# Patient Record
Sex: Female | Born: 1960 | Race: Black or African American | Hispanic: No | Marital: Single | Smoking: Never smoker
Health system: Southern US, Community
[De-identification: ages and names within clinical notes are randomized; demographics above are authoritative.]

## PROBLEM LIST (undated history)

## (undated) DIAGNOSIS — R011 Cardiac murmur, unspecified: Secondary | ICD-10-CM

## (undated) DIAGNOSIS — E785 Hyperlipidemia, unspecified: Secondary | ICD-10-CM

## (undated) DIAGNOSIS — J189 Pneumonia, unspecified organism: Secondary | ICD-10-CM

## (undated) DIAGNOSIS — I639 Cerebral infarction, unspecified: Secondary | ICD-10-CM

## (undated) DIAGNOSIS — I10 Essential (primary) hypertension: Secondary | ICD-10-CM

## (undated) DIAGNOSIS — K449 Diaphragmatic hernia without obstruction or gangrene: Secondary | ICD-10-CM

## (undated) DIAGNOSIS — K219 Gastro-esophageal reflux disease without esophagitis: Secondary | ICD-10-CM

## (undated) DIAGNOSIS — M1990 Unspecified osteoarthritis, unspecified site: Secondary | ICD-10-CM

## (undated) DIAGNOSIS — G959 Disease of spinal cord, unspecified: Secondary | ICD-10-CM

## (undated) DIAGNOSIS — G459 Transient cerebral ischemic attack, unspecified: Secondary | ICD-10-CM

## (undated) DIAGNOSIS — Z8489 Family history of other specified conditions: Secondary | ICD-10-CM

## (undated) DIAGNOSIS — E669 Obesity, unspecified: Secondary | ICD-10-CM

## (undated) DIAGNOSIS — Z8673 Personal history of transient ischemic attack (TIA), and cerebral infarction without residual deficits: Secondary | ICD-10-CM

## (undated) DIAGNOSIS — E039 Hypothyroidism, unspecified: Secondary | ICD-10-CM

## (undated) DIAGNOSIS — E282 Polycystic ovarian syndrome: Secondary | ICD-10-CM

## (undated) DIAGNOSIS — H269 Unspecified cataract: Secondary | ICD-10-CM

## (undated) DIAGNOSIS — D649 Anemia, unspecified: Secondary | ICD-10-CM

## (undated) DIAGNOSIS — F419 Anxiety disorder, unspecified: Secondary | ICD-10-CM

## (undated) HISTORY — DX: Hyperlipidemia, unspecified: E78.5

## (undated) HISTORY — DX: Cardiac murmur, unspecified: R01.1

## (undated) HISTORY — PX: DIAGNOSTIC LAPAROSCOPY: SUR761

## (undated) HISTORY — DX: Transient cerebral ischemic attack, unspecified: G45.9

## (undated) HISTORY — DX: Essential (primary) hypertension: I10

## (undated) HISTORY — PX: FRACTURE SURGERY: SHX138

## (undated) HISTORY — DX: Polycystic ovarian syndrome: E28.2

## (undated) HISTORY — DX: Personal history of transient ischemic attack (TIA), and cerebral infarction without residual deficits: Z86.73

## (undated) HISTORY — DX: Obesity, unspecified: E66.9

## (undated) HISTORY — DX: Unspecified cataract: H26.9

---
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
## 1990-12-10 DIAGNOSIS — Z8673 Personal history of transient ischemic attack (TIA), and cerebral infarction without residual deficits: Secondary | ICD-10-CM

## 1990-12-10 HISTORY — DX: Personal history of transient ischemic attack (TIA), and cerebral infarction without residual deficits: Z86.73

## 1998-01-23 ENCOUNTER — Inpatient Hospital Stay (HOSPITAL_COMMUNITY): Admission: AD | Admit: 1998-01-23 | Discharge: 1998-01-27 | Payer: Self-pay | Admitting: Obstetrics and Gynecology

## 1998-02-01 ENCOUNTER — Inpatient Hospital Stay (HOSPITAL_COMMUNITY): Admission: AD | Admit: 1998-02-01 | Discharge: 1998-02-01 | Payer: Self-pay | Admitting: Obstetrics and Gynecology

## 1998-02-07 ENCOUNTER — Inpatient Hospital Stay (HOSPITAL_COMMUNITY): Admission: AD | Admit: 1998-02-07 | Discharge: 1998-02-07 | Payer: Self-pay | Admitting: Obstetrics & Gynecology

## 1998-02-14 ENCOUNTER — Inpatient Hospital Stay (HOSPITAL_COMMUNITY): Admission: AD | Admit: 1998-02-14 | Discharge: 1998-02-17 | Payer: Self-pay | Admitting: Obstetrics and Gynecology

## 1998-02-19 ENCOUNTER — Encounter (HOSPITAL_COMMUNITY): Admission: RE | Admit: 1998-02-19 | Discharge: 1998-05-20 | Payer: Self-pay | Admitting: Obstetrics and Gynecology

## 1998-07-26 ENCOUNTER — Other Ambulatory Visit: Admission: RE | Admit: 1998-07-26 | Discharge: 1998-07-26 | Payer: Self-pay | Admitting: Obstetrics and Gynecology

## 1999-02-02 ENCOUNTER — Encounter: Admission: RE | Admit: 1999-02-02 | Discharge: 1999-05-03 | Payer: Self-pay | Admitting: Family Medicine

## 2000-12-17 ENCOUNTER — Emergency Department (HOSPITAL_COMMUNITY): Admission: EM | Admit: 2000-12-17 | Discharge: 2000-12-17 | Payer: Self-pay | Admitting: Emergency Medicine

## 2002-08-19 ENCOUNTER — Other Ambulatory Visit: Admission: RE | Admit: 2002-08-19 | Discharge: 2002-08-19 | Payer: Self-pay | Admitting: Family Medicine

## 2002-12-06 ENCOUNTER — Encounter: Payer: Self-pay | Admitting: Emergency Medicine

## 2002-12-06 ENCOUNTER — Emergency Department (HOSPITAL_COMMUNITY): Admission: EM | Admit: 2002-12-06 | Discharge: 2002-12-06 | Payer: Self-pay | Admitting: Emergency Medicine

## 2003-08-27 ENCOUNTER — Other Ambulatory Visit: Admission: RE | Admit: 2003-08-27 | Discharge: 2003-08-27 | Payer: Self-pay | Admitting: Family Medicine

## 2003-11-01 ENCOUNTER — Ambulatory Visit (HOSPITAL_BASED_OUTPATIENT_CLINIC_OR_DEPARTMENT_OTHER): Admission: RE | Admit: 2003-11-01 | Discharge: 2003-11-01 | Payer: Self-pay | Admitting: Otolaryngology

## 2003-11-01 ENCOUNTER — Encounter (INDEPENDENT_AMBULATORY_CARE_PROVIDER_SITE_OTHER): Payer: Self-pay | Admitting: *Deleted

## 2003-11-01 ENCOUNTER — Ambulatory Visit (HOSPITAL_COMMUNITY): Admission: RE | Admit: 2003-11-01 | Discharge: 2003-11-01 | Payer: Self-pay | Admitting: Otolaryngology

## 2003-12-11 HISTORY — PX: KNEE CARTILAGE SURGERY: SHX688

## 2004-05-02 ENCOUNTER — Ambulatory Visit (HOSPITAL_COMMUNITY): Admission: RE | Admit: 2004-05-02 | Discharge: 2004-05-02 | Payer: Self-pay | Admitting: Orthopedic Surgery

## 2004-07-03 ENCOUNTER — Ambulatory Visit (HOSPITAL_BASED_OUTPATIENT_CLINIC_OR_DEPARTMENT_OTHER): Admission: RE | Admit: 2004-07-03 | Discharge: 2004-07-03 | Payer: Self-pay | Admitting: Orthopedic Surgery

## 2004-10-18 ENCOUNTER — Encounter: Admission: RE | Admit: 2004-10-18 | Discharge: 2004-10-18 | Payer: Self-pay | Admitting: Family Medicine

## 2004-11-22 ENCOUNTER — Other Ambulatory Visit: Admission: RE | Admit: 2004-11-22 | Discharge: 2004-11-22 | Payer: Self-pay | Admitting: Family Medicine

## 2005-08-21 ENCOUNTER — Emergency Department (HOSPITAL_COMMUNITY): Admission: EM | Admit: 2005-08-21 | Discharge: 2005-08-21 | Payer: Self-pay | Admitting: Emergency Medicine

## 2005-08-24 ENCOUNTER — Encounter (HOSPITAL_COMMUNITY): Admission: RE | Admit: 2005-08-24 | Discharge: 2005-11-22 | Payer: Self-pay | Admitting: Family Medicine

## 2005-12-10 HISTORY — PX: TONSILLECTOMY: SUR1361

## 2006-09-29 ENCOUNTER — Emergency Department (HOSPITAL_COMMUNITY): Admission: EM | Admit: 2006-09-29 | Discharge: 2006-09-29 | Payer: Self-pay | Admitting: *Deleted

## 2006-09-30 ENCOUNTER — Ambulatory Visit (HOSPITAL_BASED_OUTPATIENT_CLINIC_OR_DEPARTMENT_OTHER): Admission: RE | Admit: 2006-09-30 | Discharge: 2006-09-30 | Payer: Self-pay | Admitting: Orthopedic Surgery

## 2006-12-10 HISTORY — PX: FEMUR FRACTURE SURGERY: SHX633

## 2007-12-11 HISTORY — PX: ABDOMINAL HYSTERECTOMY: SHX81

## 2008-02-04 ENCOUNTER — Encounter (INDEPENDENT_AMBULATORY_CARE_PROVIDER_SITE_OTHER): Payer: Self-pay | Admitting: Gynecology

## 2008-02-04 ENCOUNTER — Inpatient Hospital Stay (HOSPITAL_COMMUNITY): Admission: RE | Admit: 2008-02-04 | Discharge: 2008-02-05 | Payer: Self-pay | Admitting: Gynecology

## 2008-02-12 ENCOUNTER — Encounter: Admission: RE | Admit: 2008-02-12 | Discharge: 2008-02-12 | Payer: Self-pay | Admitting: Gynecology

## 2008-02-19 ENCOUNTER — Encounter: Admission: RE | Admit: 2008-02-19 | Discharge: 2008-02-19 | Payer: Self-pay | Admitting: Gynecology

## 2010-08-23 ENCOUNTER — Encounter: Admission: RE | Admit: 2010-08-23 | Discharge: 2010-08-23 | Payer: Self-pay | Admitting: Family Medicine

## 2010-12-31 ENCOUNTER — Encounter: Payer: Self-pay | Admitting: Family Medicine

## 2011-02-09 ENCOUNTER — Ambulatory Visit (INDEPENDENT_AMBULATORY_CARE_PROVIDER_SITE_OTHER): Payer: Medicare FFS | Admitting: Cardiology

## 2011-02-09 DIAGNOSIS — I119 Hypertensive heart disease without heart failure: Secondary | ICD-10-CM

## 2011-02-09 DIAGNOSIS — E7800 Pure hypercholesterolemia, unspecified: Secondary | ICD-10-CM

## 2011-02-09 DIAGNOSIS — R079 Chest pain, unspecified: Secondary | ICD-10-CM

## 2011-02-20 ENCOUNTER — Encounter: Payer: Self-pay | Admitting: Cardiology

## 2011-02-20 ENCOUNTER — Ambulatory Visit (HOSPITAL_COMMUNITY): Payer: Managed Care, Other (non HMO) | Attending: Cardiovascular Disease

## 2011-02-20 ENCOUNTER — Telehealth (INDEPENDENT_AMBULATORY_CARE_PROVIDER_SITE_OTHER): Payer: Self-pay | Admitting: *Deleted

## 2011-02-20 DIAGNOSIS — R072 Precordial pain: Secondary | ICD-10-CM

## 2011-02-20 DIAGNOSIS — E785 Hyperlipidemia, unspecified: Secondary | ICD-10-CM | POA: Insufficient documentation

## 2011-02-20 DIAGNOSIS — R079 Chest pain, unspecified: Secondary | ICD-10-CM | POA: Insufficient documentation

## 2011-02-20 DIAGNOSIS — E039 Hypothyroidism, unspecified: Secondary | ICD-10-CM | POA: Insufficient documentation

## 2011-02-20 DIAGNOSIS — I10 Essential (primary) hypertension: Secondary | ICD-10-CM | POA: Insufficient documentation

## 2011-02-20 DIAGNOSIS — E669 Obesity, unspecified: Secondary | ICD-10-CM | POA: Insufficient documentation

## 2011-02-21 ENCOUNTER — Encounter: Payer: Self-pay | Admitting: Cardiology

## 2011-02-21 ENCOUNTER — Ambulatory Visit (HOSPITAL_COMMUNITY): Payer: Managed Care, Other (non HMO) | Attending: Cardiology

## 2011-02-21 DIAGNOSIS — R079 Chest pain, unspecified: Secondary | ICD-10-CM | POA: Insufficient documentation

## 2011-02-21 DIAGNOSIS — R0789 Other chest pain: Secondary | ICD-10-CM

## 2011-02-21 DIAGNOSIS — R0609 Other forms of dyspnea: Secondary | ICD-10-CM

## 2011-02-27 NOTE — Progress Notes (Signed)
Summary: Nuclear Pre-Procedure  Phone Note Outgoing Call Call back at [facility] Phone [phone]   Call placed by: Stanton Kidney, EMT-P,  February 20, 2011 3:19 PM Action Taken: Phone Call Completed Summary of Call: Left message with information on Myov[employer] Information Sheet (see scanned document for details). Stanton Kidney, EMT-P  February 20, 2011 3:19 PM      Nuclear Med Background Indications for Stress Test: Evaluation for Ischemia   History: Asthma, Myocardial Perfusion Study  History Comments: 10/06 MPS: NL, EF= 62%  Symptoms: Chest Pressure, Chest Tightness    Nuclear Pre-Procedure Cardiac Risk Factors: Family History - CAD, Hypertension, Lipids, Obesity

## 2011-02-28 ENCOUNTER — Ambulatory Visit (HOSPITAL_COMMUNITY): Payer: Managed Care, Other (non HMO) | Attending: Cardiology | Admitting: Radiology

## 2011-02-28 DIAGNOSIS — R0789 Other chest pain: Secondary | ICD-10-CM

## 2011-03-07 ENCOUNTER — Telehealth: Payer: Self-pay | Admitting: Cardiology

## 2011-03-07 NOTE — Telephone Encounter (Signed)
RETURNING YOUR CALL AND STILL HAS QUESTIONS, ALSO WANTS A COPY OF THE REPORT

## 2011-03-07 NOTE — Telephone Encounter (Signed)
Returned call and advised of results 

## 2011-03-08 NOTE — Assessment & Plan Note (Signed)
Summary: Cardiology Nuclear Testing  Nuclear Med Background Indications for Stress Test: Evaluation for Ischemia   History: Asthma, Myocardial Perfusion Study  History Comments: 10/06 MPS: NL, EF= 62%  Symptoms: Chest Pain, Chest Pain with Exertion, Chest Pressure, Chest Tightness, Chest Tightness with Exertion, Diaphoresis, DOE, Fatigue, Fatigue with Exertion, Light-Headedness, Palpitations  Symptoms Comments: Continuous chest pain left upper chest x 1 month, mild per patient.   Nuclear Pre-Procedure Cardiac Risk Factors: Family History - CAD, Hypertension, Lipids, Obesity, TIA Caffeine/Decaff Intake: None NPO After: 8:00 AM Lungs: Clear IV 0.9% NS with Angio Cath: 22g     IV Site: R Hand IV Started by: Irean Hong, RN Chest Size (in) 40     Cup Size C     Height (in): 62 Weight (lb): 242 BMI: 44.42  Nuclear Med Study 1 or 2 day study:  2 day     Stress Test Type:  Treadmill/Lexiscan Reading MD:  Willa Rough, MD     Referring MD:  Cassell Clement Resting Radionuclide:  Technetium 89m Tetrofosmin     Resting Radionuclide Dose:  33 mCi  Stress Radionuclide:  Technetium 34m Tetrofosmin     Stress Radionuclide Dose:  33 mCi   Stress Protocol      Max HR:  126 bpm     Predicted Max HR:  172 bpm  Max Systolic BP: 136 mm Hg     Percent Max HR:  73.26 %Rate Pressure Product:  16109  Lexiscan: 0.4 mg   Stress Test Technologist:  Irean Hong,  RN     Nuclear Technologist:  Doyne Keel, CNMT  Rest Procedure  Myocardial perfusion imaging was performed at rest 45 minutes following the intravenous administration of Technetium 58m Tetrofosmin.  Stress Procedure  The patient received IV Lexiscan 0.4 mg over 15-seconds with concurrent low level exercise and then Technetium 48m Tetrofosmin was injected at 30-seconds while the patient continued walking one more minute.  The EKG was nondiagnostic due to baseline T wave changes. The patient has been having continuous left upper chest  pain x 1 month. Quantitative spect images were obtained after a 45 minute delay.  QPS Raw Data Images:  Patient motion noted; appropriate software correction applied. Stress Images:  Normal homogeneous uptake in all areas of the myocardium. Rest Images:  Normal homogeneous uptake in all areas of the myocardium. Subtraction (SDS):  No evidence of ischemia. Transient Ischemic Dilatation:  1.05  (Normal <1.22)  Lung/Heart Ratio:  0.42  (Normal <0.45)  Quantitative Gated Spect Images QGS EDV:  82 ml QGS ESV:  28 ml QGS EF:  66 % QGS cine images:  Normal motion  Findings Normal nuclear study      Overall Impression  Exercise Capacity: Lexiscan with low level exercise BP Response: Normal blood pressure response. Clinical Symptoms: blurred vision ECG Impression: No significant ST segment change suggestive of ischemia. Overall Impression: Normal stress nuclear study.

## 2011-03-28 ENCOUNTER — Encounter: Payer: Managed Care, Other (non HMO) | Attending: Family Medicine | Admitting: *Deleted

## 2011-03-28 DIAGNOSIS — Z713 Dietary counseling and surveillance: Secondary | ICD-10-CM | POA: Insufficient documentation

## 2011-03-28 DIAGNOSIS — E669 Obesity, unspecified: Secondary | ICD-10-CM | POA: Insufficient documentation

## 2011-04-10 ENCOUNTER — Ambulatory Visit: Payer: Managed Care, Other (non HMO) | Admitting: *Deleted

## 2011-04-24 NOTE — H&P (Signed)
[name], [name] NO.:  [id number]   MEDICAL RECORD NO.:  [id number]          PATIENT TYPE:  INP   LOCATION:  0007                         FACILITY:  [facility]   PHYSICIAN:  Gretta Cool, M.D. DATE OF BIRTH:  03/08/1961   DATE OF ADMISSION:  02/04/2008  DATE OF DISCHARGE:                              HISTORY & PHYSICAL   CHIEF COMPLAINT:  Abnormal bleeding secondary to fibroids.   HISTORY OF PRESENT ILLNESS:  A 50 year old gravida 4, para 1, AB 3 with  a history of one Cesarean section delivery. Over the last six months she  has had increasingly severe abnormal uterine bleeding. Her cycles are  occurring every two to three weeks and lasting seven days. The second  and third days are extremely heavy, changing every hour, pads and  tampons to control. She has had progressive anemia, has difficulty  keeping up with her blood count from blood loss. On ultrasound exam, she  has enormous uterine leiomyomata involving the endometrial cavity  greatly distorting the endometrium. The process is sufficient to prevent  consideration of conservative measures such as hysteroscopy. She is now  admitted for definitive therapy by supercervical hysterectomy. She has  current Pap within normal limits.   PAST MEDICAL HISTORY:  She has a life-long history of obesity with BMI  now greater than 45. She has a remote history of mitral prolapse and  history of some event thought possibly to represent a mini-stroke when  she was on hormonal birth control. She has had testing for coagulopathy  with no abnormal findings. She has metabolic syndrome and PCO and has a  well-documented impaired glucose tolerance with elevated A1C. She is  followed by Dr. Talmage Nap.   She has previous surgery with T and A in 2004, knee surgery 2005,  history of Cesarean section delivery with her only child 1998 by Dr.  Stefano Gaul.   She has allergies to ASPIRIN and PENICILLIN.   CURRENT MEDICATIONS:  1.  Lipitor 10 mg.  2. Synthroid 0.125 mg daily.  3. Paxil 20 mg daily.  4. HCTZ 25 q.o.d.  5. Albuterol p.r.n.   She denies ethanol or tobacco, denies recreational drugs.   REVIEW OF SYMPTOMS:  HEENT:  Denies symptoms. CARDIORESPIRATORY:  Denies  asthma. Has occasional episodes of asthma and bronchospasm on Albuterol.  GI/GU:  Denies frequency, urgency, dysuria, change in bowel habits, food  intolerance.   PHYSICAL EXAMINATION:  GENERAL:  A massively obese short black female.  HEENT:  Pupils equal, reactive to light and accommodate. Fundi not  examined. Oropharynx clear.  NECK:  Supple without mass, thyroid enlargement.  CHEST:  Clear P to A.  HEART:  Regular rhythm with audible mitral snap and mitral murmur.  BREASTS:  Without masses, nodes, nipple discharge.  ABDOMEN:  Protuberant with large panniculus with very high abdomen to  hip ratio.  PELVIC:  External genitalia normal female. Vagina clean, rugose. Cervix  is nulliparous, clean. Uterus is approximately 12 weeks size. Adnexa  clear.  RECTOVAGINAL:  Confirms.  EXTREMITIES:  Negative.  NEUROLOGIC:  Physiologic.   IMPRESSIONS:  1. Uterine leiomyomata with abnormal uterine bleeding and anemia,      unresponsive to conservative therapy.  2. Polycystic ovaries (PCO) with impaired glucose tolerance, metabolic      syndrome.  3. Hypothyroid autoimmune.  4. History of mitral valve prolapse.  5. Morbid obesity, obesity 3.  6. History of Cesarean section delivery with pregnancy-induced      hypertension by Dr. Stefano Gaul.   PLAN:  Supercervical hysterectomy, possible salpingo-oophorectomy.           ______________________________  Gretta Cool, M.D.     CWL/MEDQ  D:  02/03/2008  T:  02/04/2008  Job:  [id number]   cc:   Dorisann Frames, M.D.  Fax: [phone]   Talmadge Coventry, M.D.  Fax: [phone]

## 2011-04-24 NOTE — Op Note (Signed)
Alison [name], Alison [name] NO.:  [id number]   MEDICAL RECORD NO.:  [id number]          PATIENT TYPE:  INP   LOCATION:  1527                         FACILITY:  [facility]   PHYSICIAN:  Gretta Cool, M.D. DATE OF BIRTH:  1961/01/18   DATE OF PROCEDURE:  02/04/2008  DATE OF DISCHARGE:                               OPERATIVE REPORT   PREOPERATIVE DIAGNOSES:  1. Abnormal uterine bleeding, increasingly severe with anemia and      uterine enlargement compatible with fibroids versus adenomyosis.  2. BMI 45 with morbid obesity.  3. Previous cesarean section.   POSTOPERATIVE DIAGNOSES:  1. Abnormal uterine bleeding, increasingly severe with anemia and      uterine enlargement compatible with fibroids versus adenomyosis.  2. BMI 45 with morbid obesity.  3. Previous cesarean section.   PROCEDURE:  Laparotomy, lysis of extensive adhesions, supracervical  hysterectomy.   SURGEON:  Beather Arbour, M.D.   ASSISTANT:  Dr. Jeanine Luz.   ANESTHESIA:  General orotracheal.   DESCRIPTION OF PROCEDURE:  Under excellent anesthesia as above with the  patient prepped and draped in lithotomy position, a vertical skin  incision was made above the abdominal panniculus roll.  She has a 6 cm  deep apron of fat over the previous Pfannenstiel incision making an  approach in that same incision and extremely risk one.  She had a  vertical incision well above the occlusive panniculus.  The incision was  extended through the subcutaneous tissue to the fascia.  The fascia was  then opened in the midline and the peritoneum was then opened and the  abdomen explored.  There was a large amount of adhesion of omentum to  the anterior abdominal wall.  Omental adhesions were lysed by blunt and  sharp dissection.  Once the pelvis could be identified and the pelvic  organs mobilized, significant adhesions were noted of the bladder to the  anterior peritoneal surface.  Those adhesions were lysed.  At this  point  retractors were placed and the bowel was packed away.  With the patient  in deep Trendelenburg position, the uterus was elevated with Kelly  clamps across the adnexal structures.  The round ligaments were then  transected and the bladder flap opened and the bladder pushed off the  lower uterine segment.  The ovarian ligaments were then clamped on each  side with Masterson clamps, cut, sutured and ligated with 0-0 Vicryl.  They were then sutured with 0-0 Vicryl.  At this point the uterine  vessels were skeletonized and clamped.  The size of the uterus made and  further surgical efforts difficult because of the obscuring uterine  fundus.  The uterus was morcellated and the upper portion of the uterus  removed to allow adequate visualization.  At this point the uterine  vessels were clamped, cut, sutured and tied with 0-0 Vicryl.  The upper  portion of the cardinal ligaments was then also clamped, cut, sutured  and tied with 0-0 Vicryl.  The cervix was then excised in conical  fashion so as to remove the endocervical canal tissue  and eliminate any  residual endometrial tissue that might cause persistence of bleeding.  At this point the cervix was approximated with running suture of 0-0  Vicryl from each angle transversely.  The pelvic floor was then  reperitonealized with running suture of #2-0 Monocryl.  At this point  the pelvis was irrigated and all debris removed.  The packs and  retractors removed.  The abdominal peritoneum was then closed with  running suture of #0-0 PDS.  The fascia was then approximated with a  running mattress suture of 0-0 PDS from each angle and tied to the like  kind suture in the midline.  At this point the subcutaneous tissue was  approximated at [facility] fascia and the skin closed with skin staples and  Steri-Strips.  At the end of the procedure, sponge and lap counts were  correct.  There were no complications.  Patient returned to recovery  room in  excellent condition.           ______________________________  Gretta Cool, M.D.     CWL/MEDQ  D:  02/04/2008  T:  02/05/2008  Job:  [id number]   cc:   Almedia Balls. Randell Patient, M.D.  Fax: [phone]   Talmadge Coventry, M.D.  Fax: [phone]   Dorisann Frames, M.D.  Fax: [phone]

## 2011-04-26 ENCOUNTER — Ambulatory Visit: Payer: Managed Care, Other (non HMO) | Admitting: *Deleted

## 2011-04-27 NOTE — Discharge Summary (Signed)
NAMEDENELL, COTHERN NO.:  [id number]   MEDICAL RECORD NO.:  [id number]          PATIENT TYPE:  INP   LOCATION:  1527                         [facility]AC[facility]:  [facility]   PHYSICIAN:  Gretta Cool, M.D. DATE OF BIRTH:  1961-07-18   DATE OF ADMISSION:  02/04/2008  DATE OF DISCHARGE:  02/05/2008                               DISCHARGE SUMMARY   HISTORY OF PRESENT ILLNESS:  Ms. Pryce is a 50 year old gravida 4, para  1, AB 3 female who has a history of one cesarean section delivery.  She  reports that over the past 6 months she has had increasingly severe  abnormal uterine bleeding with her cycles occurring every 2-3 weeks  lasting 7 days.  She describes the bleeding as heavy, especially on the  second and third days, changing tampons or pads every hour.  She has had  progressive anemia.  On ultrasound examination, she has large uterine  leiomyomata involving the endometrial cavity which distorts the  endometrium.  She is now admitted for definitive therapy by  supracervical hysterectomy.  Past medical history is significant for  obesity with BMI greater than 45.  She has a remote history of mitral  valve prolapse and thought possibly to represent a mini stroke when on  hormonal birth control.  Coagulopathy testing found no abnormal results.  She has metabolic syndrome and PCO.  She has well-documented impaired  glucose tolerance with an elevated hemoglobin A1c.  She sees Dr. Talmage Nap  for this diagnosis.   PAST SURGERIES:  Include tonsillectomy and adenoidectomy in 2002, knee  surgery in 2005, the C-section delivery of her only child in 1998.   ALLERGIES:  To ASPIRIN and PENICILLIN.   MEDICATIONS:  See history and physical for current medications.   ADMISSION EXAM:  Massive obesity.  CHEST: Clear to A and P.  HEART:  Rate and rhythm were regular with no audible mitral snap and  mitral murmur.  BREASTS:  Bilaterally soft without masses, nodes, or nipple discharge.  ABDOMEN:  Large, panniculus with high abdomen to hip ratio.  PELVIC:  External genitalia within normal limits for female.  Vagina is  clean and rugose. Cervix is nulliparous and clean.  The uterus is  approximately 12 weeks in size.  Adnexa clear.  Rectovaginal exam  confirms.   IMPRESSION:  1. Uterine leiomyomata with abnormal uterine bleeding and anemia      unresponsive to conservative therapy.  2. Polycystic ovary with impaired glucose tolerance, metabolic      syndrome.  3. Hypothyroid autoimmune.  4. History of mitral valve prolapse.  5. Morbid obesity.  6. History of cesarean section with pregnancy-induced hypertension.   PLAN:  Supracervical hysterectomy and possible salpingo-oophorectomy.  Risks and benefits have been discussed with the patient, and she accepts  these procedures.   LABORATORY DATA:  Admission hemoglobin was 11.5, hematocrit 38.8. On the  first postoperative day hemoglobin was 11.5, hematocrit 33.6. Routine  chemistries on admission her sodium was slightly 133 and glucose  elevated at 104. ECG showed nonspecific T-wave abnormality but normal  sinus rhythm. Chest  x-ray showed mild cardiomegaly which was a new  finding and probable enlarging sclerotic lesion of the right medial  clavicle versus a right upper lobe lesion superimposed over the  clavicle. Also low volume lungs, mild right base atelectasis, pulmonary  vascular congestion.   HOSPITAL COURSE:  The patient underwent laparotomy, lysis of extensive  adhesions and a supracervical hysterectomy under general anesthesia. The  procedures were completed without any complications, and the patient was  returned to the recovery room in excellent condition.  Her postoperative  course was without complications, and she was discharged on the first  postoperative day in excellent condition.   Final discharge instructions include no heavy lifting or straining, no  vaginal entrance, and to increase ambulation as  tolerated.  She is to  call for any fever of over 100.5 or failure of daily improvement.  She  is to return to the office 4 days later for her staple removal.   FINAL DISCHARGE DIAGNOSES:  1. Abnormal uterine bleeding with increasingly severe anemia and      uterine enlargement compatible with fibroids. 2.  Body mass index      of greater than 45 with morbid obesity.  2. Previous cesarean section delivery.   PROCEDURES PERFORMED:  Laparotomy, lysis of extensive adhesions and  supracervical hysterectomy under general anesthesia.   ADDITIONAL LABORATORY DATA:  Her pathology report showed endometrium,  benign secretory myometrium adenomyosis, and leiomyomata  and serosa  fibrous adhesions.      Matt Holmes, N.P.    ______________________________  Gretta Cool, M.D.    EMK/MEDQ  D:  04/12/2008  T:  04/12/2008  Job:  [id number]

## 2011-04-27 NOTE — Op Note (Signed)
NAMEJESSIA, Alison [name]                  ACCOUNT NO.:  [id number]   MEDICAL RECORD NO.:  [id number]          PATIENT TYPE:  AMB   LOCATION:  DSC                          FACILITY:  [facility]   PHYSICIAN:  Artist Pais. Weingold, M.D.DATE OF BIRTH:  09/21/1961   DATE OF PROCEDURE:  09/30/2006  DATE OF DISCHARGE:                                 OPERATIVE REPORT   PREOPERATIVE DIAGNOSIS:  Left Galeazzi fracture dislocation.   POSTOPERATIVE DIAGNOSIS:  Left Galeazzi fracture dislocation.   PROCEDURE:  Open reduction and internal fixation of above with closed  treatment of distal radial and joint dislocation.   SURGEON:  Artist Pais. Mina Marble, M.D.   ASSISTANT:  None.   ANESTHESIA:  Block analgesia.   TOURNIQUET TIME:  48 minutes.   COMPLICATIONS:  None.   DRAINS:  None.   OPERATIVE REPORT:  The patient was taken to the operating room.  After  induction of adequate block analgesia, the left upper extremity was prepped  and draped in the usual sterile fashion.  Esmarch was used to exsanguinate  the limb.  Tourniquet was then inflated to 250 mmHg.  At this point in time  an incision was made over the flexor carpi radialis tendon in the lower  aspect of the left forearm.  The skin was incised to 10 cm.  The interval  between the flexor carpi radialis and the radial artery was identified and  split.  The fascia underlying this was incised.  Dissection was carried down  to the pronator quadratus.  The fascia was identified 7 cm proximal to the  radial carpal joint.  The fracture site was cleared of debris.  Reduction  was performed.  A single 3.5 mm lag screw was placed on the radial shaft of  the radius from distal to proximal, perpendicular to the fracture line.  Once this was done, a standard compression plate, low profile, was contoured  to the distal aspect of the radius and was then placed using 6 cortical  screws, 3 proximal, 3 distal to the fracture site, under direct and  fluoroscopic  guidance.  Visualization revealed anatomic reduction of the  radius.  The distal radial joint was carefully reduced with post supination  and remained stable in supination, slight unstable toward pronation.  Patient was then irrigated, loosely closed with undyed 2-0 Vicryl and a 3-0  Prolene subcuticular stitch on the skin.  Steri-Strips, 4 x 4's, fluffs, and  compressive dressing were  applied.  Patient was placed in a supination splint, the elbow flexed at 90  degrees, forearm in full supination, and the wrist immobilized in slight  extension.  Patient tolerated the procedure well, and went to the recovery  room in stable fashion.      Artist Pais Mina Marble, M.D.  Electronically Signed     MAW/MEDQ  D:  09/30/2006  T:  09/30/2006  Job:  [id number]

## 2011-04-27 NOTE — Op Note (Signed)
NAME:  Alison [name], Alison [name]                            ACCOUNT NO.:  [id number]   MEDICAL RECORD NO.:  [id number]                   PATIENT TYPE:  AMB   LOCATION:  DSC                                  FACILITY:  [facility]   PHYSICIAN:  Kinnie Scales. Annalee Genta, M.D.            DATE OF BIRTH:  [birth year]   DATE OF PROCEDURE:  11/01/2003  DATE OF DISCHARGE:                                 OPERATIVE REPORT   PREOPERATIVE DIAGNOSIS:  1. Recurrent acute tonsillitis.  2. Tonsillar hypertrophy.  3. Uvulopalatal hypertrophy.   POSTOPERATIVE DIAGNOSIS:  1. Recurrent acute tonsillitis.  2. Tonsillar hypertrophy.  3. Uvulopalatal hypertrophy.   OPERATION PERFORMED:  1. Tonsillectomy.  2. Limited uvulectomy.   SURGEON:  Kinnie Scales. Annalee Genta, M.D.   ANESTHESIA:  General endotracheal.   COMPLICATIONS:  None.   ESTIMATED BLOOD LOSS:  Less than 50 mL.   DISPOSITION:  The patient was then transferred from the operating room to  the recovery room in stable condition.   INDICATIONS FOR PROCEDURE:  Alison [name] is a 50 year old black female who was  referred for evaluation of recurrent tonsillitis, adenotonsillar enlargement  and night time snoring.  Evaluation in the office revealed no history of  obstructive sleep apnea.  The patient had ongoing recurrent tonsillitis  requiring three to five courses of antibiotic therapy per year.  Examination  in the office revealed tonsillar hypertrophy with chronic cryptic tonsillar  changes, no evidence of acute infection. The patient also had significant  uvular and palatal hypertrophy.  Given the patient's history, examination  and findings, she was scheduled for tonsillectomy and possible  adenoidectomy.  The risks, benefits and possible complications of these  procedures were discussed in detail with the patient who understood and  concurred with our plan for surgery.   DESCRIPTION OF PROCEDURE:  The patient was brought to the operating room on  November 01, 2003  and placed in supine position on the operating table.  General endotracheal anesthesia was established without difficulty.  The  patient was adequately anesthetized.  The oral cavity and oropharynx were  examined.  There were no loose or broken teeth.  The hard and soft palate  were intact.  The Crowe-Davis mouth gag was inserted without difficulty.  The patient's posterior nasopharynx was examined.  There was no significant  adenoidal tissue and an adenoidectomy was not performed.  A tonsillectomy  was then undertaken beginning with Harmonic scalpel and dissecting  subcapsular fascia along the patient's left hand side, the entire left  tonsil was removed from superior pole to tongue base.  The right hand tonsil  was removed in similar fashion.  Tonsillar tissue was sent to pathology for  gross and microscopic evaluation.  The patient's oral cavity and oropharynx  were then irrigated and suctioned.  A dry tonsillar sponge to gently abrade  the tonsillar fossae bilaterally and several areas of point hemorrhage were  then  cauterized.  Examination of the patient's oral cavity and oropharynx  revealed significant uvular hypertrophy.  A limited uvulectomy was then  undertaken using the Harmonic scalpel and dissecting along the uvular  attachment of the palate.  The majority of the uvula was then removed.  The  overlying mucosa was preserved and was reapproximated with a 2-0 chromic  suture on a tapered needle.  The patient's oral cavity and oropharynx were  irrigated and suctioned.  The orogastric tube was passed and stomach  contents were aspirated.  Crowe-Davis mouth gag was released and then  reapplied.  There was no active bleeding.  The patient was awakened from her  anesthetic.  She was extubated and was then transferred from the operating  room to recovery room in stable condition.                                               Kinnie Scales. Annalee Genta, M.D.    DLS/MEDQ  D:  [id number]  T:   11/01/2003  Job:  [id number]

## 2011-04-27 NOTE — Op Note (Signed)
NAME:  Alison [name], Alison [name]                            ACCOUNT NO.:  [id number]   MEDICAL RECORD NO.:  [id number]                   PATIENT TYPE:  AMB   LOCATION:  [facility]                                  FACILITY:  [facility]   PHYSICIAN:  Robert A. Thurston Hole, M.D.              DATE OF BIRTH:  1961-04-24   DATE OF PROCEDURE:  07/03/2004  DATE OF DISCHARGE:                                 OPERATIVE REPORT   PREOPERATIVE DIAGNOSES:  1. Left knee possible acromioclavicular deficiency.  2. Left knee lateral meniscus tear.  3. 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   POSTOPERATIVE DIAGNOSES:  1. Left knee lateral meniscus tear.  2. 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   PROCEDURE:  1. Left knee examination under anesthesia, followed by arthroscopic partial     lateral meniscectomy.  2. Left knee patellofemoral chondroplasty with lateral retinacular release.  3. Left knee partial synovectomy.   SURGEON:  Elana Alm. Thurston Hole, M.D.   ASSISTANT:  Julien Girt, P.A.   ANESTHESIA:  General.   OPERATIVE TIME:  Was 45 minutes.   COMPLICATIONS:  None.   INDICATIONS FOR PROCEDURE:  The patient is a 50 year old woman who has had  significant left knee pain for over one year, with examination consistent  with possible ACL deficiency; however, an MRI has shown that her ACL has  been intact, but she feels unstable on her knee.  Also patellofemoral  chondromalacia and questionable meniscal tear.  She has failed conservative  care, and is now to undergo an arthroscopy and possible ACL reconstruction,  if in fact she is found to have an ACL deficient knee.   DESCRIPTION OF PROCEDURE:  The patient is brought to the operating room on  July 03, 2004, after a femoral nerve block had been placed in the holding  room.  She was placed on the operating room table in the supine position.  Her left knee was examined under anesthesia.   She had a full range of  motion.  She had a 1+ Lachman with a firm end-point and negative pivot  shift.  The knee was stable to varus and valgus and posterior stress with  lateral patellar tracking noted.  The knee was sterilely injected with 0.25%  Marcaine with epinephrine.  She received Ancef 1 g IV preoperatively for  prophylaxis.  The left leg was then prepped using sterile DuraPrep and  draped using a sterile technique.  Originally through an anterior lateral  portal the arthroscope with a pump attached was placed, and through an  anterior medial portal an arthroscopic probe was placed.  On initial  inspection of the medial compartment,  the articular cartilage was normal.  The medial meniscus was normal.  The ACL was completely intact with only 1  to 2 mm of anterior laxity with a firm end-point.  The posterior cruciate  was intact.  The lateral compartment was inspected.  She had 25% grade 3  chondromalacia which was debrided.  She had a small tear of the lateral  meniscus, posterior and lateral horn 25%, which was resected back to a  stable rim.  The patellofemoral joint showed 25%-30% grade 3 chondromalacia  which was debrided.  She had excessive lateral patellar tracking noted,  which was amenable to a lateral retinacular release.  Using the ArthroCare  wand under arthroscopic visualization, the lateral release was carried out.  No excessive bleeding was encountered.  This significantly improved the  patellar tracking and decompressed the patellofemoral joint.  Moderate  synovitis in the medial and lateral gutters was also debrided.  Otherwise  they are free of pathology.  After this was done, it was felt that all  pathology had been satisfactorily addressed.  The instruments were removed.  The portal was closed with #3-0 nylon suture and injected with 0.25%  Marcaine with epinephrine.  Sterile dressings were applied.  The patient was awakened and taken to the recovery room in  stable condition.   FOLLOW-UP CARE:  The patient will be followed as an outpatient on Percocet  for pain.  I will see her back in the office in one week for sutures out and  followup.                                               Robert A. Thurston Hole, M.D.    RAW/MEDQ  D:  07/03/2004  T:  07/03/2004  Job:  [id number]

## 2011-05-10 ENCOUNTER — Encounter: Payer: Managed Care, Other (non HMO) | Attending: Family Medicine | Admitting: *Deleted

## 2011-05-10 DIAGNOSIS — E669 Obesity, unspecified: Secondary | ICD-10-CM | POA: Insufficient documentation

## 2011-05-10 DIAGNOSIS — Z713 Dietary counseling and surveillance: Secondary | ICD-10-CM | POA: Insufficient documentation

## 2011-06-27 ENCOUNTER — Encounter: Payer: Managed Care, Other (non HMO) | Attending: Family Medicine | Admitting: *Deleted

## 2011-06-27 ENCOUNTER — Encounter: Payer: Self-pay | Admitting: *Deleted

## 2011-06-27 DIAGNOSIS — E669 Obesity, unspecified: Secondary | ICD-10-CM | POA: Insufficient documentation

## 2011-06-27 DIAGNOSIS — Z713 Dietary counseling and surveillance: Secondary | ICD-10-CM | POA: Insufficient documentation

## 2011-06-27 DIAGNOSIS — I10 Essential (primary) hypertension: Secondary | ICD-10-CM

## 2011-06-27 DIAGNOSIS — E039 Hypothyroidism, unspecified: Secondary | ICD-10-CM

## 2011-06-27 DIAGNOSIS — J45909 Unspecified asthma, uncomplicated: Secondary | ICD-10-CM | POA: Insufficient documentation

## 2011-06-27 DIAGNOSIS — E7800 Pure hypercholesterolemia, unspecified: Secondary | ICD-10-CM

## 2011-06-27 NOTE — Patient Instructions (Signed)
   Follow "Modified" Pre-Operative Bariatric Surgery diet  1500 kcals, 100g protein, 60g fat, <150g carbphydrate  Follow "Pre-Op" nutrition goals for surgery  Follow up with bariatric surgeons

## 2011-06-27 NOTE — Progress Notes (Signed)
  Follow-up visit:  Pre-Operative "Prospective" Gastric Sleeve Surgery  Medical Nutrition Therapy:  Appt start time: 0730 end time:  0800.  Assessment:  Primary concerns today: pre-operative bariatric surgery nutrition counseling. Pt was seen today for her 3rd month of supervised weight loss at the [facility]. Pt has been considering surgery for >2 years and desires to have a Gastric Sleeve procedure. She has been working with nutrition to meet insurance requirements for surgery and plans to discuss this procedure with the [facility] bariatric surgery coordinator.  3 months of documented supervised weight loss sent to [facility] bariatric surgery coordinator on 06/27/11.  Progress Towards Goal(s):  In progress.   Nutritional Diagnosis:  [place]-3.3 Overweight/obesity As related to poor dietary habits.  As evidenced by pt with a BMI of 45.3% and inability to lose significant amounts of weight.    Intervention:   Follow "Modified" Pre-Operative Bariatric Surgery diet  1500 kcals, 100g protein, 60g fat, <150g carbphydrate  Follow "Pre-Op" nutrition goals for surgery  Follow up with bariatric surgeons  Monitoring/Evaluation:  Dietary intake, exercise, surgical process, and body weight. Follow up for pre-op nutrition.

## 2011-07-27 ENCOUNTER — Other Ambulatory Visit (INDEPENDENT_AMBULATORY_CARE_PROVIDER_SITE_OTHER): Payer: Self-pay | Admitting: General Surgery

## 2011-07-27 ENCOUNTER — Ambulatory Visit (INDEPENDENT_AMBULATORY_CARE_PROVIDER_SITE_OTHER): Payer: Managed Care, Other (non HMO) | Admitting: General Surgery

## 2011-07-27 ENCOUNTER — Encounter (INDEPENDENT_AMBULATORY_CARE_PROVIDER_SITE_OTHER): Payer: Self-pay | Admitting: General Surgery

## 2011-07-27 LAB — CBC WITH DIFFERENTIAL/PLATELET
Basophils Absolute: 0 10*3/uL (ref 0.0–0.1)
Basophils Relative: 0 % (ref 0–1)
Eosinophils Absolute: 0.2 10*3/uL (ref 0.0–0.7)
Eosinophils Relative: 2 % (ref 0–5)
HCT: 41.9 % (ref 36.0–46.0)
Hemoglobin: 14.7 g/dL (ref 12.0–15.0)
Lymphocytes Relative: 39 % (ref 12–46)
Lymphs Abs: 3.3 10*3/uL (ref 0.7–4.0)
MCH: 30.4 pg (ref 26.0–34.0)
MCHC: 35.1 g/dL (ref 30.0–36.0)
MCV: 86.6 fL (ref 78.0–100.0)
Monocytes Absolute: 0.5 10*3/uL (ref 0.1–1.0)
Monocytes Relative: 6 % (ref 3–12)
Neutro Abs: 4.3 10*3/uL (ref 1.7–7.7)
Neutrophils Relative %: 52 % (ref 43–77)
Platelets: 234 10*3/uL (ref 150–400)
RBC: 4.84 MIL/uL (ref 3.87–5.11)
RDW: 14.3 % (ref 11.5–15.5)
WBC: 8.3 10*3/uL (ref 4.0–10.5)

## 2011-07-27 LAB — COMPREHENSIVE METABOLIC PANEL
ALT: 20 U/L (ref 0–35)
AST: 18 U/L (ref 0–37)
Albumin: 3.9 g/dL (ref 3.5–5.2)
Alkaline Phosphatase: 104 U/L (ref 39–117)
BUN: 9 mg/dL (ref 6–23)
CO2: 25 mEq/L (ref 19–32)
Calcium: 9.4 mg/dL (ref 8.4–10.5)
Chloride: 103 mEq/L (ref 96–112)
Creat: 0.71 mg/dL (ref 0.50–1.10)
Glucose, Bld: 66 mg/dL — ABNORMAL LOW (ref 70–99)
Potassium: 4.2 mEq/L (ref 3.5–5.3)
Sodium: 139 mEq/L (ref 135–145)
Total Bilirubin: 0.4 mg/dL (ref 0.3–1.2)
Total Protein: 6.6 g/dL (ref 6.0–8.3)

## 2011-07-27 LAB — TSH: TSH: 0.415 u[IU]/mL (ref 0.350–4.500)

## 2011-07-27 LAB — T4: T4, Total: 8.9 ug/dL (ref 5.0–12.5)

## 2011-07-27 NOTE — Progress Notes (Signed)
Chief Complaint  Patient presents with  . Weight Loss Surgery    Bariatric Initial -- Gastric Sleeve    HPI Alison [name] is a 50 y.o. female.  This patient is a 50 year old female with a BMI of 46 and a past medical history significant for hypertension, hyperlipidemia, "borderline" type 2 diabetes, polycystic ovarian syndrome, asthma, hypothyroidism, and a heart murmur who is t here for her initial bariatric consultation. She states that she has struggled with her weight "all my life". She has tried multiple diets, again she says "I have tried them all". She had a low success with the Fen/Phen diet which she performed for 4 months. Though she had trouble with her weight all of her life, the greatest weight gain has been over the last 13 years since the birth of her baby. Her weight has been difficult to get off as compared to before. She does have a sedentary lifestyle working at home. Which leads to some bad eating habits. She does state that she walks 3 times a week but realizes that she needs to increase to pace and distance for additional success.  She denies any history of reflux  HPI  Past Medical History  Diagnosis Date  . Hyperlipidemia   . Hypertension   . Asthma   . Polycystic ovarian syndrome   . Thyroid disease   . Heart murmur   . Obesity   . Fatigue   . Contact lens/glasses fitting   . Hx-TIA (transient ischemic attack) 1992    Past Surgical History  Procedure Date  . Knee cartilage surgery 2005  . Abdominal hysterectomy 2009    Partial  . Tonsillectomy 2007  . Femur fracture surgery 2008    Left    Family History  Problem Relation Age of Onset  . Cancer Maternal Aunt     Breast Cancer    Social History History  Substance Use Topics  . Smoking status: Never Smoker   . Smokeless tobacco: Not on file  . Alcohol Use: 0.5 - 1.0 oz/week    1-2 drink(s) per week     occasionally    Allergies  Allergen Reactions  . Aspirin     REACTION: Anaphlactic reaction.   [name] Penicillins     REACTION: Rash    Current Outpatient Prescriptions  Medication Sig Dispense Refill  . amLODipine (NORVASC) 5 MG tablet Take 2.5 mg by mouth daily.       . Eszopiclone (ESZOPICLONE) 3 MG TABS Take 3 mg by mouth at bedtime. Take immediately before bedtime       . hydrochlorothiazide 25 MG tablet Take 25 mg by mouth daily.        [name] levothyroxine (SYNTHROID, LEVOTHROID) 125 MCG tablet Take 125 mcg by mouth daily.        [name] losartan (COZAAR) 50 MG tablet Take 50 mg by mouth daily.        [name] PARoxetine (PAXIL) 20 MG tablet Take 20 mg by mouth every morning.        . rosuvastatin (CRESTOR) 40 MG tablet Take 40 mg by mouth daily.          Review of Systems Review of Systems  Constitutional: Positive for malaise/fatigue.  HENT: Negative.   Eyes: Negative.   Respiratory: Positive for wheezing.   Cardiovascular: Negative.   Gastrointestinal: Negative.   Genitourinary: Negative.   Musculoskeletal: Negative.   Skin: Negative.   Neurological: Negative for focal weakness, seizures and loss of consciousness.  TIA 1992  Endo/Heme/Allergies: Negative.   Psychiatric/Behavioral: Negative.   All other systems reviewed and are negative.    Blood pressure 132/78, temperature 97.6 F (36.4 C), temperature source Temporal, resp. rate 16, height 5' 2.5" (1.588 m), weight 254 lb 12.8 oz (115.577 kg).  Physical Exam Physical Exam  Constitutional: She appears well-developed and well-nourished. No distress.  HENT:  Head: Normocephalic and atraumatic.  Mouth/Throat: No oropharyngeal exudate.  Eyes: Conjunctivae and EOM are normal. Pupils are equal, round, and reactive to light. Right eye exhibits no discharge. Left eye exhibits no discharge. No scleral icterus.  Neck: Normal range of motion. Neck supple. No tracheal deviation present. No thyromegaly present.  Cardiovascular: Normal rate and regular rhythm.   Respiratory: Effort normal and breath sounds normal. No stridor. No  respiratory distress. She has no wheezes.  GI: Soft. Bowel sounds are normal. She exhibits no distension and no mass. There is no tenderness. There is no rebound and no guarding.  Musculoskeletal: Normal range of motion. She exhibits no edema and no tenderness.  Lymphadenopathy:    She has no cervical adenopathy.  Neurological: She is alert.  Skin: Skin is warm and dry. No rash noted. She is not diaphoretic. No erythema. No pallor.  Psychiatric: She has a normal mood and affect. Her behavior is normal. Judgment and thought content normal.     Data Reviewed   Assessment    Morbid obesity with medical problems attributed to her obesity such as polycystic ovarian syndrome, hypertension, hyperlipidemia, borderline diabetes, asthma, hypothyroidism. I think that she would be a great candidate for any of the weight loss procedures offered. We had a long discussion regarding each procedure with its pros and cons and risks and benefits. She is interested in a sleeve gastrectomy. I think that she would be a great candidate for this. We will begin her weight loss surgery workup including her laboratory evaluation and we will get an upper GI. We will need to obtain her cardiology an echo results as well as her sleep study results. We will set her up with nutrition and cytology evaluations and when these are complete we will see her back to start the preop diet and schedule her for procedure. We discussed the risks of vitamin deficiencies, stricture, nausea, reflux, leaks, 2 little too much weight gain, the need for open procedure, and the need for conversion to Roux-en-Y gastric bypass. She expressed understanding and desires to proceed with the workup for sleeve gastrectomy.    Plan    We will set her up for nutrition and cytology evaluations  Laboratory studies  Obtain cardiology evaluation an echo results  Upper GI       Gaddiel Cullens DAVID 07/27/2011, 1:30 PM

## 2011-07-30 ENCOUNTER — Ambulatory Visit (HOSPITAL_COMMUNITY)
Admission: RE | Admit: 2011-07-30 | Discharge: 2011-07-30 | Disposition: A | Discharge status: Home / Self Care | Payer: Managed Care, Other (non HMO) | Source: Ambulatory Visit | Attending: General Surgery | Admitting: General Surgery

## 2011-08-01 ENCOUNTER — Ambulatory Visit (INDEPENDENT_AMBULATORY_CARE_PROVIDER_SITE_OTHER): Payer: Managed Care, Other (non HMO) | Admitting: General Surgery

## 2011-08-31 LAB — BASIC METABOLIC PANEL
BUN: 11
CO2: 24
Calcium: 8.9
Chloride: 102
Creatinine, Ser: 0.94
GFR calc Af Amer: >60
GFR calc non Af Amer: >60
Glucose, Bld: 104 — ABNORMAL HIGH
Potassium: 3.9
Sodium: 133 — ABNORMAL LOW

## 2011-08-31 LAB — HEMOGLOBIN AND HEMATOCRIT, BLOOD
HCT: 33.6 — ABNORMAL LOW
HCT: 33.8 — ABNORMAL LOW
Hemoglobin: 11.5 — ABNORMAL LOW
Hemoglobin: 11.5 — ABNORMAL LOW

## 2011-08-31 LAB — PREGNANCY, URINE: Preg Test, Ur: NEGATIVE

## 2011-09-18 ENCOUNTER — Telehealth: Payer: Self-pay | Admitting: Cardiology

## 2011-09-18 NOTE — Telephone Encounter (Signed)
Stress Faxed to [facility] @ [phone]   09/18/11/km

## 2011-09-20 ENCOUNTER — Other Ambulatory Visit: Payer: Self-pay | Admitting: Family Medicine

## 2011-09-20 DIAGNOSIS — Z1231 Encounter for screening mammogram for malignant neoplasm of breast: Secondary | ICD-10-CM

## 2011-10-12 ENCOUNTER — Ambulatory Visit: Payer: Managed Care, Other (non HMO)

## 2011-10-16 ENCOUNTER — Ambulatory Visit: Payer: Managed Care, Other (non HMO)

## 2011-10-18 ENCOUNTER — Other Ambulatory Visit (INDEPENDENT_AMBULATORY_CARE_PROVIDER_SITE_OTHER): Payer: Self-pay | Admitting: General Surgery

## 2011-10-29 ENCOUNTER — Ambulatory Visit: Payer: Managed Care, Other (non HMO) | Admitting: *Deleted

## 2011-11-02 ENCOUNTER — Encounter (HOSPITAL_COMMUNITY): Payer: Self-pay

## 2011-11-05 ENCOUNTER — Encounter: Payer: Self-pay | Admitting: *Deleted

## 2011-11-05 ENCOUNTER — Encounter: Payer: Managed Care, Other (non HMO) | Attending: General Surgery | Admitting: *Deleted

## 2011-11-05 ENCOUNTER — Telehealth: Payer: Self-pay | Admitting: Cardiology

## 2011-11-05 DIAGNOSIS — Z713 Dietary counseling and surveillance: Secondary | ICD-10-CM | POA: Insufficient documentation

## 2011-11-05 DIAGNOSIS — Z01818 Encounter for other preprocedural examination: Secondary | ICD-10-CM | POA: Insufficient documentation

## 2011-11-05 NOTE — Patient Instructions (Signed)
Follow:    Pre-Op Diet per MD 2 weeks prior to surgery  Phase 2- Liquids (clear/full) 2 weeks after surgery  Vitamin/Mineral/Calcium guidelines for purchasing bariatric supplements  Exercise guidelines pre and post-op per MD  Follow-up at [facility] in 2 weeks post-op for diet advancement. Contact Jayleigh Notarianni as needed with questions/concerns.  

## 2011-11-05 NOTE — Progress Notes (Signed)
  Bariatric Class:  Appt start time: 1730 end time:  1830.  Pre-Operative Nutrition Class  Patient was seen on 11/05/2011 for Pre-Operative Nutrition education [facility].   Surgery date: 11/12/11 Surgery type: LAGB  Weight today: 254.8 lbs  Weight change: 3.2 lb gain Total weight lost: N/A BMI: 46% Start wt @ [facility]: 251.6 lbs  Samples given per MNT protocol:  Bariatric Advantage Multivitamin  Lot # [id number]  Exp: 9/13   Bariatric Advantage Calcium Citrate  Lot # [id number]  Exp: 12/13   Celebrate Vitamins Multivitamin  Lot # [id number]  Exp: 6/14   Celebrate VitaminsCalcium Citrate  Lot # [id number]  Exp: 9/13   The following the learning objective met by the patient during this course:   Identifies Pre-Op Dietary Goals and will begin 2 weeks pre-operatively   Identifies appropriate sources of fluids and proteins   States protein recommendations and appropriate sources pre and post-operatively  Identifies Post-Operative Dietary Goals and will follow for 2 weeks post-operatively  Identifies appropriate multivitamin and calcium sources  Describes the need for physical activity post-operatively and will follow MD recommendations  States when to call healthcare provider regarding medication questions or post-operative complications  Handouts given during class include:  Pre-Op Bariatric Surgery Diet Handout  Protein Shake Handout  Post-Op Bariatric Surgery Nutrition Handout  BELT Program Information Flyer  Support Group Information Flyer  Follow-Up Plan: Patient will follow-up at [facility] 2 weeks post operatively for diet advancement per MD.

## 2011-11-05 NOTE — Telephone Encounter (Signed)
Echo,Stress faxed to [facility] @ [phone]  11/05/11/km

## 2011-11-06 ENCOUNTER — Encounter (HOSPITAL_COMMUNITY)
Admission: RE | Admit: 2011-11-06 | Discharge: 2011-11-06 | Disposition: A | Discharge status: Home / Self Care | Payer: Managed Care, Other (non HMO) | Source: Ambulatory Visit | Attending: General Surgery | Admitting: General Surgery

## 2011-11-06 ENCOUNTER — Encounter (HOSPITAL_COMMUNITY): Payer: Self-pay

## 2011-11-06 ENCOUNTER — Ambulatory Visit (HOSPITAL_COMMUNITY)
Admission: RE | Admit: 2011-11-06 | Discharge: 2011-11-06 | Disposition: A | Discharge status: Home / Self Care | Payer: Managed Care, Other (non HMO) | Source: Ambulatory Visit | Attending: General Surgery | Admitting: General Surgery

## 2011-11-06 DIAGNOSIS — Z01812 Encounter for preprocedural laboratory examination: Secondary | ICD-10-CM | POA: Insufficient documentation

## 2011-11-06 DIAGNOSIS — I771 Stricture of artery: Secondary | ICD-10-CM | POA: Insufficient documentation

## 2011-11-06 DIAGNOSIS — I10 Essential (primary) hypertension: Secondary | ICD-10-CM | POA: Insufficient documentation

## 2011-11-06 DIAGNOSIS — Z01818 Encounter for other preprocedural examination: Secondary | ICD-10-CM | POA: Insufficient documentation

## 2011-11-06 HISTORY — DX: Anemia, unspecified: D64.9

## 2011-11-06 HISTORY — DX: Hypothyroidism, unspecified: E03.9

## 2011-11-06 HISTORY — DX: Anxiety disorder, unspecified: F41.9

## 2011-11-06 LAB — DIFFERENTIAL
Basophils Absolute: 0 10*3/uL (ref 0.0–0.1)
Basophils Relative: 1 % (ref 0–1)
Eosinophils Absolute: 0.3 10*3/uL (ref 0.0–0.7)
Eosinophils Relative: 3 % (ref 0–5)
Lymphocytes Relative: 33 % (ref 12–46)
Lymphs Abs: 2.9 10*3/uL (ref 0.7–4.0)
Monocytes Absolute: 0.6 10*3/uL (ref 0.1–1.0)
Monocytes Relative: 7 % (ref 3–12)
Neutro Abs: 5 10*3/uL (ref 1.7–7.7)
Neutrophils Relative %: 57 % (ref 43–77)

## 2011-11-06 LAB — COMPREHENSIVE METABOLIC PANEL
ALT: 20 U/L (ref 0–35)
AST: 17 U/L (ref 0–37)
Albumin: 3.5 g/dL (ref 3.5–5.2)
Alkaline Phosphatase: 105 U/L (ref 39–117)
BUN: 21 mg/dL (ref 6–23)
CO2: 25 mEq/L (ref 19–32)
Calcium: 9.8 mg/dL (ref 8.4–10.5)
Chloride: 102 mEq/L (ref 96–112)
Creatinine, Ser: 0.72 mg/dL (ref 0.50–1.10)
GFR calc Af Amer: >90 mL/min (ref >90)
GFR calc non Af Amer: >90 mL/min (ref >90)
Glucose, Bld: 86 mg/dL (ref 70–99)
Potassium: 3.7 mEq/L (ref 3.5–5.1)
Sodium: 136 mEq/L (ref 135–145)
Total Bilirubin: 0.2 mg/dL — ABNORMAL LOW (ref 0.3–1.2)
Total Protein: 7.3 g/dL (ref 6.0–8.3)

## 2011-11-06 LAB — CBC
HCT: 43.9 % (ref 36.0–46.0)
Hemoglobin: 15.3 g/dL — ABNORMAL HIGH (ref 12.0–15.0)
MCH: 30.8 pg (ref 26.0–34.0)
MCHC: 34.9 g/dL (ref 30.0–36.0)
MCV: 88.3 fL (ref 78.0–100.0)
Platelets: 251 10*3/uL (ref 150–400)
RBC: 4.97 MIL/uL (ref 3.87–5.11)
RDW: 13.4 % (ref 11.5–15.5)
WBC: 8.8 10*3/uL (ref 4.0–10.5)

## 2011-11-06 LAB — SURGICAL PCR SCREEN
MRSA, PCR: NEGATIVE
Staphylococcus aureus: POSITIVE — AB

## 2011-11-06 NOTE — Pre-Procedure Instructions (Signed)
02/21/11 Stress test report on chart  02/20/11 Echo report on chart

## 2011-11-06 NOTE — Pre-Procedure Instructions (Signed)
02/21/11 ekg on chart along with stress test  02/20/11 echo on chart

## 2011-11-06 NOTE — Patient Instructions (Signed)
20 Alison [name]  11/06/2011   Your procedure is scheduled on: 11/12/2011 [id number] am   Report to [facility] at 0515  AM.  Call this number if you have problems the morning of surgery: [phone]   Remember:   Do not eat food:After Midnight.  May have clear liquids:until Midnight .  Clear liquids include soda, tea, black coffee, apple or grape juice, broth.  Take these medicines the morning of surgery with A SIP OF WATER:    Do not wear jewelry, make-up or nail polish.  Do not wear lotions, powders, or perfumes.    Do not shave 48 hours prior to surgery.  Do not bring valuables to the hospital.  Contacts, dentures or bridgework may not be worn into surgery.  Leave suitcase in the car. After surgery it may be brought to your room.  For patients admitted to the hospital, checkout time is 11:00 AM the day of discharge.     Special Instructions: CHG Shower Use Special Wash: 1/2 bottle night before surgery and 1/2 bottle morning of surgery. Shower with CHG chin to toes. Use regular soap on face and private parts.   Please read over the following fact sheets that you were given: MRSA Information, coughing and deep breathing exercises aand leg exercises.

## 2011-11-07 ENCOUNTER — Ambulatory Visit (INDEPENDENT_AMBULATORY_CARE_PROVIDER_SITE_OTHER): Payer: Managed Care, Other (non HMO) | Admitting: General Surgery

## 2011-11-07 NOTE — Progress Notes (Signed)
Patient ID: Alison [name], female   DOB: [birth year], 50 y.o.   MRN: [id number]  Chief Complaint  Patient presents with  . Bariatric Pre-op    Pre-op lap band12/3    HPI Alison [name] is a 50 y.o. female.  This patient is known to me for prior evaluation for weight loss surgery. She has completed her preoperative workup including an upper GI. She has a BMI of 47 with hypertension hyperlipidemia asthma hypothyroidism and polycystic ovarian disease. She was originally intravenously gastrectomy and has changed her mind and has now decided on a lap and procedure she is concerned about the irreversibility of the sleeve. She has lost 4 pounds on her preoperative diet, but otherwise no changes since our last visit. HPI  Past Medical History  Diagnosis Date  . Hyperlipidemia   . Hypertension   . Asthma   . Polycystic ovarian syndrome   . Thyroid disease   . Heart murmur   . Obesity   . Contact lens/glasses fitting   . Hx-TIA (transient ischemic attack) 1992  . Hypothyroidism   . Anemia     prior to hysterectomy   . Anxiety     Past Surgical History  Procedure Date  . Knee cartilage surgery 2005  . Abdominal hysterectomy 2009    Partial  . Tonsillectomy 2007  . Femur fracture surgery 2008    Left    Family History  Problem Relation Age of Onset  . Cancer Maternal Aunt     Breast Cancer  . Anesthesia problems Sister     Social History History  Substance Use Topics  . Smoking status: Never Smoker   . Smokeless tobacco: Never Used  . Alcohol Use: No     occasionally    Allergies  Allergen Reactions  . Aspirin Anaphylaxis    REACTION: Anaphlactic reaction.  [name] Penicillins Itching   All other review of systems negative or noncontributory except as stated in the HPI   Current Outpatient Prescriptions  Medication Sig Dispense Refill  . albuterol (PROVENTIL HFA;VENTOLIN HFA) 108 (90 BASE) MCG/ACT inhaler Inhale 2 puffs into the lungs every 6 (six) hours as needed. For asthma       . amLODipine (NORVASC) 5 MG tablet Take 5 mg by mouth every morning.       . hydrochlorothiazide 25 MG tablet Take 25 mg by mouth every other day.       . levothyroxine (SYNTHROID, LEVOTHROID) 125 MCG tablet Take 125 mcg by mouth every morning.       [name] losartan (COZAAR) 50 MG tablet Take 50 mg by mouth every morning.       [name] PARoxetine (PAXIL) 20 MG tablet Take 20 mg by mouth every morning.         Review of Systems Review of Systems  Blood pressure 132/86, pulse 72, temperature 98.3 F (36.8 C), temperature source Temporal, resp. rate 16, height 5' 2.5" (1.588 m), weight 256 lb 6.4 oz (116.302 kg).  Physical Exam Physical Exam  Vitals reviewed. Constitutional: She is oriented to person, place, and time. She appears well-developed and well-nourished. No distress.  HENT:  Head: Normocephalic and atraumatic.  Mouth/Throat: No oropharyngeal exudate.  Eyes: Conjunctivae are normal. Pupils are equal, round, and reactive to light. Right eye exhibits no discharge. Left eye exhibits no discharge. No scleral icterus.  Neck: Normal range of motion. Neck supple. No tracheal deviation present.  Cardiovascular: Normal rate, regular rhythm and normal heart sounds.   Pulmonary/Chest: Effort normal and breath  sounds normal. No stridor. No respiratory distress. She has no wheezes.  Abdominal: Soft. Bowel sounds are normal. She exhibits no distension and no mass. There is no tenderness. There is no rebound and no guarding.  Musculoskeletal: Normal range of motion. She exhibits no edema and no tenderness.  Neurological: She is alert and oriented to person, place, and time.  Skin: Skin is warm and dry. No rash noted. She is not diaphoretic. No erythema. No pallor.  Psychiatric: She has a normal mood and affect. Her behavior is normal. Judgment and thought content normal.    Data Reviewed   Assessment    Morbid obesity with a BMI of 47 and comorbidities of hypertension, hypothyroidism, asthma,  obesity, polycystic ovarian syndrome. I had a long discussion with her again with regarding the options for surgery including LAP-BAND, sleeve gastrectomy, or Roux-en-Y gastric bypass. She was originally treated in this gastrectomy and has now decided on the LAP-BAND. I think she would be a fine candidate for this procedure and she has completed all of her other workup. We discussed the risks of the procedure including infection, bleeding, pain, scarring, slippage, erosion, dysphasia, injury to spleen and poor problems and too little or  too much weight loss. She expressed understanding and desires to proceed with lap band. We also discussed the perioperative period and the long-term lifestyle changes that are required for good results and she seems very motivated and compliant and should do well with the procedure.    Plan    Plan for lap band on Monday       Lodema Pilot DAVID 11/07/2011, 5:02 PM

## 2011-11-12 ENCOUNTER — Encounter (HOSPITAL_COMMUNITY)
Admission: RE | Disposition: A | Discharge status: Home / Self Care | Payer: Self-pay | Source: Ambulatory Visit | Attending: General Surgery

## 2011-11-12 ENCOUNTER — Encounter (HOSPITAL_COMMUNITY): Payer: Self-pay | Admitting: Anesthesiology

## 2011-11-12 ENCOUNTER — Ambulatory Visit (HOSPITAL_COMMUNITY): Payer: Managed Care, Other (non HMO) | Admitting: Anesthesiology

## 2011-11-12 ENCOUNTER — Observation Stay (HOSPITAL_COMMUNITY)
Admission: RE | Admit: 2011-11-12 | Discharge: 2011-11-13 | Disposition: A | Discharge status: Home / Self Care | Payer: Managed Care, Other (non HMO) | Source: Ambulatory Visit | Attending: General Surgery | Admitting: General Surgery

## 2011-11-12 ENCOUNTER — Encounter (HOSPITAL_COMMUNITY): Payer: Self-pay | Admitting: *Deleted

## 2011-11-12 DIAGNOSIS — E669 Obesity, unspecified: Secondary | ICD-10-CM

## 2011-11-12 DIAGNOSIS — I10 Essential (primary) hypertension: Secondary | ICD-10-CM | POA: Insufficient documentation

## 2011-11-12 DIAGNOSIS — E785 Hyperlipidemia, unspecified: Secondary | ICD-10-CM | POA: Insufficient documentation

## 2011-11-12 DIAGNOSIS — E039 Hypothyroidism, unspecified: Secondary | ICD-10-CM | POA: Insufficient documentation

## 2011-11-12 DIAGNOSIS — Z6841 Body Mass Index (BMI) 40.0 and over, adult: Secondary | ICD-10-CM | POA: Insufficient documentation

## 2011-11-12 HISTORY — PX: LAPAROSCOPIC GASTRIC BANDING: SHX1100

## 2011-11-12 SURGERY — GASTRIC BANDING, LAPAROSCOPIC
Anesthesia: General | Site: Abdomen | Wound class: Clean

## 2011-11-12 MED ORDER — ONDANSETRON HCL 4 MG/2ML IJ SOLN
INTRAMUSCULAR | Status: DC | PRN
  Administered 2011-11-12: 4 mg via INTRAVENOUS

## 2011-11-12 MED ORDER — BUPIVACAINE HCL (PF) 0.25 % IJ SOLN
INTRAMUSCULAR | Status: DC | PRN
  Administered 2011-11-12 (×2): 22 mL

## 2011-11-12 MED ORDER — LACTATED RINGERS IV SOLN
INTRAVENOUS | Status: DC
  Administered 2011-11-12: 125 mL/h via INTRAVENOUS

## 2011-11-12 MED ORDER — ROCURONIUM BROMIDE 100 MG/10ML IV SOLN
INTRAVENOUS | Status: DC | PRN
  Administered 2011-11-12: 10 mg via INTRAVENOUS
  Administered 2011-11-12: 50 mg via INTRAVENOUS

## 2011-11-12 MED ORDER — MIDAZOLAM HCL 5 MG/5ML IJ SOLN
INTRAMUSCULAR | Status: DC | PRN
  Administered 2011-11-12: 2 mg via INTRAVENOUS

## 2011-11-12 MED ORDER — MORPHINE SULFATE 2 MG/ML IJ SOLN
2.0000 mg | INTRAMUSCULAR | Status: DC | PRN
  Administered 2011-11-12 – 2011-11-13 (×4): 2 mg via INTRAVENOUS
  Filled 2011-11-12 (×4): qty 1

## 2011-11-12 MED ORDER — LACTATED RINGERS IV SOLN
INTRAVENOUS | Status: DC | PRN
  Administered 2011-11-12 (×2): via INTRAVENOUS

## 2011-11-12 MED ORDER — LIDOCAINE-EPINEPHRINE 1 %-1:100000 IJ SOLN
INTRAMUSCULAR | Status: AC
  Filled 2011-11-12: qty 1

## 2011-11-12 MED ORDER — FENTANYL CITRATE 0.05 MG/ML IJ SOLN
INTRAMUSCULAR | Status: DC | PRN
  Administered 2011-11-12: 100 ug via INTRAVENOUS
  Administered 2011-11-12 (×3): 50 ug via INTRAVENOUS

## 2011-11-12 MED ORDER — LIDOCAINE-EPINEPHRINE 1 %-1:100000 IJ SOLN
INTRAMUSCULAR | Status: DC | PRN
  Administered 2011-11-12: 22 mL

## 2011-11-12 MED ORDER — LIDOCAINE HCL (CARDIAC) 20 MG/ML IV SOLN
INTRAVENOUS | Status: DC | PRN
  Administered 2011-11-12: 100 mg via INTRAVENOUS

## 2011-11-12 MED ORDER — NEOSTIGMINE METHYLSULFATE 1 MG/ML IJ SOLN
INTRAMUSCULAR | Status: DC | PRN
  Administered 2011-11-12: 5 mg via INTRAVENOUS

## 2011-11-12 MED ORDER — HYDROCHLOROTHIAZIDE 25 MG PO TABS
25.0000 mg | ORAL_TABLET | ORAL | Status: DC
  Filled 2011-11-12: qty 1

## 2011-11-12 MED ORDER — ENOXAPARIN SODIUM 40 MG/0.4ML ~~LOC~~ SOLN
40.0000 mg | Freq: Once | SUBCUTANEOUS | Status: AC
  Administered 2011-11-12: 40 mg via SUBCUTANEOUS

## 2011-11-12 MED ORDER — OXYCODONE-ACETAMINOPHEN 5-325 MG/5ML PO SOLN
5.0000 mL | ORAL | Status: DC | PRN
  Administered 2011-11-12: 5 mL via ORAL
  Filled 2011-11-12: qty 5

## 2011-11-12 MED ORDER — ACETAMINOPHEN 10 MG/ML IV SOLN
INTRAVENOUS | Status: DC | PRN
  Administered 2011-11-12: 1000 mg via INTRAVENOUS

## 2011-11-12 MED ORDER — LOSARTAN POTASSIUM 50 MG PO TABS
50.0000 mg | ORAL_TABLET | ORAL | Status: DC
  Administered 2011-11-13: 50 mg via ORAL
  Filled 2011-11-12 (×3): qty 1

## 2011-11-12 MED ORDER — LEVOTHYROXINE SODIUM 125 MCG PO TABS
125.0000 ug | ORAL_TABLET | ORAL | Status: DC
  Administered 2011-11-13: 125 ug via ORAL
  Filled 2011-11-12 (×3): qty 1

## 2011-11-12 MED ORDER — PROMETHAZINE HCL 25 MG/ML IJ SOLN: 6.2500 mg | INTRAMUSCULAR | Status: DC | PRN

## 2011-11-12 MED ORDER — SODIUM CHLORIDE 0.9 % IJ SOLN
INTRAMUSCULAR | Status: DC | PRN
  Administered 2011-11-12: 10 mL

## 2011-11-12 MED ORDER — ACETAMINOPHEN 10 MG/ML IV SOLN
INTRAVENOUS | Status: AC
  Filled 2011-11-12: qty 100

## 2011-11-12 MED ORDER — FENTANYL CITRATE 0.05 MG/ML IJ SOLN
INTRAMUSCULAR | Status: AC
  Filled 2011-11-12: qty 2

## 2011-11-12 MED ORDER — PAROXETINE HCL 20 MG PO TABS
20.0000 mg | ORAL_TABLET | ORAL | Status: DC
  Administered 2011-11-13: 20 mg via ORAL
  Filled 2011-11-12 (×3): qty 1

## 2011-11-12 MED ORDER — CLINDAMYCIN PHOSPHATE 900 MG/50ML IV SOLN
900.0000 mg | Freq: Once | INTRAVENOUS | Status: AC
  Administered 2011-11-12: 900 mg via INTRAVENOUS
  Filled 2011-11-12 (×2): qty 50

## 2011-11-12 MED ORDER — KCL IN DEXTROSE-NACL 20-5-0.45 MEQ/L-%-% IV SOLN
INTRAVENOUS | Status: DC
  Administered 2011-11-12 – 2011-11-13 (×3): via INTRAVENOUS
  Filled 2011-11-12 (×6): qty 1000

## 2011-11-12 MED ORDER — PROPOFOL 10 MG/ML IV EMUL
INTRAVENOUS | Status: DC | PRN
  Administered 2011-11-12: 200 mg via INTRAVENOUS

## 2011-11-12 MED ORDER — ENOXAPARIN SODIUM 40 MG/0.4ML ~~LOC~~ SOLN
40.0000 mg | Freq: Two times a day (BID) | SUBCUTANEOUS | Status: DC
  Administered 2011-11-13: 40 mg via SUBCUTANEOUS
  Filled 2011-11-12 (×4): qty 0.4

## 2011-11-12 MED ORDER — AMLODIPINE BESYLATE 5 MG PO TABS
5.0000 mg | ORAL_TABLET | Freq: Every day | ORAL | Status: DC
  Filled 2011-11-12 (×2): qty 1

## 2011-11-12 MED ORDER — FENTANYL CITRATE 0.05 MG/ML IJ SOLN
25.0000 ug | INTRAMUSCULAR | Status: DC | PRN
  Administered 2011-11-12: 25 ug via INTRAVENOUS

## 2011-11-12 MED ORDER — GLYCOPYRROLATE 0.2 MG/ML IJ SOLN
INTRAMUSCULAR | Status: DC | PRN
  Administered 2011-11-12: .8 mg via INTRAVENOUS

## 2011-11-12 MED ORDER — STERILE WATER FOR IRRIGATION IR SOLN
Status: DC | PRN
  Administered 2011-11-12: 1000 mL

## 2011-11-12 MED ORDER — BUPIVACAINE HCL (PF) 0.25 % IJ SOLN
INTRAMUSCULAR | Status: AC
  Filled 2011-11-12: qty 30

## 2011-11-12 MED ORDER — ACETAMINOPHEN 160 MG/5ML PO SOLN: 650.0000 mg | ORAL | Status: DC | PRN

## 2011-11-12 MED ORDER — ALBUTEROL SULFATE HFA 108 (90 BASE) MCG/ACT IN AERS
2.0000 | INHALATION_SPRAY | Freq: Four times a day (QID) | RESPIRATORY_TRACT | Status: DC | PRN
  Filled 2011-11-12: qty 6.7

## 2011-11-12 MED ORDER — DEXAMETHASONE SODIUM PHOSPHATE 4 MG/ML IJ SOLN
INTRAMUSCULAR | Status: DC | PRN
  Administered 2011-11-12: 10 mg via INTRAVENOUS

## 2011-11-12 MED ORDER — LACTATED RINGERS IV SOLN: INTRAVENOUS | Status: DC

## 2011-11-12 MED ORDER — ONDANSETRON HCL 4 MG/2ML IJ SOLN
4.0000 mg | INTRAMUSCULAR | Status: DC | PRN
  Administered 2011-11-12: 4 mg via INTRAVENOUS
  Filled 2011-11-12: qty 2

## 2011-11-12 SURGICAL SUPPLY — 49 items
BAND LAP 10.0 W/TUBES (Band) ×2 IMPLANT
BENZOIN TINCTURE PRP APPL 2/3 (GAUZE/BANDAGES/DRESSINGS) IMPLANT
BLADE HEX COATED 2.75 (ELECTRODE) ×2 IMPLANT
BLADE SURG 15 STRL LF DISP TIS (BLADE) ×1 IMPLANT
BLADE SURG 15 STRL SS (BLADE) ×1
CABLE HIGH FREQUENCY MONO STRZ (ELECTRODE) ×2 IMPLANT
CANISTER SUCTION 2500CC (MISCELLANEOUS) ×2 IMPLANT
CHLORAPREP W/TINT 26ML (MISCELLANEOUS) ×4 IMPLANT
CLOTH BEACON ORANGE TIMEOUT ST (SAFETY) ×2 IMPLANT
COVER SURGICAL LIGHT HANDLE (MISCELLANEOUS) ×2 IMPLANT
DECANTER SPIKE VIAL GLASS SM (MISCELLANEOUS) ×4 IMPLANT
DERMABOND ADVANCED (GAUZE/BANDAGES/DRESSINGS)
DERMABOND ADVANCED .7 DNX12 (GAUZE/BANDAGES/DRESSINGS) IMPLANT
DEVICE SUT TI-KNOT TK 5X26 (MISCELLANEOUS) ×2 IMPLANT
DEVICE SUTURE ENDOST 10MM (ENDOMECHANICALS) ×2 IMPLANT
DRAPE CAMERA CLOSED 9X96 (DRAPES) ×2 IMPLANT
ELECT REM PT RETURN 9FT ADLT (ELECTROSURGICAL) ×2
ELECTRODE REM PT RTRN 9FT ADLT (ELECTROSURGICAL) ×1 IMPLANT
ENDOSTITCH 0 SINGLE 48 (SUTURE) IMPLANT
GLOVE BIOGEL PI IND STRL 7.0 (GLOVE) ×1 IMPLANT
GLOVE BIOGEL PI INDICATOR 7.0 (GLOVE) ×1
GLOVE SURG SS PI 7.5 STRL IVOR (GLOVE) ×4 IMPLANT
GOWN STRL NON-REIN LRG LVL3 (GOWN DISPOSABLE) ×4 IMPLANT
GOWN STRL REIN XL XLG (GOWN DISPOSABLE) ×4 IMPLANT
HOVERMATT SINGLE USE (MISCELLANEOUS) ×2 IMPLANT
KIT BASIN OR (CUSTOM PROCEDURE TRAY) ×2 IMPLANT
NEEDLE HYPO 22GX1.5 SAFETY (NEEDLE) ×2 IMPLANT
NEEDLE SPNL 22GX3.5 QUINCKE BK (NEEDLE) ×2 IMPLANT
NEEDLE SPNL 22GX7 QUINCKE BK (NEEDLE) ×2 IMPLANT
NS IRRIG 1000ML POUR BTL (IV SOLUTION) ×2 IMPLANT
PACK UNIVERSAL I (CUSTOM PROCEDURE TRAY) ×2 IMPLANT
PENCIL BUTTON HOLSTER BLD 10FT (ELECTRODE) ×2 IMPLANT
SET IRRIG TUBING LAPAROSCOPIC (IRRIGATION / IRRIGATOR) IMPLANT
SOLUTION ANTI FOG 6CC (MISCELLANEOUS) ×2 IMPLANT
SPONGE GAUZE 4X4 12PLY (GAUZE/BANDAGES/DRESSINGS) ×2 IMPLANT
SPONGE LAP 18X18 X RAY DECT (DISPOSABLE) ×2 IMPLANT
STAPLER VISISTAT 35W (STAPLE) ×2 IMPLANT
STRIP CLOSURE SKIN 1/2X4 (GAUZE/BANDAGES/DRESSINGS) IMPLANT
SUT MNCRL AB 4-0 PS2 18 (SUTURE) ×4 IMPLANT
SUT PROLENE 2 0 CT2 30 (SUTURE) ×2 IMPLANT
SUT SILK 0 (SUTURE)
SUT SILK 0 30XBRD TIE 6 (SUTURE) IMPLANT
SYR 20CC LL (SYRINGE) ×2 IMPLANT
TOWEL OR 17X26 10 PK STRL BLUE (TOWEL DISPOSABLE) ×6 IMPLANT
TROCAR BLADELESS 15MM (ENDOMECHANICALS) ×2 IMPLANT
TROCAR BLADELESS OPT 5 150 (ENDOMECHANICALS) ×2 IMPLANT
TROCAR XCEL NON-BLD 5MMX100MML (ENDOMECHANICALS) ×6 IMPLANT
TUBE CALIBRATION LAPBAND (TUBING) ×2 IMPLANT
TUBING INSUFFLATION 10FT LAP (TUBING) ×2 IMPLANT

## 2011-11-12 NOTE — Transfer of Care (Signed)
Immediate Anesthesia Transfer of Care Note  Patient: Alison [name]  Procedure(s) Performed:  LAPAROSCOPIC GASTRIC BANDING - nathanson liver retractor/ endostitch available 2-0 surgidac refills/ layton needle drivers Haematologist to proctor  Patient Location: PACU  Anesthesia Type: General  Level of Consciousness: awake and alert   Airway & Oxygen Therapy: Patient Spontanous Breathing and Patient connected to face mask oxygen  Post-op Assessment: Report given to PACU RN and Post -op Vital signs reviewed and stable  Post vital signs: Reviewed and stable  Complications: No apparent anesthesia complications

## 2011-11-12 NOTE — Brief Op Note (Signed)
11/12/2011  9:21 AM  PATIENT:  Alison [name]  50 y.o. female  PRE-OPERATIVE DIAGNOSIS:  morbid obesity   POST-OPERATIVE DIAGNOSIS:  morbid obesity   PROCEDURE:  Procedure(s): LAPAROSCOPIC GASTRIC BANDING  SURGEON:  Surgeon(s): Rulon Abide, DO Valarie Merino, MD  PHYSICIAN ASSISTANT:   ASSISTANTS: martin   ANESTHESIA:   general  EBL:  Total I/O In: 1000 [I.V.:1000] Out: -   BLOOD ADMINISTERED:none  DRAINS: none   LOCAL MEDICATIONS USED:  MARCAINE 22CC and LIDOCAINE 22CC  SPECIMEN:  No Specimen  DISPOSITION OF SPECIMEN:  N/A  COUNTS:  YES  TOURNIQUET:  * No tourniquets in log *  DICTATION: .Other Dictation: Dictation Number [id number]  PLAN OF CARE: Admit for overnight observation  PATIENT DISPOSITION:  PACU - hemodynamically stable.   Delay start of Pharmacological VTE agent (>24hrs) due to surgical blood loss or risk of bleeding:  {YES/NO/NOT APPLICABLE:20182

## 2011-11-12 NOTE — Anesthesia Postprocedure Evaluation (Signed)
Anesthesia Post Note  Patient: Alison [name]  Procedure(s) Performed:  LAPAROSCOPIC GASTRIC BANDING - nathanson liver retractor/ endostitch available 2-0 surgidac refills/ layton needle drivers Haematologist to proctor  Anesthesia type: General  Patient location: PACU  Post pain: Pain level controlled  Post assessment: Post-op Vital signs reviewed  Last Vitals:  Filed Vitals:   11/12/11 1000  BP: 132/70  Pulse: 71  Temp:   Resp: 18    Post vital signs: Reviewed  Level of consciousness: sedated  Complications: No apparent anesthesia complications

## 2011-11-12 NOTE — Op Note (Signed)
NAMELEYDI, WINSTEAD NO.:  [id number]  MEDICAL RECORD NO.:  [id number]  LOCATION:  1528                         FACILITY:  [facility]  PHYSICIAN:  Lodema Pilot, MD       DATE OF BIRTH:  04/22/1961  DATE OF PROCEDURE:  11/12/2011 DATE OF DISCHARGE:                              OPERATIVE REPORT   PROCEDURE:  Laparoscopic adjustable gastric band placement.  PREOPERATIVE DIAGNOSIS:  Morbid obesity.  POSTOPERATIVE DIAGNOSIS:  Morbid obesity.  SURGEON:  Lodema Pilot, MD  ASSISTANT:  Dr. Daphine Deutscher.  ANESTHESIA:  General endotracheal anesthesia with 45 cc of 1% lidocaine with epinephrine and 0.25% Marcaine plain.  FLUIDS:  1750 cc of crystalloid.  ESTIMATED BLOOD LOSS:  Minimal.  DRAINS:  None.  SPECIMENS:  None.  COMPLICATIONS:  None apparent.  FINDINGS:  Normal-appearing anatomy.  No evidence of hiatal hernia. Placement of AP standard Lap-Band. Dr. Mariel Craft [facility] proctor) was present in the room for the procedure.  INDICATION FOR PROCEDURE:  Ms. Pigford is a 50 year old female with a BMI of 47, hypertension, hyperlipidemia, hypothyroidism; who has failed medical weight loss attempts.  OPERATIVE DETAILS:  Ms. Lowdermilk was seen and evaluated in the preoperative area and risks and benefits of the procedure were again discussed in lay terms.  Informed consent was obtained.  Prophylactic Lovenox was given and she was taken to the operating room, placed on table in supine position.  General endotracheal tube anesthesia was obtained and prophylactic antibiotics were given.  Her abdomen was prepped and draped in the standard surgical fashion.  A 5-mm Optiview trocar was used to access the abdomen through the left upper quadrant.  Pneumoperitoneum was obtained and laparoscope was introduced and there was no evidence of bowel injury upon entry.  Then, a 5-mm left rectus port was placed in and a 15-mm right rectus port was placed and a 5-mm lateral trocar  was placed; all under direct visualization.  Nathanson liver retractor was placed through the epigastrium through a separate stab incision to retract the left lobe of the liver which appeared to be fairly small and nonfatty.  There were no obvious intraabdominal abnormalities.  She did have some lower abdominal adhesions from her prior lower abdominal surgery, but these were not affecting our procedure and adhesiolysis was not performed.  The stomach was decompressed with the sizing tube and 10 cc of air were placed into the sizing tube and the tube was retracted with good resistance at the GE junction without any evidence of hiatal hernia.  Then, the peritoneum was taken down at the angle of His using a small amount of Bovie electrocautery and blunt dissection, just medial to the phrenic vein and then the pars flaccida was opened near the crossing fat on the right crus, I made my retrogastric tunnel and passed an instrument bluntly through the retrogastric tunnel and exited through the opening near the angle of His, went without difficulty and without resistance.  Then, AP standard band was placed through the 15 port and pulled through the retrogastric tunnel and it was seated in place and the tubing was locked in place around the upper portion of stomach within  a very small pouch.  Then, the 3 fundoplication sutures were placed high on the stomach, placing them in gastrogastric fashion and a Patterson Anti-Slip stitch was placed near the lesser curve of the stomach.  All these sutures were placed with 2-0 Surgidac and the stitch in the abdomen was noted to be hemostatic without any evidence of bowel injury.  Then, tubing was pulled up through the upper abdomen in the left rectus port and opened and enlarged it by a 15 port incision to accommodate placement of the subcutaneous reservoir.  The subcutaneous fat was divided and the fascia was identified and 2-0 Surgidac suture was placed  using Endostitch through the fascia and the tubing was tunneled from the left rectus port to the right rectus port site superficial to the fascia and the tubing was connected to the port. Sutures were placed and the port was seated down on the fascia, covering the fascia defect and the sutures were secured with the port lying flat and the tubing tunneling towards the left side of the patient.  Then, camera was reintroduced into the abdomen and abdomen was noted to be hemostatic, and the liver retractor was removed.  The tubing was pulled back into the abdomen with the tubing joint in the abdomen. The right- sided trocar was removed under direct visualization and all sponge, needle, and the final port were removed.  The wounds were injected with a total of 45 cc of 1% lidocaine with epinephrine and 0.25% Marcaine plain, and the skin edges were approximated with 4-0 Monocryl subcuticular suture.  The skin was washed and dried, and Dermabond was applied.  All sponge, needle, instrument counts were correct at the end of the case.  The patient tolerated the procedure well without apparent complication.          ______________________________ Lodema Pilot, MD     BL/MEDQ  D:  11/12/2011  T:  11/12/2011  Job:  [id number]

## 2011-11-12 NOTE — H&P (Signed)
  Date of Initial H&P: 11/09/11  History reviewed, patient examined, no change in status, stable for surgery. Pt seen in the preop area and risks of lap band again discussed and she desires to proceed with planned procedure.  We discussed the risks of infection, bleeding, pain, scarring, slippage, too little or too much weight loss, injury to spleen, need for open, dysphagia, vitamin deficiency, erosion, and need for future surgery again discussed.  Lodema Pilot DAVID 11/12/2011 7:13 AM

## 2011-11-12 NOTE — Anesthesia Preprocedure Evaluation (Addendum)
Anesthesia Evaluation    Airway Mallampati: III TM Distance: >3 FB Neck ROM: Full    Dental No notable dental hx. (+) Teeth Intact   Pulmonary asthma (Rx prn) ,  clear to auscultation  Pulmonary exam normal       Cardiovascular Exercise Tolerance: Poor hypertension, Pt. on medications + Valvular Problems/Murmurs Regular Normal    Neuro/Psych TIANegative Psych ROS   GI/Hepatic negative GI ROS, Neg liver ROS, hiatal hernia,   Endo/Other  Hypothyroidism Morbid obesity  Renal/GU   Genitourinary negative   Musculoskeletal   Abdominal   Peds negative pediatric ROS (+)  Hematology negative hematology ROS (+)   Anesthesia Other Findings   Reproductive/Obstetrics                          Anesthesia Physical Anesthesia Plan  ASA: II  Anesthesia Plan: General   Post-op Pain Management:    Induction: Intravenous  Airway Management Planned: Oral ETT  Additional Equipment:   Intra-op Plan:   Post-operative Plan: Extubation in OR  Informed Consent: I have reviewed the patients History and Physical, chart, labs and discussed the procedure including the risks, benefits and alternatives for the proposed anesthesia with the patient or authorized representative who has indicated his/her understanding and acceptance.   Dental advisory given  Plan Discussed with: CRNA  Anesthesia Plan Comments:         Anesthesia Quick Evaluation

## 2011-11-13 ENCOUNTER — Ambulatory Visit (HOSPITAL_COMMUNITY): Payer: Managed Care, Other (non HMO)

## 2011-11-13 LAB — CBC
HCT: 41.8 % (ref 36.0–46.0)
Hemoglobin: 14.1 g/dL (ref 12.0–15.0)
MCH: 29.9 pg (ref 26.0–34.0)
MCHC: 33.7 g/dL (ref 30.0–36.0)
MCV: 88.7 fL (ref 78.0–100.0)
Platelets: 247 10*3/uL (ref 150–400)
RBC: 4.71 MIL/uL (ref 3.87–5.11)
RDW: 13.8 % (ref 11.5–15.5)
WBC: 12.6 10*3/uL — ABNORMAL HIGH (ref 4.0–10.5)

## 2011-11-13 LAB — DIFFERENTIAL
Basophils Absolute: 0 10*3/uL (ref 0.0–0.1)
Basophils Relative: 0 % (ref 0–1)
Eosinophils Absolute: 0 10*3/uL (ref 0.0–0.7)
Eosinophils Relative: 0 % (ref 0–5)
Lymphocytes Relative: 14 % (ref 12–46)
Lymphs Abs: 1.7 10*3/uL (ref 0.7–4.0)
Monocytes Absolute: 0.8 10*3/uL (ref 0.1–1.0)
Monocytes Relative: 7 % (ref 3–12)
Neutro Abs: 10 10*3/uL — ABNORMAL HIGH (ref 1.7–7.7)
Neutrophils Relative %: 80 % — ABNORMAL HIGH (ref 43–77)

## 2011-11-13 LAB — COMPREHENSIVE METABOLIC PANEL
ALT: 41 U/L — ABNORMAL HIGH (ref 0–35)
AST: 29 U/L (ref 0–37)
Albumin: 3.4 g/dL — ABNORMAL LOW (ref 3.5–5.2)
Alkaline Phosphatase: 86 U/L (ref 39–117)
BUN: 5 mg/dL — ABNORMAL LOW (ref 6–23)
CO2: 24 mEq/L (ref 19–32)
Calcium: 9.4 mg/dL (ref 8.4–10.5)
Chloride: 104 mEq/L (ref 96–112)
Creatinine, Ser: 0.6 mg/dL (ref 0.50–1.10)
GFR calc Af Amer: >90 mL/min (ref >90)
GFR calc non Af Amer: >90 mL/min (ref >90)
Glucose, Bld: 150 mg/dL — ABNORMAL HIGH (ref 70–99)
Potassium: 4 mEq/L (ref 3.5–5.1)
Sodium: 138 mEq/L (ref 135–145)
Total Bilirubin: 0.3 mg/dL (ref 0.3–1.2)
Total Protein: 6.9 g/dL (ref 6.0–8.3)

## 2011-11-13 MED ORDER — OXYCODONE-ACETAMINOPHEN 5-325 MG/5ML PO SOLN: 5.0000 mL | ORAL | Status: AC | PRN

## 2011-11-13 MED ORDER — IOHEXOL 300 MG/ML  SOLN
50.0000 mL | Freq: Once | INTRAMUSCULAR | Status: AC | PRN
  Administered 2011-11-13: 50 mL via ORAL

## 2011-11-13 NOTE — Progress Notes (Signed)
1 Day Post-Op  Subjective: Doing great. Denies pain.  Objective: Vital signs in last 24 hours: Temp:  [97.6 F (36.4 C)-98.7 F (37.1 C)] 97.6 F (36.4 C) (12/04 1002) Pulse Rate:  [59-81] 59  (12/04 1002) Resp:  [18-20] 18  (12/04 1002) BP: (121-163)/(78-97) 136/82 mmHg (12/04 1002) SpO2:  [92 %-97 %] 96 % (12/04 1002) Last BM Date: 11/11/11  Intake/Output from previous day: 12/03 0701 - 12/04 0700 In: 2654.7 [P.O.:240; I.V.:2414.7] Out: 4700 [Urine:4700] Intake/Output this shift: Total I/O In: 60 [P.O.:60] Out: -   General appearance: alert, cooperative and no distress GI: soft, minimal incisional tenderness, wounds okay  Lab Results:   Basename 11/13/11 0442  WBC 12.6*  HGB 14.1  HCT 41.8  PLT 247   BMET  Basename 11/13/11 0442  NA 138  K 4.0  CL 104  CO2 24  GLUCOSE 150*  BUN 5*  CREATININE 0.60  CALCIUM 9.4   PT/INR No results found for this basename: LABPROT:2,INR:2 in the last 72 hours ABG No results found for this basename: PHART:2,PCO2:2,PO2:2,HCO3:2 in the last 72 hours  Studies/Results: Dg Ugi W/water Sol Cm  11/13/2011  *RADIOLOGY REPORT*  Clinical Data:Postop lap band.  WATER SOLUBLE UPPER GI SERIES  Technique:  Single-column upper GI series was performed using water soluble contrast.  Fluoroscopy Time: 1.2 minutes  Contrast: 20 ml water-soluble contrast.  Comparison: Upper GI 07/30/2011  Findings: Initial KUB excludes the lap band.  The access hub is in the central abdomen.  Initial fluoroscopic image demonstrates the lap band in appropriate orientation at 2 o'clock to 8 o'clock. Oral contrast passed the lap band easily without obstruction or leak.  The esophagus emptied near completely.  IMPRESSION:  1.  Lap band appears in appropriate orientation. 2.  No evidence of obstruction or leak.  Findings conveyed to Dr. Biagio Quint on 11/13/2011  Original Report Authenticated By: Genevive Bi, M.D.    Anti-infectives: Anti-infectives     Start      Dose/Rate Route Frequency Ordered Stop   11/12/11 0600   clindamycin (CLEOCIN) IVPB 900 mg        900 mg 100 mL/hr over 30 Minutes Intravenous  Once 11/12/11 0548 11/12/11 0737          Assessment/Plan: s/p Procedure(s): LAPAROSCOPIC GASTRIC BANDING Discharge, plan for discharge today on postband diet.  LOS: 1 day    Lodema Pilot DAVID 11/13/2011

## 2011-11-13 NOTE — Progress Notes (Signed)
Pt d/c to the home. Pt and pt's family member (sister) both verbalize understanding of all d/c instructions and stated neither had any question concerning any of the d/c instructions. Pt taken to car via wheelchair.  Darene Lamer Brynna Dobos,RN

## 2011-11-13 NOTE — Progress Notes (Signed)
Pt verbalizes understanding of use of Incentive Spirometer. Pt states she "knows how to use it" and states she has no questions regarding its correct use. Marcelino Duster, RN

## 2011-11-13 NOTE — Plan of Care (Signed)
Problem: Discharge Progression Outcomes Goal: KUB acceptable Outcome: Not Applicable Date Met:  11/13/11 Dr Biagio Quint did not order KUB but UGI instead

## 2011-11-13 NOTE — Progress Notes (Signed)
Pt alert and oriented; sitting in chair at bedside; VSS; denies any nausea or vomiting; tolerating sips of water well; pain relieved by prn pain meds; voiding without difficulty; denies passing gas at this time; ambulating without difficulty; pt already has follow up appts with [facility] and [facility]; awaiting UGI; desires discharge today; discharge instructions reviewed and pt verbalizes understanding. ADJUSTABLE GASTRIC BAND DISCHARGE INSTRUCTIONS  Drs. Fredrik Rigger, [place], Wilson, and [place] Call if you have any problems.   Call [phone] and ask for the surgeon on call.    If you need immediate assistance come to the ER at [facility]. Tell the ER personnel that you are a new post-op gastric banding patient. Signs and symptoms to report:   Severe vomiting or nausea. If you cannot tolerate clear liquids for longer than 1 day, you need to call your surgeon.    Abdominal pain which does not get better after taking your pain medication   Fever greater than 101 F degree   Difficulty breathing   Chest pain    Redness, swelling, drainage, or foul odor at incision sites    If your incisions open or pull apart   Swelling or pain in calf (lower leg)   Diarrhea, frequent watery, uncontrolled bowel movements.   Constipation, (no bowel movements for 3 days) if this occurs, Take Milk of Magnesia, 2 tablespoons by mouth, 3 times a day for 2 days if needed.  Call your doctor if constipation continues. Stop taking Milk of Magnesia once you have had a bowel movement. You may also use Miralax according to the label instructions.   Anything you consider "abnormal for you".   Normal side effects after Surgery:   Unable to sleep at night or concentrate   Irritability   Being tearful (crying) or depressed   These are common complaints, possibly related to your anesthesia, stress of surgery and change in lifestyle, that usually go away a few weeks after surgery.  If these feelings continue, call your medical  doctor.  Wound Care You may have surgical glue, steri-strips, or staples over your incisions after surgery.  Surgical glue:  Looks like a clear film over your incisions and will wear off gradually. Steri-strips: Strips of tape over your incisions. You may notice a yellowish color on the skin underneath the steri-strips. This is a substance used to make the steri-strips stick better. Do not pull the steri-strips off - let them fall off. Staples: Cherlynn Polo may be removed before you leave the hospital. If you go home with staples, call [facility] [phone]) for an appointment with your surgeon's nurse to have staples removed in 7 - 10 days. Showering: You may shower two days after your surgery unless otherwise instructed by your surgeon. Wash gently around wounds with warm soapy water, rinse well, and gently pat dry.  If you have a drain, you may need someone to hold this while you shower. Avoid tub baths until staples are removed and incisions are healed.    Medications   Medications should be liquid or crushed if larger than the size of a dime.  Extended release pills should not be crushed.   Depending on the size and number of medications you take, you may need to stagger/change the time you take your medications so that you do not over-fill your pouch.    Make sure you follow-up with your primary care physician to make medication adjustments needed during rapid weight loss and life-style adjustment.   If you are  diabetic, follow up with the doctor that prescribes your diabetes medication(s) within one week after surgery and check your blood sugar regularly.   Do not drive while taking narcotics!   Do not take acetaminophen (Tylenol) and Roxicet or Lortab Elixir at the same time since these pain medications contain acetaminophen.  Diet at home: (First 2 Weeks) You will see the nutritionist two weeks after your surgery. She will advance your diet if you are tolerating liquids  well. Once at home, if you have severe vomiting or nausea and cannot tolerate clear liquids lasting longer than 1 day, call your surgeon.  For Same Day Surgery Discharge Patients: The day of surgery drink water only: 2 ounces every 4 hours. If you are tolerating water, begin drinking your high protein shake the next morning. For Overnight Stay Patients: Begin high protein shake 2 ounces every 3 hours, 5 - 6 times per day.  Gradually increase the amount you drink as tolerated.  You may find it easier to slowly sip shakes throughout the day.  It is important to get your proteins in first.   Protein Shake   Drink at least 2 ounces of shake 5-6 times per day   Each serving of protein shakes should have a minimum of 15 grams of protein and no more than 5 grams of carbohydrate    Increase the amount of protein shake you drink as tolerated   Protein powder may be added to fluids such as non-fat milk or Lactaid milk (limit to 20 grams added protein powder per serving   The initial goal is to drink at least 8 ounces of protein shake/drink per day (or as directed by the nutritionist). Some examples of protein shakes are [employer], [employer], EAS Edge HP, and Unjury. Hydration   Gradually increase the amount of water and other liquids as tolerated (See Acceptable Fluids)   Gradually increase the amount of protein shake as tolerated     Sip fluids slowly and throughout the day   May use Sugar substitutes, use sparingly (limit to 6 - 8 packets per day). Your fluid goal is 64 ounces of fluid daily. It may take a few weeks to build up to this.         32 oz (or more) should be clear liquids and 32 oz (or more) should be full liquids.         Liquids should not contain sugar, caffeine, or carbonation! Acceptable Fluids Clear Liquids:   Water or Sugar-free flavored water, Fruit H2O   Decaffeinated coffee or tea (sugar-free)   Crystal Lite, Wyler's Lite, Minute Maid Lite   Sugar-free Jell-O    Bouillon or broth   Sugar-free Popsicle:   *Less than 20 calories each; Limit 1 per day   Full Liquids:              Protein Shakes/Drinks + 2 choices per day of other full liquids shown below.    Other full liquids must be: No more than 12 grams of Carbs per serving,  No more than 3 grams of Fat per serving   Strained low-fat cream soup   Non-Fat milk   Fat-free Lactaid Milk   Sugar-free yogurt (Dannon Lite & Fit) Vitamins and Minerals (Start 1 day after surgery unless otherwise directed)   1 Chewable Multivitamin / Multimineral Supplement (i.e. Centrum for Adults)   Chewable Calcium Citrate with Vitamin D-3. Take 1500 mg each day.           (  Example: 3 Chewable Calcium Plus 600 with Vitamin D-3 can be found at [facility])           Do not mix multivitamins containing iron with calcium supplements; take 2 hours   apart   Do not substitute Tums (calcium carbonate) for your calcium   Menstruating women and those at risk for anemia may need extra iron. Talk with your doctor to see if you need additional iron.    If you need extra iron:  Total daily Iron recommendations (including Vitamins) = 50 - 100 mg Iron/day Do not stop taking or change any vitamins or minerals until you talk to your nutritionist or surgeon. Your nutritionist and / or physician must approve all vitamin and mineral supplements. Exercise For maximum success, begin exercising as soon as your doctor recommends. Make sure your physician approves any physical activity.   Depending on fitness level, begin with a simple walking program   Walk 5-15 minutes each day, 7 days per week.    Slowly increase until you are walking 30-45 minutes per day   Consider joining our BELT program. [phone] or email [email or web address] [email or web address] Things to remember:    You may have sexual relations when you feel comfortable. It is VERY important for female patients to use a reliable birth control method. Fertility often increases after surgery. Do not get  pregnant for at least 18 months.   It is very important to keep all follow up appointments with your surgeon, nutritionist, primary care physician, and behavioral health practitioner. After the first year, please follow up with your bariatric surgeon at least once a year in order to maintain best weight loss results.  [facility]: [phone] [facility]: [phone]   Free counseling is available for you and your family through collaboration between [facility] and [place]. Please call [phone] and leave a message.    Consider purchasing a medical alert bracelet that says you had lap-band surgery.    [facility] has a free Bariatric Surgery Support Group that meets monthly, the 3rd Thursday, 6 pm, Classroom #1, [place]. You may register online at [email or web address], but registration is not necessary. Select Classes and Support Groups, Bariatric Surgery, or Call [phone]   Do not return to work or drive until cleared by your surgeon   Use your CPAP when sleeping if applicable   Do not lift anything greater than ten pounds for at least two weeks.   You will probably have your first fill (fluid added to your band) 6 weeks after surgery  Talmadge Chad, RN Bariatric Nurse Coordinator

## 2011-11-14 ENCOUNTER — Encounter (HOSPITAL_COMMUNITY): Payer: Self-pay | Admitting: General Surgery

## 2011-11-14 NOTE — Discharge Summary (Signed)
Physician Discharge Summary  Patient ID: Alison [name] MRN: [id number] DOB/AGE: [birth year] 49 y.o.  Admit date: 11/12/2011 Discharge date: 11/14/2011  Admission Diagnoses: obesity  Discharge Diagnoses: obesity Active Problems:  * No active hospital problems. *    Discharged Condition: good  Hospital Course: to OR 11/12/11 for lap band placement.  No complications.  Admitted for overnight observation.  UGI on POD 1 with patent band.  Doing well and discharged home POD 1.  Consults: none  Significant Diagnostic Studies: UGI  Treatments: surgery: lap band   Disposition: Home or Self Care  Discharge Orders    Future Appointments: Provider: Department: Dept Phone: Center:   11/29/2011 8:45 AM Rulon Abide, DO [facility] [phone] None     Future Orders Please Complete By Expires   Increase activity slowly      Discharge instructions      Comments:   Post lap band diet. Follow up with Biagio Quint in 4 weeks, [phone] May shower tomorrow. Take only liquid meds or chewable or crushed meds.   Call MD for:  temperature >100.4      Call MD for:  persistant nausea and vomiting      Call MD for:  severe uncontrolled pain      Call MD for:  redness, tenderness, or signs of infection (pain, swelling, redness, odor or green/yellow discharge around incision site)      Call MD for:  difficulty breathing, headache or visual disturbances        Discharge Medication List as of 11/13/2011  1:15 PM    START taking these medications   Details  oxyCODONE-acetaminophen (ROXICET) 5-325 MG/5ML solution Take 5-10 mLs by mouth every 4 (four) hours as needed., Starting 11/13/2011, Until Fri 11/23/11, Print      CONTINUE these medications which have NOT CHANGED   Details  amLODipine (NORVASC) 5 MG tablet Take 5 mg by mouth every morning. , Until Discontinued, Historical Med    hydrochlorothiazide 25 MG tablet Take 25 mg by mouth every other day. , Until Discontinued, Historical Med      levothyroxine (SYNTHROID, LEVOTHROID) 125 MCG tablet Take 125 mcg by mouth every morning. , Until Discontinued, Historical Med    losartan (COZAAR) 50 MG tablet Take 50 mg by mouth every morning. , Until Discontinued, Historical Med    PARoxetine (PAXIL) 20 MG tablet Take 20 mg by mouth every morning. , Until Discontinued, Historical Med    albuterol (PROVENTIL HFA;VENTOLIN HFA) 108 (90 BASE) MCG/ACT inhaler Inhale 2 puffs into the lungs every 6 (six) hours as needed. For asthma, Until Discontinued, Historical Med         SignedLodema Pilot DAVID 11/14/2011, 10:24 AM

## 2011-11-19 ENCOUNTER — Telehealth: Payer: Self-pay | Admitting: *Deleted

## 2011-11-19 NOTE — Telephone Encounter (Signed)
Alison [name] called today to report that her LAGB surgery went well on 12/3. She reported no problems with chewing, swallowing, pain, or regurgitation. She stated that she had been doing the Full Liquid Diet and can drink a whole protein shake almost right after surgery. She reports that last night she had about 2 cups of mashed potatoes.  Recommended pt remain on Full Liquid Diet until 2 weeks post-op and avoid high-carbohydrate foods. Recommended she call and ask her surgeon if she is able to have a soft, moist protein-rich food such as cheese or egg.  Pt is to follow up at [facility] for 2 week post op appointment on 11/29/11.

## 2011-11-29 ENCOUNTER — Encounter: Payer: Managed Care, Other (non HMO) | Attending: General Surgery | Admitting: *Deleted

## 2011-11-29 ENCOUNTER — Ambulatory Visit (INDEPENDENT_AMBULATORY_CARE_PROVIDER_SITE_OTHER): Payer: Managed Care, Other (non HMO) | Admitting: General Surgery

## 2011-11-29 ENCOUNTER — Encounter: Payer: Self-pay | Admitting: *Deleted

## 2011-11-29 ENCOUNTER — Encounter (INDEPENDENT_AMBULATORY_CARE_PROVIDER_SITE_OTHER): Payer: Self-pay | Admitting: General Surgery

## 2011-11-29 VITALS — BP 118/76 | HR 68 | Temp 97.2°F | Resp 16 | Ht 62.5 in | Wt 243.2 lb

## 2011-11-29 DIAGNOSIS — Z4889 Encounter for other specified surgical aftercare: Secondary | ICD-10-CM

## 2011-11-29 DIAGNOSIS — Z01818 Encounter for other preprocedural examination: Secondary | ICD-10-CM | POA: Insufficient documentation

## 2011-11-29 DIAGNOSIS — Z5189 Encounter for other specified aftercare: Secondary | ICD-10-CM

## 2011-11-29 DIAGNOSIS — Z713 Dietary counseling and surveillance: Secondary | ICD-10-CM | POA: Insufficient documentation

## 2011-11-29 NOTE — Progress Notes (Signed)
Subjective:     Patient ID: Alison [name], female   DOB: 05-05-1961, 50 y.o.   MRN: [id number]  HPI This patient follows up 3 weeks status post LAP-BAND placement. She's doing very well and denies any abdominal pain or other GI complaints. She has returned to work and actually tried some salmon and some more solid foods recently. She said the last bite of salmon felt as though it kind of stuck but she did not throw up. Otherwise she has been taking down liquids to this point. She's been doing her walking and physical exercise as well.She is taking her protein supplements and multivitamins.  Review of Systems     Objective:   Physical Exam She is in no acute distress and nontoxic-appearing  Her abdomen is soft and nontender exam incisions are well-healed without signs of infection report is palpable and no evidence of port site problems.    Assessment:     Status post lap band placement-doing well    Plan:     I did not adjust her bed all today since she has lost 13 pounds and more in her scale. She has not tried any regular foods at this point and I'm really not sure how tight she is said she did say that she did have some salmon getting stuck. I recommended that she come back in 2 weeks for repeat evaluation and over this time. That she increase her diet to regular diet and see how she does.

## 2011-11-29 NOTE — Patient Instructions (Signed)
Patient to follow Phase 3A-Soft, High Protein Diet and follow-up at [facility] in 6 weeks for 2 months post-op nutrition visit for diet advancement. 

## 2011-11-29 NOTE — Progress Notes (Signed)
  Bariatric Class:  Appt start time: 1030 end time:  1100.  2 Week Post-Operative Nutrition Class  Patient was seen on 11/29/2011 for Post-Operative Nutrition education at the [facility].   Surgery date: 11/12/11  Surgery type: LAGB   Weight today: 242.1 lbs Weight change: 8.2 lbs lost Total weight lost: 8.2 lbs BMI: 43.7% Start wt @ [facility]: 251.6 lbs  The following the learning objective met the patient during this course:   Identifies Phase 3A (Soft, High Proteins) Dietary Goals and will begin from 2 weeks post-operatively to 2 months post-operatively   Identifies appropriate sources of fluids and proteins   States protein recommendations and appropriate sources post-operatively  Identifies the need for appropriate texture modifications, mastication, and bite sizes when consuming solids  Identifies appropriate multivitamin and calcium sources post-operatively  Describes the need for physical activity post-operatively and will follow MD recommendations  States when to call healthcare provider regarding medication questions or post-operative complications  Handouts given during class include:  Phase 3A: Soft, High Protein Diet Handout  Band Fill Guidelines Handout  Follow-Up Plan: Patient will follow-up at [facility] in 6 weeks for 2 months post-op nutrition visit for diet advancement per MD.

## 2011-12-21 ENCOUNTER — Ambulatory Visit (INDEPENDENT_AMBULATORY_CARE_PROVIDER_SITE_OTHER): Payer: Managed Care, Other (non HMO) | Admitting: General Surgery

## 2011-12-21 ENCOUNTER — Encounter (INDEPENDENT_AMBULATORY_CARE_PROVIDER_SITE_OTHER): Payer: Self-pay | Admitting: General Surgery

## 2011-12-21 VITALS — BP 132/88 | HR 75 | Temp 97.3°F | Ht 62.0 in | Wt 241.4 lb

## 2011-12-21 DIAGNOSIS — Z5189 Encounter for other specified aftercare: Secondary | ICD-10-CM

## 2011-12-21 DIAGNOSIS — Z4889 Encounter for other specified surgical aftercare: Secondary | ICD-10-CM

## 2011-12-21 NOTE — Progress Notes (Signed)
Subjective:     Patient ID: Alison [name], female   DOB: January 28, 1961, 51 y.o.   MRN: [id number]  HPI She follows up today one month status post lap band placement. She reports doing very well and has lost 15 pounds on our scale but she states that this is more like 20 pounds on her scale. She is very pleased and denies any pain. She is tolerating regular diet and has not had any food sticking or reflux, cough, abdominal pain, regurgitation. She is not really snacking but also does not complain of any fluid restriction. She says that she does get hungry towards the evening but she just has been controlling herself.  Review of Systems     Objective:   Physical Exam No distress and nontoxic-appearing  Her abdomen is soft and nontender exam incisions are well healed without any signs of port problems. I accessed her band on the first attempt and she had 3 cc in her band and I added an additional 0.6 cc for a total of 3.6 cc in her band    Assessment:     Morbid obesity    Plan:     She seems to be doing very well to this point. She has had good weight loss and we did add a little to her band for a total of 3.6 cc of fluid in her band now. I recommended that she stay on liquids for 2 days and soft foods for 2 days and then increase diet as tolerated and I'll see her back in 4 weeks for repeat evaluation.

## 2012-01-09 ENCOUNTER — Encounter: Payer: Self-pay | Admitting: *Deleted

## 2012-01-09 ENCOUNTER — Encounter: Payer: Managed Care, Other (non HMO) | Attending: General Surgery | Admitting: *Deleted

## 2012-01-09 DIAGNOSIS — E669 Obesity, unspecified: Secondary | ICD-10-CM

## 2012-01-09 DIAGNOSIS — Z713 Dietary counseling and surveillance: Secondary | ICD-10-CM | POA: Insufficient documentation

## 2012-01-09 DIAGNOSIS — Z01818 Encounter for other preprocedural examination: Secondary | ICD-10-CM | POA: Insufficient documentation

## 2012-01-09 NOTE — Progress Notes (Signed)
  Follow-up visit: 9 Weeks Post-Operative LAGB Surgery  Medical Nutrition Therapy:  Appt start time: 1730 end time:  1800.  Assessment:  Primary concerns today: post-operative bariatric surgery nutrition management.  Surgery date: 11/12/11  Surgery type: LAGB   Weight today: 237.1 lbs Weight change: 5 lbs lost Total weight lost: 13.2 lbs BMI: 43.5% Start wt @ [facility]: 251.6 lbs  24-hr recall: No food recall given at time of visit* Pt reports still being able to eat/drink anything she chooses  Fluid intake: 64 oz+ Estimated total protein intake: 40-50g  Medications: No changes Supplementation: Taking regularly  Using straws: No Drinking while eating: No Hair loss: No Carbonated beverages: No N/V/D/C: No Last Lap-Band fill: One fill at this point per MD  Recent physical activity:  Limited at this time; No structured exercise reported.  Progress Towards Goal(s):  In progress.  Handouts given during visit include:  Phase 3B - High Protein + Non-starchy vegetables   Nutritional Diagnosis:  NI-1.5 Excessive energy intake As related to large portions after LAGB surgery.  As evidenced by pt consuming >1500 kcals/day.    Intervention:  Nutrition education/reinfocement.  Monitoring/Evaluation:  Dietary intake, exercise, lap band fills, and body weight. Follow up in 1-2 months for 3-4 month post-op visit.

## 2012-01-09 NOTE — Patient Instructions (Signed)
Goals:  Follow Phase 3B: High Protein + Non-Starchy Vegetables  Practice appropriate eating behavior changes  Chewing  Small bites  Moist, tender food  Increase lean protein foods to meet 60-75g goal  Increase fluid intake to 64oz +  Avoid drinking 15 minutes before, during and 30 minutes after eating  Aim for >30 min of physical activity daily

## 2012-01-18 ENCOUNTER — Ambulatory Visit (INDEPENDENT_AMBULATORY_CARE_PROVIDER_SITE_OTHER): Payer: Managed Care, Other (non HMO) | Admitting: General Surgery

## 2012-01-18 ENCOUNTER — Encounter (INDEPENDENT_AMBULATORY_CARE_PROVIDER_SITE_OTHER): Payer: Self-pay | Admitting: General Surgery

## 2012-01-18 VITALS — BP 126/88 | HR 68 | Temp 97.9°F | Resp 16 | Ht 62.25 in | Wt 238.0 lb

## 2012-01-18 DIAGNOSIS — Z5189 Encounter for other specified aftercare: Secondary | ICD-10-CM

## 2012-01-18 DIAGNOSIS — Z4889 Encounter for other specified surgical aftercare: Secondary | ICD-10-CM

## 2012-01-18 NOTE — Progress Notes (Signed)
Subjective:     Patient ID: Alison [name], female   DOB: September 06, 1961, 51 y.o.   MRN: [id number]  HPI This patient is 2 months status post laparoscopic adjustable gastric band placement with an AP standard band. She has lost a few pounds but she states that she has no restriction and this was all basically been dieting and exercise. She says that she feels no restriction and has no nausea or vomiting denies any coughing or abdominal pain.  Review of Systems     Objective:   Physical Exam No distress and nontoxic-appearing  No evidence of any port site problems. Her incisions are well-healed without sign of infection. Her port was accessed and she was given 1.5 cc of fluid. We aspirated 3.2 cc from her port. She should have a total of 4.7 cc in there at this point. There was no bounce back in the plunger    Assessment:     Morbid obesity status post lap band placement She did be doing well from her surgery and has no evidence of any complications. However, she has no restriction and so we did give her an adjustment today and added 1 1/2 cc of fluid in her band. She was able to tolerate liquids after this.    Plan:     She will resume her post-fill diet for 4 days and then increase her diet to regular diet. She will follow up in 4 weeks for repeat evaluation.

## 2012-02-15 ENCOUNTER — Ambulatory Visit (INDEPENDENT_AMBULATORY_CARE_PROVIDER_SITE_OTHER): Payer: Managed Care, Other (non HMO) | Admitting: General Surgery

## 2012-02-15 ENCOUNTER — Encounter (INDEPENDENT_AMBULATORY_CARE_PROVIDER_SITE_OTHER): Payer: Self-pay | Admitting: General Surgery

## 2012-02-15 VITALS — BP 130/86 | HR 68 | Temp 97.8°F | Resp 16 | Ht 62.25 in | Wt 238.2 lb

## 2012-02-15 DIAGNOSIS — E669 Obesity, unspecified: Secondary | ICD-10-CM

## 2012-02-15 NOTE — Progress Notes (Signed)
Subjective:     Patient ID: Alison [name], female   DOB: 1961/03/28, 51 y.o.   MRN: [id number]  HPI [name] follows up 3 months status post lap band placement. She states that she has not lost any any weight this month maybe do to poor food choices. She has been hungry and has somewhat restriction since her last adjustment of but mainly with really dry foods or if she eats too fast or does not to her food well. She has vomited twice after eating too fast and overdoing it. She has also slacked off on her exercise  Review of Systems     Objective:   Physical Exam No evidence of port site problems.  Port acessed and 0.20ml added for a total of 5.47ml    Assessment:     Obesity She continues to do well but has made some poor food choices.  We added a small amount today as I think that she is getting close to where she should be.      Plan:     F/u in 4 weeks and post fill diet for 4 days.  Resume exercise routine

## 2012-03-21 ENCOUNTER — Encounter (INDEPENDENT_AMBULATORY_CARE_PROVIDER_SITE_OTHER): Payer: Managed Care, Other (non HMO) | Admitting: General Surgery

## 2012-04-04 ENCOUNTER — Ambulatory Visit (INDEPENDENT_AMBULATORY_CARE_PROVIDER_SITE_OTHER): Payer: Managed Care, Other (non HMO) | Admitting: General Surgery

## 2012-04-04 ENCOUNTER — Encounter (INDEPENDENT_AMBULATORY_CARE_PROVIDER_SITE_OTHER): Payer: Self-pay | Admitting: General Surgery

## 2012-04-04 VITALS — BP 124/85 | HR 67 | Ht 62.25 in | Wt 234.2 lb

## 2012-04-04 DIAGNOSIS — K644 Residual hemorrhoidal skin tags: Secondary | ICD-10-CM

## 2012-04-04 DIAGNOSIS — Z4651 Encounter for fitting and adjustment of gastric lap band: Secondary | ICD-10-CM

## 2012-04-04 NOTE — Progress Notes (Addendum)
This encounter was created in error - please disregard.

## 2012-04-04 NOTE — Progress Notes (Signed)
Subjective:     Patient ID: Alison [name], female   DOB: 1961/11/01, 51 y.o.   MRN: [id number]  HPI She follows up today for possible lap band adjustment. She is 4 months out from her surgery and has lost 4 pounds since her last visit a month ago. She says that she is exercising but her main complaint is that she is "starving". She is hungry between meals and is asking for an adjustment. She says that she is using a saucer to eat but is still hungry after her meals. She does have some trouble with some dry meats with food getting stuck and she says that she vomits about once a week but she chooses to eating too much at one sitting. She is still taking her protein and her vitamins.  Review of Systems     Objective:   Physical Exam No acute distress and nontoxic-appearing Her abdomen is soft and nontender on exam her incisions are well-healed without signs of infection. I had trouble accessing her port and Dr. Johna Sheriff was able to access this on the first attempt and we added 0.5 cc to her band for an estimated total of 5.7 cc    Assessment:     Morbid obesity Given her hunger and snacking we have added a half cc of fluid to her band. I recommend that she continue to exercise and be cautious of the foods that she eats and it was see her back in another 4 weeks for repeat evaluation.    Plan:     followup in 4 weeks

## 2012-04-04 NOTE — Progress Notes (Signed)
Addended by: Lodema Pilot D on: 04/04/2012 01:37 PM   Modules accepted: Level of Service, SmartSet

## 2012-04-04 NOTE — Progress Notes (Deleted)
Patient ID: Barron Alvine, female   DOB: July 10, 1961, 51 y.o.   MRN: [id number]  Chief Complaint  Patient presents with  . Lap Band Fill    HPI IVY PURYEAR is a 51 y.o. female.  This patient was referred by Dr. Jarold Motto for evaluation of a rectal skin tag which has been present for several years. She says it has not increased in size but it does often bother her with bleeding and she feels like it increases the discomfort associated with her anal fissure. She says that she has a history of anal fissure which has caused discomfort and bleeding but she does not take any stool softeners or other treatments for this. She says that she moves her bowels about once a day and of normal consistency. She denies any problems with hygiene. HPI  Past Medical History  Diagnosis Date  . Hyperlipidemia   . Hypertension   . Asthma   . Polycystic ovarian syndrome   . Thyroid disease   . Heart murmur   . Obesity   . Contact lens/glasses fitting   . Hx-TIA (transient ischemic attack) 1992  . Hypothyroidism   . Anemia     prior to hysterectomy   . Anxiety     Past Surgical History  Procedure Date  . Knee cartilage surgery 2005  . Abdominal hysterectomy 2009    Partial  . Tonsillectomy 2007  . Femur fracture surgery 2008    Left  . Laparoscopic gastric banding 11/12/2011    Procedure: LAPAROSCOPIC GASTRIC BANDING;  Surgeon: Rulon Abide, DO;  Location: [facility];  Service: General;  Laterality: N/A;  nathanson liver retractor/ endostitch available 2-0 surgidac refills/ Jaiya [place] needle drivers available/ [employer] Doctor to proctor    Family History  Problem Relation Age of Onset  . Cancer Maternal Aunt     Breast Cancer  . Anesthesia problems Sister     Social History History  Substance Use Topics  . Smoking status: Never Smoker   . Smokeless tobacco: Never Used  . Alcohol Use: No     occasionally    Allergies  Allergen Reactions  . Aspirin Anaphylaxis    REACTION: Anaphlactic  reaction.  [name] Penicillins Itching    Current Outpatient Prescriptions  Medication Sig Dispense Refill  . albuterol (PROVENTIL HFA;VENTOLIN HFA) 108 (90 BASE) MCG/ACT inhaler Inhale 2 puffs into the lungs every 6 (six) hours as needed. For asthma      . amLODipine (NORVASC) 5 MG tablet Take 5 mg by mouth every morning.       . hydrochlorothiazide 25 MG tablet Take 25 mg by mouth every other day.       . levothyroxine (SYNTHROID, LEVOTHROID) 125 MCG tablet Take 125 mcg by mouth every morning.       [name] losartan (COZAAR) 50 MG tablet Take 50 mg by mouth every morning.       [name] PARoxetine (PAXIL) 20 MG tablet Take 20 mg by mouth every morning.         Review of Systems Review of Systems All other review of systems negative or noncontributory except as stated in the HPI  Blood pressure 124/85, pulse 67, height 5' 2.25" (1.581 m), weight 234 lb 3.2 oz (106.232 kg), SpO2 98.00%.  Physical Exam Physical Exam Physical Exam  Nursing note and vitals reviewed. Constitutional: She is oriented to person, place, and time. She appears well-developed and well-nourished. No distress.  HENT:  Head: Normocephalic and atraumatic.  Mouth/Throat: No oropharyngeal exudate.  Eyes: Conjunctivae and EOM are normal. Pupils are equal, round, and reactive to light. Right eye exhibits no discharge. Left eye exhibits no discharge. No scleral icterus.  Neck: Normal range of motion. Neck supple. No tracheal deviation present.  Cardiovascular: Normal rate, regular rhythm, normal heart sounds and intact distal pulses.   Pulmonary/Chest: Effort normal and breath sounds normal. No stridor. No respiratory distress. She has no wheezes.  Abdominal: Soft. Bowel sounds are normal. She exhibits no distension and no mass. There is no tenderness. There is no rebound and no guarding. Rectal with moderate sized anal skin tag anteriorly, small posterior hemorrhoid, no internal masses appreciated. Musculoskeletal: Normal range of  motion. She exhibits no edema and no tenderness.  Neurological: She is alert and oriented to person, place, and time.  Skin: Skin is warm and dry. No rash noted. She is not diaphoretic. No erythema. No pallor.  Psychiatric: She has a normal mood and affect. Her behavior is normal. Judgment and thought content normal.    Data Reviewed   Assessment    Perianal skin tag and hemorrhoids She does have a moderate size skin tag or redundant skin in the anterior portion of her anus. This shows some very mild hemorrhoids without evidence of any active problem. I think that this is low likelihood of malignancy or other serious problem. I did offer excision of this lesion and I would perform formal rectal examination incision is well to evaluate for other anal disease such as fissure or hemorrhoids. She is undecided whether she is interested in having this procedure at this time and she elected think about this and let me know what her decision and later. I recommended that she increase her fiber intake to 20-30 g of fiber per day and recommended some stool softeners and increase water intake as well. This would help with her anal fissure symptoms but not likely make any difference with his skin tag. Again, I did offer surgical excision but I doubt that this skin tag is causing her much discomfort.    Plan    If she would like to have this removed, we will plan for rectal exam and anesthesia with excision of this perianal skin tag       Shira Bobst DAVID 04/04/2012, 11:39 AM

## 2012-05-02 ENCOUNTER — Encounter (INDEPENDENT_AMBULATORY_CARE_PROVIDER_SITE_OTHER): Payer: Managed Care, Other (non HMO) | Admitting: General Surgery

## 2012-05-07 ENCOUNTER — Encounter (INDEPENDENT_AMBULATORY_CARE_PROVIDER_SITE_OTHER): Payer: Self-pay | Admitting: General Surgery

## 2012-05-09 ENCOUNTER — Ambulatory Visit
Admission: RE | Admit: 2012-05-09 | Discharge: 2012-05-09 | Disposition: A | Discharge status: Home / Self Care | Payer: Managed Care, Other (non HMO) | Source: Ambulatory Visit | Attending: Family Medicine | Admitting: Family Medicine

## 2012-05-09 DIAGNOSIS — Z1231 Encounter for screening mammogram for malignant neoplasm of breast: Secondary | ICD-10-CM

## 2012-05-12 ENCOUNTER — Other Ambulatory Visit: Payer: Self-pay | Admitting: Family Medicine

## 2012-05-12 DIAGNOSIS — N63 Unspecified lump in unspecified breast: Secondary | ICD-10-CM

## 2012-05-26 ENCOUNTER — Ambulatory Visit
Admission: RE | Admit: 2012-05-26 | Discharge: 2012-05-26 | Disposition: A | Discharge status: Home / Self Care | Payer: Managed Care, Other (non HMO) | Source: Ambulatory Visit | Attending: Family Medicine | Admitting: Family Medicine

## 2012-05-26 DIAGNOSIS — N63 Unspecified lump in unspecified breast: Secondary | ICD-10-CM

## 2013-12-25 LAB — HM PAP SMEAR

## 2013-12-29 DIAGNOSIS — E038 Other specified hypothyroidism: Secondary | ICD-10-CM | POA: Insufficient documentation

## 2013-12-29 DIAGNOSIS — G4700 Insomnia, unspecified: Secondary | ICD-10-CM | POA: Insufficient documentation

## 2013-12-29 DIAGNOSIS — Z8249 Family history of ischemic heart disease and other diseases of the circulatory system: Secondary | ICD-10-CM | POA: Insufficient documentation

## 2013-12-29 DIAGNOSIS — E890 Postprocedural hypothyroidism: Secondary | ICD-10-CM | POA: Insufficient documentation

## 2014-04-07 ENCOUNTER — Telehealth (HOSPITAL_COMMUNITY): Payer: Self-pay

## 2014-04-07 NOTE — Telephone Encounter (Signed)
This patient is overdue for recommended follow-up with a bariatric surgeon at [facility]. Call attempted today to reestablish post-op care with [facility], but unable to reach patient by phone.  A letter will be mailed to the patient today to the address on file from [facility] advising the patient on the benefits of follow-up care and directing them to call [facility] at [phone] to schedule an appointment at their earliest convenience.  ° °Amanda T. Fleming °Bariatric Office Coordinator °[phone] ° °

## 2015-02-10 ENCOUNTER — Other Ambulatory Visit: Payer: Self-pay

## 2015-02-10 DIAGNOSIS — Z1231 Encounter for screening mammogram for malignant neoplasm of breast: Secondary | ICD-10-CM

## 2015-02-14 ENCOUNTER — Ambulatory Visit
Admission: RE | Admit: 2015-02-14 | Discharge: 2015-02-14 | Disposition: A | Discharge status: Home / Self Care | Payer: Managed Care, Other (non HMO) | Source: Ambulatory Visit

## 2015-02-14 DIAGNOSIS — Z1231 Encounter for screening mammogram for malignant neoplasm of breast: Secondary | ICD-10-CM

## 2015-02-14 DIAGNOSIS — G2581 Restless legs syndrome: Secondary | ICD-10-CM | POA: Insufficient documentation

## 2015-05-16 ENCOUNTER — Emergency Department (HOSPITAL_COMMUNITY)
Admission: EM | Admit: 2015-05-16 | Discharge: 2015-05-16 | Disposition: A | Discharge status: Home / Self Care | Payer: Managed Care, Other (non HMO) | Attending: Emergency Medicine | Admitting: Emergency Medicine

## 2015-05-16 ENCOUNTER — Encounter (HOSPITAL_COMMUNITY): Payer: Self-pay

## 2015-05-16 DIAGNOSIS — J45909 Unspecified asthma, uncomplicated: Secondary | ICD-10-CM | POA: Diagnosis not present

## 2015-05-16 DIAGNOSIS — Z8673 Personal history of transient ischemic attack (TIA), and cerebral infarction without residual deficits: Secondary | ICD-10-CM | POA: Insufficient documentation

## 2015-05-16 DIAGNOSIS — I10 Essential (primary) hypertension: Secondary | ICD-10-CM | POA: Diagnosis not present

## 2015-05-16 DIAGNOSIS — E039 Hypothyroidism, unspecified: Secondary | ICD-10-CM | POA: Diagnosis not present

## 2015-05-16 DIAGNOSIS — Y9289 Other specified places as the place of occurrence of the external cause: Secondary | ICD-10-CM | POA: Insufficient documentation

## 2015-05-16 DIAGNOSIS — R011 Cardiac murmur, unspecified: Secondary | ICD-10-CM | POA: Insufficient documentation

## 2015-05-16 DIAGNOSIS — Z79899 Other long term (current) drug therapy: Secondary | ICD-10-CM | POA: Insufficient documentation

## 2015-05-16 DIAGNOSIS — Z862 Personal history of diseases of the blood and blood-forming organs and certain disorders involving the immune mechanism: Secondary | ICD-10-CM | POA: Insufficient documentation

## 2015-05-16 DIAGNOSIS — S81812A Laceration without foreign body, left lower leg, initial encounter: Secondary | ICD-10-CM | POA: Insufficient documentation

## 2015-05-16 DIAGNOSIS — Y9389 Activity, other specified: Secondary | ICD-10-CM | POA: Insufficient documentation

## 2015-05-16 DIAGNOSIS — Y288XXA Contact with other sharp object, undetermined intent, initial encounter: Secondary | ICD-10-CM | POA: Insufficient documentation

## 2015-05-16 DIAGNOSIS — Z880 Allergy status to penicillin: Secondary | ICD-10-CM | POA: Diagnosis not present

## 2015-05-16 DIAGNOSIS — E669 Obesity, unspecified: Secondary | ICD-10-CM | POA: Diagnosis not present

## 2015-05-16 DIAGNOSIS — Y998 Other external cause status: Secondary | ICD-10-CM | POA: Insufficient documentation

## 2015-05-16 DIAGNOSIS — IMO0002 Reserved for concepts with insufficient information to code with codable children: Secondary | ICD-10-CM

## 2015-05-16 MED ORDER — BACITRACIN 500 UNIT/GM EX OINT
1.0000 "application " | TOPICAL_OINTMENT | Freq: Two times a day (BID) | CUTANEOUS | Status: DC
  Administered 2015-05-16: 1 via TOPICAL
  Filled 2015-05-16 (×2): qty 0.9

## 2015-05-16 NOTE — ED Provider Notes (Signed)
CSN: [id number]     Arrival date & time 05/16/15  1154 History  This chart was scribed for non-physician practitioner, Montine Circle, PA-C working with Quintella Reichert, MD by Rayna Sexton, ED scribe. This patient was seen in room TR01C/TR01C and the patient's care was started at 1:38 PM.    Chief Complaint  Patient presents with  . Extremity Laceration    The history is provided by the patient. No language interpreter was used.    HPI Comments: Alison [name] is a 54 y.o. female who presents to the Emergency Department complaining of a laceration with controlled bleeding to the anterior of the lower left leg with onset yesterday evening. She additionally notes she was cutting boxes in her garage at the time of the incident and slipped and cut her leg. Pt confirms being UTD on her tetanus vaccination.    Past Medical History  Diagnosis Date  . Hyperlipidemia   . Hypertension   . Asthma   . Polycystic ovarian syndrome   . Thyroid disease   . Heart murmur   . Obesity   . Contact lens/glasses fitting   . Hx-TIA (transient ischemic attack) 1992  . Hypothyroidism   . Anemia     prior to hysterectomy   . Anxiety    Past Surgical History  Procedure Laterality Date  . Knee cartilage surgery  2005  . Abdominal hysterectomy  2009    Partial  . Tonsillectomy  2007  . Femur fracture surgery  2008    Left  . Laparoscopic gastric banding  11/12/2011    Procedure: LAPAROSCOPIC GASTRIC BANDING;  Surgeon: Judieth Keens, DO;  Location: [facility];  Service: General;  Laterality: N/A;  nathanson liver retractor/ endostitch available 2-0 surgidac refills/ [place] needle drivers available/ [employer] Doctor to proctor   Family History  Problem Relation Age of Onset  . Cancer Maternal Aunt     Breast Cancer  . Anesthesia problems Sister    History  Substance Use Topics  . Smoking status: Never Smoker   . Smokeless tobacco: Never Used  . Alcohol Use: No     Comment: occasionally   OB  History    No data available     Review of Systems  Constitutional: Negative for fever.  Skin: Positive for wound.      Allergies  Aspirin and Penicillins  Home Medications   Prior to Admission medications   Medication Sig Start Date End Date Taking? Authorizing Provider  albuterol (PROVENTIL HFA;VENTOLIN HFA) 108 (90 BASE) MCG/ACT inhaler Inhale 2 puffs into the lungs every 6 (six) hours as needed. For asthma    Historical Provider, MD  amLODipine (NORVASC) 5 MG tablet Take 5 mg by mouth every morning.     Historical Provider, MD  hydrochlorothiazide 25 MG tablet Take 25 mg by mouth every other day.     Historical Provider, MD  levothyroxine (SYNTHROID, LEVOTHROID) 125 MCG tablet Take 125 mcg by mouth every morning.     Historical Provider, MD  losartan (COZAAR) 50 MG tablet Take 50 mg by mouth every morning.     Historical Provider, MD  PARoxetine (PAXIL) 20 MG tablet Take 20 mg by mouth every morning.     Historical Provider, MD   BP 140/75 mmHg  Pulse 68  Temp(Src) 98.3 F (36.8 C) (Oral)  Resp 18  Ht 5' 2.5" (1.588 m)  Wt 207 lb (93.895 kg)  BMI 37.23 kg/m2  SpO2 98% Physical Exam  Constitutional: She is oriented  to person, place, and time. She appears well-developed and well-nourished. No distress.  HENT:  Head: Normocephalic and atraumatic.  Mouth/Throat: Oropharynx is clear and moist.  Eyes: Conjunctivae and EOM are normal. Pupils are equal, round, and reactive to light.  Neck: Normal range of motion. Neck supple. No tracheal deviation present.  Cardiovascular: Normal rate.   Pulmonary/Chest: Breath sounds normal. No respiratory distress.  Abdominal: Soft.  Musculoskeletal: Normal range of motion.  Neurological: She is alert and oriented to person, place, and time.  Skin: Skin is warm and dry.  1.5 cm laceration to left shin  Psychiatric: She has a normal mood and affect. Her behavior is normal.  Nursing note and vitals reviewed.   ED Course  Procedures   DIAGNOSTIC STUDIES: Oxygen Saturation is 98% on RA, normal by my interpretation.    COORDINATION OF CARE: 1:43 PM Discussed treatment plan with pt at bedside including cleaning and dressing the wound and pt agreed to plan.  Labs Review Labs Reviewed - No data to display  Imaging Review No results found.   EKG Interpretation None      MDM   Final diagnoses:  Laceration    1.5 cm laceration to left shin which per night. Will allow for closure by secondary intention. Tetanus shot is up-to-date. Risk of wound infection contraindicates primary closure.  I personally performed the services described in this documentation, which was scribed in my presence. The recorded information has been reviewed and is accurate.     Montine Circle, PA-C 05/16/15 1349  Quintella Reichert, MD 05/16/15 [phone]

## 2015-05-16 NOTE — ED Notes (Signed)
Pt reports lac occurred at 1900 Sunday night.

## 2015-05-16 NOTE — ED Notes (Signed)
Declined W/C at D/C and was escorted to lobby by RN. 

## 2015-05-16 NOTE — Discharge Instructions (Signed)

## 2015-05-16 NOTE — ED Notes (Signed)
Pt was cutting boxes yesterday and cut her left lower leg with the box cutter. Bleeding controlled.

## 2015-10-07 DIAGNOSIS — Z9884 Bariatric surgery status: Secondary | ICD-10-CM | POA: Insufficient documentation

## 2015-10-25 LAB — HM COLONOSCOPY

## 2016-09-11 DIAGNOSIS — M170 Bilateral primary osteoarthritis of knee: Secondary | ICD-10-CM | POA: Insufficient documentation

## 2016-10-25 ENCOUNTER — Encounter (HOSPITAL_COMMUNITY): Payer: Self-pay

## 2017-03-05 ENCOUNTER — Other Ambulatory Visit: Payer: Self-pay | Admitting: Family Medicine

## 2017-03-05 DIAGNOSIS — Z1231 Encounter for screening mammogram for malignant neoplasm of breast: Secondary | ICD-10-CM

## 2017-03-20 ENCOUNTER — Other Ambulatory Visit: Payer: Self-pay | Admitting: Family Medicine

## 2017-03-20 ENCOUNTER — Ambulatory Visit
Admission: RE | Admit: 2017-03-20 | Discharge: 2017-03-20 | Disposition: A | Discharge status: Home / Self Care | Payer: 59 | Source: Ambulatory Visit | Attending: Family Medicine | Admitting: Family Medicine

## 2017-03-20 DIAGNOSIS — N63 Unspecified lump in unspecified breast: Secondary | ICD-10-CM

## 2017-03-20 DIAGNOSIS — Z1231 Encounter for screening mammogram for malignant neoplasm of breast: Secondary | ICD-10-CM

## 2017-03-22 ENCOUNTER — Ambulatory Visit
Admission: RE | Admit: 2017-03-22 | Discharge: 2017-03-22 | Disposition: A | Discharge status: Home / Self Care | Payer: 59 | Source: Ambulatory Visit | Attending: Family Medicine | Admitting: Family Medicine

## 2017-03-22 DIAGNOSIS — N63 Unspecified lump in unspecified breast: Secondary | ICD-10-CM

## 2017-03-22 LAB — HM MAMMOGRAPHY

## 2017-04-12 LAB — HM HEPATITIS C SCREENING LAB: HM Hepatitis Screen: NEGATIVE

## 2017-06-03 ENCOUNTER — Encounter (HOSPITAL_COMMUNITY): Payer: Self-pay

## 2017-07-11 ENCOUNTER — Other Ambulatory Visit (HOSPITAL_COMMUNITY): Payer: Self-pay | Admitting: General Surgery

## 2017-07-25 ENCOUNTER — Ambulatory Visit (HOSPITAL_COMMUNITY)
Admission: RE | Admit: 2017-07-25 | Discharge: 2017-07-25 | Disposition: A | Discharge status: Home / Self Care | Payer: 59 | Source: Ambulatory Visit | Attending: General Surgery | Admitting: General Surgery

## 2017-07-25 ENCOUNTER — Ambulatory Visit (HOSPITAL_COMMUNITY): Admission: RE | Admit: 2017-07-25 | Payer: 59 | Source: Ambulatory Visit

## 2017-08-06 ENCOUNTER — Ambulatory Visit (HOSPITAL_COMMUNITY)
Admission: RE | Admit: 2017-08-06 | Discharge: 2017-08-06 | Disposition: A | Discharge status: Home / Self Care | Payer: 59 | Source: Ambulatory Visit | Attending: General Surgery | Admitting: General Surgery

## 2017-08-06 ENCOUNTER — Other Ambulatory Visit: Payer: Self-pay

## 2017-08-06 DIAGNOSIS — Z01818 Encounter for other preprocedural examination: Secondary | ICD-10-CM | POA: Insufficient documentation

## 2017-08-06 DIAGNOSIS — K224 Dyskinesia of esophagus: Secondary | ICD-10-CM | POA: Diagnosis not present

## 2017-08-06 DIAGNOSIS — Z9884 Bariatric surgery status: Secondary | ICD-10-CM | POA: Diagnosis not present

## 2017-09-17 ENCOUNTER — Ambulatory Visit (HOSPITAL_COMMUNITY)
Admission: RE | Admit: 2017-09-17 | Discharge: 2017-09-17 | Disposition: A | Discharge status: Home / Self Care | Payer: 59 | Source: Ambulatory Visit | Attending: Cardiovascular Disease | Admitting: Cardiovascular Disease

## 2017-09-17 ENCOUNTER — Other Ambulatory Visit (HOSPITAL_COMMUNITY): Payer: Self-pay | Admitting: Radiology

## 2017-09-17 DIAGNOSIS — I6523 Occlusion and stenosis of bilateral carotid arteries: Secondary | ICD-10-CM | POA: Insufficient documentation

## 2018-02-24 DIAGNOSIS — Z6841 Body Mass Index (BMI) 40.0 and over, adult: Secondary | ICD-10-CM | POA: Insufficient documentation

## 2018-05-22 ENCOUNTER — Encounter (HOSPITAL_COMMUNITY): Payer: Self-pay

## 2018-05-22 ENCOUNTER — Emergency Department (HOSPITAL_COMMUNITY)
Admission: EM | Admit: 2018-05-22 | Discharge: 2018-05-22 | Disposition: A | Discharge status: Home / Self Care | Payer: 59 | Attending: Physician Assistant | Admitting: Physician Assistant

## 2018-05-22 ENCOUNTER — Other Ambulatory Visit: Payer: Self-pay

## 2018-05-22 DIAGNOSIS — Y9389 Activity, other specified: Secondary | ICD-10-CM | POA: Insufficient documentation

## 2018-05-22 DIAGNOSIS — Y999 Unspecified external cause status: Secondary | ICD-10-CM | POA: Diagnosis not present

## 2018-05-22 DIAGNOSIS — Z79899 Other long term (current) drug therapy: Secondary | ICD-10-CM | POA: Insufficient documentation

## 2018-05-22 DIAGNOSIS — S80812A Abrasion, left lower leg, initial encounter: Secondary | ICD-10-CM | POA: Diagnosis not present

## 2018-05-22 DIAGNOSIS — W5503XA Scratched by cat, initial encounter: Secondary | ICD-10-CM | POA: Insufficient documentation

## 2018-05-22 DIAGNOSIS — I10 Essential (primary) hypertension: Secondary | ICD-10-CM | POA: Insufficient documentation

## 2018-05-22 DIAGNOSIS — E039 Hypothyroidism, unspecified: Secondary | ICD-10-CM | POA: Insufficient documentation

## 2018-05-22 DIAGNOSIS — Z8673 Personal history of transient ischemic attack (TIA), and cerebral infarction without residual deficits: Secondary | ICD-10-CM | POA: Diagnosis not present

## 2018-05-22 DIAGNOSIS — Y92009 Unspecified place in unspecified non-institutional (private) residence as the place of occurrence of the external cause: Secondary | ICD-10-CM | POA: Insufficient documentation

## 2018-05-22 DIAGNOSIS — J45909 Unspecified asthma, uncomplicated: Secondary | ICD-10-CM | POA: Insufficient documentation

## 2018-05-22 MED ORDER — CLINDAMYCIN HCL 150 MG PO CAPS
300.0000 mg | ORAL_CAPSULE | Freq: Once | ORAL | Status: AC
  Administered 2018-05-22: 300 mg via ORAL
  Filled 2018-05-22: qty 2

## 2018-05-22 MED ORDER — DIPHENHYDRAMINE HCL 25 MG PO CAPS
25.0000 mg | ORAL_CAPSULE | Freq: Once | ORAL | Status: AC
  Administered 2018-05-22: 25 mg via ORAL
  Filled 2018-05-22: qty 1

## 2018-05-22 MED ORDER — CLINDAMYCIN HCL 300 MG PO CAPS: 300.0000 mg | ORAL_CAPSULE | Freq: Four times a day (QID) | ORAL | 0 refills | Status: DC

## 2018-05-22 NOTE — ED Triage Notes (Addendum)
Pt presents with cat scratch/bite to L calf at 2040 today.  Pt reports she was walking her dog when cat attacked her dog and she attempted to get cat off her dog.  She reports neighbor told her the cat was up to date on vaccines.  Pt reports h/o asthma with chest tightness.

## 2018-05-22 NOTE — ED Notes (Signed)
Wound Care performed.

## 2018-05-22 NOTE — ED Notes (Signed)
Pt verbalized understanding discharge instructions and denies any further needs or questions at this time. VS stable, ambulatory and steady gait.   See EDP assessment / note.   

## 2018-05-22 NOTE — Discharge Instructions (Signed)
Recheck at Urgent care in 2 days  

## 2018-05-23 NOTE — ED Provider Notes (Signed)
[facility] EMERGENCY DEPARTMENT Provider Note   CSN: [id number] Arrival date & time: 05/22/18  2120     History   Chief Complaint No chief complaint on file.   HPI Alison [name] is a 57 y.o. female.  The history is provided by the patient. No language interpreter was used.  Animal Bite  Contact animal:  Cat Location:  Leg Leg injury location:  L lower leg Pain details:    Quality:  Aching   Severity:  Mild   Timing:  Constant Incident location:  Home Notifications:  Animal control Tetanus status:  Up to date Relieved by:  Nothing Worsened by:  Nothing Ineffective treatments:  None tried Pt reports the neighbors cat attacked her dog.  Pt tried to get cat off of dog.  Cat scratched pt and may have bitten her.  Pt has had rabies immunizations in the past.   however  Cat is known.   Past Medical History:  Diagnosis Date  . Anemia    prior to hysterectomy   . Anxiety   . Asthma   . Breast mass    lt breast lump  . Contact lens/glasses fitting   . Heart murmur   . Hx-TIA (transient ischemic attack) 1992  . Hyperlipidemia   . Hypertension   . Hypothyroidism   . Obesity   . Polycystic ovarian syndrome   . Thyroid disease     Patient Active Problem List   Diagnosis Date Noted  . Obesity 06/27/2011  . HTN (hypertension) 06/27/2011  . Hypercholesteremia 06/27/2011  . Hypothyroidism 06/27/2011  . Asthma 06/27/2011    Past Surgical History:  Procedure Laterality Date  . ABDOMINAL HYSTERECTOMY  2009   Partial  . FEMUR FRACTURE SURGERY  2008   Left  . KNEE CARTILAGE SURGERY  2005  . LAPAROSCOPIC GASTRIC BANDING  11/12/2011   Procedure: LAPAROSCOPIC GASTRIC BANDING;  Surgeon: Judieth Keens, DO;  Location: [facility];  Service: General;  Laterality: N/A;  nathanson liver retractor/ endostitch available 2-0 surgidac refills/ layton needle drivers Economist to P[employer] consultant  . TONSILLECTOMY  2007     OB History   None      Home  Medications    Prior to Admission medications   Medication Sig Start Date End Date Taking? Authorizing Provider  albuterol (PROVENTIL HFA;VENTOLIN HFA) 108 (90 BASE) MCG/ACT inhaler Inhale 2 puffs into the lungs every 6 (six) hours as needed. For asthma    [provider]  ALPRAZolam (XANAX) 0.25 MG tablet Take 0.25 mg by mouth 2 (two) times daily as needed for anxiety.    [provider]  amLODipine (NORVASC) 5 MG tablet Take 5 mg by mouth every morning.     [provider]  clindamycin (CLEOCIN) 300 MG capsule Take 1 capsule (300 mg total) by mouth every 6 (six) hours. 05/22/18   Fransico Meadow, PA-C  escitalopram (LEXAPRO) 10 MG tablet Take 10 mg by mouth daily.    [provider]  hydrochlorothiazide 25 MG tablet Take 25 mg by mouth every other day.     [provider]  levothyroxine (SYNTHROID, LEVOTHROID) 125 MCG tablet Take 125 mcg by mouth every morning.     [provider]  losartan (COZAAR) 50 MG tablet Take 50 mg by mouth every morning.     [provider]  Multiple Vitamins-Minerals (MULTIVITAMIN WITH MINERALS) tablet Take 1 tablet by mouth daily.    [provider]  PARoxetine (PAXIL) 20  MG tablet Take 20 mg by mouth every morning.     [provider]  rosuvastatin (CRESTOR) 40 MG tablet Take 40 mg by mouth daily.    [provider]    Family History Family History  Problem Relation Age of Onset  . Anesthesia problems Sister   . Cancer Maternal Aunt        Breast Cancer  . Breast cancer Maternal Aunt 59    Social History Social History   Tobacco Use  . Smoking status: Never Smoker  . Smokeless tobacco: Never Used  Substance Use Topics  . Alcohol use: No    Comment: occasionally  . Drug use: No     Allergies   Aspirin and Penicillins   Review of Systems Review of Systems  All other systems reviewed and are negative.    Physical Exam Updated Vital Signs BP 135/89    Pulse 82   Temp 98.3 F (36.8 C) (Oral)   Resp 18   Ht 5\' 2"  (1.575 m)   Wt 101.6 kg (224 lb)   SpO2 98%   BMI 40.97 kg/m   Physical Exam  Constitutional: She appears well-developed and well-nourished.  HENT:  Head: Normocephalic.  Eyes: Pupils are equal, round, and reactive to light.  Musculoskeletal: She exhibits tenderness.  Multiple abrasions,  Small area on back of leg that may be a puncture wound.   Neurological: She is alert.  Skin: Skin is warm.  Psychiatric: She has a normal mood and affect.  Nursing note and vitals reviewed.    ED Treatments / Results  Labs (all labs ordered are listed, but only abnormal results are displayed) Labs Reviewed - No data to display  EKG None  Radiology No results found.  Procedures Procedures (including critical care time)  Medications Ordered in ED Medications  diphenhydrAMINE (BENADRYL) capsule 25 mg (25 mg Oral Given 05/22/18 2259)  clindamycin (CLEOCIN) capsule 300 mg (300 mg Oral Given 05/22/18 2259)     Initial Impression / Assessment and Plan / ED Course  I have reviewed the triage vital signs and the nursing notes.  Pertinent labs & imaging results that were available during my care of the patient were reviewed by me and considered in my medical decision making (see chart for details).     Pt counseled on risk of infection.  Pt advsied to recheck in 2 days.  Pt given rx for clindamycin  Final Clinical Impressions(s) / ED Diagnoses   Final diagnoses:  Cat scratch of left lower leg, initial encounter    ED Discharge Orders        Ordered    clindamycin (CLEOCIN) 300 MG capsule  Every 6 hours     05/22/18 2229    An After Visit Summary was printed and given to the patient.    Fransico Meadow, [place] 05/23/18 1536    [place], Fredia Sorrow, MD 05/26/18 [id number]

## 2018-05-31 ENCOUNTER — Encounter (HOSPITAL_COMMUNITY): Payer: Self-pay | Admitting: Emergency Medicine

## 2018-05-31 ENCOUNTER — Other Ambulatory Visit: Payer: Self-pay

## 2018-05-31 ENCOUNTER — Inpatient Hospital Stay (HOSPITAL_COMMUNITY)
Admission: EM | Admit: 2018-05-31 | Discharge: 2018-06-02 | DRG: 571 | Disposition: A | Discharge status: Home / Self Care | Payer: 59 | Attending: Internal Medicine | Admitting: Internal Medicine

## 2018-05-31 ENCOUNTER — Emergency Department (HOSPITAL_COMMUNITY): Payer: 59

## 2018-05-31 DIAGNOSIS — E282 Polycystic ovarian syndrome: Secondary | ICD-10-CM | POA: Diagnosis present

## 2018-05-31 DIAGNOSIS — Z8673 Personal history of transient ischemic attack (TIA), and cerebral infarction without residual deficits: Secondary | ICD-10-CM

## 2018-05-31 DIAGNOSIS — E669 Obesity, unspecified: Secondary | ICD-10-CM | POA: Diagnosis present

## 2018-05-31 DIAGNOSIS — Z9884 Bariatric surgery status: Secondary | ICD-10-CM

## 2018-05-31 DIAGNOSIS — I10 Essential (primary) hypertension: Secondary | ICD-10-CM | POA: Diagnosis present

## 2018-05-31 DIAGNOSIS — Z6841 Body Mass Index (BMI) 40.0 and over, adult: Secondary | ICD-10-CM

## 2018-05-31 DIAGNOSIS — D649 Anemia, unspecified: Secondary | ICD-10-CM | POA: Diagnosis not present

## 2018-05-31 DIAGNOSIS — L03116 Cellulitis of left lower limb: Secondary | ICD-10-CM | POA: Diagnosis not present

## 2018-05-31 DIAGNOSIS — W5501XS Bitten by cat, sequela: Secondary | ICD-10-CM

## 2018-05-31 DIAGNOSIS — Z880 Allergy status to penicillin: Secondary | ICD-10-CM

## 2018-05-31 DIAGNOSIS — W5501XA Bitten by cat, initial encounter: Secondary | ICD-10-CM

## 2018-05-31 DIAGNOSIS — Z803 Family history of malignant neoplasm of breast: Secondary | ICD-10-CM

## 2018-05-31 DIAGNOSIS — Z886 Allergy status to analgesic agent status: Secondary | ICD-10-CM

## 2018-05-31 DIAGNOSIS — E079 Disorder of thyroid, unspecified: Secondary | ICD-10-CM | POA: Diagnosis not present

## 2018-05-31 DIAGNOSIS — S81852A Open bite, left lower leg, initial encounter: Secondary | ICD-10-CM | POA: Diagnosis present

## 2018-05-31 DIAGNOSIS — E039 Hypothyroidism, unspecified: Secondary | ICD-10-CM | POA: Diagnosis present

## 2018-05-31 DIAGNOSIS — Z79890 Hormone replacement therapy (postmenopausal): Secondary | ICD-10-CM

## 2018-05-31 DIAGNOSIS — J45909 Unspecified asthma, uncomplicated: Secondary | ICD-10-CM | POA: Diagnosis present

## 2018-05-31 DIAGNOSIS — Z90711 Acquired absence of uterus with remaining cervical stump: Secondary | ICD-10-CM

## 2018-05-31 DIAGNOSIS — I96 Gangrene, not elsewhere classified: Secondary | ICD-10-CM | POA: Diagnosis present

## 2018-05-31 DIAGNOSIS — S81852S Open bite, left lower leg, sequela: Secondary | ICD-10-CM

## 2018-05-31 DIAGNOSIS — L089 Local infection of the skin and subcutaneous tissue, unspecified: Secondary | ICD-10-CM

## 2018-05-31 DIAGNOSIS — E876 Hypokalemia: Secondary | ICD-10-CM | POA: Diagnosis present

## 2018-05-31 DIAGNOSIS — Z87892 Personal history of anaphylaxis: Secondary | ICD-10-CM

## 2018-05-31 DIAGNOSIS — E785 Hyperlipidemia, unspecified: Secondary | ICD-10-CM | POA: Diagnosis present

## 2018-05-31 DIAGNOSIS — F419 Anxiety disorder, unspecified: Secondary | ICD-10-CM | POA: Diagnosis present

## 2018-05-31 DIAGNOSIS — L02416 Cutaneous abscess of left lower limb: Secondary | ICD-10-CM | POA: Diagnosis present

## 2018-05-31 DIAGNOSIS — W5501XD Bitten by cat, subsequent encounter: Secondary | ICD-10-CM

## 2018-05-31 LAB — CBC WITH DIFFERENTIAL/PLATELET
Abs Immature Granulocytes: 0 10*3/uL (ref 0.0–0.1)
Basophils Absolute: 0 10*3/uL (ref 0.0–0.1)
Basophils Relative: 0 %
Eosinophils Absolute: 0.3 10*3/uL (ref 0.0–0.7)
Eosinophils Relative: 3 %
HCT: 43.4 % (ref 36.0–46.0)
Hemoglobin: 14.7 g/dL (ref 12.0–15.0)
Immature Granulocytes: 0 %
Lymphocytes Relative: 31 %
Lymphs Abs: 2.6 10*3/uL (ref 0.7–4.0)
MCH: 30.6 pg (ref 26.0–34.0)
MCHC: 33.9 g/dL (ref 30.0–36.0)
MCV: 90.4 fL (ref 78.0–100.0)
Monocytes Absolute: 0.5 10*3/uL (ref 0.1–1.0)
Monocytes Relative: 5 %
Neutro Abs: 5.1 10*3/uL (ref 1.7–7.7)
Neutrophils Relative %: 61 %
Platelets: 225 10*3/uL (ref 150–400)
RBC: 4.8 MIL/uL (ref 3.87–5.11)
RDW: 13.2 % (ref 11.5–15.5)
WBC: 8.4 10*3/uL (ref 4.0–10.5)

## 2018-05-31 LAB — COMPREHENSIVE METABOLIC PANEL
ALT: 18 U/L (ref 14–54)
AST: 24 U/L (ref 15–41)
Albumin: 3.4 g/dL — ABNORMAL LOW (ref 3.5–5.0)
Alkaline Phosphatase: 78 U/L (ref 38–126)
Anion gap: 10 (ref 5–15)
BUN: 8 mg/dL (ref 6–20)
CO2: 26 mmol/L (ref 22–32)
Calcium: 9.4 mg/dL (ref 8.9–10.3)
Chloride: 103 mmol/L (ref 101–111)
Creatinine, Ser: 0.92 mg/dL (ref 0.44–1.00)
GFR calc Af Amer: >60 mL/min (ref >60)
GFR calc non Af Amer: >60 mL/min (ref >60)
Glucose, Bld: 170 mg/dL — ABNORMAL HIGH (ref 65–99)
Potassium: 3.4 mmol/L — ABNORMAL LOW (ref 3.5–5.1)
Sodium: 139 mmol/L (ref 135–145)
Total Bilirubin: 0.8 mg/dL (ref 0.3–1.2)
Total Protein: 6.3 g/dL — ABNORMAL LOW (ref 6.5–8.1)

## 2018-05-31 LAB — I-STAT BETA HCG BLOOD, ED (MC, WL, AP ONLY): I-stat hCG, quantitative: <5 m[IU]/mL (ref <5)

## 2018-05-31 LAB — I-STAT CG4 LACTIC ACID, ED
Lactic Acid, Venous: 2.59 mmol/L (ref 0.5–1.9)
Lactic Acid, Venous: 0.99 mmol/L (ref 0.5–1.9)

## 2018-05-31 LAB — SEDIMENTATION RATE: Sed Rate: 25 mm/hr — ABNORMAL HIGH (ref 0–22)

## 2018-05-31 LAB — HEMOGLOBIN A1C
Hgb A1c MFr Bld: 5.7 % — ABNORMAL HIGH (ref 4.8–5.6)
Mean Plasma Glucose: 116.89 mg/dL

## 2018-05-31 MED ORDER — ONDANSETRON HCL 4 MG PO TABS: 4.0000 mg | ORAL_TABLET | Freq: Four times a day (QID) | ORAL | Status: DC | PRN

## 2018-05-31 MED ORDER — METRONIDAZOLE IN NACL 5-0.79 MG/ML-% IV SOLN
500.0000 mg | Freq: Once | INTRAVENOUS | Status: AC
  Administered 2018-05-31: 500 mg via INTRAVENOUS
  Filled 2018-05-31: qty 100

## 2018-05-31 MED ORDER — METRONIDAZOLE IN NACL 5-0.79 MG/ML-% IV SOLN
500.0000 mg | Freq: Three times a day (TID) | INTRAVENOUS | Status: DC
  Administered 2018-05-31 – 2018-06-02 (×5): 500 mg via INTRAVENOUS
  Filled 2018-05-31 (×5): qty 100

## 2018-05-31 MED ORDER — ESCITALOPRAM OXALATE 10 MG PO TABS
10.0000 mg | ORAL_TABLET | Freq: Every day | ORAL | Status: DC
  Administered 2018-05-31 – 2018-06-02 (×2): 10 mg via ORAL
  Filled 2018-05-31 (×2): qty 1

## 2018-05-31 MED ORDER — ACETAMINOPHEN 325 MG PO TABS: 650.0000 mg | ORAL_TABLET | Freq: Four times a day (QID) | ORAL | Status: DC | PRN

## 2018-05-31 MED ORDER — SODIUM CHLORIDE 0.9 % IV BOLUS
1000.0000 mL | Freq: Once | INTRAVENOUS | Status: AC
  Administered 2018-05-31: 1000 mL via INTRAVENOUS

## 2018-05-31 MED ORDER — ALPRAZOLAM 0.25 MG PO TABS: 0.2500 mg | ORAL_TABLET | Freq: Two times a day (BID) | ORAL | Status: DC | PRN

## 2018-05-31 MED ORDER — GABAPENTIN 100 MG PO CAPS
100.0000 mg | ORAL_CAPSULE | Freq: Every day | ORAL | Status: DC
  Administered 2018-05-31 – 2018-06-02 (×2): 100 mg via ORAL
  Filled 2018-05-31 (×2): qty 1

## 2018-05-31 MED ORDER — ADULT MULTIVITAMIN W/MINERALS CH
1.0000 | ORAL_TABLET | Freq: Every day | ORAL | Status: DC
  Filled 2018-05-31: qty 1

## 2018-05-31 MED ORDER — ALBUTEROL SULFATE HFA 108 (90 BASE) MCG/ACT IN AERS: 2.0000 | INHALATION_SPRAY | Freq: Four times a day (QID) | RESPIRATORY_TRACT | Status: DC | PRN

## 2018-05-31 MED ORDER — ALBUTEROL SULFATE (2.5 MG/3ML) 0.083% IN NEBU: 2.5000 mg | INHALATION_SOLUTION | RESPIRATORY_TRACT | Status: DC | PRN

## 2018-05-31 MED ORDER — ONDANSETRON HCL 4 MG/2ML IJ SOLN: 4.0000 mg | Freq: Four times a day (QID) | INTRAMUSCULAR | Status: DC | PRN

## 2018-05-31 MED ORDER — ENOXAPARIN SODIUM 40 MG/0.4ML ~~LOC~~ SOLN
40.0000 mg | SUBCUTANEOUS | Status: DC
  Administered 2018-05-31 – 2018-06-01 (×2): 40 mg via SUBCUTANEOUS
  Filled 2018-05-31 (×2): qty 0.4

## 2018-05-31 MED ORDER — AMLODIPINE BESYLATE 5 MG PO TABS
5.0000 mg | ORAL_TABLET | Freq: Every day | ORAL | Status: DC
  Administered 2018-05-31 – 2018-06-02 (×2): 5 mg via ORAL
  Filled 2018-05-31 (×2): qty 1

## 2018-05-31 MED ORDER — ADULT MULTIVITAMIN W/MINERALS CH
1.0000 | ORAL_TABLET | Freq: Every day | ORAL | Status: DC
  Administered 2018-05-31 – 2018-06-02 (×2): 1 via ORAL
  Filled 2018-05-31 (×2): qty 1

## 2018-05-31 MED ORDER — OXYCODONE HCL 5 MG PO TABS: 5.0000 mg | ORAL_TABLET | ORAL | Status: DC | PRN

## 2018-05-31 MED ORDER — SODIUM CHLORIDE 0.9 % IV SOLN
1.0000 g | INTRAVENOUS | Status: DC
  Administered 2018-06-01: 1 g via INTRAVENOUS
  Filled 2018-05-31 (×3): qty 10

## 2018-05-31 MED ORDER — MUPIROCIN 2 % EX OINT: 1.0000 "application " | TOPICAL_OINTMENT | Freq: Two times a day (BID) | CUTANEOUS | Status: DC

## 2018-05-31 MED ORDER — LEVOTHYROXINE SODIUM 112 MCG PO TABS
112.0000 ug | ORAL_TABLET | Freq: Every day | ORAL | Status: DC
Start: 2018-06-01 — End: 2018-06-02
  Administered 2018-06-02: 112 ug via ORAL
  Filled 2018-05-31: qty 1

## 2018-05-31 MED ORDER — IOHEXOL 300 MG/ML  SOLN
100.0000 mL | Freq: Once | INTRAMUSCULAR | Status: AC | PRN
  Administered 2018-05-31: 100 mL via INTRAVENOUS

## 2018-05-31 MED ORDER — PROMETHAZINE HCL 25 MG/ML IJ SOLN
12.5000 mg | Freq: Once | INTRAMUSCULAR | Status: AC
  Administered 2018-05-31: 12.5 mg via INTRAVENOUS
  Filled 2018-05-31: qty 1

## 2018-05-31 MED ORDER — ROSUVASTATIN CALCIUM 20 MG PO TABS
40.0000 mg | ORAL_TABLET | Freq: Every day | ORAL | Status: DC
  Administered 2018-05-31 – 2018-06-02 (×2): 40 mg via ORAL
  Filled 2018-05-31: qty 2
  Filled 2018-05-31 (×2): qty 1
  Filled 2018-05-31: qty 2

## 2018-05-31 MED ORDER — SODIUM CHLORIDE 0.9 % IV SOLN
1.0000 g | Freq: Once | INTRAVENOUS | Status: AC
  Administered 2018-05-31: 1 g via INTRAVENOUS
  Filled 2018-05-31: qty 10

## 2018-05-31 MED ORDER — ACETAMINOPHEN 650 MG RE SUPP: 650.0000 mg | Freq: Four times a day (QID) | RECTAL | Status: DC | PRN

## 2018-05-31 MED ORDER — SODIUM CHLORIDE 0.9 % IV SOLN
INTRAVENOUS | Status: DC
  Administered 2018-05-31 – 2018-06-02 (×4): via INTRAVENOUS

## 2018-05-31 MED ORDER — SODIUM CHLORIDE 0.9% FLUSH
3.0000 mL | Freq: Two times a day (BID) | INTRAVENOUS | Status: DC
  Administered 2018-05-31 (×2): 3 mL via INTRAVENOUS

## 2018-05-31 NOTE — Consult Note (Signed)
Reason for Consult:  Left calf wound s/p cat bite Referring Physician: Nanda Quinton, MD/EDP  Alison [name] is an 57 y.o. female.  HPI:   57 yo female who was bitten by a neighborhood cat last week.  Has been on oral antibiotics for at least 9 days (clinda).  With continued left calf pain at the bite site as well as redness and purulent drainage, she presented to the ED for further evaluation and treatment.  Ortho is consulted to assess her left leg wound.  She does report pain around the bite site.  Past Medical History:  Diagnosis Date  . Anemia    prior to hysterectomy   . Anxiety   . Asthma   . Breast mass    lt breast lump  . Contact lens/glasses fitting   . Heart murmur   . Hx-TIA (transient ischemic attack) 1992  . Hyperlipidemia   . Hypertension   . Hypothyroidism   . Obesity   . Polycystic ovarian syndrome   . Thyroid disease     Past Surgical History:  Procedure Laterality Date  . ABDOMINAL HYSTERECTOMY  2009   Partial  . FEMUR FRACTURE SURGERY  2008   Left  . KNEE CARTILAGE SURGERY  2005  . LAPAROSCOPIC GASTRIC BANDING  11/12/2011   Procedure: LAPAROSCOPIC GASTRIC BANDING;  Surgeon: Judieth Keens, DO;  Location: [facility];  Service: General;  Laterality: N/A;  nathanson liver retractor/ endostitch available 2-0 surgidac refills/ layton needle drivers Economist to Pensions consultant  . TONSILLECTOMY  2007    Family History  Problem Relation Age of Onset  . Anesthesia problems Sister   . Cancer Maternal Aunt        Breast Cancer  . Breast cancer Maternal Aunt 33    Social History:  reports that she has never smoked. She has never used smokeless tobacco. She reports that she does not drink alcohol or use drugs.  Allergies:  Allergies  Allergen Reactions  . Aspirin Anaphylaxis    REACTION: Anaphlactic reaction.  [name] Penicillins Itching    Medications: I have reviewed the patient's current medications.  Results for orders placed or performed during the  hospital encounter of 05/31/18 (from the past 48 hour(s))  Comprehensive metabolic panel     Status: Abnormal   Collection Time: 05/31/18 11:13 AM  Result Value Ref Range   Sodium 139 135 - 145 mmol/L   Potassium 3.4 (L) 3.5 - 5.1 mmol/L   Chloride 103 101 - 111 mmol/L   CO2 26 22 - 32 mmol/L   Glucose, Bld 170 (H) 65 - 99 mg/dL   BUN 8 6 - 20 mg/dL   Creatinine, Ser 0.92 0.44 - 1.00 mg/dL   Calcium 9.4 8.9 - 10.3 mg/dL   Total Protein 6.3 (L) 6.5 - 8.1 g/dL   Albumin 3.4 (L) 3.5 - 5.0 g/dL   AST 24 15 - 41 U/L   ALT 18 14 - 54 U/L   Alkaline Phosphatase 78 38 - 126 U/L   Total Bilirubin 0.8 0.3 - 1.2 mg/dL   GFR calc non Af Amer >60 >60 mL/min   GFR calc Af Amer >60 >60 mL/min    Comment: (NOTE) The eGFR has been calculated using the CKD EPI equation. This calculation has not been validated in all clinical situations. eGFR's persistently <60 mL/min signify possible Chronic Kidney Disease.    Anion gap 10 5 - 15    Comment: Performed at [facility]  [address] [place], [place] [address]  CBC with Differential     Status: None   Collection Time: 05/31/18 11:13 AM  Result Value Ref Range   WBC 8.4 4.0 - 10.5 K/uL   RBC 4.80 3.87 - 5.11 MIL/uL   Hemoglobin 14.7 12.0 - 15.0 g/dL   HCT 43.4 36.0 - 46.0 %   MCV 90.4 78.0 - 100.0 fL   MCH 30.6 26.0 - 34.0 pg   MCHC 33.9 30.0 - 36.0 g/dL   RDW 13.2 11.5 - 15.5 %   Platelets 225 150 - 400 K/uL   Neutrophils Relative % 61 %   Neutro Abs 5.1 1.7 - 7.7 K/uL   Lymphocytes Relative 31 %   Lymphs Abs 2.6 0.7 - 4.0 K/uL   Monocytes Relative 5 %   Monocytes Absolute 0.5 0.1 - 1.0 K/uL   Eosinophils Relative 3 %   Eosinophils Absolute 0.3 0.0 - 0.7 K/uL   Basophils Relative 0 %   Basophils Absolute 0.0 0.0 - 0.1 K/uL   Immature Granulocytes 0 %   Abs Immature Granulocytes 0.0 0.0 - 0.1 K/uL    Comment: Performed at [facility], [address], [place], [place] [address]  I-Stat beta hCG blood, ED     Status: None    Collection Time: 05/31/18 11:24 AM  Result Value Ref Range   I-stat hCG, quantitative <5.0 <5 mIU/mL   Comment 3            Comment:   GEST. AGE      CONC.  (mIU/mL)   <=1 WEEK        5 - 50     2 WEEKS       50 - 500     3 WEEKS       100 - 10,000     4 WEEKS     1,000 - 30,000        FEMALE AND NON-PREGNANT FEMALE:     LESS THAN 5 mIU/mL   I-Stat CG4 Lactic Acid, ED     Status: Abnormal   Collection Time: 05/31/18 11:26 AM  Result Value Ref Range   Lactic Acid, Venous 2.59 (HH) 0.5 - 1.9 mmol/L   Comment NOTIFIED PHYSICIAN   I-Stat CG4 Lactic Acid, ED     Status: None   Collection Time: 05/31/18 12:56 PM  Result Value Ref Range   Lactic Acid, Venous 0.99 0.5 - 1.9 mmol/L    Ct Tibia Fibula Left W Contrast  Result Date: 05/31/2018 CLINICAL DATA:  Cat bite to the left calf on 05/22/2018. Pain, swelling and wound drainage. EXAM: CT OF THE LOWER RIGHT EXTREMITY WITH CONTRAST TECHNIQUE: Multidetector CT imaging of the lower right extremity was performed according to the standard protocol following intravenous contrast administration. COMPARISON:  None. CONTRAST:  133m OMNIPAQUE IOHEXOL 300 MG/ML  SOLN FINDINGS: Patchy areas of subcutaneous soft tissue swelling/fluid mainly aspect. This is likely cellulitis. More focal area skin thickening and subcutaneous edema involving the upper left calf may be the area of the injury. No focal rim enhancing fluid collection to suggest a drainable abscess. No radiodense foreign body is identified. No findings for myofasciitis or pyomyositis. No definite findings for septic arthritis or osteomyelitis. There is a small right knee joint effusion and moderate knee joint degenerative changes. The ankle joint appears intact. The major vascular structures appear normal. IMPRESSION: 1. Cellulitis but no discrete drainable soft tissue abscess. 2. No radiopaque foreign body. 3. No CT findings  to suggest pyomyositis, septic arthritis or osteomyelitis. Electronically  Signed   By: Marijo Sanes M.D.   On: 05/31/2018 14:39    Review of Systems  All other systems reviewed and are negative.  Blood pressure 122/71, pulse (!) 58, temperature 98.6 F (37 C), temperature source Oral, resp. rate 16, SpO2 99 %. Physical Exam  Constitutional: She is oriented to person, place, and time. She appears well-developed and well-nourished.  HENT:  Head: Normocephalic and atraumatic.  Neck: Normal range of motion.  Cardiovascular: Normal rate.  Respiratory: Effort normal.  GI: Soft.  Musculoskeletal:       Legs: Neurological: She is alert and oriented to person, place, and time.  Skin: Skin is warm and dry.  Psychiatric: She has a normal mood and affect.   The patient is non-septic appearing.  No evidence of compartment syndrome.  Foot well-perfused with normal motor/sensation   Assessment/Plan: Left calf wound from cat bite with cellulitis and infection  I spoke with the patient in length about the need for a simple irrigation/debridement of this left leg cat bite wound given the failure of oral antibiotics.  Since she has eaten some and is non-septic appearing, will post her for surgery tomorrow morning.  She is being admitted graciously to the Medicine Service for IV antibiotics.  I anticipate only a small surgery and then likely discharge to home Monday.  Mcarthur Rossetti 05/31/2018, 4:02 PM

## 2018-05-31 NOTE — H&P (Signed)
History and Physical    Alison [name]:[id number] DOB: 10/10/1961 DOA: 05/31/2018  **Will place in observation based on the expectation that the patient will need hospitalization/ hospital care for less than or equal to 24 hours  PCP: [facility]   Attending physician: Jonnie Finner  Patient coming from/Resides with: Private residence  Chief Complaint: Infected, failed outpatient therapy  HPI: Alison [name] is a 57 y.o. female with medical history significant for tension, dyslipidemia, low-grade anemia and hypothyroidism.  Patient initially presented to the ER on 6/13 after sustaining a cat bite to her left posterior calf region.  The cat had attacked her dog and in an attempt to assist her dog the cat bit her.  She had no significant findings on clinical exam and nothing to warrant admission or IV antibiotics so she was started on oral clindamycin and discharged from the ER.  Patient reports that within 24 hours after discharge she had subtle increase in swelling and since that time is had increasing pain and swelling with intermittent drainage from the puncture wounds.  She presented to the ER today because of the symptoms.  She had no leukocytosis but on exam purulent material was able to be expressed from the wound.  She has subsequently been evaluated by the orthopedic surgeon who plans on surgical exploration of the wound.  Because the patient had eaten today plans are to proceed with surgical intervention tomorrow.  He has been started empirically on IV Rocephin and Flagyl.  ED Course:  Vital Signs: BP 122/71   Pulse (!) 58   Temp 98.6 F (37 C) (Oral)   Resp 16   SpO2 99%  CT tibia and fibula left: Cellulitic changes without discrete drainable soft tissue abscess, no foreign body: No evidence to suggest pyomyositis, septic arthritis or osteomyelitis Lab data: Sodium 139, potassium 3.4, chloride 103, CO2 26, glucose 170, BUN 8, creatinine 0.92, LFTs not  elevated, initial serum lactate 2.59 down to 0.99 after initiation of treatment in ER, white count 8400 differential in the context of concurrent outpatient antibiotic therapy, hemoglobin 14.7, platelets 225,000, blood cultures obtained in the ER Medications and treatments: NS bolus x1 L, Rocephin 1 g IV x1, Flagyl 500 mg IV x1, Phenergan 12.5 mg IV x1  Review of Systems:  In addition to the HPI above,  No Fever-chills, myalgias or other constitutional symptoms No Headache, changes with Vision or hearing, new weakness, tingling, numbness in any extremity, dizziness, dysarthria or word finding difficulty, gait disturbance or imbalance, tremors or seizure activity No problems swallowing food or Liquids, indigestion/reflux, choking or coughing while eating, abdominal pain with or after eating No Chest pain, Cough or Shortness of Breath, palpitations, orthopnea or DOE No Abdominal pain, N/V, melena,hematochezia, dark tarry stools, constipation No dysuria, malodorous urine, hematuria or flank pain No new skin rashes,masses or bruises, No new joint pains, aches, swelling or redness No recent unintentional weight gain or loss No polyuria, polydypsia or polyphagia   Past Medical History:  Diagnosis Date  . Anemia    prior to hysterectomy   . Anxiety   . Asthma   . Breast mass    lt breast lump  . Contact lens/glasses fitting   . Heart murmur   . Hx-TIA (transient ischemic attack) 1992  . Hyperlipidemia   . Hypertension   . Hypothyroidism   . Obesity   . Polycystic ovarian syndrome   . Thyroid disease     Past Surgical History:  Procedure Laterality Date  . ABDOMINAL HYSTERECTOMY  2009   Partial  . FEMUR FRACTURE SURGERY  2008   Left  . KNEE CARTILAGE SURGERY  2005  . LAPAROSCOPIC GASTRIC BANDING  11/12/2011   Procedure: LAPAROSCOPIC GASTRIC BANDING;  Surgeon: Judieth Keens, DO;  Location: [facility];  Service: General;  Laterality: N/A;  nathanson liver retractor/ endostitch  available 2-0 surgidac refills/ layton needle drivers Economist to Pensions consultant  . TONSILLECTOMY  2007    Social History   Socioeconomic History  . Marital status: Single    Spouse name: Not on file  . Number of children: Not on file  . Years of education: Not on file  . Highest education level: Not on file  Occupational History  . Not on file  Social Needs  . Financial resource strain: Not on file  . Food insecurity:    Worry: Not on file    Inability: Not on file  . Transportation needs:    Medical: Not on file    Non-medical: Not on file  Tobacco Use  . Smoking status: Never Smoker  . Smokeless tobacco: Never Used  Substance and Sexual Activity  . Alcohol use: No    Comment: occasionally  . Drug use: No  . Sexual activity: Not on file  Lifestyle  . Physical activity:    Days per week: Not on file    Minutes per session: Not on file  . Stress: Not on file  Relationships  . Social connections:    Talks on phone: Not on file    Gets together: Not on file    Attends religious service: Not on file    Active member of club or organization: Not on file    Attends meetings of clubs or organizations: Not on file    Relationship status: Not on file  . Intimate partner violence:    Fear of current or ex partner: Not on file    Emotionally abused: Not on file    Physically abused: Not on file    Forced sexual activity: Not on file  Other Topics Concern  . Not on file  Social History Narrative  . Not on file    Mobility: Independent Work history: Not obtained   Allergies  Allergen Reactions  . Aspirin Anaphylaxis    REACTION: Anaphlactic reaction.  [name] Penicillins Itching    Family History  Problem Relation Age of Onset  . Anesthesia problems Sister   . Cancer Maternal Aunt        Breast Cancer  . Breast cancer Maternal Aunt 37     Prior to Admission medications   Medication Sig Start Date End Date Taking? Authorizing Provider  albuterol  (PROVENTIL HFA;VENTOLIN HFA) 108 (90 BASE) MCG/ACT inhaler Inhale 2 puffs into the lungs every 6 (six) hours as needed. For asthma   Yes [provider]  ALPRAZolam (XANAX) 0.25 MG tablet Take 0.25 mg by mouth 2 (two) times daily as needed for anxiety.   Yes [provider]  amLODipine (NORVASC) 5 MG tablet Take 5 mg by mouth every morning.    Yes [provider]  clindamycin (CLEOCIN) 300 MG capsule Take 1 capsule (300 mg total) by mouth every 6 (six) hours. 05/22/18  Yes Caryl Ada K, PA-C  escitalopram (LEXAPRO) 10 MG tablet Take 10 mg by mouth daily.   Yes [provider]  gabapentin (NEURONTIN) 100 MG capsule Take 100 mg by mouth daily. 05/27/18  Yes [provider]  hydrochlorothiazide 25 MG tablet Take 25 mg by mouth every other day.    Yes [provider]  levothyroxine (SYNTHROID, LEVOTHROID) 112 MCG tablet Take 112 mcg by mouth every morning.    Yes [provider]  rosuvastatin (CRESTOR) 40 MG tablet Take 40 mg by mouth daily.   Yes [provider]  Multiple Vitamins-Minerals (MULTIVITAMIN WITH MINERALS) tablet Take 1 tablet by mouth daily.    [provider]    Physical Exam: Vitals:   05/31/18 1401 05/31/18 1402 05/31/18 1415 05/31/18 1445  BP:   116/73 122/71  Pulse: 66 63 60 (!) 58  Resp:   16   Temp:      TempSrc:      SpO2: 100% 99% 99% 99%      Constitutional: NAD, calm, comfortable Eyes: PERRL, lids and conjunctivae normal ENMT: Mucous membranes are moist. Posterior pharynx clear of any exudate or lesions.Normal dentition.  Neck: normal, supple, no masses, no thyromegaly Respiratory: clear to auscultation bilaterally, no wheezing, no crackles. Normal respiratory effort. No accessory muscle use.  Cardiovascular: Regular rate and rhythm, no murmurs / rubs / gallops. No extremity edema. 2+ pedal pulses. No carotid bruits.  Abdomen: no tenderness, no masses palpated. No hepatosplenomegaly.  Bowel sounds positive.  Musculoskeletal: no clubbing / cyanosis. No joint deformity upper and lower extremities. Good ROM, no contractures. Normal muscle tone.  Skin: Patient with edematous left calf with 2 puncture wounds on posterior aspect with surrounding edema and erythema-area quite tender to minimal palpation-orthopedic surgeon previously expressed yellow purulent drainage during exam Neurologic: CN 2-12 grossly intact. Sensation intact, DTR normal. Strength 5/5 x all 4 extremities.  Psychiatric: Normal judgment and insight. Alert and oriented x 3. Normal mood.    Labs on Admission: I have personally reviewed following labs and imaging studies  CBC: Recent Labs  Lab 05/31/18 1113  WBC 8.4  NEUTROABS 5.1  HGB 14.7  HCT 43.4  MCV 90.4  PLT 637   Basic Metabolic Panel: Recent Labs  Lab 05/31/18 1113  NA 139  K 3.4*  CL 103  CO2 26  GLUCOSE 170*  BUN 8  CREATININE 0.92  CALCIUM 9.4   GFR: Estimated Creatinine Clearance: 76.2 mL/min (by C-G formula based on SCr of 0.92 mg/dL). Liver Function Tests: Recent Labs  Lab 05/31/18 1113  AST 24  ALT 18  ALKPHOS 78  BILITOT 0.8  PROT 6.3*  ALBUMIN 3.4*   No results for input(s): LIPASE, AMYLASE in the last 168 hours. No results for input(s): AMMONIA in the last 168 hours. Coagulation Profile: No results for input(s): INR, PROTIME in the last 168 hours. Cardiac Enzymes: No results for input(s): CKTOTAL, CKMB, CKMBINDEX, TROPONINI in the last 168 hours. BNP (last 3 results) No results for input(s): PROBNP in the last 8760 hours. HbA1C: No results for input(s): HGBA1C in the last 72 hours. CBG: No results for input(s): GLUCAP in the last 168 hours. Lipid Profile: No results for input(s): CHOL, HDL, LDLCALC, TRIG, CHOLHDL, LDLDIRECT in the last 72 hours. Thyroid Function Tests: No results for input(s): TSH, T4TOTAL, FREET4, T3FREE, THYROIDAB in the last 72 hours. Anemia Panel: No results for input(s): VITAMINB12,  FOLATE, FERRITIN, TIBC, IRON, RETICCTPCT in the last 72 hours. Urine analysis: No results found for: COLORURINE, APPEARANCEUR, LABSPEC, PHURINE, GLUCOSEU, HGBUR, BILIRUBINUR, KETONESUR, PROTEINUR, UROBILINOGEN, NITRITE, LEUKOCYTESUR Sepsis Labs: _0 (procalcitonin:4,lacticidven:4) )No results found for this or any previous visit (from the past 240 hour(s)).   Radiological Exams on Admission: Ct  Tibia Fibula Left W Contrast  Result Date: 05/31/2018 CLINICAL DATA:  Cat bite to the left calf on 05/22/2018. Pain, swelling and wound drainage. EXAM: CT OF THE LOWER RIGHT EXTREMITY WITH CONTRAST TECHNIQUE: Multidetector CT imaging of the lower right extremity was performed according to the standard protocol following intravenous contrast administration. COMPARISON:  None. CONTRAST:  137m OMNIPAQUE IOHEXOL 300 MG/ML  SOLN FINDINGS: Patchy areas of subcutaneous soft tissue swelling/fluid mainly aspect. This is likely cellulitis. More focal area skin thickening and subcutaneous edema involving the upper left calf may be the area of the injury. No focal rim enhancing fluid collection to suggest a drainable abscess. No radiodense foreign body is identified. No findings for myofasciitis or pyomyositis. No definite findings for septic arthritis or osteomyelitis. There is a small right knee joint effusion and moderate knee joint degenerative changes. The ankle joint appears intact. The major vascular structures appear normal. IMPRESSION: 1. Cellulitis but no discrete drainable soft tissue abscess. 2. No radiopaque foreign body. 3. No CT findings to suggest pyomyositis, septic arthritis or osteomyelitis. Electronically Signed   By: PMarijo SanesM.D.   On: 05/31/2018 14:39     Assessment/Plan Principal Problem:   Infected cat bite of lower leg, left, sequela  -Patient presents after failed outpatient antibiotic therapy with clindamycin for cat bite on 6/13 now with worsening cellulitic changes without CT  evidence of drainable abscess -Orthopedic surgeon planned surgical drainage in a.m. on 6/23 -NPO after MN, IVFs -Anaphylactic reaction to aspirin therefore will not utilize NSAIDs -OxyIR 5 mg every 4 hours prn -Continue Rocephin and Flagyl IV -Follow-up on blood cultures -Obtain ESR -If obtained, please follow-up on intraoperative wound cultures -Serum lactate returned to normal ranges after food resuscitation  Active Problems:   Hypertension -Currently controlled -Continue preadmission Norvasc but hold thiazide diuretic preoperatively    Hyperlipidemia -Continue Crestor    Anemia -Hgb stable at 14.7    Thyroid disease -Continue Synthroid    **Additional lab, imaging and/or diagnostic evaluation at discretion of supervising physician  DVT prophylaxis: Lovenox Code Status: Full Family Communication: No family at bedside Disposition Plan: Home Consults called: Orthopedic surgeon/[place]    [place] ANP-BC [facility] Pager [phone]   If 7PM-7AM, please contact night-coverage [email or web address] Password [email or web address] 05/31/2018, 4:09 PM

## 2018-05-31 NOTE — ED Notes (Signed)
Attempted report 

## 2018-05-31 NOTE — ED Provider Notes (Signed)
Emergency Department Provider Note   I have reviewed the triage vital signs and the nursing notes.   HISTORY  Chief Complaint Wound Infection   HPI Alison [name] is a 57 y.o. female with PMH of HLD, HTN, and PCOS resents to the emergency department for evaluation of worsening pain, redness, drainage from the left calf.  She was scratched and bitten by a cat 8 days ago.  She sought care after the bite and started clindamycin the next day.  She is been compliant with this medicine has some antibiotic remaining.  She denies any fevers or chills.  She continues to have intermittent drainage from the wound but noticed some worsening redness and swelling over the last 3 days.  The scratches on the front of her leg appeared to be improving.  She denies any nausea or vomiting.  No numbness or tingling in the leg or foot.   Past Medical History:  Diagnosis Date  . Anemia    prior to hysterectomy   . Anxiety   . Asthma   . Breast mass    lt breast lump  . Contact lens/glasses fitting   . Heart murmur   . Hx-TIA (transient ischemic attack) 1992  . Hyperlipidemia   . Hypertension   . Hypothyroidism   . Obesity   . Polycystic ovarian syndrome   . Thyroid disease     Patient Active Problem List   Diagnosis Date Noted  . Obesity 06/27/2011  . HTN (hypertension) 06/27/2011  . Hypercholesteremia 06/27/2011  . Hypothyroidism 06/27/2011  . Asthma 06/27/2011    Past Surgical History:  Procedure Laterality Date  . ABDOMINAL HYSTERECTOMY  2009   Partial  . FEMUR FRACTURE SURGERY  2008   Left  . KNEE CARTILAGE SURGERY  2005  . LAPAROSCOPIC GASTRIC BANDING  11/12/2011   Procedure: LAPAROSCOPIC GASTRIC BANDING;  Surgeon: Judieth Keens, DO;  Location: [facility];  Service: General;  Laterality: N/A;  nathanson liver retractor/ endostitch available 2-0 surgidac refills/ layton needle drivers Economist to Pensions consultant  . TONSILLECTOMY  2007    Allergies Aspirin and  Penicillins  Family History  Problem Relation Age of Onset  . Anesthesia problems Sister   . Cancer Maternal Aunt        Breast Cancer  . Breast cancer Maternal Aunt 85    Social History Social History   Tobacco Use  . Smoking status: Never Smoker  . Smokeless tobacco: Never Used  Substance Use Topics  . Alcohol use: No    Comment: occasionally  . Drug use: No    Review of Systems  Constitutional: No fever/chills Eyes: No visual changes. ENT: No sore throat. Cardiovascular: Denies chest pain. Respiratory: Denies shortness of breath. Gastrointestinal: No abdominal pain.  No nausea, no vomiting.  No diarrhea.  No constipation. Genitourinary: Negative for dysuria. Musculoskeletal: Negative for back pain. Skin: Positive left calf pain and drainage.  Neurological: Negative for headaches, focal weakness or numbness.  10-point ROS otherwise negative.  ____________________________________________   PHYSICAL EXAM:  VITAL SIGNS: ED Triage Vitals  Enc Vitals Group     BP 05/31/18 1100 134/79     Pulse Rate 05/31/18 1100 84     Resp 05/31/18 1100 16     Temp 05/31/18 1100 98.3 F (36.8 C)     Temp Source 05/31/18 1100 Oral     SpO2 05/31/18 1100 96 %     Pain Score 05/31/18 1058 4   Constitutional: Alert and oriented.  Well appearing and in no acute distress. Eyes: Conjunctivae are normal. Head: Atraumatic. Nose: No congestion/rhinnorhea. Mouth/Throat: Mucous membranes are moist.  Neck: No stridor. Cardiovascular: Normal rate, regular rhythm. Good peripheral circulation. Grossly normal heart sounds.   Respiratory: Normal respiratory effort.  No retractions. Lungs CTAB. Gastrointestinal: Soft and nontender. No distention.  Musculoskeletal: No lower extremity tenderness nor edema. No gross deformities of extremities. Neurologic:  Normal speech and language. No gross focal neurologic deficits are appreciated.  Skin:  Skin is warm and dry. 10 x 12 cm area of left calf  cellulitis with two puncture type wounds to the posterior calf with surrounding induration. No fluctuance or active drainage/bleeding.      ____________________________________________   LABS (all labs ordered are listed, but only abnormal results are displayed)  Labs Reviewed  COMPREHENSIVE METABOLIC PANEL - Abnormal; Notable for the following components:      Result Value   Potassium 3.4 (*)    Glucose, Bld 170 (*)    Total Protein 6.3 (*)    Albumin 3.4 (*)    All other components within normal limits  I-STAT CG4 LACTIC ACID, ED - Abnormal; Notable for the following components:   Lactic Acid, Venous 2.59 (*)    All other components within normal limits  CULTURE, BLOOD (ROUTINE X 2)  CULTURE, BLOOD (ROUTINE X 2)  CBC WITH DIFFERENTIAL/PLATELET  SEDIMENTATION RATE  I-STAT BETA HCG BLOOD, ED ([facility], [facility], [facility] ONLY)  I-STAT CG4 LACTIC ACID, ED   ____________________________________________  RADIOLOGY  Ct Tibia Fibula Left W Contrast  Result Date: 05/31/2018 CLINICAL DATA:  Cat bite to the left calf on 05/22/2018. Pain, swelling and wound drainage. EXAM: CT OF THE LOWER RIGHT EXTREMITY WITH CONTRAST TECHNIQUE: Multidetector CT imaging of the lower right extremity was performed according to the standard protocol following intravenous contrast administration. COMPARISON:  None. CONTRAST:  110mL OMNIPAQUE IOHEXOL 300 MG/ML  SOLN FINDINGS: Patchy areas of subcutaneous soft tissue swelling/fluid mainly aspect. This is likely cellulitis. More focal area skin thickening and subcutaneous edema involving the upper left calf may be the area of the injury. No focal rim enhancing fluid collection to suggest a drainable abscess. No radiodense foreign body is identified. No findings for myofasciitis or pyomyositis. No definite findings for septic arthritis or osteomyelitis. There is a small right knee joint effusion and moderate knee joint degenerative changes. The ankle joint appears intact. The major  vascular structures appear normal. IMPRESSION: 1. Cellulitis but no discrete drainable soft tissue abscess. 2. No radiopaque foreign body. 3. No CT findings to suggest pyomyositis, septic arthritis or osteomyelitis. Electronically Signed   By: Marijo Sanes M.D.   On: 05/31/2018 14:39    ____________________________________________   PROCEDURES  Procedure(s) performed:   Procedures  CRITICAL CARE Performed by: Margette Fast Total critical care time: 35 minutes Critical care time was exclusive of separately billable procedures and treating other patients. Critical care was necessary to treat or prevent imminent or life-threatening deterioration. Critical care was time spent personally by me on the following activities: development of treatment plan with patient and/or surrogate as well as nursing, discussions with consultants, evaluation of patient's response to treatment, examination of patient, obtaining history from patient or surrogate, ordering and performing treatments and interventions, ordering and review of laboratory studies, ordering and review of radiographic studies, pulse oximetry and re-evaluation of patient's condition.  Nanda Quinton, MD Emergency Medicine  ____________________________________________   INITIAL IMPRESSION / ASSESSMENT AND PLAN / ED COURSE  Pertinent labs &  imaging results that were available during my care of the patient were reviewed by me and considered in my medical decision making (see chart for details).  Patient presents to the emergency department for evaluation of left calf wound which is worsening after cat bite 8 days ago despite being on clindamycin for that time.  She is afebrile here.  Labs show no leukocytosis but lactate is elevated to almost 3.  No vital signs abnormalities.  Went for IV fluids and different antibiotics.  The patient does have a history of rash on the legs after taking penicillin as a teenager.  Symptoms were relatively  minor at that time.  Will try Rocephin and Flagyl as the patient appears to be failing clindamycin.  Will obtain CT scan of the left calf to evaluate for deeper extension of infection into the wound and rule out osteomyelitis.   Labs reviewed. Lactate trended back to normal after 1L IVF. Doubt sepsis clinically with normal vitals. Starting IV abx. Patient with noted penicillin allergy but minor so plan on cephalosporin and flagyl as second line therapy. CT obtained and reviewed showing cellulitis. No abscess or deeper space infection. No osteomyelitis.   03:19 PM Spoke with Dr. Rush Farmer with ortho. He will see in consult but recommending obs and IV abx for now. He will advise further if debridement is necessary.   Discussed patient's case with Hospitalist to request admission. Patient and family (if present) updated with plan. Care transferred to Hospitalist service.  I reviewed all nursing notes, vitals, pertinent old records, EKGs, labs, imaging (as available).  ____________________________________________  FINAL CLINICAL IMPRESSION(S) / ED DIAGNOSES  Final diagnoses:  Cellulitis of left lower extremity  Cat bite, subsequent encounter     MEDICATIONS GIVEN DURING THIS VISIT:  Medications  sodium chloride 0.9 % bolus 1,000 mL (0 mLs Intravenous Stopped 05/31/18 1423)  cefTRIAXone (ROCEPHIN) 1 g in sodium chloride 0.9 % 100 mL IVPB (0 g Intravenous Stopped 05/31/18 1454)  metroNIDAZOLE (FLAGYL) IVPB 500 mg (0 mg Intravenous Stopped 05/31/18 1424)  iohexol (OMNIPAQUE) 300 MG/ML solution 100 mL (100 mLs Intravenous Contrast Given 05/31/18 1332)  promethazine (PHENERGAN) injection 12.5 mg (12.5 mg Intravenous Given 05/31/18 1426)     Note:  This document was prepared using Dragon voice recognition software and may include unintentional dictation errors.  Nanda Quinton, MD Emergency Medicine    Long, [facility], MD 05/31/18 1536

## 2018-05-31 NOTE — ED Triage Notes (Signed)
Pt reports cat bite on 6/13 to L calf, was seen here and placed on clindamycin which she reports she is still taking, noticed wound was become red and swollen about 3 days after she was bitten. Also reports purulent drainage from wound. Denies fevers or chills.

## 2018-05-31 NOTE — ED Notes (Signed)
Patient is resting with call bell in reach  

## 2018-06-01 ENCOUNTER — Encounter (HOSPITAL_COMMUNITY)
Admission: EM | Disposition: A | Discharge status: Home / Self Care | Payer: Self-pay | Source: Home / Self Care | Attending: Internal Medicine

## 2018-06-01 ENCOUNTER — Encounter (HOSPITAL_COMMUNITY): Payer: Self-pay

## 2018-06-01 ENCOUNTER — Observation Stay (HOSPITAL_COMMUNITY): Payer: 59 | Admitting: Certified Registered"

## 2018-06-01 DIAGNOSIS — D649 Anemia, unspecified: Secondary | ICD-10-CM | POA: Diagnosis present

## 2018-06-01 DIAGNOSIS — L03116 Cellulitis of left lower limb: Principal | ICD-10-CM

## 2018-06-01 DIAGNOSIS — E039 Hypothyroidism, unspecified: Secondary | ICD-10-CM | POA: Diagnosis present

## 2018-06-01 DIAGNOSIS — Z6841 Body Mass Index (BMI) 40.0 and over, adult: Secondary | ICD-10-CM | POA: Diagnosis not present

## 2018-06-01 DIAGNOSIS — W5501XS Bitten by cat, sequela: Secondary | ICD-10-CM | POA: Diagnosis not present

## 2018-06-01 DIAGNOSIS — W5501XD Bitten by cat, subsequent encounter: Secondary | ICD-10-CM

## 2018-06-01 DIAGNOSIS — Z8673 Personal history of transient ischemic attack (TIA), and cerebral infarction without residual deficits: Secondary | ICD-10-CM | POA: Diagnosis not present

## 2018-06-01 DIAGNOSIS — E079 Disorder of thyroid, unspecified: Secondary | ICD-10-CM

## 2018-06-01 DIAGNOSIS — L089 Local infection of the skin and subcutaneous tissue, unspecified: Secondary | ICD-10-CM | POA: Diagnosis not present

## 2018-06-01 DIAGNOSIS — Z79890 Hormone replacement therapy (postmenopausal): Secondary | ICD-10-CM | POA: Diagnosis not present

## 2018-06-01 DIAGNOSIS — E282 Polycystic ovarian syndrome: Secondary | ICD-10-CM | POA: Diagnosis present

## 2018-06-01 DIAGNOSIS — S81852A Open bite, left lower leg, initial encounter: Secondary | ICD-10-CM | POA: Diagnosis present

## 2018-06-01 DIAGNOSIS — S81852S Open bite, left lower leg, sequela: Secondary | ICD-10-CM | POA: Diagnosis not present

## 2018-06-01 DIAGNOSIS — Z886 Allergy status to analgesic agent status: Secondary | ICD-10-CM | POA: Diagnosis not present

## 2018-06-01 DIAGNOSIS — E876 Hypokalemia: Secondary | ICD-10-CM | POA: Diagnosis present

## 2018-06-01 DIAGNOSIS — E785 Hyperlipidemia, unspecified: Secondary | ICD-10-CM | POA: Diagnosis present

## 2018-06-01 DIAGNOSIS — Z87892 Personal history of anaphylaxis: Secondary | ICD-10-CM | POA: Diagnosis not present

## 2018-06-01 DIAGNOSIS — I10 Essential (primary) hypertension: Secondary | ICD-10-CM

## 2018-06-01 DIAGNOSIS — Z90711 Acquired absence of uterus with remaining cervical stump: Secondary | ICD-10-CM | POA: Diagnosis not present

## 2018-06-01 DIAGNOSIS — L02416 Cutaneous abscess of left lower limb: Secondary | ICD-10-CM | POA: Diagnosis present

## 2018-06-01 DIAGNOSIS — Z880 Allergy status to penicillin: Secondary | ICD-10-CM | POA: Diagnosis not present

## 2018-06-01 DIAGNOSIS — Z9884 Bariatric surgery status: Secondary | ICD-10-CM | POA: Diagnosis not present

## 2018-06-01 DIAGNOSIS — F419 Anxiety disorder, unspecified: Secondary | ICD-10-CM | POA: Diagnosis present

## 2018-06-01 DIAGNOSIS — J45909 Unspecified asthma, uncomplicated: Secondary | ICD-10-CM | POA: Diagnosis present

## 2018-06-01 DIAGNOSIS — I96 Gangrene, not elsewhere classified: Secondary | ICD-10-CM | POA: Diagnosis present

## 2018-06-01 DIAGNOSIS — E669 Obesity, unspecified: Secondary | ICD-10-CM | POA: Diagnosis present

## 2018-06-01 DIAGNOSIS — Z803 Family history of malignant neoplasm of breast: Secondary | ICD-10-CM | POA: Diagnosis not present

## 2018-06-01 DIAGNOSIS — W5501XA Bitten by cat, initial encounter: Secondary | ICD-10-CM | POA: Diagnosis not present

## 2018-06-01 HISTORY — PX: I&D EXTREMITY: SHX5045

## 2018-06-01 LAB — BASIC METABOLIC PANEL
Anion gap: 8 (ref 5–15)
BUN: 6 mg/dL (ref 6–20)
CO2: 25 mmol/L (ref 22–32)
Calcium: 8.7 mg/dL — ABNORMAL LOW (ref 8.9–10.3)
Chloride: 107 mmol/L (ref 101–111)
Creatinine, Ser: 0.73 mg/dL (ref 0.44–1.00)
GFR calc Af Amer: >60 mL/min (ref >60)
GFR calc non Af Amer: >60 mL/min (ref >60)
Glucose, Bld: 85 mg/dL (ref 65–99)
Potassium: 3.4 mmol/L — ABNORMAL LOW (ref 3.5–5.1)
Sodium: 140 mmol/L (ref 135–145)

## 2018-06-01 LAB — CBC
HCT: 43.2 % (ref 36.0–46.0)
Hemoglobin: 14.1 g/dL (ref 12.0–15.0)
MCH: 30.1 pg (ref 26.0–34.0)
MCHC: 32.6 g/dL (ref 30.0–36.0)
MCV: 92.3 fL (ref 78.0–100.0)
Platelets: 207 10*3/uL (ref 150–400)
RBC: 4.68 MIL/uL (ref 3.87–5.11)
RDW: 13.6 % (ref 11.5–15.5)
WBC: 9.2 10*3/uL (ref 4.0–10.5)

## 2018-06-01 LAB — HIV ANTIBODY (ROUTINE TESTING W REFLEX): HIV Screen 4th Generation wRfx: NONREACTIVE

## 2018-06-01 LAB — SURGICAL PCR SCREEN
MRSA, PCR: NEGATIVE
Staphylococcus aureus: NEGATIVE

## 2018-06-01 SURGERY — IRRIGATION AND DEBRIDEMENT EXTREMITY
Anesthesia: General | Site: Leg Lower | Laterality: Left

## 2018-06-01 MED ORDER — DIPHENHYDRAMINE HCL 50 MG/ML IJ SOLN
INTRAMUSCULAR | Status: DC | PRN
  Administered 2018-06-01: 12.5 mg via INTRAVENOUS

## 2018-06-01 MED ORDER — PROPOFOL 10 MG/ML IV BOLUS
INTRAVENOUS | Status: AC
  Filled 2018-06-01: qty 20

## 2018-06-01 MED ORDER — LIDOCAINE HCL (CARDIAC) PF 100 MG/5ML IV SOSY
PREFILLED_SYRINGE | INTRAVENOUS | Status: DC | PRN
  Administered 2018-06-01: 100 mg via INTRAVENOUS

## 2018-06-01 MED ORDER — FENTANYL CITRATE (PF) 100 MCG/2ML IJ SOLN
INTRAMUSCULAR | Status: DC | PRN
  Administered 2018-06-01 (×2): 50 ug via INTRAVENOUS

## 2018-06-01 MED ORDER — GUAIFENESIN ER 600 MG PO TB12
600.0000 mg | ORAL_TABLET | Freq: Two times a day (BID) | ORAL | Status: DC | PRN
  Administered 2018-06-01 – 2018-06-02 (×2): 600 mg via ORAL
  Filled 2018-06-01 (×2): qty 1

## 2018-06-01 MED ORDER — FENTANYL CITRATE (PF) 250 MCG/5ML IJ SOLN
INTRAMUSCULAR | Status: AC
  Filled 2018-06-01: qty 5

## 2018-06-01 MED ORDER — FENTANYL CITRATE (PF) 100 MCG/2ML IJ SOLN: 25.0000 ug | INTRAMUSCULAR | Status: DC | PRN

## 2018-06-01 MED ORDER — MIDAZOLAM HCL 5 MG/5ML IJ SOLN
INTRAMUSCULAR | Status: DC | PRN
  Administered 2018-06-01 (×2): 1 mg via INTRAVENOUS

## 2018-06-01 MED ORDER — SUCCINYLCHOLINE CHLORIDE 200 MG/10ML IV SOSY
PREFILLED_SYRINGE | INTRAVENOUS | Status: DC | PRN
  Administered 2018-06-01: 140 mg via INTRAVENOUS

## 2018-06-01 MED ORDER — MENTHOL 3 MG MT LOZG
1.0000 | LOZENGE | OROMUCOSAL | Status: DC | PRN
  Filled 2018-06-01: qty 9

## 2018-06-01 MED ORDER — BUPIVACAINE HCL (PF) 0.25 % IJ SOLN
INTRAMUSCULAR | Status: AC
  Filled 2018-06-01: qty 30

## 2018-06-01 MED ORDER — DEXAMETHASONE SODIUM PHOSPHATE 10 MG/ML IJ SOLN
INTRAMUSCULAR | Status: DC | PRN
  Administered 2018-06-01: 4 mg via INTRAVENOUS

## 2018-06-01 MED ORDER — OXYCODONE HCL 5 MG PO TABS: 5.0000 mg | ORAL_TABLET | ORAL | Status: DC | PRN

## 2018-06-01 MED ORDER — POTASSIUM CHLORIDE CRYS ER 20 MEQ PO TBCR
40.0000 meq | EXTENDED_RELEASE_TABLET | Freq: Once | ORAL | Status: AC
  Administered 2018-06-01: 40 meq via ORAL
  Filled 2018-06-01: qty 2

## 2018-06-01 MED ORDER — HYDROMORPHONE HCL 1 MG/ML IJ SOLN: 0.5000 mg | INTRAMUSCULAR | Status: DC | PRN

## 2018-06-01 MED ORDER — ONDANSETRON HCL 4 MG/2ML IJ SOLN
INTRAMUSCULAR | Status: DC | PRN
  Administered 2018-06-01: 4 mg via INTRAVENOUS

## 2018-06-01 MED ORDER — CEFAZOLIN SODIUM-DEXTROSE 2-3 GM-%(50ML) IV SOLR
INTRAVENOUS | Status: DC | PRN
  Administered 2018-06-01: 2 g via INTRAVENOUS

## 2018-06-01 MED ORDER — 0.9 % SODIUM CHLORIDE (POUR BTL) OPTIME
TOPICAL | Status: DC | PRN
  Administered 2018-06-01: 1000 mL

## 2018-06-01 MED ORDER — PROPOFOL 10 MG/ML IV BOLUS
INTRAVENOUS | Status: DC | PRN
  Administered 2018-06-01: 170 mg via INTRAVENOUS

## 2018-06-01 MED ORDER — LACTATED RINGERS IV SOLN
INTRAVENOUS | Status: DC | PRN
  Administered 2018-06-01: 09:00:00 via INTRAVENOUS

## 2018-06-01 MED ORDER — ONDANSETRON HCL 4 MG/2ML IJ SOLN: 4.0000 mg | Freq: Once | INTRAMUSCULAR | Status: DC | PRN

## 2018-06-01 MED ORDER — GLYCOPYRROLATE 0.2 MG/ML IJ SOLN
INTRAMUSCULAR | Status: DC | PRN
  Administered 2018-06-01: 0.2 mg via INTRAVENOUS

## 2018-06-01 MED ORDER — BUPIVACAINE HCL (PF) 0.25 % IJ SOLN
INTRAMUSCULAR | Status: DC | PRN
  Administered 2018-06-01: 10 mL

## 2018-06-01 MED ORDER — MIDAZOLAM HCL 2 MG/2ML IJ SOLN
INTRAMUSCULAR | Status: AC
  Filled 2018-06-01: qty 2

## 2018-06-01 MED ORDER — OXYCODONE HCL 5 MG/5ML PO SOLN: 5.0000 mg | Freq: Once | ORAL | Status: DC | PRN

## 2018-06-01 MED ORDER — OXYCODONE HCL 5 MG PO TABS: 5.0000 mg | ORAL_TABLET | Freq: Once | ORAL | Status: DC | PRN

## 2018-06-01 SURGICAL SUPPLY — 56 items
BANDAGE ACE 3X5.8 VEL STRL LF (GAUZE/BANDAGES/DRESSINGS) IMPLANT
BANDAGE ACE 6X5 VEL STRL LF (GAUZE/BANDAGES/DRESSINGS) ×3 IMPLANT
BLADE SURG 10 STRL SS (BLADE) ×3 IMPLANT
BNDG COHESIVE 4X5 TAN STRL (GAUZE/BANDAGES/DRESSINGS) ×3 IMPLANT
BNDG COHESIVE 6X5 TAN STRL LF (GAUZE/BANDAGES/DRESSINGS) IMPLANT
BNDG GAUZE ELAST 4 BULKY (GAUZE/BANDAGES/DRESSINGS) ×3 IMPLANT
COVER SURGICAL LIGHT HANDLE (MISCELLANEOUS) ×3 IMPLANT
CUFF TOURNIQUET SINGLE 18IN (TOURNIQUET CUFF) IMPLANT
CUFF TOURNIQUET SINGLE 34IN LL (TOURNIQUET CUFF) IMPLANT
DRAPE ORTHO SPLIT 77X108 STRL (DRAPES)
DRAPE SURG 17X23 STRL (DRAPES) IMPLANT
DRAPE SURG ORHT 6 SPLT 77X108 (DRAPES) IMPLANT
DRAPE U-SHAPE 47X51 STRL (DRAPES) ×3 IMPLANT
DURAPREP 26ML APPLICATOR (WOUND CARE) IMPLANT
ELECT REM PT RETURN 9FT ADLT (ELECTROSURGICAL)
ELECTRODE REM PT RTRN 9FT ADLT (ELECTROSURGICAL) IMPLANT
GAUZE SPONGE 4X4 12PLY STRL (GAUZE/BANDAGES/DRESSINGS) IMPLANT
GAUZE SPONGE 4X4 12PLY STRL LF (GAUZE/BANDAGES/DRESSINGS) ×3 IMPLANT
GAUZE XEROFORM 1X8 LF (GAUZE/BANDAGES/DRESSINGS) IMPLANT
GAUZE XEROFORM 5X9 LF (GAUZE/BANDAGES/DRESSINGS) ×3 IMPLANT
GLOVE BIO SURGEON STRL SZ8 (GLOVE) ×3 IMPLANT
GLOVE BIOGEL PI IND STRL 6.5 (GLOVE) ×1 IMPLANT
GLOVE BIOGEL PI IND STRL 8 (GLOVE) ×2 IMPLANT
GLOVE BIOGEL PI INDICATOR 6.5 (GLOVE) ×2
GLOVE BIOGEL PI INDICATOR 8 (GLOVE) ×4
GLOVE ORTHO TXT STRL SZ7.5 (GLOVE) ×3 IMPLANT
GLOVE SKINSENSE NS SZ6.5 (GLOVE) ×2
GLOVE SKINSENSE STRL SZ6.5 (GLOVE) ×1 IMPLANT
GOWN STRL REUS W/ TWL LRG LVL3 (GOWN DISPOSABLE) ×1 IMPLANT
GOWN STRL REUS W/ TWL XL LVL3 (GOWN DISPOSABLE) ×2 IMPLANT
GOWN STRL REUS W/TWL LRG LVL3 (GOWN DISPOSABLE) ×2
GOWN STRL REUS W/TWL XL LVL3 (GOWN DISPOSABLE) ×4
HANDPIECE INTERPULSE COAX TIP (DISPOSABLE)
KIT BASIN OR (CUSTOM PROCEDURE TRAY) ×3 IMPLANT
KIT TURNOVER KIT B (KITS) ×3 IMPLANT
MANIFOLD NEPTUNE II (INSTRUMENTS) IMPLANT
NEEDLE HYPO 25GX1X1/2 BEV (NEEDLE) ×3 IMPLANT
NS IRRIG 1000ML POUR BTL (IV SOLUTION) ×3 IMPLANT
PACK ORTHO EXTREMITY (CUSTOM PROCEDURE TRAY) ×3 IMPLANT
PAD ARMBOARD 7.5X6 YLW CONV (MISCELLANEOUS) ×6 IMPLANT
PADDING CAST ABS 4INX4YD NS (CAST SUPPLIES)
PADDING CAST ABS COTTON 4X4 ST (CAST SUPPLIES) IMPLANT
PADDING CAST COTTON 6X4 STRL (CAST SUPPLIES) IMPLANT
SET HNDPC FAN SPRY TIP SCT (DISPOSABLE) IMPLANT
SPONGE LAP 18X18 X RAY DECT (DISPOSABLE) IMPLANT
STOCKINETTE IMPERVIOUS 9X36 MD (GAUZE/BANDAGES/DRESSINGS) ×3 IMPLANT
SUT ETHILON 2 0 FS 18 (SUTURE) ×6 IMPLANT
SUT ETHILON 3 0 PS 1 (SUTURE) IMPLANT
SWAB CULTURE ESWAB REG 1ML (MISCELLANEOUS) IMPLANT
SYR CONTROL 10ML LL (SYRINGE) ×3 IMPLANT
TOWEL OR 17X24 6PK STRL BLUE (TOWEL DISPOSABLE) IMPLANT
TOWEL OR 17X26 10 PK STRL BLUE (TOWEL DISPOSABLE) ×3 IMPLANT
TUBE CONNECTING 12'X1/4 (SUCTIONS) ×1
TUBE CONNECTING 12X1/4 (SUCTIONS) ×2 IMPLANT
UNDERPAD 30X30 (UNDERPADS AND DIAPERS) ×3 IMPLANT
YANKAUER SUCT BULB TIP NO VENT (SUCTIONS) ×3 IMPLANT

## 2018-06-01 NOTE — Brief Op Note (Signed)
05/31/2018 - 06/01/2018  9:42 AM  PATIENT:  Alison [name]  57 y.o. female  PRE-OPERATIVE DIAGNOSIS:  infection left calf  POST-OPERATIVE DIAGNOSIS:  infected left calf  PROCEDURE:  Procedure(s): IRRIGATION AND DEBRIDEMENT LEFT CALF (Left)  SURGEON:  Surgeon(s) and Role:    Mcarthur Rossetti, MD - Primary  PHYSICIAN ASSISTANT: Benita Stabile, PA-C  ANESTHESIA:   local and general  EBL: minimal  LOCAL MEDICATIONS USED:  MARCAINE     COUNTS:  YES  TOURNIQUET:  None  DICTATION: .Other Dictation: Dictation Number [id number]  PLAN OF CARE: Admit to inpatient   PATIENT DISPOSITION:  PACU - hemodynamically stable.   Delay start of Pharmacological VTE agent (>24hrs) due to surgical blood loss or risk of bleeding: no

## 2018-06-01 NOTE — Anesthesia Preprocedure Evaluation (Signed)
Anesthesia Evaluation  Patient identified by MRN, date of birth, ID band Patient awake    Reviewed: Allergy & Precautions, NPO status , Patient's Chart, lab work & pertinent test results  Airway Mallampati: II  TM Distance: >3 FB Neck ROM: Full    Dental  (+) Teeth Intact, Dental Advisory Given   Pulmonary    breath sounds clear to auscultation       Cardiovascular hypertension,  Rhythm:Regular Rate:Normal     Neuro/Psych    GI/Hepatic   Endo/Other    Renal/GU      Musculoskeletal   Abdominal (+) + obese,   Peds  Hematology   Anesthesia Other Findings   Reproductive/Obstetrics                             Anesthesia Physical Anesthesia Plan  ASA: III  Anesthesia Plan: General   Post-op Pain Management:    Induction: Intravenous  PONV Risk Score and Plan: Ondansetron and Dexamethasone  Airway Management Planned: Oral ETT  Additional Equipment:   Intra-op Plan:   Post-operative Plan: Extubation in OR  Informed Consent: I have reviewed the patients History and Physical, chart, labs and discussed the procedure including the risks, benefits and alternatives for the proposed anesthesia with the patient or authorized representative who has indicated his/her understanding and acceptance.     Dental advisory given  Plan Discussed with: CRNA and Anesthesiologist  Anesthesia Plan Comments:         Anesthesia Quick Evaluation  

## 2018-06-01 NOTE — Transfer of Care (Signed)
Immediate Anesthesia Transfer of Care Note  Patient: Alison [name]  Procedure(s) Performed: IRRIGATION AND DEBRIDEMENT LEFT CALF (Left Leg Lower)  Patient Location: PACU  Anesthesia Type:General  Level of Consciousness: awake, alert , oriented and patient cooperative  Airway & Oxygen Therapy: Patient Spontanous Breathing and Patient connected to face mask oxygen  Post-op Assessment: Report given to RN and Post -op Vital signs reviewed and stable  Post vital signs: Reviewed and stable  Last Vitals:  Vitals Value Taken Time  BP 131/64 06/01/2018 10:09 AM  Temp 36.5 C 06/01/2018 10:09 AM  Pulse 78 06/01/2018 10:10 AM  Resp 18 06/01/2018 10:10 AM  SpO2 100 % 06/01/2018 10:10 AM  Vitals shown include unvalidated device data.  Last Pain:  Vitals:   06/01/18 1009  TempSrc:   PainSc: 0-No pain         Complications: No apparent anesthesia complications

## 2018-06-01 NOTE — Op Note (Signed)
NAME: Alison [name], LOS MEDICAL RECORD [id number] ACCOUNT [id number] DATE OF BIRTH:[birth year] FACILITY: [facility] LOCATION: [facility] PHYSICIAN:Sandeep Radell [place], MD  OPERATIVE REPORT  DATE OF PROCEDURE:  06/01/2018  PREOPERATIVE DIAGNOSIS:  Left calf, cat bite wound with cellulitis and abscess.  POSTOPERATIVE DIAGNOSIS:  Left calf, cat bite wound with cellulitis and abscess.  PROCEDURE:  Irrigation and debridement of left calf cat bite wound and abscess with sharp excisional debridement over 5 cm of necrotic skin, fascia and necrotic adipose tissue.  SURGEON:  Lind Guest. Ninfa Linden, MD  ASSISTANT:  Erskine Emery, PA-C  ANESTHESIA:   1.  General. 2.  Local with plain Marcaine.  ESTIMATED BLOOD LOSS:  Minimal.  COMPLICATIONS:  None.  INDICATIONS:  The patient is a 57 year old female who sustained a cat bite from a neighborhood cat to her left calf over 9 days ago.  She was started on oral clindamycin elsewhere.  She just presented to the emergency room yesterday with continued  redness and induration of the skin around the cat bite itself.  I could express gross purulence from this area.  She was admitted to the medicine service for IV antibiotics and presents to the operating room today for irrigation and debridement of the  area.  She fully understands the reasoning behind proceeding with surgery and the risks and benefits.  Discussion has been had with her.  DESCRIPTION OF PROCEDURE:  After informed consent was obtained,  appropriate left calf was marked.  She was brought to the operating room and placed supine on the operating table.  General anesthesia was then obtained.  We prepped the left leg from the  thigh down to the ankle with a Betadine scrub and paint including a sterile stockinette.  Time-out was called and she was identified as the correct patient, correct left calf wound.    We then used a #10 blade to make an incision between the 2 back cat bites about 5 cm.  We  were able to sharply excise necrotic skin, fascia and adipose tissue in this area.  We used a scalpel and a rongeur for this.  We then irrigated with a liter of  normal saline solution through the soft tissue.  We then were able to loosely appropriate reapproximate the skin with interrupted 2-0 nylon suture.  Xeroform well-padded sterile dressing was applied.  She was awakened, extubated, and taken to recovery in  stable condition.  All final counts were correct.  There were no complications noted.  AN/NUANCE  D:06/01/2018 T:06/01/2018 JOB:[id number]

## 2018-06-01 NOTE — Progress Notes (Signed)
Patient ID: Alison [name], female   DOB: 08/24/1961, 57 y.o.   MRN: [id number] The patient understands fully that we are proceeding to surgery this morning for an irrigation/debridement to her left calf due to an area of infection from a cat bite.  Risks and benefits of surgery were discussed and informed consent obtained.

## 2018-06-01 NOTE — Progress Notes (Signed)
PROGRESS NOTE    Alison [name]  ONG:[id number] DOB: Feb 01, 1961 DOA: 05/31/2018 PCP: [facility]    Brief Narrative: Alison [name] is a 57 y.o. female with medical history significant for tension, dyslipidemia, low-grade anemia and hypothyroidism.  Patient initially presented to the ER on 6/13 after sustaining a cat bite to her left posterior calf region. This admission, she comes in with worsening left calf  Pain and worsening abscess with purulent material.     Assessment & Plan:   Principal Problem:   Infected cat bite of lower leg, left, sequela Active Problems:   Hyperlipidemia   Hypertension   Anemia   Thyroid disease   Left lower calf infection with purulent drainage: S/p I&D today by Dr Ninfa Linden.  Resume IV antibiotics with IV rocephin and IV flagyl.  Pain control.   Hyperlipidemia: Resume crestor.   Hypertension:  Well controlled.    Hypothyroidism: Resume synthroid.   Hypokalemia Potassium repleted.   DVT prophylaxis: lovenox.  Code Status:  Full code Family Communication: none at bedside.  Disposition Plan: discharge in 1 to2 days.    Consultants:   Orthopedics.    Procedures: I&D today.    Antimicrobials: rocephin and flagyl since admission.    Subjective: Pain controlled.   Objective: Vitals:   06/01/18 1009 06/01/18 1024 06/01/18 1035 06/01/18 1050  BP: 131/64 118/75 121/75 122/74  Pulse: 84 74 68   Resp: 18 20 (!) 21 20  Temp: 97.7 F (36.5 C)  97.7 F (36.5 C) 98.2 F (36.8 C)  TempSrc:    Oral  SpO2: 100% 97% 95% 95%  Weight:      Height:        Intake/Output Summary (Last 24 hours) at 06/01/2018 1218 Last data filed at 06/01/2018 1003 Gross per 24 hour  Intake 2736.41 ml  Output 15 ml  Net 2721.41 ml   Filed Weights   05/31/18 1642 06/01/18 0846  Weight: 101.6 kg (224 lb) 101.6 kg (224 lb)    Examination:  General exam: Appears calm and comfortable  Respiratory system: Clear to  auscultation. Respiratory effort normal. Cardiovascular system: S1 & S2 heard, RRR. No JVD, murmurs, rubs, gallops or clicks. No pedal edema. Gastrointestinal system: Abdomen is nondistended, soft and nontender. No organomegaly or masses felt. Normal bowel sounds heard. Central nervous system: Alert and oriented. No focal neurological deficits. Extremities: left calf bandaged.  Skin: No rashes, lesions or ulcers Psychiatry: Judgement and insight appear normal. Mood & affect appropriate.     Data Reviewed: I have personally reviewed following labs and imaging studies  CBC: Recent Labs  Lab 05/31/18 1113 06/01/18 0722  WBC 8.4 9.2  NEUTROABS 5.1  --   HGB 14.7 14.1  HCT 43.4 43.2  MCV 90.4 92.3  PLT 225 440   Basic Metabolic Panel: Recent Labs  Lab 05/31/18 1113 06/01/18 0722  NA 139 140  K 3.4* 3.4*  CL 103 107  CO2 26 25  GLUCOSE 170* 85  BUN 8 6  CREATININE 0.92 0.73  CALCIUM 9.4 8.7*   GFR: Estimated Creatinine Clearance: 87.6 mL/min (by C-G formula based on SCr of 0.73 mg/dL). Liver Function Tests: Recent Labs  Lab 05/31/18 1113  AST 24  ALT 18  ALKPHOS 78  BILITOT 0.8  PROT 6.3*  ALBUMIN 3.4*   No results for input(s): LIPASE, AMYLASE in the last 168 hours. No results for input(s): AMMONIA in the last 168 hours. Coagulation Profile: No results for input(s):  INR, PROTIME in the last 168 hours. Cardiac Enzymes: No results for input(s): CKTOTAL, CKMB, CKMBINDEX, TROPONINI in the last 168 hours. BNP (last 3 results) No results for input(s): PROBNP in the last 8760 hours. HbA1C: Recent Labs    05/31/18 1611  HGBA1C 5.7*   CBG: No results for input(s): GLUCAP in the last 168 hours. Lipid Profile: No results for input(s): CHOL, HDL, LDLCALC, TRIG, CHOLHDL, LDLDIRECT in the last 72 hours. Thyroid Function Tests: No results for input(s): TSH, T4TOTAL, FREET4, T3FREE, THYROIDAB in the last 72 hours. Anemia Panel: No results for input(s): VITAMINB12,  FOLATE, FERRITIN, TIBC, IRON, RETICCTPCT in the last 72 hours. Sepsis Labs: Recent Labs  Lab 05/31/18 1126 05/31/18 1256  LATICACIDVEN 2.59* 0.99    Recent Results (from the past 240 hour(s))  Surgical PCR screen     Status: None   Collection Time: 05/31/18 10:33 PM  Result Value Ref Range Status   MRSA, PCR NEGATIVE NEGATIVE Final   Staphylococcus aureus NEGATIVE NEGATIVE Final    Comment: (NOTE) The Xpert SA Assay (FDA approved for NASAL specimens in patients 48 years of age and older), is one component of a comprehensive surveillance program. It is not intended to diagnose infection nor to guide or monitor treatment. Performed at [facility], [place] [address], [place], [place] [address]          Radiology Studies: Ct Tibia Fibula Left W Contrast  Result Date: 05/31/2018 CLINICAL DATA:  Cat bite to the left calf on 05/22/2018. Pain, swelling and wound drainage. EXAM: CT OF THE LOWER RIGHT EXTREMITY WITH CONTRAST TECHNIQUE: Multidetector CT imaging of the lower right extremity was performed according to the standard protocol following intravenous contrast administration. COMPARISON:  None. CONTRAST:  194mL OMNIPAQUE IOHEXOL 300 MG/ML  SOLN FINDINGS: Patchy areas of subcutaneous soft tissue swelling/fluid mainly aspect. This is likely cellulitis. More focal area skin thickening and subcutaneous edema involving the upper left calf may be the area of the injury. No focal rim enhancing fluid collection to suggest a drainable abscess. No radiodense foreign body is identified. No findings for myofasciitis or pyomyositis. No definite findings for septic arthritis or osteomyelitis. There is a small right knee joint effusion and moderate knee joint degenerative changes. The ankle joint appears intact. The major vascular structures appear normal. IMPRESSION: 1. Cellulitis but no discrete drainable soft tissue abscess. 2. No radiopaque foreign body. 3. No CT findings to suggest  pyomyositis, septic arthritis or osteomyelitis. Electronically Signed   By: Marijo Sanes M.D.   On: 05/31/2018 14:39        Scheduled Meds: . amLODipine  5 mg Oral Daily  . enoxaparin (LOVENOX) injection  40 mg Subcutaneous Q24H  . escitalopram  10 mg Oral Daily  . gabapentin  100 mg Oral Daily  . levothyroxine  112 mcg Oral QAC breakfast  . multivitamin with minerals  1 tablet Oral Daily  . rosuvastatin  40 mg Oral Daily  . sodium chloride flush  3 mL Intravenous Q12H   Continuous Infusions: . sodium chloride 75 mL/hr at 06/01/18 0819  . cefTRIAXone (ROCEPHIN)  IV    . metronidazole 500 mg (06/01/18 0353)     LOS: 0 days    Time spent: 32 min    Hosie Poisson, MD [facility] Pager [phone]  If 7PM-7AM, please contact night-coverage [email or web address] Password [facility] 06/01/2018, 12:18 PM

## 2018-06-01 NOTE — Anesthesia Procedure Notes (Signed)
Procedure Name: Intubation Date/Time: 06/01/2018 9:19 AM Performed by: Adalberto Ill, CRNA Pre-anesthesia Checklist: Patient identified, Emergency Drugs available, Suction available, Patient being monitored and Timeout performed Patient Re-evaluated:Patient Re-evaluated prior to induction Oxygen Delivery Method: Circle system utilized Preoxygenation: Pre-oxygenation with 100% oxygen Induction Type: IV induction Ventilation: Mask ventilation without difficulty Laryngoscope Size: Miller and 2 Grade View: Grade II Tube type: Oral Tube size: 7.5 mm Number of attempts: 1 Airway Equipment and Method: Stylet Placement Confirmation: ETT inserted through vocal cords under direct vision,  positive ETCO2 and breath sounds checked- equal and bilateral Secured at: 22 cm Tube secured with: Tape Dental Injury: Teeth and Oropharynx as per pre-operative assessment

## 2018-06-01 NOTE — Anesthesia Postprocedure Evaluation (Signed)
Anesthesia Post Note  Patient: Alison [name]  Procedure(s) Performed: IRRIGATION AND DEBRIDEMENT LEFT CALF (Left Leg Lower)     Patient location during evaluation: PACU Anesthesia Type: General Level of consciousness: awake and alert Pain management: pain level controlled Vital Signs Assessment: post-procedure vital signs reviewed and stable Respiratory status: spontaneous breathing, nonlabored ventilation, respiratory function stable and patient connected to nasal cannula oxygen Cardiovascular status: blood pressure returned to baseline and stable Postop Assessment: no apparent nausea or vomiting Anesthetic complications: no    Last Vitals:  Vitals:   06/01/18 1434 06/01/18 2119  BP: 128/76 (!) 154/84  Pulse: 80 85  Resp: 18   Temp: 37 C 37.1 C  SpO2: 99% 99%    Last Pain:  Vitals:   06/01/18 2119  TempSrc: Oral  PainSc:                  Aneita Kiger COKER

## 2018-06-01 NOTE — Progress Notes (Deleted)
CRITICAL VALUE STICKER  CRITICAL VALUE: Aerobic blood culture bottle growing staph, source tissue  RECEIVER (on-site recipient of call): Julieanne Cotton, RN   [place] NOTIFIED: 06/01/2018 1116  MESSENGER (representative from lab): Camelia in microbiology lab  MD NOTIFIED: [place]  TIME OF NOTIFICATION: 1117  RESPONSE: Awaiting response

## 2018-06-02 ENCOUNTER — Encounter (HOSPITAL_COMMUNITY): Payer: Self-pay | Admitting: Orthopaedic Surgery

## 2018-06-02 MED ORDER — CEFUROXIME AXETIL 500 MG PO TABS: 500.0000 mg | ORAL_TABLET | Freq: Two times a day (BID) | ORAL | 0 refills | Status: AC

## 2018-06-02 MED ORDER — METRONIDAZOLE 500 MG PO TABS: 500.0000 mg | ORAL_TABLET | Freq: Three times a day (TID) | ORAL | Status: DC

## 2018-06-02 MED ORDER — METRONIDAZOLE 500 MG PO TABS: 500.0000 mg | ORAL_TABLET | Freq: Three times a day (TID) | ORAL | 0 refills | Status: DC

## 2018-06-02 NOTE — Discharge Summary (Signed)
Physician Discharge Summary  Alison [name] ERX:[id number] DOB: 06-13-1961 DOA: 05/31/2018  PCP: [facility]  Admit date: 05/31/2018 Discharge date: 06/02/2018  Admitted From: Home.  Disposition: Home  Recommendations for Outpatient Follow-up:  1. Follow up with PCP in 1-2 weeks 2. Please obtain BMP/CBC in one week   Discharge Condition: stable.  CODE STATUS: Diet recommendation: Heart Healthy / Carb Modified / Regular / Dysphagia   Brief/Interim Summary: Alison Hodgkins Wattsis a 57 y.o.femalewith medical history significant fortension, dyslipidemia, low-grade anemia and hypothyroidism. Patient initially presented to the ER on 6/13 after sustaining a cat bite to her left posterior calf region. This admission, she comes in with worsening left calf  Pain and worsening abscess with purulent material.     Discharge Diagnoses:  Principal Problem:   Infected cat bite of lower leg, left, sequela Active Problems:   Hyperlipidemia   Hypertension   Anemia   Thyroid disease  Left lower calf infection with purulent drainage: S/p I&D  by Dr Ninfa Linden.  She was started on  IV antibiotics with IV rocephin and IV flagyl. She was able to walk without any issues. Pain controlled. She was discharged on keflex and flagyl.    Hyperlipidemia: Resume crestor.   Hypertension:  Well controlled.    Hypothyroidism: Resume synthroid.   Hypokalemia Potassium repleted    Discharge Instructions  Discharge Instructions    Diet - low sodium heart healthy   Complete by:  As directed    Discharge instructions   Complete by:  As directed    Follow up with Dr Ninfa Linden as recommended.     Allergies as of 06/02/2018      Reactions   Aspirin Anaphylaxis   REACTION: Anaphlactic reaction.   Penicillins Itching      Medication List    STOP taking these medications   clindamycin 300 MG capsule Commonly known as:  CLEOCIN     TAKE these medications    albuterol 108 (90 Base) MCG/ACT inhaler Commonly known as:  PROVENTIL HFA;VENTOLIN HFA Inhale 2 puffs into the lungs every 6 (six) hours as needed. For asthma   ALPRAZolam 0.25 MG tablet Commonly known as:  XANAX Take 0.25 mg by mouth 2 (two) times daily as needed for anxiety.   amLODipine 5 MG tablet Commonly known as:  NORVASC Take 5 mg by mouth every morning.   cefUROXime 500 MG tablet Commonly known as:  CEFTIN Take 1 tablet (500 mg total) by mouth 2 (two) times daily for 7 days.   escitalopram 10 MG tablet Commonly known as:  LEXAPRO Take 10 mg by mouth daily.   gabapentin 100 MG capsule Commonly known as:  NEURONTIN Take 100 mg by mouth daily.   hydrochlorothiazide 25 MG tablet Commonly known as:  HYDRODIURIL Take 25 mg by mouth every other day.   levothyroxine 112 MCG tablet Commonly known as:  SYNTHROID, LEVOTHROID Take 112 mcg by mouth every morning.   metroNIDAZOLE 500 MG tablet Commonly known as:  FLAGYL Take 1 tablet (500 mg total) by mouth every 8 (eight) hours.   multivitamin with minerals tablet Take 1 tablet by mouth daily.   rosuvastatin 40 MG tablet Commonly known as:  CRESTOR Take 40 mg by mouth daily.      Follow-up Information    Mcarthur Rossetti, MD. Schedule an appointment as soon as possible for a visit in 2 week(s).   Specialty:  Orthopedic Surgery Contact information: [place] [address]  [phone]        [facility]. Schedule an appointment as soon as possible for a visit in 1 week(s).   Specialty:  Family Medicine Contact information: [address] [place] [address] [phone]          Allergies  Allergen Reactions  . Aspirin Anaphylaxis    REACTION: Anaphlactic reaction.  [name] Penicillins Itching    Consultations:  Orthopedics. Dr Ninfa Linden.    Procedures/Studies: Ct Tibia Fibula Left W Contrast  Result Date:  05/31/2018 CLINICAL DATA:  Cat bite to the left calf on 05/22/2018. Pain, swelling and wound drainage. EXAM: CT OF THE LOWER RIGHT EXTREMITY WITH CONTRAST TECHNIQUE: Multidetector CT imaging of the lower right extremity was performed according to the standard protocol following intravenous contrast administration. COMPARISON:  None. CONTRAST:  163mL OMNIPAQUE IOHEXOL 300 MG/ML  SOLN FINDINGS: Patchy areas of subcutaneous soft tissue swelling/fluid mainly aspect. This is likely cellulitis. More focal area skin thickening and subcutaneous edema involving the upper left calf may be the area of the injury. No focal rim enhancing fluid collection to suggest a drainable abscess. No radiodense foreign body is identified. No findings for myofasciitis or pyomyositis. No definite findings for septic arthritis or osteomyelitis. There is a small right knee joint effusion and moderate knee joint degenerative changes. The ankle joint appears intact. The major vascular structures appear normal. IMPRESSION: 1. Cellulitis but no discrete drainable soft tissue abscess. 2. No radiopaque foreign body. 3. No CT findings to suggest pyomyositis, septic arthritis or osteomyelitis. Electronically Signed   By: Marijo Sanes M.D.   On: 05/31/2018 14:39       Subjective: No new complaints.   Discharge Exam: Vitals:   06/02/18 0514 06/02/18 0836  BP: 119/71 136/81  Pulse: 65   Resp:    Temp: 98.3 F (36.8 C)   SpO2: 98%    Vitals:   06/01/18 1434 06/01/18 2119 06/02/18 0514 06/02/18 0836  BP: 128/76 (!) 154/84 119/71 136/81  Pulse: 80 85 65   Resp: 18     Temp: 98.6 F (37 C) 98.7 F (37.1 C) 98.3 F (36.8 C)   TempSrc:  Oral Oral   SpO2: 99% 99% 98%   Weight:      Height:        General: Pt is alert, awake, not in acute distress Cardiovascular: RRR, S1/S2 +, no rubs, no gallops Respiratory: CTA bilaterally, no wheezing, no rhonchi Abdominal: Soft, NT, ND, bowel sounds + Extremities: no edema, no  cyanosis    The results of significant diagnostics from this hospitalization (including imaging, microbiology, ancillary and laboratory) are listed below for reference.     Microbiology: Recent Results (from the past 240 hour(s))  Culture, blood (routine x 2)     Status: None (Preliminary result)   Collection Time: 05/31/18 12:49 PM  Result Value Ref Range Status   Specimen Description BLOOD RIGHT HAND  Final   Special Requests   Final    BOTTLES DRAWN AEROBIC AND ANAEROBIC Blood Culture adequate volume   Culture   Final    NO GROWTH 2 DAYS Performed at [facility], [address] [place],  [address]    Report Status PENDING  Incomplete  Culture, blood (routine x 2)     Status: None (Preliminary result)   Collection Time: 05/31/18 12:55 PM  Result Value Ref Range Status   Specimen Description BLOOD LEFT WRIST  Final   Special Requests  Final    BOTTLES DRAWN AEROBIC AND ANAEROBIC Blood Culture adequate volume   Culture   Final    NO GROWTH 2 DAYS Performed at [facility], [place] [address] [place], [place] [address]    Report Status PENDING  Incomplete  Surgical PCR screen     Status: None   Collection Time: 05/31/18 10:33 PM  Result Value Ref Range Status   MRSA, PCR NEGATIVE NEGATIVE Final   Staphylococcus aureus NEGATIVE NEGATIVE Final    Comment: (NOTE) The Xpert SA Assay (FDA approved for NASAL specimens in patients 73 years of age and older), is one component of a comprehensive surveillance program. It is not intended to diagnose infection nor to guide or monitor treatment. Performed at [facility], [place] [address] [place], [place] [address]      Labs: BNP (last 3 results) No results for input(s): BNP in the last 8760 hours. Basic Metabolic Panel: Recent Labs  Lab 05/31/18 1113 06/01/18 0722  NA 139 140  K 3.4* 3.4*  CL 103 107  CO2 26 25  GLUCOSE 170* 85  BUN 8 6  CREATININE 0.92 0.73  CALCIUM 9.4 8.7*   Liver Function  Tests: Recent Labs  Lab 05/31/18 1113  AST 24  ALT 18  ALKPHOS 78  BILITOT 0.8  PROT 6.3*  ALBUMIN 3.4*   No results for input(s): LIPASE, AMYLASE in the last 168 hours. No results for input(s): AMMONIA in the last 168 hours. CBC: Recent Labs  Lab 05/31/18 1113 06/01/18 0722  WBC 8.4 9.2  NEUTROABS 5.1  --   HGB 14.7 14.1  HCT 43.4 43.2  MCV 90.4 92.3  PLT 225 207   Cardiac Enzymes: No results for input(s): CKTOTAL, CKMB, CKMBINDEX, TROPONINI in the last 168 hours. BNP: Invalid input(s): POCBNP CBG: No results for input(s): GLUCAP in the last 168 hours. D-Dimer No results for input(s): DDIMER in the last 72 hours. Hgb A1c Recent Labs    05/31/18 1611  HGBA1C 5.7*   Lipid Profile No results for input(s): CHOL, HDL, LDLCALC, TRIG, CHOLHDL, LDLDIRECT in the last 72 hours. Thyroid function studies No results for input(s): TSH, T4TOTAL, T3FREE, THYROIDAB in the last 72 hours.  Invalid input(s): FREET3 Anemia work up No results for input(s): VITAMINB12, FOLATE, FERRITIN, TIBC, IRON, RETICCTPCT in the last 72 hours. Urinalysis No results found for: COLORURINE, APPEARANCEUR, [place], [place], [place], [place], [place], [place], PROTEINUR, UROBILINOGEN, NITRITE, LEUKOCYTESUR Sepsis Labs Invalid input(s): PROCALCITONIN,  WBC,  LACTICIDVEN Microbiology Recent Results (from the past 240 hour(s))  Culture, blood (routine x 2)     Status: None (Preliminary result)   Collection Time: 05/31/18 12:49 PM  Result Value Ref Range Status   Specimen Description BLOOD RIGHT HAND  Final   Special Requests   Final    BOTTLES DRAWN AEROBIC AND ANAEROBIC Blood Culture adequate volume   Culture   Final    NO GROWTH 2 DAYS Performed at [facility], [address] [place], [place] [address]    Report Status PENDING  Incomplete  Culture, blood (routine x 2)     Status: None (Preliminary result)   Collection Time: 05/31/18 12:55 PM  Result Value Ref Range Status    Specimen Description BLOOD LEFT WRIST  Final   Special Requests   Final    BOTTLES DRAWN AEROBIC AND ANAEROBIC Blood Culture adequate volume   Culture   Final    NO GROWTH 2 DAYS Performed at [facility], [address] [place], [place] [address]    Report Status PENDING  Incomplete  Surgical PCR screen     Status: None   Collection Time: 05/31/18 10:33 PM  Result Value Ref Range Status   MRSA, PCR NEGATIVE NEGATIVE Final   Staphylococcus aureus NEGATIVE NEGATIVE Final    Comment: (NOTE) The Xpert SA Assay (FDA approved for NASAL specimens in patients 46 years of age and older), is one component of a comprehensive surveillance program. It is not intended to diagnose infection nor to guide or monitor treatment. Performed at [facility], [place] [address] [place],  [address]      Time coordinating discharge: 32 minutes  SIGNED:   Hosie Poisson, MD  [facility] 06/02/2018, 6:07 PM Pager   If 7PM-7AM, please contact night-coverage [email or web address] Password [email or web address]

## 2018-06-02 NOTE — Plan of Care (Signed)
  Problem: Education: Goal: Knowledge of General Education information will improve Outcome: Progressing   Problem: Clinical Measurements: Goal: Will remain free from infection Outcome: Progressing   Problem: Clinical Measurements: Goal: Diagnostic test results will improve Outcome: Progressing

## 2018-06-02 NOTE — Progress Notes (Signed)
Alison [name] to be D/C'd Home per MD order.  Discussed with the patient and all questions fully answered.  VSS, Skin clean, dry and intact without evidence of skin break down, no evidence of skin tears noted. IV catheter discontinued intact. Site without signs and symptoms of complications. Dressing and pressure applied.  An After Visit Summary was printed and given to the patient. Patient received prescription.  D/c education completed with patient/family including follow up instructions, medication list, d/c activities limitations if indicated, with other d/c instructions as indicated by MD - patient able to verbalize understanding, all questions fully answered.   Patient instructed to return to ED, call 911, or call MD for any changes in condition.   Patient escorted via [place], and D/C home via private auto.  Holley Raring 06/02/2018 12:15 PM

## 2018-06-02 NOTE — Discharge Instructions (Signed)
You can get your current left leg dressing wet in the shower daily. You can change that dressing in about 4-5 days and then get your actual incision wet. You can then place another AquaCell dressing provided to you and leave thatt one on for 4-5 more days if needed.

## 2018-06-02 NOTE — Progress Notes (Signed)
Patient ID: Alison [name], female   DOB: 04/04/1961, 57 y.o.   MRN: [id number] Tolerated surgery well yesterday.  Feels good this am.  Much less pain.  Her left leg incision looks good and a new dressing was placed.  Can be discharged on oral antibiotics from ortho standpoint.

## 2018-06-05 LAB — CULTURE, BLOOD (ROUTINE X 2)
Culture: NO GROWTH
Culture: NO GROWTH
Special Requests: ADEQUATE
Special Requests: ADEQUATE

## 2018-06-16 ENCOUNTER — Encounter (INDEPENDENT_AMBULATORY_CARE_PROVIDER_SITE_OTHER): Payer: Self-pay | Admitting: Physician Assistant

## 2018-06-16 ENCOUNTER — Ambulatory Visit (INDEPENDENT_AMBULATORY_CARE_PROVIDER_SITE_OTHER): Payer: 59 | Admitting: Physician Assistant

## 2018-06-16 DIAGNOSIS — W5501XD Bitten by cat, subsequent encounter: Secondary | ICD-10-CM

## 2018-06-16 DIAGNOSIS — S81852D Open bite, left lower leg, subsequent encounter: Secondary | ICD-10-CM

## 2018-06-16 NOTE — Progress Notes (Signed)
HPI: [name] returns today follow-up of a left lower leg cat bite.  She underwent irrigation debridement on 06/01/2018.  She states she has had no fevers chills no nausea vomiting.  She has had some GI upset due to the Ceftin and Flagyl but she has been on and she is finished this as of today.  She is having some numbness lateral aspect of the left lower leg.  Physical exam left lower leg calf supple nontender.  No drainage or expressible drainage from the wound.  Bleeding is well approximated with nylon sutures interrupted.  No signs of dehiscence.  Slight edema no abnormal warmth.  Mild erythema about the wound site.  Full range of motion left ankle without pain.  Inversion eversion against resistance 5 out of 5 strength.  Subjective decreased sensation in the peroneal distribution   Impression: Status post left Calf irrigation and debridement Cat bite  Plan: Sutures removed Steri-Strips applied.  She will wash the wound with a antibacterial soap.  Once the Steri-Strips came off she can start applying some mupirocin.  She stopped her Neosporin she has been applied.  Sample of mupirocin was given.  Questions encouraged and answered we will see her back in 2 weeks sooner if there is any signs of gross infection or significant drainage.  Did ask her to go on some vitamin B6 100 mg twice daily due to the numbness in the peroneal distribution of the left lower leg.

## 2018-06-23 ENCOUNTER — Ambulatory Visit (INDEPENDENT_AMBULATORY_CARE_PROVIDER_SITE_OTHER): Payer: 59 | Admitting: Orthopaedic Surgery

## 2018-06-23 ENCOUNTER — Encounter (INDEPENDENT_AMBULATORY_CARE_PROVIDER_SITE_OTHER): Payer: Self-pay | Admitting: Orthopaedic Surgery

## 2018-06-23 VITALS — Ht 62.0 in | Wt 224.0 lb

## 2018-06-23 DIAGNOSIS — W5501XD Bitten by cat, subsequent encounter: Secondary | ICD-10-CM

## 2018-06-23 DIAGNOSIS — S81852D Open bite, left lower leg, subsequent encounter: Secondary | ICD-10-CM

## 2018-06-23 NOTE — Progress Notes (Signed)
The patient is here today in follow-up after having an irrigation debridement of a left calf cat bite wound.  She is done with antibiotics.  We have been dealing with a chronic wound now.  She is 20 days out from her surgery.  There is a lot of adipose just necrosis around this area and the dimpling.  On exam wound looks like it is healing nicely.  There is still an open area but is not really any worrisome features in terms of infection.  She is can continue keeping this clean daily without soapy water and Then Place, Bactroban ointment on this.  She can occasionally feel it with peroxide as well.  This should heal up with time.  I have recommended at least compressive TED hose are knee highs if she continues to have swelling.  All question concerns were answered and addressed.  She will follow-up as needed since she is non-smoker nondiabetic however she understands if the wound is not improving anyway do not hesitate to come back and see [place].

## 2018-06-24 ENCOUNTER — Other Ambulatory Visit: Payer: Self-pay | Admitting: Family Medicine

## 2018-06-24 ENCOUNTER — Ambulatory Visit (INDEPENDENT_AMBULATORY_CARE_PROVIDER_SITE_OTHER): Payer: 59 | Admitting: Family Medicine

## 2018-06-24 ENCOUNTER — Other Ambulatory Visit: Payer: Self-pay

## 2018-06-24 ENCOUNTER — Encounter: Payer: Self-pay | Admitting: Family Medicine

## 2018-06-24 VITALS — BP 120/82 | HR 79 | Temp 98.2°F | Ht 62.0 in | Wt 228.4 lb

## 2018-06-24 DIAGNOSIS — Z9884 Bariatric surgery status: Secondary | ICD-10-CM

## 2018-06-24 DIAGNOSIS — F5101 Primary insomnia: Secondary | ICD-10-CM | POA: Diagnosis not present

## 2018-06-24 DIAGNOSIS — E782 Mixed hyperlipidemia: Secondary | ICD-10-CM | POA: Diagnosis not present

## 2018-06-24 DIAGNOSIS — J4520 Mild intermittent asthma, uncomplicated: Secondary | ICD-10-CM | POA: Insufficient documentation

## 2018-06-24 DIAGNOSIS — E7849 Other hyperlipidemia: Secondary | ICD-10-CM | POA: Insufficient documentation

## 2018-06-24 DIAGNOSIS — Z1231 Encounter for screening mammogram for malignant neoplasm of breast: Secondary | ICD-10-CM | POA: Diagnosis not present

## 2018-06-24 DIAGNOSIS — F411 Generalized anxiety disorder: Secondary | ICD-10-CM

## 2018-06-24 DIAGNOSIS — I10 Essential (primary) hypertension: Secondary | ICD-10-CM | POA: Diagnosis not present

## 2018-06-24 DIAGNOSIS — Z1239 Encounter for other screening for malignant neoplasm of breast: Secondary | ICD-10-CM

## 2018-06-24 MED ORDER — OMEPRAZOLE 20 MG PO CPDR: 20.0000 mg | DELAYED_RELEASE_CAPSULE | Freq: Every day | ORAL | 2 refills | Status: DC

## 2018-06-24 NOTE — Patient Instructions (Signed)
It was so good seeing you again! Thank you for establishing with my new practice and allowing me to continue caring for you. It means a lot to me.  Please sign a release so I can get the EKG from [facility].   Please schedule a follow up appointment with me in October for your annual complete physical; please come fasting.  Please start taking prilosec once daily and f/u with [facility] surgery.

## 2018-06-24 NOTE — Telephone Encounter (Signed)
Please see change request from pharmacy. Please advise.

## 2018-06-24 NOTE — Progress Notes (Signed)
Subjective  CC:  Chief Complaint  Patient presents with  . Establish Care    Transfer from [place], Last Physical 09/2017, Needs Mammogram   . Neck Pain    seeing an Orthopedists for Pain     HPI: Alison GROSECLOSE is a 57 y.o. female who presents to [place] [facility] today to establish care with me as a new patient.   She has the following concerns or needs:  Pleasant 57 year old female with documented past medical history below presents to reestablish care.  Overall she is doing well  Had last physical October 2018.  Had follow-up blood work in March of this year.  I reviewed all records from care everywhere.  Currently, hypothyroidism and hyperlipidemia and hypertension are all well controlled on current medications.  No adverse effects.  Complains of GERD symptoms.  Seeing [place] [facility] due to gastric banding status and thinking about getting it removed.  Has reflux and heartburn intermittently.  No chest pain.  She was evaluated in March with chest pain and shortness of breath.  Has chronic T wave inversion on EKG reviewed from 2018.  No exertional symptoms.  She does have strong family history of heart disease.  She takes aspirin and Crestor.  Has not been treating the reflux.  Herniated cervical disc being treated by Ortho with physical therapy.  Has right hand numbness.  Diagnosed by EMG nerve conduction studies.  Hoping to avoid surgery.  Insomnia is well controlled anxiety is well controlled rare Xanax use.  Continues on Lexapro without problems.  Has GYN exam upcoming.  Due for mammogram.  Pap smear is normal and up-to-date.  Assessment  1. Essential hypertension with goal blood pressure less than 140/90   2. Breast cancer screening   3. GAD (generalized anxiety disorder)   4. Primary insomnia   5. Gastric banding status   6. Mixed hyperlipidemia      Plan   Chronic medical problems are well controlled.  Continue current  medications  Discussed GERD and gastric banding status: Start PPI, monitor for improvement in symptoms and follow-up with surgery.  Consider removing gastric band.  Discussed healthy diet and exercise.  Health maintenance: Mammogram ordered.  Follow-up with GYN.  Follow up:  Return in about 3 months (around 09/24/2018) for complete physical. Orders Placed This Encounter  Procedures  . HM MAMMOGRAPHY  . MM Digital Screening  . HM HEPATITIS C SCREENING LAB  . HM PAP SMEAR  . HM COLONOSCOPY   Meds ordered this encounter  Medications  . omeprazole (PRILOSEC) 20 MG capsule    Sig: Take 1 capsule (20 mg total) by mouth daily.    Dispense:  30 capsule    Refill:  2     Depression screen [facility] 2/9 06/24/2018  Decreased Interest 0  Down, Depressed, Hopeless 0  PHQ - 2 Score 0  Altered sleeping 0  Tired, decreased energy 0  Change in appetite 0  Feeling bad or failure about yourself  0  Trouble concentrating 0  Moving slowly or fidgety/restless 0  Suicidal thoughts 0  PHQ-9 Score 0    We updated and reviewed the patient's past history in detail and it is documented below.  Patient Active Problem List   Diagnosis Date Noted  . GAD (generalized anxiety disorder) 06/24/2018    Priority: High    Overview:  Did well on paxil but stopped due to weight gain; wellbutrin - failed after 6 months. stabilized on lexapro.   [name]  Mixed hyperlipidemia 06/24/2018    Priority: High  . Essential hypertension with goal blood pressure less than 140/90 05/31/2018    Priority: High    Overview:  Nl Echo 02/2011   . Body mass index (BMI) of 40.0 to 44.9 in adult [facility]) 02/24/2018    Priority: High  . Family history of premature CAD 12/29/2013    Priority: High    Overview:  Father, 2 yo   . Post-therapeutic hypothyroidism 12/29/2013    Priority: High    Overview:  RAI    . History of TIA (transient ischemic attack) 12/10/1980    Priority: High    When on OCPs   . Mild intermittent asthma  without complication 97/01/6377    Priority: Medium    Overview:  Negative sleep study, 04/2011   . Primary osteoarthritis of both knees 09/11/2016    Priority: Medium    Overview:  Dr. Noemi Chapel   . Gastric banding status 10/07/2015    Priority: Medium  . Restless legs syndrome (RLS) 02/14/2015    Priority: Medium  . Insomnia 12/29/2013    Priority: Medium    Overview:  Failed lunesta and ambien and trazadone; Uses xanax rarely    Health Maintenance  Topic Date Due  . MAMMOGRAM  03/22/2018  . INFLUENZA VACCINE  07/10/2018  . PAP SMEAR  12/10/2018  . TETANUS/TDAP  12/10/2021  . COLONOSCOPY  10/24/2025  . Hepatitis C Screening  Completed  . HIV Screening  Completed   Immunization History  Administered Date(s) Administered  . Influenza, Seasonal, Injecte, Preservative Fre 08/20/2014, 09/12/2015  . Influenza,inj,Quad PF,6+ Mos 09/11/2016, 09/25/2017  . Pneumococcal Polysaccharide-23 12/10/2012  . Tdap 12/11/2011   Current Meds  Medication Sig  . albuterol (PROVENTIL HFA;VENTOLIN HFA) 108 (90 BASE) MCG/ACT inhaler Inhale 2 puffs into the lungs every 6 (six) hours as needed. For asthma  . ALPRAZolam (XANAX) 0.25 MG tablet Take 0.25 mg by mouth 2 (two) times daily as needed for anxiety.  [name] amLODipine (NORVASC) 5 MG tablet Take 5 mg by mouth every morning.   . escitalopram (LEXAPRO) 10 MG tablet Take 10 mg by mouth daily.  [name] gabapentin (NEURONTIN) 100 MG capsule Take 100 mg by mouth daily. Takes two 100 tablets  . hydrochlorothiazide 25 MG tablet Take 25 mg by mouth every other day.   . levothyroxine (SYNTHROID, LEVOTHROID) 112 MCG tablet Take 112 mcg by mouth every morning.   . Multiple Vitamins-Minerals (MULTIVITAMIN WITH MINERALS) tablet Take 1 tablet by mouth daily.  . rosuvastatin (CRESTOR) 40 MG tablet Take 40 mg by mouth daily.    Allergies: Patient is allergic to aspirin and penicillins. Past Medical History Patient  has a past medical history of Anemia, Anxiety,  Asthma, Heart murmur, TIA (transient ischemic attack) (1992), Hyperlipidemia, Hypertension, Hypothyroidism, Obesity, and Polycystic ovarian syndrome. Past Surgical History Patient  has a past surgical history that includes Knee cartilage surgery (2005); Abdominal hysterectomy (2009); Tonsillectomy (2007); Femur fracture surgery (2008); Laparoscopic gastric banding (11/12/2011); and I&D extremity (Left, 06/01/2018). Family History: Patient family history includes Anesthesia problems in her sister; Breast cancer (age of onset: 31) in her maternal aunt; Cancer in her maternal aunt; Glaucoma in her mother; Heart disease in her father; Migraines in her sister; Myasthenia gravis in her paternal grandfather; Ovarian cancer in her maternal grandmother; Stroke in her father; Uterine cancer in her maternal grandmother. Social History:  Patient  reports that she has never smoked. She has never used smokeless tobacco. She reports that she does  not drink alcohol or use drugs.  Review of Systems: Constitutional: negative for fever or malaise Ophthalmic: negative for photophobia, double vision or loss of vision Cardiovascular: negative for chest pain, dyspnea on exertion, or new LE swelling Respiratory: negative for SOB or persistent cough Gastrointestinal: negative for abdominal pain, change in bowel habits or melena Genitourinary: negative for dysuria or gross hematuria Musculoskeletal: negative for new gait disturbance or muscular weakness Integumentary: negative for new or persistent rashes Neurological: negative for TIA or stroke symptoms Psychiatric: negative for SI or delusions Allergic/Immunologic: negative for hives  Patient Care Team    Relationship Specialty Notifications Start End  Leamon Arnt, MD PCP - General Family Medicine  06/24/18     Objective  Vitals: BP 120/82   Pulse 79   Temp 98.2 F (36.8 C)   Ht 5\' 2"  (1.575 m)   Wt 228 lb 6.4 oz (103.6 kg)   SpO2 97%   BMI 41.77 kg/m   General:  Well developed, well nourished, no acute distress  Psych:  Alert and oriented,normal mood and affect HEENT:  Normocephalic, atraumatic, non-icteric sclera, PERRL, oropharynx is without mass or exudate, supple neck without adenopathy, mass or thyromegaly Cardiovascular:  RRR without gallop, rub or murmur, nondisplaced PMI Respiratory:  Good breath sounds bilaterally, CTAB with normal respiratory effort MSK: no deformities, contusions. Joints are without erythema or swelling   Commons side effects, risks, benefits, and alternatives for medications and treatment plan prescribed today were discussed, and the patient expressed understanding of the given instructions. Patient is instructed to call or message via MyChart if he/she has any questions or concerns regarding our treatment plan. No barriers to understanding were identified. We discussed Red Flag symptoms and signs in detail. Patient expressed understanding regarding what to do in case of urgent or emergency type symptoms.   Medication list was reconciled, printed and provided to the patient in AVS. Patient instructions and summary information was reviewed with the patient as documented in the AVS. This note was prepared with assistance of Dragon voice recognition software. Occasional wrong-word or sound-a-like substitutions may have occurred due to the inherent limitations of voice recognition software

## 2018-06-30 ENCOUNTER — Ambulatory Visit (INDEPENDENT_AMBULATORY_CARE_PROVIDER_SITE_OTHER): Payer: 59 | Admitting: Physician Assistant

## 2018-08-14 ENCOUNTER — Other Ambulatory Visit: Payer: Self-pay

## 2018-08-14 DIAGNOSIS — I6523 Occlusion and stenosis of bilateral carotid arteries: Secondary | ICD-10-CM

## 2018-08-18 ENCOUNTER — Ambulatory Visit (HOSPITAL_COMMUNITY)
Admission: RE | Admit: 2018-08-18 | Discharge: 2018-08-18 | Disposition: A | Discharge status: Home / Self Care | Payer: 59 | Source: Ambulatory Visit | Attending: Vascular Surgery | Admitting: Vascular Surgery

## 2018-08-18 DIAGNOSIS — I6523 Occlusion and stenosis of bilateral carotid arteries: Secondary | ICD-10-CM

## 2018-08-19 ENCOUNTER — Ambulatory Visit (INDEPENDENT_AMBULATORY_CARE_PROVIDER_SITE_OTHER): Payer: 59 | Admitting: Vascular Surgery

## 2018-08-19 ENCOUNTER — Encounter: Payer: Self-pay | Admitting: Vascular Surgery

## 2018-08-19 VITALS — BP 119/78 | HR 74 | Temp 97.7°F | Resp 18 | Ht 62.0 in | Wt 231.0 lb

## 2018-08-19 DIAGNOSIS — Z8673 Personal history of transient ischemic attack (TIA), and cerebral infarction without residual deficits: Secondary | ICD-10-CM

## 2018-08-19 NOTE — Progress Notes (Signed)
Patient name: Alison [name] MRN: [id number] DOB: [birth year] Sex: female  REASON FOR CONSULT: Carotid evaluation prior to anterior cervical fusion  HPI: Alison [name] is a 57 y.o. female with history of hypertension and hyperlipidemia that presents for bilateral carotid evaluation prior to anterior cervical fusion.  She is scheduled for an anterior cervical fusion with Dr. Lynann Bologna due to ongoing numbness in the right hand.  She states given a known history of TIA when she was 57 years old as well as a Restaurant manager, fast food that told her she had some carotid calcification she wanted to be evaluated by vascular surgeon prior to her spine fusion.  Patient has a cervical kyphosis and states her chiropractor initially discovered some carotid calcification on imaging.  She denies any recent symptoms of a TIA or stroke.  She does describe a remote history of a TIA when she was 23 and taking birth control and said she had some arm and leg numbness with some aphasia with no recurrent symptoms since that time.  She does not smoke.  She does take Crestor for control of her hyperlipidemia.  Her blood pressure is well controlled.  Past Medical History:  Diagnosis Date  . Anemia    prior to hysterectomy   . Anxiety   . Asthma   . Heart murmur   . Hx-TIA (transient ischemic attack) 1992  . Hyperlipidemia   . Hypertension   . Hypothyroidism   . Obesity   . Polycystic ovarian syndrome     Past Surgical History:  Procedure Laterality Date  . ABDOMINAL HYSTERECTOMY  2009   Partial  . FEMUR FRACTURE SURGERY  2008   Left  . I&D EXTREMITY Left 06/01/2018   Procedure: IRRIGATION AND DEBRIDEMENT LEFT CALF;  Surgeon: Mcarthur Rossetti, MD;  Location: [facility];  Service: Orthopedics;  Laterality: Left;  . KNEE CARTILAGE SURGERY  2005  . LAPAROSCOPIC GASTRIC BANDING  11/12/2011   Procedure: LAPAROSCOPIC GASTRIC BANDING;  Surgeon: Judieth Keens, DO;  Location: [facility];  Service: General;  Laterality: N/A;   nathanson liver retractor/ endostitch available 2-0 surgidac refills/ layton needle drivers Economist to Pensions consultant  . TONSILLECTOMY  2007    Family History  Problem Relation Age of Onset  . Glaucoma Mother   . Heart disease Father   . Stroke Father   . Anesthesia problems Sister   . Migraines Sister   . Cancer Maternal Aunt        Breast Cancer  . Breast cancer Maternal Aunt 65  . Ovarian cancer Maternal Grandmother   . Uterine cancer Maternal Grandmother   . Myasthenia gravis Paternal Grandfather     SOCIAL HISTORY: Social History   Socioeconomic History  . Marital status: Single    Spouse name: Not on file  . Number of children: Not on file  . Years of education: Not on file  . Highest education level: Not on file  Occupational History  . Not on file  Social Needs  . Financial resource strain: Not on file  . Food insecurity:    Worry: Not on file    Inability: Not on file  . Transportation needs:    Medical: Not on file    Non-medical: Not on file  Tobacco Use  . Smoking status: Never Smoker  . Smokeless tobacco: Never Used  Substance and Sexual Activity  . Alcohol use: No    Comment: occasionally  . Drug use: No  . Sexual activity: Not on  file  Lifestyle  . Physical activity:    Days per week: Not on file    Minutes per session: Not on file  . Stress: Not on file  Relationships  . Social connections:    Talks on phone: Not on file    Gets together: Not on file    Attends religious service: Not on file    Active member of club or organization: Not on file    Attends meetings of clubs or organizations: Not on file    Relationship status: Not on file  . Intimate partner violence:    Fear of current or ex partner: Not on file    Emotionally abused: Not on file    Physically abused: Not on file    Forced sexual activity: Not on file  Other Topics Concern  . Not on file  Social History Narrative  . Not on file    Allergies  Allergen  Reactions  . Aspirin Anaphylaxis    REACTION: Anaphlactic reaction.  [name] Penicillins Itching    Current Outpatient Medications  Medication Sig Dispense Refill  . albuterol (PROVENTIL HFA;VENTOLIN HFA) 108 (90 BASE) MCG/ACT inhaler Inhale 2 puffs into the lungs every 6 (six) hours as needed. For asthma    . ALPRAZolam (XANAX) 0.25 MG tablet Take 0.25 mg by mouth 2 (two) times daily as needed for anxiety.    [name] amLODipine (NORVASC) 5 MG tablet Take 5 mg by mouth every morning.     . escitalopram (LEXAPRO) 10 MG tablet Take 10 mg by mouth daily.    . hydrochlorothiazide 25 MG tablet Take 25 mg by mouth every other day.     . levothyroxine (SYNTHROID, LEVOTHROID) 112 MCG tablet Take 112 mcg by mouth every morning.     . Multiple Vitamins-Minerals (MULTIVITAMIN WITH MINERALS) tablet Take 1 tablet by mouth daily.    [name] omeprazole (PRILOSEC) 20 MG capsule Take 1 capsule (20 mg total) by mouth daily. 30 capsule 2  . rosuvastatin (CRESTOR) 40 MG tablet Take 40 mg by mouth daily.    [name] gabapentin (NEURONTIN) 100 MG capsule Take 100 mg by mouth daily. Takes two 100 tablets     No current facility-administered medications for this visit.     REVIEW OF SYSTEMS:  [X]  denotes positive finding, [ ]  denotes negative finding Cardiac  Comments:  Chest pain or chest pressure:    Shortness of breath upon exertion:    Short of breath when lying flat:    Irregular heart rhythm:        Vascular    Pain in calf, thigh, or hip brought on by ambulation:    Pain in feet at night that wakes you up from your sleep:     Blood clot in your veins:    Leg swelling:         Pulmonary    Oxygen at home:    Productive cough:     Wheezing:         Neurologic    Sudden weakness in arms or legs:   Denies  Sudden numbness in arms or legs:   Denies  Sudden onset of difficulty speaking or slurred speech:  Denies  Temporary loss of vision in one eye:   Denies  Problems with dizziness:         Gastrointestinal      Blood in stool:     Vomited blood:         Genitourinary    Burning when  urinating:     Blood in urine:        Psychiatric    Major depression:         Hematologic    Bleeding problems:    Problems with blood clotting too easily:        Skin    Rashes or ulcers:        Constitutional    Fever or chills:      PHYSICAL EXAM: Vitals:   08/19/18 1005 08/19/18 1007  BP: 105/74 119/78  Pulse: 74   Resp: 18   Temp: 97.7 F (36.5 C)   TempSrc: Oral   SpO2: 97%   Weight: 104.8 kg   Height: 5\' 2"  (1.575 m)     GENERAL: The patient is a well-nourished female, in no acute distress. The vital signs are documented above. CARDIAC: There is a regular rate and rhythm.  VASCULAR:  2+ radial pulse palpable BUE 2+ femoral pulse palpable bilateral groins 2+ pedal pulses palpable bilaterally PULMONARY: There is good air exchange bilaterally without wheezing or rales. ABDOMEN: Soft and non-tender with normal pitched bowel sounds.  MUSCULOSKELETAL: There are no major deformities or cyanosis. NEUROLOGIC: No focal weakness or paresthesias are detected.  CN II-XII grossly intact.  No carotid bruit.  SKIN: There are no ulcers or rashes noted. PSYCHIATRIC: The patient has a normal affect.  DATA:   I have independently reviewed her noninvasive imaging.  She has no evidence of elevated ICA velocities bilaterally.  Her stenoses is in the 1 to 39% category.  Assessment/Plan:  57 year old female who presents for bilateral carotid evaluation prior to anterior cervical fusion.  I think she is safe to proceed with her cervical fusion and should be low risk from a stroke standpoint.  She does not need any surgical intervention.  I think she is maximally medically managed with her medications - states does have an aspirin allergy.  She wants to come back in a year for ongoing surveillance given strong family history.  Will repeat carotid duplex at that time.    Marty Heck, MD [facility] Office: [phone] Pager: [place]

## 2018-08-20 ENCOUNTER — Encounter: Payer: Self-pay | Admitting: Sports Medicine

## 2018-08-22 ENCOUNTER — Other Ambulatory Visit: Payer: Self-pay | Admitting: Orthopedic Surgery

## 2018-08-27 ENCOUNTER — Other Ambulatory Visit: Payer: Self-pay

## 2018-08-27 ENCOUNTER — Encounter (HOSPITAL_COMMUNITY): Payer: Self-pay | Admitting: *Deleted

## 2018-08-27 NOTE — Progress Notes (Signed)
Pt denies SOB, chest pain, and being under the care of a cardiologist. Pt denies having a cardiac cath but stated that a stress test was performed 10 years ago and an echo was done 15 years ago. Pt denies recent labs. [facility] tracing [facility]. Pt made aware to stop taking  Aspirin, vitamins, fish oil and herbal medications. Do not take any NSAIDs ie: Ibuprofen, Advil, Naproxen (Aleve), Motrin, BC and Goody Powder. Pt verbalized understanding of all pre-op instructions.

## 2018-08-28 ENCOUNTER — Ambulatory Visit (HOSPITAL_COMMUNITY): Payer: 59 | Admitting: Certified Registered Nurse Anesthetist

## 2018-08-28 ENCOUNTER — Encounter (HOSPITAL_COMMUNITY): Payer: Self-pay

## 2018-08-28 ENCOUNTER — Other Ambulatory Visit: Payer: Self-pay

## 2018-08-28 ENCOUNTER — Ambulatory Visit (HOSPITAL_COMMUNITY)
Admission: RE | Admit: 2018-08-28 | Discharge: 2018-08-29 | Disposition: A | Discharge status: Home / Self Care | Payer: 59 | Source: Ambulatory Visit | Attending: Orthopedic Surgery | Admitting: Orthopedic Surgery

## 2018-08-28 ENCOUNTER — Encounter (HOSPITAL_COMMUNITY)
Admission: RE | Disposition: A | Discharge status: Home / Self Care | Payer: Self-pay | Source: Ambulatory Visit | Attending: Orthopedic Surgery

## 2018-08-28 ENCOUNTER — Ambulatory Visit (HOSPITAL_COMMUNITY): Payer: 59

## 2018-08-28 DIAGNOSIS — G992 Myelopathy in diseases classified elsewhere: Secondary | ICD-10-CM | POA: Insufficient documentation

## 2018-08-28 DIAGNOSIS — E669 Obesity, unspecified: Secondary | ICD-10-CM | POA: Diagnosis not present

## 2018-08-28 DIAGNOSIS — E785 Hyperlipidemia, unspecified: Secondary | ICD-10-CM | POA: Insufficient documentation

## 2018-08-28 DIAGNOSIS — I10 Essential (primary) hypertension: Secondary | ICD-10-CM | POA: Diagnosis not present

## 2018-08-28 DIAGNOSIS — Z79899 Other long term (current) drug therapy: Secondary | ICD-10-CM | POA: Diagnosis not present

## 2018-08-28 DIAGNOSIS — Z8673 Personal history of transient ischemic attack (TIA), and cerebral infarction without residual deficits: Secondary | ICD-10-CM | POA: Diagnosis not present

## 2018-08-28 DIAGNOSIS — Z6841 Body Mass Index (BMI) 40.0 and over, adult: Secondary | ICD-10-CM | POA: Diagnosis not present

## 2018-08-28 DIAGNOSIS — J45909 Unspecified asthma, uncomplicated: Secondary | ICD-10-CM | POA: Diagnosis not present

## 2018-08-28 DIAGNOSIS — M4802 Spinal stenosis, cervical region: Secondary | ICD-10-CM | POA: Insufficient documentation

## 2018-08-28 DIAGNOSIS — Z419 Encounter for procedure for purposes other than remedying health state, unspecified: Secondary | ICD-10-CM

## 2018-08-28 DIAGNOSIS — E039 Hypothyroidism, unspecified: Secondary | ICD-10-CM | POA: Diagnosis not present

## 2018-08-28 DIAGNOSIS — F419 Anxiety disorder, unspecified: Secondary | ICD-10-CM | POA: Diagnosis not present

## 2018-08-28 DIAGNOSIS — Z79890 Hormone replacement therapy (postmenopausal): Secondary | ICD-10-CM | POA: Diagnosis not present

## 2018-08-28 DIAGNOSIS — G959 Disease of spinal cord, unspecified: Secondary | ICD-10-CM

## 2018-08-28 HISTORY — DX: Disease of spinal cord, unspecified: G95.9

## 2018-08-28 HISTORY — PX: ANTERIOR CERVICAL DECOMPRESSION/DISCECTOMY FUSION 4 LEVELS: SHX5556

## 2018-08-28 LAB — CBC WITH DIFFERENTIAL/PLATELET
Abs Immature Granulocytes: 0 10*3/uL (ref 0.0–0.1)
Basophils Absolute: 0.1 10*3/uL (ref 0.0–0.1)
Basophils Relative: 1 %
Eosinophils Absolute: 0.4 10*3/uL (ref 0.0–0.7)
Eosinophils Relative: 4 %
HCT: 45 % (ref 36.0–46.0)
Hemoglobin: 15.3 g/dL — ABNORMAL HIGH (ref 12.0–15.0)
Immature Granulocytes: 0 %
Lymphocytes Relative: 34 %
Lymphs Abs: 3.3 10*3/uL (ref 0.7–4.0)
MCH: 30.2 pg (ref 26.0–34.0)
MCHC: 34 g/dL (ref 30.0–36.0)
MCV: 88.8 fL (ref 78.0–100.0)
Monocytes Absolute: 0.5 10*3/uL (ref 0.1–1.0)
Monocytes Relative: 5 %
Neutro Abs: 5.6 10*3/uL (ref 1.7–7.7)
Neutrophils Relative %: 56 %
Platelets: 231 10*3/uL (ref 150–400)
RBC: 5.07 MIL/uL (ref 3.87–5.11)
RDW: 12.7 % (ref 11.5–15.5)
WBC: 9.9 10*3/uL (ref 4.0–10.5)

## 2018-08-28 LAB — COMPREHENSIVE METABOLIC PANEL
ALT: 19 U/L (ref 0–44)
AST: 21 U/L (ref 15–41)
Albumin: 3.7 g/dL (ref 3.5–5.0)
Alkaline Phosphatase: 97 U/L (ref 38–126)
Anion gap: 11 (ref 5–15)
BUN: 13 mg/dL (ref 6–20)
CO2: 25 mmol/L (ref 22–32)
Calcium: 9.4 mg/dL (ref 8.9–10.3)
Chloride: 100 mmol/L (ref 98–111)
Creatinine, Ser: 0.85 mg/dL (ref 0.44–1.00)
GFR calc Af Amer: >60 mL/min (ref >60)
GFR calc non Af Amer: >60 mL/min (ref >60)
Glucose, Bld: 102 mg/dL — ABNORMAL HIGH (ref 70–99)
Potassium: 2.9 mmol/L — ABNORMAL LOW (ref 3.5–5.1)
Sodium: 136 mmol/L (ref 135–145)
Total Bilirubin: 0.8 mg/dL (ref 0.3–1.2)
Total Protein: 6.8 g/dL (ref 6.5–8.1)

## 2018-08-28 LAB — URINALYSIS, ROUTINE W REFLEX MICROSCOPIC
Bilirubin Urine: NEGATIVE
Glucose, UA: NEGATIVE mg/dL
Hgb urine dipstick: NEGATIVE
Ketones, ur: NEGATIVE mg/dL
Leukocytes, UA: NEGATIVE
Nitrite: NEGATIVE
Protein, ur: NEGATIVE mg/dL
Specific Gravity, Urine: 1.02 (ref 1.005–1.030)
pH: 6 (ref 5.0–8.0)

## 2018-08-28 LAB — ABO/RH: ABO/RH(D): O POS

## 2018-08-28 LAB — TYPE AND SCREEN
ABO/RH(D): O POS
Antibody Screen: NEGATIVE

## 2018-08-28 LAB — PROTIME-INR
INR: 1.08
Prothrombin Time: 13.9 seconds (ref 11.4–15.2)

## 2018-08-28 LAB — APTT: aPTT: 37 seconds — ABNORMAL HIGH (ref 24–36)

## 2018-08-28 SURGERY — Surgical Case
Anesthesia: *Unknown

## 2018-08-28 SURGERY — ANTERIOR CERVICAL DECOMPRESSION/DISCECTOMY FUSION 4 LEVELS
Anesthesia: General | Site: Neck

## 2018-08-28 MED ORDER — SUGAMMADEX SODIUM 200 MG/2ML IV SOLN
INTRAVENOUS | Status: DC | PRN
  Administered 2018-08-28: 200 mg via INTRAVENOUS

## 2018-08-28 MED ORDER — VANCOMYCIN HCL 500 MG IV SOLR
500.0000 mg | INTRAVENOUS | Status: DC
  Filled 2018-08-28: qty 500

## 2018-08-28 MED ORDER — VANCOMYCIN HCL IN DEXTROSE 1-5 GM/200ML-% IV SOLN
1000.0000 mg | INTRAVENOUS | Status: AC
  Administered 2018-08-28: 1000 mg via INTRAVENOUS

## 2018-08-28 MED ORDER — FENTANYL CITRATE (PF) 250 MCG/5ML IJ SOLN
INTRAMUSCULAR | Status: AC
  Filled 2018-08-28: qty 5

## 2018-08-28 MED ORDER — SODIUM CHLORIDE 0.9% FLUSH: 3.0000 mL | Freq: Two times a day (BID) | INTRAVENOUS | Status: DC

## 2018-08-28 MED ORDER — ONDANSETRON HCL 4 MG/2ML IJ SOLN
INTRAMUSCULAR | Status: AC
  Filled 2018-08-28: qty 2

## 2018-08-28 MED ORDER — MIDAZOLAM HCL 2 MG/2ML IJ SOLN
INTRAMUSCULAR | Status: AC
  Filled 2018-08-28: qty 2

## 2018-08-28 MED ORDER — ROSUVASTATIN CALCIUM 20 MG PO TABS
40.0000 mg | ORAL_TABLET | Freq: Every evening | ORAL | Status: DC
Start: 2018-08-28 — End: 2018-08-29
  Administered 2018-08-28: 40 mg via ORAL
  Filled 2018-08-28: qty 2

## 2018-08-28 MED ORDER — ROCURONIUM BROMIDE 50 MG/5ML IV SOSY
PREFILLED_SYRINGE | INTRAVENOUS | Status: AC
  Filled 2018-08-28: qty 25

## 2018-08-28 MED ORDER — ONDANSETRON HCL 4 MG/2ML IJ SOLN: 4.0000 mg | Freq: Four times a day (QID) | INTRAMUSCULAR | Status: DC | PRN

## 2018-08-28 MED ORDER — EPHEDRINE SULFATE-NACL 50-0.9 MG/10ML-% IV SOSY
PREFILLED_SYRINGE | INTRAVENOUS | Status: DC | PRN
  Administered 2018-08-28: 10 mg via INTRAVENOUS

## 2018-08-28 MED ORDER — PHENOL 1.4 % MT LIQD: 1.0000 | OROMUCOSAL | Status: DC | PRN

## 2018-08-28 MED ORDER — ACETAMINOPHEN 325 MG PO TABS: 650.0000 mg | ORAL_TABLET | ORAL | Status: DC | PRN

## 2018-08-28 MED ORDER — THROMBIN (RECOMBINANT) 20000 UNITS EX SOLR
CUTANEOUS | Status: AC
  Filled 2018-08-28: qty 20000

## 2018-08-28 MED ORDER — POTASSIUM CHLORIDE IN NACL 20-0.9 MEQ/L-% IV SOLN
INTRAVENOUS | Status: DC
  Administered 2018-08-28: 17:00:00 via INTRAVENOUS

## 2018-08-28 MED ORDER — OXYCODONE HCL 5 MG PO TABS: 5.0000 mg | ORAL_TABLET | Freq: Once | ORAL | Status: DC | PRN

## 2018-08-28 MED ORDER — VANCOMYCIN HCL 500 MG IV SOLR
500.0000 mg | Freq: Two times a day (BID) | INTRAVENOUS | Status: DC
  Filled 2018-08-28: qty 500

## 2018-08-28 MED ORDER — ROCURONIUM BROMIDE 10 MG/ML (PF) SYRINGE
PREFILLED_SYRINGE | INTRAVENOUS | Status: DC | PRN
  Administered 2018-08-28: 50 mg via INTRAVENOUS
  Administered 2018-08-28: 20 mg via INTRAVENOUS
  Administered 2018-08-28: 10 mg via INTRAVENOUS
  Administered 2018-08-28: 20 mg via INTRAVENOUS

## 2018-08-28 MED ORDER — FENTANYL CITRATE (PF) 100 MCG/2ML IJ SOLN
25.0000 ug | INTRAMUSCULAR | Status: DC | PRN
  Administered 2018-08-28 (×2): 50 ug via INTRAVENOUS

## 2018-08-28 MED ORDER — CEFAZOLIN SODIUM-DEXTROSE 1-4 GM/50ML-% IV SOLN
1.0000 g | Freq: Three times a day (TID) | INTRAVENOUS | Status: AC
  Administered 2018-08-28 – 2018-08-29 (×2): 1 g via INTRAVENOUS
  Filled 2018-08-28 (×2): qty 50

## 2018-08-28 MED ORDER — FENTANYL CITRATE (PF) 100 MCG/2ML IJ SOLN
INTRAMUSCULAR | Status: DC | PRN
  Administered 2018-08-28: 100 ug via INTRAVENOUS
  Administered 2018-08-28: 50 ug via INTRAVENOUS
  Administered 2018-08-28: 150 ug via INTRAVENOUS

## 2018-08-28 MED ORDER — AMLODIPINE BESYLATE 5 MG PO TABS
5.0000 mg | ORAL_TABLET | Freq: Every day | ORAL | Status: DC
  Filled 2018-08-28: qty 1

## 2018-08-28 MED ORDER — PROPOFOL 10 MG/ML IV BOLUS
INTRAVENOUS | Status: AC
  Filled 2018-08-28: qty 20

## 2018-08-28 MED ORDER — LIDOCAINE 2% (20 MG/ML) 5 ML SYRINGE
INTRAMUSCULAR | Status: DC | PRN
  Administered 2018-08-28: 40 mg via INTRAVENOUS

## 2018-08-28 MED ORDER — DEXAMETHASONE SODIUM PHOSPHATE 10 MG/ML IJ SOLN
INTRAMUSCULAR | Status: AC
  Filled 2018-08-28: qty 1

## 2018-08-28 MED ORDER — ONDANSETRON HCL 4 MG PO TABS: 4.0000 mg | ORAL_TABLET | Freq: Four times a day (QID) | ORAL | Status: DC | PRN

## 2018-08-28 MED ORDER — DIPHENHYDRAMINE HCL 2 % EX CREA: 1.0000 "application " | TOPICAL_CREAM | Freq: Every day | CUTANEOUS | Status: DC | PRN

## 2018-08-28 MED ORDER — VANCOMYCIN HCL IN DEXTROSE 1-5 GM/200ML-% IV SOLN
INTRAVENOUS | Status: AC
  Administered 2018-08-28: 1000 mg via INTRAVENOUS
  Filled 2018-08-28: qty 200

## 2018-08-28 MED ORDER — DEXAMETHASONE SODIUM PHOSPHATE 10 MG/ML IJ SOLN
INTRAMUSCULAR | Status: DC | PRN
  Administered 2018-08-28: 5 mg via INTRAVENOUS

## 2018-08-28 MED ORDER — ONDANSETRON HCL 4 MG/2ML IJ SOLN: 4.0000 mg | Freq: Once | INTRAMUSCULAR | Status: DC | PRN

## 2018-08-28 MED ORDER — BUPIVACAINE-EPINEPHRINE 0.25% -1:200000 IJ SOLN
INTRAMUSCULAR | Status: DC | PRN
  Administered 2018-08-28: 10 mL

## 2018-08-28 MED ORDER — FENTANYL CITRATE (PF) 100 MCG/2ML IJ SOLN
INTRAMUSCULAR | Status: AC
  Filled 2018-08-28: qty 2

## 2018-08-28 MED ORDER — DIPHENHYDRAMINE-ZINC ACETATE 2-0.1 % EX CREA
TOPICAL_CREAM | Freq: Every day | CUTANEOUS | Status: DC | PRN
  Filled 2018-08-28: qty 28

## 2018-08-28 MED ORDER — ZOLPIDEM TARTRATE 5 MG PO TABS
5.0000 mg | ORAL_TABLET | Freq: Every evening | ORAL | Status: DC | PRN
  Filled 2018-08-28: qty 1

## 2018-08-28 MED ORDER — ALBUTEROL SULFATE (2.5 MG/3ML) 0.083% IN NEBU: 3.0000 mL | INHALATION_SOLUTION | Freq: Four times a day (QID) | RESPIRATORY_TRACT | Status: DC | PRN

## 2018-08-28 MED ORDER — HYDROCHLOROTHIAZIDE 25 MG PO TABS
25.0000 mg | ORAL_TABLET | ORAL | Status: DC
  Filled 2018-08-28: qty 1

## 2018-08-28 MED ORDER — OXYCODONE HCL 5 MG/5ML PO SOLN: 5.0000 mg | Freq: Once | ORAL | Status: DC | PRN

## 2018-08-28 MED ORDER — 0.9 % SODIUM CHLORIDE (POUR BTL) OPTIME
TOPICAL | Status: DC | PRN
  Administered 2018-08-28: 1000 mL

## 2018-08-28 MED ORDER — POVIDONE-IODINE 7.5 % EX SOLN
Freq: Once | CUTANEOUS | Status: DC
  Filled 2018-08-28: qty 118

## 2018-08-28 MED ORDER — LACTATED RINGERS IV SOLN
INTRAVENOUS | Status: DC
  Administered 2018-08-28 (×2): via INTRAVENOUS

## 2018-08-28 MED ORDER — MIDAZOLAM HCL 2 MG/2ML IJ SOLN
INTRAMUSCULAR | Status: DC | PRN
  Administered 2018-08-28: 2 mg via INTRAVENOUS

## 2018-08-28 MED ORDER — OXYCODONE-ACETAMINOPHEN 5-325 MG PO TABS
ORAL_TABLET | ORAL | Status: AC
  Filled 2018-08-28: qty 1

## 2018-08-28 MED ORDER — DIAZEPAM 5 MG PO TABS
5.0000 mg | ORAL_TABLET | Freq: Four times a day (QID) | ORAL | Status: DC | PRN
  Administered 2018-08-28 (×2): 5 mg via ORAL
  Filled 2018-08-28 (×2): qty 1

## 2018-08-28 MED ORDER — SODIUM CHLORIDE 0.9% FLUSH: 3.0000 mL | INTRAVENOUS | Status: DC | PRN

## 2018-08-28 MED ORDER — LORATADINE 10 MG PO TABS
10.0000 mg | ORAL_TABLET | Freq: Every day | ORAL | Status: DC
  Administered 2018-08-29: 10 mg via ORAL
  Filled 2018-08-28: qty 1

## 2018-08-28 MED ORDER — OXYCODONE-ACETAMINOPHEN 5-325 MG PO TABS
1.0000 | ORAL_TABLET | ORAL | Status: DC | PRN
  Administered 2018-08-28: 1 via ORAL
  Administered 2018-08-28: 2 via ORAL
  Administered 2018-08-28 – 2018-08-29 (×3): 1 via ORAL
  Administered 2018-08-29: 2 via ORAL
  Filled 2018-08-28 (×4): qty 2
  Filled 2018-08-28: qty 1

## 2018-08-28 MED ORDER — SODIUM CHLORIDE 0.9 % IV SOLN: 250.0000 mL | INTRAVENOUS | Status: DC

## 2018-08-28 MED ORDER — THROMBIN 20000 UNITS EX SOLR
CUTANEOUS | Status: DC | PRN
  Administered 2018-08-28: 20000 [IU] via TOPICAL

## 2018-08-28 MED ORDER — MENTHOL 3 MG MT LOZG: 1.0000 | LOZENGE | OROMUCOSAL | Status: DC | PRN

## 2018-08-28 MED ORDER — PROPOFOL 10 MG/ML IV BOLUS
INTRAVENOUS | Status: DC | PRN
  Administered 2018-08-28: 150 mg via INTRAVENOUS

## 2018-08-28 MED ORDER — BUPIVACAINE-EPINEPHRINE (PF) 0.25% -1:200000 IJ SOLN
INTRAMUSCULAR | Status: AC
  Filled 2018-08-28: qty 30

## 2018-08-28 MED ORDER — PANTOPRAZOLE SODIUM 40 MG PO TBEC
40.0000 mg | DELAYED_RELEASE_TABLET | Freq: Every day | ORAL | Status: DC
  Administered 2018-08-29: 40 mg via ORAL
  Filled 2018-08-28: qty 1

## 2018-08-28 MED ORDER — ESCITALOPRAM OXALATE 10 MG PO TABS
10.0000 mg | ORAL_TABLET | Freq: Every day | ORAL | Status: DC
  Administered 2018-08-29: 10 mg via ORAL
  Filled 2018-08-28: qty 1

## 2018-08-28 MED ORDER — DIAZEPAM 5 MG PO TABS
ORAL_TABLET | ORAL | Status: AC
  Filled 2018-08-28: qty 1

## 2018-08-28 MED ORDER — ALUM & MAG HYDROXIDE-SIMETH 200-200-20 MG/5ML PO SUSP: 30.0000 mL | Freq: Four times a day (QID) | ORAL | Status: DC | PRN

## 2018-08-28 MED ORDER — ACETAMINOPHEN 650 MG RE SUPP: 650.0000 mg | RECTAL | Status: DC | PRN

## 2018-08-28 MED ORDER — LEVOTHYROXINE SODIUM 112 MCG PO TABS
112.0000 ug | ORAL_TABLET | Freq: Every day | ORAL | Status: DC
  Administered 2018-08-29: 112 ug via ORAL
  Filled 2018-08-28: qty 1

## 2018-08-28 MED ORDER — ONDANSETRON HCL 4 MG/2ML IJ SOLN
INTRAMUSCULAR | Status: DC | PRN
  Administered 2018-08-28: 4 mg via INTRAVENOUS

## 2018-08-28 MED ORDER — PHENYLEPHRINE 40 MCG/ML (10ML) SYRINGE FOR IV PUSH (FOR BLOOD PRESSURE SUPPORT)
PREFILLED_SYRINGE | INTRAVENOUS | Status: DC | PRN
  Administered 2018-08-28 (×2): 80 ug via INTRAVENOUS
  Administered 2018-08-28: 40 ug via INTRAVENOUS

## 2018-08-28 MED ORDER — ALPRAZOLAM 0.25 MG PO TABS
0.2500 mg | ORAL_TABLET | Freq: Every day | ORAL | Status: DC | PRN
  Administered 2018-08-29: 0.25 mg via ORAL
  Filled 2018-08-28 (×2): qty 1

## 2018-08-28 SURGICAL SUPPLY — 71 items
BENZOIN TINCTURE PRP APPL 2/3 (GAUZE/BANDAGES/DRESSINGS) ×3 IMPLANT
BIT DRILL NEURO 2X3.1 SFT TUCH (MISCELLANEOUS) ×1 IMPLANT
BIT DRILL SRG 14X2.2XFLT CHK (BIT) ×1 IMPLANT
BIT DRL SRG 14X2.2XFLT CHK (BIT) ×1
BLADE CLIPPER SURG (BLADE) ×3 IMPLANT
BLADE SURG 15 STRL LF DISP TIS (BLADE) ×1 IMPLANT
BLADE SURG 15 STRL SS (BLADE) ×2
BUR MATCHSTICK NEURO 3.0 LAGG (BURR) IMPLANT
CARTRIDGE OIL MAESTRO DRILL (MISCELLANEOUS) ×1 IMPLANT
CLOSURE WOUND 1/2 X4 (GAUZE/BANDAGES/DRESSINGS) ×1
COVER SURGICAL LIGHT HANDLE (MISCELLANEOUS) ×3 IMPLANT
CRADLE DONUT ADULT HEAD (MISCELLANEOUS) ×3 IMPLANT
DECANTER SPIKE VIAL GLASS SM (MISCELLANEOUS) ×3 IMPLANT
DIFFUSER DRILL AIR PNEUMATIC (MISCELLANEOUS) ×3 IMPLANT
DRAIN JACKSON RD 7FR 3/32 (WOUND CARE) IMPLANT
DRAPE C-ARM 42X72 X-RAY (DRAPES) ×3 IMPLANT
DRAPE POUCH INSTRU U-SHP 10X18 (DRAPES) ×3 IMPLANT
DRAPE SURG 17X23 STRL (DRAPES) ×9 IMPLANT
DRILL BIT SKYLINE 14MM (BIT) ×2
DRILL NEURO 2X3.1 SOFT TOUCH (MISCELLANEOUS) ×3
DURAPREP 26ML APPLICATOR (WOUND CARE) ×3 IMPLANT
ELECT COATED BLADE 2.86 ST (ELECTRODE) ×3 IMPLANT
ELECT REM PT RETURN 9FT ADLT (ELECTROSURGICAL) ×3
ELECTRODE REM PT RTRN 9FT ADLT (ELECTROSURGICAL) ×1 IMPLANT
EVACUATOR SILICONE 100CC (DRAIN) IMPLANT
GAUZE 4X4 16PLY RFD (DISPOSABLE) ×3 IMPLANT
GAUZE SPONGE 4X4 12PLY STRL (GAUZE/BANDAGES/DRESSINGS) ×3 IMPLANT
GLOVE BIO SURGEON STRL SZ7 (GLOVE) ×3 IMPLANT
GLOVE BIO SURGEON STRL SZ8 (GLOVE) ×3 IMPLANT
GLOVE BIOGEL PI IND STRL 7.0 (GLOVE) ×2 IMPLANT
GLOVE BIOGEL PI IND STRL 8 (GLOVE) ×1 IMPLANT
GLOVE BIOGEL PI INDICATOR 7.0 (GLOVE) ×4
GLOVE BIOGEL PI INDICATOR 8 (GLOVE) ×2
GOWN STRL REUS W/ TWL LRG LVL3 (GOWN DISPOSABLE) ×1 IMPLANT
GOWN STRL REUS W/ TWL XL LVL3 (GOWN DISPOSABLE) ×1 IMPLANT
GOWN STRL REUS W/TWL LRG LVL3 (GOWN DISPOSABLE) ×2
GOWN STRL REUS W/TWL XL LVL3 (GOWN DISPOSABLE) ×2
INTERLOCK LRDTC CRVCL VBR 6MM (Bone Implant) ×3 IMPLANT
IV CATH 14GX2 1/4 (CATHETERS) ×3 IMPLANT
KIT BASIN OR (CUSTOM PROCEDURE TRAY) ×3 IMPLANT
KIT TURNOVER KIT B (KITS) ×3 IMPLANT
LORDOTIC CERVICAL VBR 6MM SM (Bone Implant) ×9 IMPLANT
MANIFOLD NEPTUNE II (INSTRUMENTS) ×3 IMPLANT
NEEDLE PRECISIONGLIDE 27X1.5 (NEEDLE) ×3 IMPLANT
NEEDLE SPNL 20GX3.5 QUINCKE YW (NEEDLE) ×3 IMPLANT
NS IRRIG 1000ML POUR BTL (IV SOLUTION) ×3 IMPLANT
OIL CARTRIDGE MAESTRO DRILL (MISCELLANEOUS) ×3
PACK ORTHO CERVICAL (CUSTOM PROCEDURE TRAY) ×3 IMPLANT
PAD ARMBOARD 7.5X6 YLW CONV (MISCELLANEOUS) ×6 IMPLANT
PATTIES SURGICAL .5 X.5 (GAUZE/BANDAGES/DRESSINGS) IMPLANT
PATTIES SURGICAL .5 X1 (DISPOSABLE) IMPLANT
PIN DISTRACTION 14 (PIN) ×6 IMPLANT
PLATE SKYLINE 3LVL 45MM CERV (Plate) ×3 IMPLANT
PUTTY BONE DBX 5CC MIX (Putty) ×3 IMPLANT
SCREW SKYLINE VAR OS 14MM (Screw) ×24 IMPLANT
SPONGE INTESTINAL PEANUT (DISPOSABLE) ×6 IMPLANT
SPONGE SURGIFOAM ABS GEL 100 (HEMOSTASIS) ×3 IMPLANT
STRIP CLOSURE SKIN 1/2X4 (GAUZE/BANDAGES/DRESSINGS) ×2 IMPLANT
SURGIFLO W/THROMBIN 8M KIT (HEMOSTASIS) IMPLANT
SUT MNCRL AB 4-0 PS2 18 (SUTURE) ×3 IMPLANT
SUT VIC AB 2-0 CT2 18 VCP726D (SUTURE) ×3 IMPLANT
SYR BULB IRRIGATION 50ML (SYRINGE) ×3 IMPLANT
SYR CONTROL 10ML LL (SYRINGE) ×6 IMPLANT
TAPE CLOTH 4X10 WHT NS (GAUZE/BANDAGES/DRESSINGS) ×3 IMPLANT
TAPE CLOTH SURG 4X10 WHT LF (GAUZE/BANDAGES/DRESSINGS) ×3 IMPLANT
TAPE UMBILICAL COTTON 1/8X30 (MISCELLANEOUS) ×3 IMPLANT
TOWEL OR 17X24 6PK STRL BLUE (TOWEL DISPOSABLE) ×3 IMPLANT
TOWEL OR 17X26 10 PK STRL BLUE (TOWEL DISPOSABLE) ×3 IMPLANT
TRAY FOLEY MTR SLVR 16FR STAT (SET/KITS/TRAYS/PACK) IMPLANT
WATER STERILE IRR 1000ML POUR (IV SOLUTION) ×3 IMPLANT
YANKAUER SUCT BULB TIP NO VENT (SUCTIONS) ×3 IMPLANT

## 2018-08-28 NOTE — Op Note (Signed)
NAME:  Alison [name]                MEDICAL RECORD NO.:  [id number]  PHYSICIAN:  Phylliss Bob, MD      DATE OF BIRTH:  1961-08-11  DATE OF PROCEDURE:  08/28/2018                              OPERATIVE REPORT   PREOPERATIVE DIAGNOSES: 1. Cervical myelopathy 2. Spinal stenosis spanning C4-C7.  POSTOPERATIVE DIAGNOSES: 1. Cervical myelopathy 2. Spinal stenosis spanning C4-C7.  PROCEDURE: 1. Anterior cervical decompression and fusion C4/5, C5/6, C6/7. 2. Placement of anterior instrumentation, C4-C7. 3. Insertion of interbody device x3 (Titan intervertebral spacers). 4. Intraoperative use of fluoroscopy. 5. Use of morselized allograft - DBX-mix  SURGEON:  Phylliss Bob, MD  ASSISTANT:  Loni Dolly- PA-C  ANESTHESIA:  General endotracheal anesthesia.  COMPLICATIONS:  None.  DISPOSITION:  Stable.  ESTIMATED BLOOD LOSS:  Minimal.  INDICATIONS FOR SURGERY:  Briefly, Ms. Tessier is a pleasant 57 year old female, who did present to me with onogoing symptoms related to cervical myelopathy. The patient's MRI did reveal the findings noted above.  Given the patient's ongoing rather debilitating pain and lack of improvement with appropriate treatment measures, we did discuss proceeding with the procedure noted above.  The patient was fully aware of the risks and limitations of surgery as outlined in my preoperative note.  OPERATIVE DETAILS:  On 08/28/2018, the patient was brought to surgery and general endotracheal anesthesia was administered.  The patient was placed supine on the hospital bed. The neck was gently extended.  All bony prominences were meticulously padded.  The neck was prepped and draped in the usual sterile fashion.  At this point, I did make a left-sided transverse incision.  The platysma was incised.  A Smith-Robinson approach was used and the anterior spine was identified. A self-retaining retractor was placed.  I then subperiosteally exposed the  vertebral bodies from C4-C7.  Caspar pins were then placed into the C6 and C7 vertebral bodies and distraction was applied.  A thorough and complete C6-7 intervertebral diskectomy was performed.  The posterior longitudinal ligament was identified and entered using a nerve hook.  I then used #1 followed by #2 Kerrison to perform a thorough and complete intervertebral diskectomy.  The spinal canal was thoroughly decompressed, as was the right and left neuroforamen.  The endplates were then prepared and the appropriate-sized intervertebral spacer was then packed with DBX-mix and tamped into position in the usual fashion.  The lower Caspar pin was then removed and placed into the C5 vertebral body and once again, distraction was applied across the C5-6 intervertebral space.  I then again performed a thorough and complete diskectomy, thoroughly decompressing the spinal canal and bilateral neuroforamena.  After preparing the endplates, the appropriate-sized intervertebral spacer was packed with DBX-mix and tamped into position.  The lower Caspar pin was then removed and placed into the C4 vertebral body and once again, distraction was applied across the C4-5 intervertebral space.  I then again performed a thorough and complete diskectomy, thoroughly decompressing the spinal canal and bilateral neuroforamena.  After preparing the endplates, the appropriate-sized intervertebral spacer was packed with DBX-mix and tamped into position.  The Caspar pins then were removed and bone wax was placed in their place.  The appropriate-sized anterior cervical plate was placed over the anterior spine.  14 mm variable angle screws were placed, 2 in each  vertebral body from C4-C7 for a total of 8 vertebral body screws.  The screws were then locked to the plate using the Cam locking mechanism.  I was very pleased with the final fluoroscopic images.  The wound was then irrigated.  The wound was then explored for  any undue bleeding and there was no bleeding noted. The wound was then closed in layers using 2-0 Vicryl, followed by 4-0 Monocryl.  Benzoin and Steri-Strips were applied, followed by sterile dressing.  All instrument counts were correct at the termination of the procedure.  Of note, Loni Dolly- PA-C, was my assistant throughout surgery, and did aid in retraction, suctioning, and closure from start to finish.     Phylliss Bob, MD

## 2018-08-28 NOTE — Anesthesia Preprocedure Evaluation (Signed)
Anesthesia Evaluation  Patient identified by MRN, date of birth, ID band Patient awake    Reviewed: Allergy & Precautions, NPO status , Patient's Chart, lab work & pertinent test results  Airway Mallampati: II  TM Distance: >3 FB Neck ROM: Full    Dental  (+) Dental Advisory Given, Teeth Intact   Pulmonary    breath sounds clear to auscultation       Cardiovascular hypertension,  Rhythm:Regular Rate:Normal     Neuro/Psych    GI/Hepatic   Endo/Other    Renal/GU      Musculoskeletal   Abdominal (+) + obese,   Peds  Hematology   Anesthesia Other Findings   Reproductive/Obstetrics                             Anesthesia Physical Anesthesia Plan  ASA: III  Anesthesia Plan: General   Post-op Pain Management:    Induction: Intravenous  PONV Risk Score and Plan: Ondansetron and Dexamethasone  Airway Management Planned: Oral ETT  Additional Equipment:   Intra-op Plan:   Post-operative Plan: Extubation in OR  Informed Consent: I have reviewed the patients History and Physical, chart, labs and discussed the procedure including the risks, benefits and alternatives for the proposed anesthesia with the patient or authorized representative who has indicated his/her understanding and acceptance.   Dental advisory given  Plan Discussed with: CRNA and Anesthesiologist  Anesthesia Plan Comments:         Anesthesia Quick Evaluation

## 2018-08-28 NOTE — Progress Notes (Signed)
C/o pain over l clavicle / has a 8cm x 3cm purple discolored/sensitive area mid clavicular  Area. Ranae Pila RN from OR asked to assess to determine if present/noticed after case.

## 2018-08-28 NOTE — Transfer of Care (Signed)
Immediate Anesthesia Transfer of Care Note  Patient: Alison [name]  Procedure(s) Performed: ANTERIOR CERVICAL DECOMPRESSION FUSION, CERVICAL 4-5, CERVICAL 5-6, CERVICAL 6-7 WITH INSTRUMENTATION AND ALLOGRAFT (N/A Neck)  Patient Location: PACU  Anesthesia Type:General  Level of Consciousness: drowsy and patient cooperative  Airway & Oxygen Therapy: Patient Spontanous Breathing and Patient connected to nasal cannula oxygen  Post-op Assessment: Report given to RN, Post -op Vital signs reviewed and stable and Patient moving all extremities  Post vital signs: Reviewed and stable  Last Vitals:  Vitals Value Taken Time  BP 144/80 08/28/2018  2:46 PM  Temp    Pulse 82 08/28/2018  2:46 PM  Resp 20 08/28/2018  2:46 PM  SpO2 98 % 08/28/2018  2:46 PM  Vitals shown include unvalidated device data.  Last Pain:  Vitals:   08/28/18 1009  TempSrc:   PainSc: 0-No pain      Patients Stated Pain Goal: 3 (83/35/82 5189)  Complications: No apparent anesthesia complications

## 2018-08-28 NOTE — Progress Notes (Signed)
Care of pt assumed by [facility]. Pt c/o discomfort bilateral clavicle areas, R > L.. Previous area of documented bruising noted on left. John RN from [place] here-he will let Dr Lynann Bologna know about this.

## 2018-08-28 NOTE — H&P (Signed)
PREOPERATIVE H&P  Chief Complaint: Bilateral hand numbness  HPI: Alison [name] is a 57 y.o. female who presents with ongoing symptoms c/w cervical myelopathy   MRI reveals spinal cord compression spanning C4-C7  Patient has failed multiple forms of conservative care and continues to have pain (see office notes for additional details regarding the patient's full course of treatment)  Past Medical History:  Diagnosis Date  . Anemia    prior to hysterectomy   . Anxiety   . Asthma   . Heart murmur   . Hx-TIA (transient ischemic attack) 1992  . Hyperlipidemia   . Hypertension   . Hypothyroidism   . Obesity   . Polycystic ovarian syndrome    Past Surgical History:  Procedure Laterality Date  . ABDOMINAL HYSTERECTOMY  2009   Partial  . FEMUR FRACTURE SURGERY  2008   Left  . I&D EXTREMITY Left 06/01/2018   Procedure: IRRIGATION AND DEBRIDEMENT LEFT CALF;  Surgeon: Mcarthur Rossetti, MD;  Location: [facility];  Service: Orthopedics;  Laterality: Left;  . KNEE CARTILAGE SURGERY  2005  . LAPAROSCOPIC GASTRIC BANDING  11/12/2011   Procedure: LAPAROSCOPIC GASTRIC BANDING;  Surgeon: Judieth Keens, DO;  Location: [facility];  Service: General;  Laterality: N/A;  nathanson liver retractor/ endostitch available 2-0 surgidac refills/ layton needle drivers Economist to Pensions consultant  . TONSILLECTOMY  2007   Social History   Socioeconomic History  . Marital status: Single    Spouse name: Not on file  . Number of children: Not on file  . Years of education: Not on file  . Highest education level: Not on file  Occupational History  . Not on file  Social Needs  . Financial resource strain: Not on file  . Food insecurity:    Worry: Not on file    Inability: Not on file  . Transportation needs:    Medical: Not on file    Non-medical: Not on file  Tobacco Use  . Smoking status: Never Smoker  . Smokeless tobacco: Never Used  Substance and Sexual Activity  . Alcohol  use: Yes    Comment: rare  . Drug use: No  . Sexual activity: Not on file  Lifestyle  . Physical activity:    Days per week: Not on file    Minutes per session: Not on file  . Stress: Not on file  Relationships  . Social connections:    Talks on phone: Not on file    Gets together: Not on file    Attends religious service: Not on file    Active member of club or organization: Not on file    Attends meetings of clubs or organizations: Not on file    Relationship status: Not on file  Other Topics Concern  . Not on file  Social History Narrative  . Not on file   Family History  Problem Relation Age of Onset  . Glaucoma Mother   . Heart disease Father   . Stroke Father   . Anesthesia problems Sister   . Migraines Sister   . Cancer Maternal Aunt        Breast Cancer  . Breast cancer Maternal Aunt 35  . Ovarian cancer Maternal Grandmother   . Uterine cancer Maternal Grandmother   . Myasthenia gravis Paternal Grandfather    Allergies  Allergen Reactions  . Aspirin Anaphylaxis  . Cherry Hives and Shortness Of Breath  . Other Hives and Itching  Walnuts and pecans - cause itching and hives in mouth  . Soy Allergy Other (See Comments)    " hives in mouth and wheezing"   . Penicillins Itching    Has patient had a PCN reaction causing immediate rash, facial/tongue/throat swelling, SOB or lightheadedness with hypotension: No Has patient had a PCN reaction causing severe rash involving mucus membranes or skin necrosis: No Has patient had a PCN reaction that required hospitalization: No Has patient had a PCN reaction occurring within the last 10 years: No If all of the above answers are "NO", then may proceed with Cephalosporin use.    Prior to Admission medications   Medication Sig Start Date End Date Taking? Authorizing Provider  acetaminophen (TYLENOL) 325 MG tablet Take 650 mg by mouth daily as needed for moderate pain or headache.   Yes [provider]    albuterol (PROVENTIL HFA;VENTOLIN HFA) 108 (90 BASE) MCG/ACT inhaler Inhale 2 puffs into the lungs every 6 (six) hours as needed. For asthma   Yes [provider]  ALPRAZolam (XANAX) 0.25 MG tablet Take 0.25 mg by mouth daily as needed for anxiety or sleep.    Yes [provider]  amLODipine (NORVASC) 5 MG tablet Take 5 mg by mouth every morning.    Yes [provider]  cetirizine (ZYRTEC) 10 MG tablet Take 10 mg by mouth at bedtime as needed (itching).   Yes [provider]  diphenhydrAMINE (BENADRYL) 2 % cream Apply 1 application topically daily as needed for itching.   Yes [provider]  escitalopram (LEXAPRO) 10 MG tablet Take 10 mg by mouth daily.   Yes [provider]  hydrochlorothiazide 25 MG tablet Take 25 mg by mouth every other day.    Yes [provider]  levothyroxine (SYNTHROID, LEVOTHROID) 112 MCG tablet Take 112 mcg by mouth every morning.    Yes [provider]  omeprazole (PRILOSEC) 20 MG capsule Take 1 capsule (20 mg total) by mouth daily. Patient taking differently: Take 20 mg by mouth daily as needed (acid reflux).  06/24/18  Yes Leamon Arnt, MD  rosuvastatin (CRESTOR) 40 MG tablet Take 40 mg by mouth every evening.    Yes [provider]     All other systems have been reviewed and were otherwise negative with the exception of those mentioned in the HPI and as above.  Physical Exam: There were no vitals filed for this visit.  There is no height or weight on file to calculate BMI.  General: Alert, no acute distress Cardiovascular: No pedal edema Respiratory: No cyanosis, no use of accessory musculature Skin: No lesions in the area of chief complaint Neurologic: Sensation intact distally Psychiatric: Patient is competent for consent with normal mood and affect Lymphatic: No axillary or cervical lymphadenopathy   Assessment/Plan: CERVICAL MYELOPATHY Plan for  Procedure(s): ANTERIOR CERVICAL DECOMPRESSION FUSION, CERVICAL 4-5, CERVICAL 5-6, CERVICAL 6-7 WITH INSTRUMENTATION AND ALLOGRAFT   Sinclair Ship, MD 08/28/2018 6:29 AM

## 2018-08-28 NOTE — Anesthesia Postprocedure Evaluation (Signed)
Anesthesia Post Note  Patient: Alison [name]  Procedure(s) Performed: ANTERIOR CERVICAL DECOMPRESSION FUSION, CERVICAL 4-5, CERVICAL 5-6, CERVICAL 6-7 WITH INSTRUMENTATION AND ALLOGRAFT (N/A Neck)     Patient location during evaluation: PACU Anesthesia Type: General Level of consciousness: awake and alert Pain management: pain level controlled Vital Signs Assessment: post-procedure vital signs reviewed and stable Respiratory status: spontaneous breathing, nonlabored ventilation, respiratory function stable and patient connected to nasal cannula oxygen Cardiovascular status: blood pressure returned to baseline and stable Postop Assessment: no apparent nausea or vomiting Anesthetic complications: no    Last Vitals:  Vitals:   08/28/18 1500 08/28/18 1513  BP: 128/89   Pulse: 82 82  Resp: (!) 22 (!) 23  Temp:    SpO2: 94% 96%    Last Pain:  Vitals:   08/28/18 1513  TempSrc:   PainSc: 5       LLE Sensation: Full sensation (08/28/18 1513)   RLE Sensation: Full sensation (08/28/18 1513)      [name]

## 2018-08-28 NOTE — Anesthesia Procedure Notes (Signed)
Procedure Name: Intubation Date/Time: 08/28/2018 11:30 AM Performed by: Leonor Liv, CRNA Pre-anesthesia Checklist: Patient identified, Emergency Drugs available, Suction available and Patient being monitored Patient Re-evaluated:Patient Re-evaluated prior to induction Oxygen Delivery Method: Circle System Utilized Preoxygenation: Pre-oxygenation with 100% oxygen Induction Type: IV induction Ventilation: Mask ventilation without difficulty Laryngoscope Size: Mac and 3 Grade View: Grade II Tube type: Oral Tube size: 7.0 mm Number of attempts: 2 (DLx1 grade III, mask vent with sevo, DLx1 grade II ) Airway Equipment and Method: Stylet and Oral airway Placement Confirmation: ETT inserted through vocal cords under direct vision,  positive ETCO2 and breath sounds checked- equal and bilateral Secured at: 21 cm Tube secured with: Tape Dental Injury: Teeth and Oropharynx as per pre-operative assessment  Difficulty Due To: Difficult Airway- due to anterior larynx Comments: Slightly anterior airway and large body habitus. Intubation successful with grade II view.

## 2018-08-29 DIAGNOSIS — M4802 Spinal stenosis, cervical region: Secondary | ICD-10-CM | POA: Diagnosis not present

## 2018-08-29 MED FILL — Thrombin (Recombinant) For Soln 20000 Unit: CUTANEOUS | Qty: 1 | Status: AC

## 2018-08-29 NOTE — Progress Notes (Signed)
    Patient doing well  Denies arm pain Tolerating PO well   Physical Exam: Vitals:   08/28/18 2317 08/29/18 0357  BP:  (!) 101/55  Pulse:  65  Resp:  20  Temp: 98.4 F (36.9 C) 97.6 F (36.4 C)  SpO2:  97%    Neck soft/supple Dressing in place NVI  POD #1 s/p C4-7 ACDF, doing well  - encourage ambulation - Percocet for pain, Valium for muscle spasms - d/c home today with f/u in 2 weeks

## 2018-08-29 NOTE — Evaluation (Signed)
Physical Therapy Evaluation Patient Details Name: Alison [name] MRN: [id number] DOB: 1961/07/30 Today's Date: 08/29/2018   History of Present Illness  Pt is a 57 y/o female s/p ACDF C4-7 due to cervical myelopathy failing conservative measures. PMH significant for but not limited to: anemia, anxiety, TIA, THN, L femur fx (2008).  Clinical Impression  Patient evaluated by Physical Therapy with no further acute PT needs identified. Ambulating independently with no assistive device and able to negotiate 10 steps using left railing to simulate home environment. Good technique utilized of "feeling" for step rather than looking down. All education has been completed and the patient has no further questions.No follow-up Physical Therapy or equipment needs. PT is signing off. Thank you for this referral.     Follow Up Recommendations No PT follow up    Equipment Recommendations  None recommended by PT    Recommendations for Other Services       Precautions / Restrictions Precautions Precautions: Cervical Precaution Booklet Issued: Yes (comment) Precaution Comments: reviewed precautions with patinet  Required Braces or Orthoses: Cervical Brace Cervical Brace: Hard collar;At all times Restrictions Weight Bearing Restrictions: No      Mobility  Bed Mobility Overal bed mobility: Modified Independent             General bed mobility comments: OOB upon entry  Transfers Overall transfer level: Independent Equipment used: None Transfers: Sit to/from Stand Sit to Stand: Supervision         General transfer comment: supervision for safety  Ambulation/Gait Ambulation/Gait assistance: Independent Gait Distance (Feet): 350 Feet Assistive device: None Gait Pattern/deviations: WFL(Within Functional Limits)   Gait velocity interpretation: >2.62 ft/sec, indicative of community ambulatory General Gait Details: Independent with ambulation demonstrating good posture and gait  speed  Stairs Stairs: Yes Stairs assistance: Supervision Stair Management: One rail Left Number of Stairs: 10 General stair comments: step by step pattern with cues for "feeling" for step rather than looking down  Wheelchair Mobility    Modified Rankin (Stroke Patients Only)       Balance Overall balance assessment: No apparent balance deficits (not formally assessed)                                           Pertinent Vitals/Pain Pain Assessment: No/denies pain    Home Living Family/patient expects to be discharged to:: Private residence Living Arrangements: Parent;Children Available Help at Discharge: Family;Available 24 hours/day Type of Home: House Home Access: Stairs to enter   [employer] of Steps: 2 Home Layout: Two level;Able to live on main level with bedroom/bathroom Home Equipment: None      Prior Function Level of Independence: Independent         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, newspaper   Dominant Hand: Right    Extremity/Trunk Assessment   Upper Extremity Assessment Upper Extremity Assessment: Defer to OT evaluation;RUE deficits/detail RUE Deficits / Details: WFL within precautions,  RUE Sensation: decreased light touch(improving numbness in hand ) RUE Coordination: decreased fine motor LUE Deficits / Details: WFL within precautions  LUE Sensation: WNL LUE Coordination: WNL    Lower Extremity Assessment Lower Extremity Assessment: Overall WFL for tasks assessed    Cervical / Trunk Assessment Cervical / Trunk Assessment: Other exceptions Cervical / Trunk Exceptions: s/p ACDF  Communication   Communication: No difficulties  Cognition Arousal/Alertness: Awake/alert  Behavior During Therapy: WFL for tasks assessed/performed Overall Cognitive Status: Within Functional Limits for tasks assessed                                        General Comments       Exercises     Assessment/Plan    PT Assessment Patent does not need any further PT services  PT Problem List         PT Treatment Interventions      PT Goals (Current goals can be found in the Care Plan section)  Acute Rehab PT Goals Patient Stated Goal: home today PT Goal Formulation: All assessment and education complete, DC therapy    Frequency     Barriers to discharge        Co-evaluation               AM-PAC PT "6 Clicks" Daily Activity  Outcome Measure Difficulty turning over in bed (including adjusting bedclothes, sheets and blankets)?: None Difficulty moving from lying on back to sitting on the side of the bed? : None Difficulty sitting down on and standing up from a chair with arms (e.g., wheelchair, bedside commode, etc,.)?: None Help needed moving to and from a bed to chair (including a wheelchair)?: None Help needed walking in hospital room?: None Help needed climbing 3-5 steps with a railing? : A Little 6 Click Score: 23    End of Session Equipment Utilized During Treatment: Cervical collar;Gait belt Activity Tolerance: Patient tolerated treatment well Patient left: Other (comment)(in room) Nurse Communication: Mobility status PT Visit Diagnosis: Difficulty in walking, not elsewhere classified (R26.2);Pain Pain - part of body: (cervical)    Time: 0998-3382 PT Time Calculation (min) (ACUTE ONLY): 9 min   Charges:   PT Evaluation $PT Eval Low Complexity: 1 Low         Ellamae Sia, PT, DPT Acute Rehabilitation Services Pager [phone] Office [phone]   Willy Eddy 08/29/2018, 10:11 AM

## 2018-08-29 NOTE — Progress Notes (Signed)
Orthopedic Tech Progress Note Patient Details:  Alison [name] April 01, 1961 [id number]  Ortho Devices Type of Ortho Device: [place] cervical collar Ortho Device/Splint Interventions: Ordered  viewed order for [place] cervical collar from RN order list     Hildred Priest 08/29/2018, 7:44 AM

## 2018-08-29 NOTE — Evaluation (Addendum)
Occupational Therapy Evaluation and Discharge  Patient Details Name: Alison [name] MRN: [id number] DOB: 1961/03/04 Today's Date: 08/29/2018    History of Present Illness Pt is a 57 y/o female s/p ACDF C4-7 due to cervical myelopathy failing conservative measures. PMH significant for but not limited to: anemia, anxiety, TIA, THN, L femur fx (2008).   Clinical Impression   PTA patient independent and working. Currently requires setup to supervision for ADLs and transfers, provided education on brace management/wear schedule, precautions, safety, mobility, ADL compensatory techniques, and DME/recommendations.  Patient presents with improving of numbness in R UE, continues to have generalized weakness bilaterally but improving.  She follow precautions without cueing after education on compensatory techniques.  Plans to purchase tub bench for tub transfers.  No further OT needs identified at this time.      Follow Up Recommendations  No OT follow up;Supervision - Intermittent    Equipment Recommendations  Tub/shower bench(plans to self purchase)    Recommendations for Other Services PT consult     Precautions / Restrictions Precautions Precautions: Cervical Precaution Booklet Issued: Yes (comment) Precaution Comments: reviewed precautions with patinet  Required Braces or Orthoses: Cervical Brace Cervical Brace: Hard collar;At all times Restrictions Weight Bearing Restrictions: No      Mobility Bed Mobility Overal bed mobility: Modified Independent             General bed mobility comments: educated on log rolling technique, but transitioned to EOB from long sitting   Transfers Overall transfer level: Needs assistance Equipment used: None Transfers: Sit to/from Stand Sit to Stand: Supervision         General transfer comment: supervision for safety    Balance Overall balance assessment: Mild deficits observed, not formally tested                                          ADL either performed or assessed with clinical judgement   ADL Overall ADL's : Needs assistance/impaired Eating/Feeding: Modified independent;Sitting   Grooming: Modified independent;Standing Grooming Details (indicate cue type and reason): reviewed compensatory techniques for grooming and precautions  Upper Body Bathing: Supervision/ safety;Sitting;Set up   Lower Body Bathing: Set up;Supervison/ safety;Sit to/from stand;Cueing for compensatory techniques;Cueing for back precautions   Upper Body Dressing : Supervision/safety;Standing   Lower Body Dressing: Supervision/safety;Sit to/from stand;Cueing for compensatory techniques   Toilet Transfer: Set up;Supervision/safety;Ambulation;Grab bars;Regular Museum/gallery exhibitions officer and Hygiene: Supervision/safety;Sit to/from Nurse, children's Details (indicate cue type and reason): plans to purchase tub transfer bench Functional mobility during ADLs: Modified independent General ADL Comments: Patient educated on modified techniques and compensatory techniques for ADLs due to cervical precautions. Good adherance after education with techniques      Vision Baseline Vision/History: Wears glasses Wears Glasses: Reading only Patient Visual Report: No change from baseline Vision Assessment?: No apparent visual deficits     Perception     Praxis      Pertinent Vitals/Pain Pain Assessment: No/denies pain     Hand Dominance Right   Extremity/Trunk Assessment Upper Extremity Assessment Upper Extremity Assessment: Generalized weakness;RUE deficits/detail;LUE deficits/detail RUE Deficits / Details: WFL within precautions,  RUE Sensation: decreased light touch(improving numbness in hand ) RUE Coordination: decreased fine motor LUE Deficits / Details: WFL within precautions  LUE Sensation: WNL LUE Coordination: WNL   Lower Extremity Assessment Lower Extremity Assessment: Defer to PT  evaluation   Cervical / Trunk Assessment Cervical / Trunk Assessment: Other exceptions Cervical / Trunk Exceptions: s/p ACDF   Communication Communication Communication: No difficulties   Cognition Arousal/Alertness: Awake/alert Behavior During Therapy: WFL for tasks assessed/performed Overall Cognitive Status: Within Functional Limits for tasks assessed                                     General Comments       Exercises     Shoulder Instructions      Home Living Family/patient expects to be discharged to:: Private residence Living Arrangements: Parent;Children Available Help at Discharge: Family;Available 24 hours/day Type of Home: House Home Access: Stairs to enter [employer] of Steps: 2   Home Layout: Two level;Able to live on main level with bedroom/bathroom Alternate Level Stairs-Number of Steps: flight   Bathroom Shower/Tub: Teacher, early years/pre: Standard     Home Equipment: None          Prior Functioning/Environment Level of Independence: Independent        Comments: works as a Surveyor, minerals work)        OT Problem List: Decreased strength;Decreased coordination;Decreased knowledge of precautions;Impaired sensation      OT Treatment/Interventions:      OT Goals(Current goals can be found in the care plan section) Acute Rehab OT Goals Patient Stated Goal: home today OT Goal Formulation: With patient  OT Frequency:     Barriers to D/C:            Co-evaluation              AM-PAC PT "6 Clicks" Daily Activity     Outcome Measure Help from another person eating meals?: None Help from another person taking care of personal grooming?: None Help from another person toileting, which includes using toliet, bedpan, or urinal?: None Help from another person bathing (including washing, rinsing, drying)?: None Help from another person to put on and taking off regular upper body clothing?:  None Help from another person to put on and taking off regular lower body clothing?: None 6 Click Score: 24   End of Session Equipment Utilized During Treatment: Cervical collar Nurse Communication: Mobility status  Activity Tolerance: Patient tolerated treatment well Patient left: with call bell/phone within reach(handoff to PT )  OT Visit Diagnosis: Muscle weakness (generalized) (M62.81)                Time: 1610-9604 OT Time Calculation (min): 20 min Charges:  OT General Charges $OT Visit: 1 Visit OT Evaluation $OT Eval Low Complexity: 1 Low  Delight Stare, OT Acute Rehabilitation Services Pager [phone] Office [phone]   Delight Stare 08/29/2018, 8:16 AM

## 2018-08-29 NOTE — Progress Notes (Signed)
Patient is discharged from room 3C08 at this time. Alert and in stable condition. IV site d/c'd and instructions read to patient with understanding verbalized. Left unit via wheelchair with all belongings at side. 

## 2018-09-01 ENCOUNTER — Encounter (HOSPITAL_COMMUNITY): Payer: Self-pay | Admitting: Orthopedic Surgery

## 2018-09-18 ENCOUNTER — Other Ambulatory Visit: Payer: Self-pay | Admitting: Family Medicine

## 2018-09-24 ENCOUNTER — Ambulatory Visit (INDEPENDENT_AMBULATORY_CARE_PROVIDER_SITE_OTHER): Payer: 59 | Admitting: Family Medicine

## 2018-09-24 ENCOUNTER — Other Ambulatory Visit: Payer: Self-pay

## 2018-09-24 ENCOUNTER — Encounter: Payer: Self-pay | Admitting: Family Medicine

## 2018-09-24 ENCOUNTER — Other Ambulatory Visit: Payer: Self-pay | Admitting: Family Medicine

## 2018-09-24 VITALS — BP 130/84 | HR 75 | Temp 98.0°F | Ht 62.0 in | Wt 229.4 lb

## 2018-09-24 DIAGNOSIS — Z6841 Body Mass Index (BMI) 40.0 and over, adult: Secondary | ICD-10-CM | POA: Diagnosis not present

## 2018-09-24 DIAGNOSIS — F411 Generalized anxiety disorder: Secondary | ICD-10-CM | POA: Diagnosis not present

## 2018-09-24 DIAGNOSIS — Z8673 Personal history of transient ischemic attack (TIA), and cerebral infarction without residual deficits: Secondary | ICD-10-CM

## 2018-09-24 DIAGNOSIS — E782 Mixed hyperlipidemia: Secondary | ICD-10-CM

## 2018-09-24 DIAGNOSIS — M170 Bilateral primary osteoarthritis of knee: Secondary | ICD-10-CM | POA: Diagnosis not present

## 2018-09-24 DIAGNOSIS — Z23 Encounter for immunization: Secondary | ICD-10-CM

## 2018-09-24 DIAGNOSIS — E038 Other specified hypothyroidism: Secondary | ICD-10-CM | POA: Diagnosis not present

## 2018-09-24 DIAGNOSIS — Z1231 Encounter for screening mammogram for malignant neoplasm of breast: Secondary | ICD-10-CM

## 2018-09-24 DIAGNOSIS — Z9884 Bariatric surgery status: Secondary | ICD-10-CM

## 2018-09-24 DIAGNOSIS — Z0000 Encounter for general adult medical examination without abnormal findings: Secondary | ICD-10-CM

## 2018-09-24 DIAGNOSIS — Z981 Arthrodesis status: Secondary | ICD-10-CM

## 2018-09-24 DIAGNOSIS — E890 Postprocedural hypothyroidism: Secondary | ICD-10-CM

## 2018-09-24 DIAGNOSIS — I10 Essential (primary) hypertension: Secondary | ICD-10-CM

## 2018-09-24 LAB — COMPREHENSIVE METABOLIC PANEL
ALT: 12 U/L (ref 0–35)
AST: 13 U/L (ref 0–37)
Albumin: 4 g/dL (ref 3.5–5.2)
Alkaline Phosphatase: 101 U/L (ref 39–117)
BUN: 11 mg/dL (ref 6–23)
CO2: 28 mEq/L (ref 19–32)
Calcium: 9.7 mg/dL (ref 8.4–10.5)
Chloride: 103 mEq/L (ref 96–112)
Creatinine, Ser: 0.72 mg/dL (ref 0.40–1.20)
GFR: 107.55 mL/min (ref >60.00)
Glucose, Bld: 89 mg/dL (ref 70–99)
Potassium: 3.7 mEq/L (ref 3.5–5.1)
Sodium: 140 mEq/L (ref 135–145)
Total Bilirubin: 0.5 mg/dL (ref 0.2–1.2)
Total Protein: 6.7 g/dL (ref 6.0–8.3)

## 2018-09-24 LAB — TSH: TSH: 13.03 u[IU]/mL — ABNORMAL HIGH (ref 0.35–4.50)

## 2018-09-24 LAB — CBC WITH DIFFERENTIAL/PLATELET
Basophils Absolute: 0.1 10*3/uL (ref 0.0–0.1)
Basophils Relative: 0.7 % (ref 0.0–3.0)
Eosinophils Absolute: 0.4 10*3/uL (ref 0.0–0.7)
Eosinophils Relative: 4.6 % (ref 0.0–5.0)
HCT: 44.5 % (ref 36.0–46.0)
Hemoglobin: 15 g/dL (ref 12.0–15.0)
Lymphocytes Relative: 39.4 % (ref 12.0–46.0)
Lymphs Abs: 3.3 10*3/uL (ref 0.7–4.0)
MCHC: 33.8 g/dL (ref 30.0–36.0)
MCV: 90.2 fl (ref 78.0–100.0)
Monocytes Absolute: 0.5 10*3/uL (ref 0.1–1.0)
Monocytes Relative: 5.7 % (ref 3.0–12.0)
Neutro Abs: 4.2 10*3/uL (ref 1.4–7.7)
Neutrophils Relative %: 49.6 % (ref 43.0–77.0)
Platelets: 249 10*3/uL (ref 150.0–400.0)
RBC: 4.94 Mil/uL (ref 3.87–5.11)
RDW: 13.5 % (ref 11.5–15.5)
WBC: 8.4 10*3/uL (ref 4.0–10.5)

## 2018-09-24 LAB — LIPID PANEL
Cholesterol: 286 mg/dL — ABNORMAL HIGH (ref 0–200)
HDL: 59.7 mg/dL (ref >39.00)
LDL Cholesterol: 207 mg/dL — ABNORMAL HIGH (ref 0–99)
NonHDL: 225.85
Total CHOL/HDL Ratio: 5
Triglycerides: 94 mg/dL (ref 0.0–149.0)
VLDL: 18.8 mg/dL (ref 0.0–40.0)

## 2018-09-24 LAB — H. PYLORI ANTIBODY, IGG: H Pylori IgG: NEGATIVE

## 2018-09-24 MED ORDER — ALBUTEROL SULFATE HFA 108 (90 BASE) MCG/ACT IN AERS: 2.0000 | INHALATION_SPRAY | Freq: Four times a day (QID) | RESPIRATORY_TRACT | 11 refills | Status: DC | PRN

## 2018-09-24 MED ORDER — ALPRAZOLAM 0.25 MG PO TABS: 0.2500 mg | ORAL_TABLET | Freq: Every evening | ORAL | 0 refills | Status: DC | PRN

## 2018-09-24 MED ORDER — PANTOPRAZOLE SODIUM 20 MG PO TBEC: 20.0000 mg | DELAYED_RELEASE_TABLET | Freq: Two times a day (BID) | ORAL | 2 refills | Status: DC

## 2018-09-24 NOTE — Patient Instructions (Addendum)
Please return in 6 months for follow up of your hypertension.  Consider returning for care of your knee arthritis.   If you have any questions or concerns, please don't hesitate to send me a message via MyChart or call the office at [phone]. Thank you for visiting with [place] today! It's our pleasure caring for you.  Recommendations for women to keep healthy:   EXERCISE AND DIET: We recommended that you start or continue a regular exercise program for good health. Regular exercise means any activity that makes your heart beat faster and makes you sweat. We recommend exercising at least 30 minutes per day at least 3 days a week, preferably 4 or 5. We also recommend a diet low in fat and sugar. Inactivity, poor dietary choices and obesity can cause diabetes, heart attack, stroke, and kidney damage, among others.   ALCOHOL AND SMOKING: Women should limit their alcohol intake to no more than 7 drinks/beers/glasses of wine (combined, not each!) per week. Moderation of alcohol intake to this level decreases your risk of breast cancer and liver damage. And of course, no recreational drugs are part of a healthy lifestyle. And absolutely no smoking or even second hand smoke. Most people know smoking can cause heart and lung diseases, but did you know it also contributes to weakening of your bones? Aging of your skin? Yellowing of your teeth and nails?  CALCIUM AND VITAMIN D: Adequate intake of calcium and Vitamin D are recommended. The recommendations for exact amounts of these supplements seem to change often, but generally speaking 600 mg of calcium (either carbonate or citrate) and 800 units of Vitamin D per day seems prudent. Certain women may benefit from higher intake of Vitamin D. If you are among these women, your doctor will have told you during your visit.   PAP SMEARS: Pap smears, to check for cervical cancer or precancers, have traditionally been done yearly, although recent scientific advances  have shown that most women can have pap smears less often. However, every woman still should have a physical exam from her gynecologist every year. It will include a breast check, inspection of the vulva and vagina to check for abnormal growths or skin changes, a visual exam of the cervix, and then an exam to evaluate the size and shape of the uterus and ovaries. And after 56 years of age, a rectal exam is indicated to check for rectal cancers. We will also provide age appropriate advice regarding health maintenance, like when you should have certain vaccines, screening for sexually transmitted diseases, bone density testing, colonoscopy, mammograms, etc.   MAMMOGRAMS: All women over 70 years old should have a yearly mammogram. Many facilities now offer a "3D" mammogram, which may cost around $50 extra out of pocket. If possible, we recommend you accept the option to have the 3D mammogram performed. It both reduces the number of women who will be called back for extra views which then turn out to be normal, and it is better than the routine mammogram at detecting truly abnormal areas.   COLONOSCOPY: Colonoscopy to screen for colon cancer is recommended for all women at age 75. We know, you hate the idea of the prep. We agree, BUT, having colon cancer and not knowing it is worse!! Colon cancer so often starts as a polyp that can be seen and removed at colonscopy, which can quite literally save your life! And if your first colonoscopy is normal and you have no family history of colon cancer, most  women don't have to have it again for 10 years. Once every ten years, you can do something that may end up saving your life, right? We will be happy to help you get it scheduled when you are ready. Be sure to check your insurance coverage so you understand how much it will cost. It may be covered as a preventative service at no cost, but you should check your particular policy.

## 2018-09-24 NOTE — Progress Notes (Signed)
Subjective  Chief Complaint  Patient presents with  . Annual Exam    doing well, no complaints. Wants flu shot today     HPI: Alison [name] is a 57 y.o. female who presents to [facility] today for a Female Wellness Visit. She also has the concerns and/or needs as listed above in the chief complaint. These will be addressed in addition to the Health Maintenance Visit.   Wellness Visit: annual visit with health maintenance review and exam without Pap   57 year old female here for complete physical today without Pap smear.  Has mammogram scheduled for next week.  Recovering from anterior cervical fusion about 3 weeks ago.  Overall things have gone well.  Cervical myelopathy with right hand numbness is mildly improving.  No complications.  Exercises thus been limited.  Weight is stable.  Working on eating healthy diet.  Flu shot today Chronic disease f/u and/or acute problem visit: (deemed necessary to be done in addition to the wellness visit):  Hypertension: Reports low blood pressures over the last 3 weeks at her doctor's visits.  Running normal today.  Tolerating her medications.  Due for lab work  Hyperlipidemia with goal less than 70 due to a remote history of TIA that was related to birth control pills.  On Crestor.  No myalgias.Patient is fasting today for blood work.   Hypothyroidism on Synthroid.  Compliance is good.  Energy levels are normal.  No symptoms of high or low thyroid.  Will need refill  GERD: Started PPI in July, however symptoms have progressed.  Having nightly symptoms.  Status post gastric banding with most recent evaluation showing chronic reflux.  She is considering having the band removed.  No melena or hematemesis.  No weight loss.  General anxiety is well controlled on Lexapro.  Requests Xanax refill.  Last refill in 2016.  Uses rarely for sleep onset  Bilateral knee pain with exercise or running.  She prefers running for  exercise.  Has been seen by Ortho in the past.  Diagnosed with mild osteoarthritis by x-rays and clinical exam.  No redness or swelling.  No trauma.  Steroid injection in the past with good relief.  Has never had Synvisc.  Assessment  1. Annual physical exam   2. Body mass index (BMI) of 40.0 to 44.9 in adult ([place])   3. Essential hypertension with goal blood pressure less than 140/90   4. GAD (generalized anxiety disorder)   5. Mixed hyperlipidemia   6. History of TIA (transient ischemic attack)   7. Post-therapeutic hypothyroidism   8. Gastric banding status      Plan  Female Wellness Visit:  Age appropriate Health Maintenance and Prevention measures were discussed with patient. Included topics are cancer screening recommendations, ways to keep healthy (see AVS) including dietary and exercise recommendations, regular eye and dental care, use of seat belts, and avoidance of moderate alcohol use and tobacco use.  For mammogram next week.  BMI: discussed patient's BMI and encouraged positive lifestyle modifications to help get to or maintain a target BMI.  Discussed diet with decreased processed food and increasing veggies and grains  HM needs and immunizations were addressed and ordered. See below for orders. See HM and immunization section for updates.  Flu shot today  Routine labs and screening tests ordered including cmp, cbc and lipids where appropriate.  Discussed recommendations regarding Vit D and calcium supplementation (see AVS)  Chronic disease management visit and/or acute problem visit:  Hypertension follow-up: Well-controlled.  Continue current medications and check renal function  Hyperlipidemia and hypothyroidism: Recheck lab work today.  Both clinically well controlled.  Decrease fat and cholesterol in diet  GERD, worsening: Change to twice daily Protonix.  Follow-up with bariatric surgeon.  Check H. Pylori.  Osteoarthritis of knees: Education given.  Glucosamine  Tylenol as needed.  Consider steroid injection or Synvisc in the future if needed.  Status post cervical spinal fusion recovering well Follow up: Return in about 6 months (around 03/26/2019) for follow up Hypertension.  Orders Placed This Encounter  Procedures  . CBC with Differential/Platelet  . Comprehensive metabolic panel  . TSH  . Lipid panel  . POCT urinalysis dipstick   No orders of the defined types were placed in this encounter.     Lifestyle: Body mass index is 41.96 kg/m. Wt Readings from Last 3 Encounters:  09/24/18 229 lb 6.4 oz (104.1 kg)  08/28/18 231 lb (104.8 kg)  08/19/18 231 lb (104.8 kg)   Diet: general  Patient Active Problem List   Diagnosis Date Noted  . GAD (generalized anxiety disorder) 06/24/2018    Priority: High    Overview:  Did well on paxil but stopped due to weight gain; wellbutrin - failed after 6 months. stabilized on lexapro.   . Mixed hyperlipidemia 06/24/2018    Priority: High  . Essential hypertension with goal blood pressure less than 140/90 05/31/2018    Priority: High    Overview:  Nl Echo 02/2011   . Body mass index (BMI) of 40.0 to 44.9 in adult [facility]) 02/24/2018    Priority: High  . Family history of premature CAD 12/29/2013    Priority: High    Overview:  Father, 42 yo   . Post-therapeutic hypothyroidism 12/29/2013    Priority: High    Overview:  RAI    . History of TIA (transient ischemic attack) 12/10/1980    Priority: High    When on OCPs   . Mild intermittent asthma without complication 84/13/2440    Priority: Medium    Overview:  Negative sleep study, 04/2011   . Primary osteoarthritis of both knees 09/11/2016    Priority: Medium    Overview:  Dr. Noemi Chapel   . Gastric banding status 10/07/2015    Priority: Medium  . Restless legs syndrome (RLS) 02/14/2015    Priority: Medium  . Insomnia 12/29/2013    Priority: Medium    Overview:  Failed lunesta and ambien and trazadone; Uses xanax rarely   .  Cervical myelopathy ([place]) 08/28/2018   Health Maintenance  Topic Date Due  . MAMMOGRAM  03/22/2018  . INFLUENZA VACCINE  07/10/2018  . PAP SMEAR  12/10/2018  . TETANUS/TDAP  12/10/2021  . COLONOSCOPY  10/24/2025  . Hepatitis C Screening  Completed  . HIV Screening  Completed   Immunization History  Administered Date(s) Administered  . Influenza, Seasonal, Injecte, Preservative Fre 08/20/2014, 09/12/2015  . Influenza,inj,Quad PF,6+ Mos 09/11/2016, 09/25/2017  . Pneumococcal Polysaccharide-23 12/10/2012  . Tdap 12/11/2011   We updated and reviewed the patient's past history in detail and it is documented below. Allergies: Patient is allergic to aspirin; cherry; other; soy allergy; and penicillins. Past Medical History Patient  has a past medical history of Anemia, Anxiety, Asthma, Heart murmur, TIA (transient ischemic attack) (1992), Hyperlipidemia, Hypertension, Hypothyroidism, Obesity, and Polycystic ovarian syndrome. Past Surgical History Patient  has a past surgical history that includes Knee cartilage surgery (2005); Abdominal hysterectomy (2009); Tonsillectomy (2007); Femur fracture surgery (2008);  Laparoscopic gastric banding (11/12/2011); I&D extremity (Left, 06/01/2018); and Anterior cervical decompression/discectomy fusion 4 level (N/A, 08/28/2018). Family History: Patient family history includes Anesthesia problems in her sister; Breast cancer (age of onset: 24) in her maternal aunt; Cancer in her maternal aunt; Glaucoma in her mother; Heart disease in her father; Migraines in her sister; Myasthenia gravis in her paternal grandfather; Ovarian cancer in her maternal grandmother; Stroke in her father; Uterine cancer in her maternal grandmother. Social History:  Patient  reports that she has never smoked. She has never used smokeless tobacco. She reports that she drinks alcohol. She reports that she does not use drugs.  Review of Systems: Constitutional: negative for fever or  malaise Ophthalmic: negative for photophobia, double vision or loss of vision Cardiovascular: negative for chest pain, dyspnea on exertion, or new LE swelling Respiratory: negative for SOB or persistent cough Gastrointestinal: negative for abdominal pain, change in bowel habits or melena Genitourinary: negative for dysuria or gross hematuria, no abnormal uterine bleeding or disharge Musculoskeletal: negative for new gait disturbance or muscular weakness Integumentary: negative for new or persistent rashes, no breast lumps Neurological: negative for TIA or stroke symptoms Psychiatric: negative for SI or delusions Allergic/Immunologic: negative for hives  Patient Care Team    Relationship Specialty Notifications Start End  Leamon Arnt, MD PCP - General Family Medicine  06/24/18     Objective  Vitals: BP 130/84   Pulse 75   Temp 98 F (36.7 C)   Ht 5\' 2"  (1.575 m)   Wt 229 lb 6.4 oz (104.1 kg)   SpO2 96%   BMI 41.96 kg/m  General:  Well developed, well nourished, no acute distress, wearing soft cervical collar Psych:  Alert and orientedx3,normal mood and affect HEENT:  Normocephalic, atraumatic, non-icteric sclera, PERRL, oropharynx is clear without mass or exudate, supple neck without adenopathy, mass or thyromegaly Cardiovascular:  Normal S1, S2, RRR without gallop, rub or murmur, nondisplaced PMI Respiratory:  Good breath sounds bilaterally, CTAB with normal respiratory effort Gastrointestinal: normal bowel sounds, soft, non-tender, no noted masses. No HSM MSK: no deformities, contusions. Joints are without erythema or swelling. Spine and CVA region are nontender Skin:  Warm, no rashes or suspicious lesions noted Neurologic:    Mental status is normal. CN 2-11 are normal. Gross motor and sensory exams are normal. Normal gait. No tremor Breast Exam: No mass, skin retraction or nipple discharge is appreciated in either breast. No axillary adenopathy. Fibrocystic changes are not  noted    Commons side effects, risks, benefits, and alternatives for medications and treatment plan prescribed today were discussed, and the patient expressed understanding of the given instructions. Patient is instructed to call or message via MyChart if he/she has any questions or concerns regarding our treatment plan. No barriers to understanding were identified. We discussed Red Flag symptoms and signs in detail. Patient expressed understanding regarding what to do in case of urgent or emergency type symptoms.   Medication list was reconciled, printed and provided to the patient in AVS. Patient instructions and summary information was reviewed with the patient as documented in the AVS. This note was prepared with assistance of Dragon voice recognition software. Occasional wrong-word or sound-a-like substitutions may have occurred due to the inherent limitations of voice recognition software

## 2018-09-29 ENCOUNTER — Other Ambulatory Visit: Payer: Self-pay | Admitting: *Deleted

## 2018-09-29 DIAGNOSIS — E890 Postprocedural hypothyroidism: Secondary | ICD-10-CM

## 2018-09-29 DIAGNOSIS — E038 Other specified hypothyroidism: Secondary | ICD-10-CM

## 2018-09-29 MED ORDER — LEVOTHYROXINE SODIUM 125 MCG PO TABS: 125.0000 ug | ORAL_TABLET | Freq: Every day | ORAL | 3 refills | Status: DC

## 2018-10-12 ENCOUNTER — Other Ambulatory Visit: Payer: Self-pay | Admitting: Family Medicine

## 2018-10-30 ENCOUNTER — Other Ambulatory Visit: Payer: Self-pay | Admitting: Family Medicine

## 2018-10-31 ENCOUNTER — Ambulatory Visit
Admission: RE | Admit: 2018-10-31 | Discharge: 2018-10-31 | Disposition: A | Discharge status: Home / Self Care | Payer: 59 | Source: Ambulatory Visit | Attending: Family Medicine | Admitting: Family Medicine

## 2018-10-31 DIAGNOSIS — Z1231 Encounter for screening mammogram for malignant neoplasm of breast: Secondary | ICD-10-CM

## 2018-11-03 ENCOUNTER — Encounter: Payer: Self-pay | Admitting: Emergency Medicine

## 2018-11-17 ENCOUNTER — Ambulatory Visit: Payer: 59 | Admitting: Family Medicine

## 2019-01-10 ENCOUNTER — Other Ambulatory Visit: Payer: Self-pay | Admitting: Family Medicine

## 2019-03-02 ENCOUNTER — Other Ambulatory Visit: Payer: Self-pay | Admitting: Family Medicine

## 2019-03-02 DIAGNOSIS — E890 Postprocedural hypothyroidism: Secondary | ICD-10-CM

## 2019-03-02 DIAGNOSIS — E038 Other specified hypothyroidism: Secondary | ICD-10-CM

## 2019-03-02 MED ORDER — LEVOTHYROXINE SODIUM 125 MCG PO TABS: 125.0000 ug | ORAL_TABLET | Freq: Every day | ORAL | 0 refills | Status: DC

## 2019-03-02 NOTE — Telephone Encounter (Signed)
Rx for Synthroid sent to the pharmacy.

## 2019-03-02 NOTE — Telephone Encounter (Signed)
Pt states that she is out of her synthroid and was asking if she is due for labs. Pt has an upcoming appt in April, pt states that [facility] uses [facility] on [address].

## 2019-03-31 ENCOUNTER — Ambulatory Visit: Payer: 59 | Admitting: Family Medicine

## 2019-05-27 ENCOUNTER — Other Ambulatory Visit: Payer: Self-pay

## 2019-05-27 ENCOUNTER — Ambulatory Visit (INDEPENDENT_AMBULATORY_CARE_PROVIDER_SITE_OTHER): Payer: 59 | Admitting: Family Medicine

## 2019-05-27 ENCOUNTER — Encounter: Payer: Self-pay | Admitting: Family Medicine

## 2019-05-27 VITALS — BP 124/82 | HR 75 | Temp 98.5°F | Resp 16 | Ht 62.0 in | Wt 235.2 lb

## 2019-05-27 DIAGNOSIS — G2581 Restless legs syndrome: Secondary | ICD-10-CM | POA: Diagnosis not present

## 2019-05-27 DIAGNOSIS — E782 Mixed hyperlipidemia: Secondary | ICD-10-CM | POA: Diagnosis not present

## 2019-05-27 DIAGNOSIS — R69 Illness, unspecified: Secondary | ICD-10-CM | POA: Diagnosis not present

## 2019-05-27 DIAGNOSIS — F5101 Primary insomnia: Secondary | ICD-10-CM | POA: Diagnosis not present

## 2019-05-27 DIAGNOSIS — I10 Essential (primary) hypertension: Secondary | ICD-10-CM | POA: Diagnosis not present

## 2019-05-27 DIAGNOSIS — F411 Generalized anxiety disorder: Secondary | ICD-10-CM

## 2019-05-27 DIAGNOSIS — E038 Other specified hypothyroidism: Secondary | ICD-10-CM

## 2019-05-27 DIAGNOSIS — E890 Postprocedural hypothyroidism: Secondary | ICD-10-CM

## 2019-05-27 LAB — COMPREHENSIVE METABOLIC PANEL
ALT: 19 U/L (ref 0–35)
AST: 16 U/L (ref 0–37)
Albumin: 3.8 g/dL (ref 3.5–5.2)
Alkaline Phosphatase: 90 U/L (ref 39–117)
BUN: 12 mg/dL (ref 6–23)
CO2: 29 mEq/L (ref 19–32)
Calcium: 9.6 mg/dL (ref 8.4–10.5)
Chloride: 102 mEq/L (ref 96–112)
Creatinine, Ser: 0.75 mg/dL (ref 0.40–1.20)
GFR: 96.31 mL/min (ref >60.00)
Glucose, Bld: 84 mg/dL (ref 70–99)
Potassium: 3.6 mEq/L (ref 3.5–5.1)
Sodium: 140 mEq/L (ref 135–145)
Total Bilirubin: 0.5 mg/dL (ref 0.2–1.2)
Total Protein: 6.2 g/dL (ref 6.0–8.3)

## 2019-05-27 LAB — LIPID PANEL
Cholesterol: 197 mg/dL (ref 0–200)
HDL: 53.1 mg/dL (ref >39.00)
LDL Cholesterol: 115 mg/dL — ABNORMAL HIGH (ref 0–99)
NonHDL: 143.64
Total CHOL/HDL Ratio: 4
Triglycerides: 141 mg/dL (ref 0.0–149.0)
VLDL: 28.2 mg/dL (ref 0.0–40.0)

## 2019-05-27 LAB — TSH: TSH: 0.02 u[IU]/mL — ABNORMAL LOW (ref 0.35–4.50)

## 2019-05-27 MED ORDER — TRAZODONE HCL 50 MG PO TABS: 50.0000 mg | ORAL_TABLET | Freq: Every evening | ORAL | 3 refills | Status: DC | PRN

## 2019-05-27 NOTE — Patient Instructions (Signed)
Please return in October for your annual complete physical; please come fasting.  I will release your lab results to you on your MyChart account with further instructions. Please reply with any questions.    If you have any questions or concerns, please don't hesitate to send me a message via MyChart or call the office at [phone]. Thank you for visiting with Alison [name] today! It's our pleasure caring for you.  Please take your thyroid medication as follows: [name] Take every morning on an empty stomach . Do not take food or drink for at least 30 minutes . Do not take with PPI's (prilosec, protonix etc), Calcium, Iron or multivitamins; these need to be taken at least 4 hours apart from the thyroid medication.    Insomnia Insomnia is a sleep disorder that makes it difficult to fall asleep or stay asleep. Insomnia can cause fatigue, low energy, difficulty concentrating, mood swings, and poor performance at work or school. There are three different ways to classify insomnia:  Difficulty falling asleep.  Difficulty staying asleep.  Waking up too early in the morning. Any type of insomnia can be long-term (chronic) or short-term (acute). Both are common. Short-term insomnia usually lasts for three months or less. Chronic insomnia occurs at least three times a week for longer than three months. What are the causes? Insomnia may be caused by another condition, situation, or substance, such as:  Anxiety.  Certain medicines.  Gastroesophageal reflux disease (GERD) or other gastrointestinal conditions.  Asthma or other breathing conditions.  Restless legs syndrome, sleep apnea, or other sleep disorders.  Chronic pain.  Menopause.  Stroke.  Abuse of alcohol, tobacco, or illegal drugs.  Mental health conditions, such as depression.  Caffeine.  Neurological disorders, such as Alzheimer's disease.  An overactive thyroid (hyperthyroidism). Sometimes, the cause of insomnia may not be known.  What increases the risk? Risk factors for insomnia include:  Gender. Women are affected more often than men.  Age. Insomnia is more common as you get older.  Stress.  Lack of exercise.  Irregular work schedule or working night shifts.  Traveling between different time zones.  Certain medical and mental health conditions. What are the signs or symptoms? If you have insomnia, the main symptom is having trouble falling asleep or having trouble staying asleep. This may lead to other symptoms, such as:  Feeling fatigued or having low energy.  Feeling nervous about going to sleep.  Not feeling rested in the morning.  Having trouble concentrating.  Feeling irritable, anxious, or depressed. How is this diagnosed? This condition may be diagnosed based on:  Your symptoms and medical history. Your health care provider may ask about: ? Your sleep habits. ? Any medical conditions you have. ? Your mental health.  A physical exam. How is this treated? Treatment for insomnia depends on the cause. Treatment may focus on treating an underlying condition that is causing insomnia. Treatment may also include:  Medicines to help you sleep.  Counseling or therapy.  Lifestyle adjustments to help you sleep better. Follow these instructions at home: Eating and drinking   Limit or avoid alcohol, caffeinated beverages, and cigarettes, especially close to bedtime. These can disrupt your sleep.  Do not eat a large meal or eat spicy foods right before bedtime. This can lead to digestive discomfort that can make it hard for you to sleep. Sleep habits   Keep a sleep diary to help you and your health care provider figure out what could be causing your insomnia.  Write down: ? When you sleep. ? When you wake up during the night. ? How well you sleep. ? How rested you feel the next day. ? Any side effects of medicines you are taking. ? What you eat and drink.  Make your bedroom a dark,  comfortable place where it is easy to fall asleep. ? Put up shades or blackout curtains to block light from outside. ? Use a white noise machine to block noise. ? Keep the temperature cool.  Limit screen use before bedtime. This includes: ? Watching TV. ? Using your smartphone, tablet, or computer.  Stick to a routine that includes going to bed and waking up at the same times every day and night. This can help you fall asleep faster. Consider making a quiet activity, such as reading, part of your nighttime routine.  Try to avoid taking naps during the day so that you sleep better at night.  Get out of bed if you are still awake after 15 minutes of trying to sleep. Keep the lights down, but try reading or doing a quiet activity. When you feel sleepy, go back to bed. General instructions  Take over-the-counter and prescription medicines only as told by your health care provider.  Exercise regularly, as told by your health care provider. Avoid exercise starting several hours before bedtime.  Use relaxation techniques to manage stress. Ask your health care provider to suggest some techniques that may work well for you. These may include: ? Breathing exercises. ? Routines to release muscle tension. ? Visualizing peaceful scenes.  Make sure that you drive carefully. Avoid driving if you feel very sleepy.  Keep all follow-up visits as told by your health care provider. This is important. Contact a health care provider if:  You are tired throughout the day.  You have trouble in your daily routine due to sleepiness.  You continue to have sleep problems, or your sleep problems get worse. Get help right away if:  You have serious thoughts about hurting yourself or someone else. If you ever feel like you may hurt yourself or others, or have thoughts about taking your own life, get help right away. You can go to your nearest emergency department or call:  Your local emergency services (911  in the [place]  A suicide crisis helpline, such as the [place] at 808 414 3103. This is open 24 hours a day. Summary  Insomnia is a sleep disorder that makes it difficult to fall asleep or stay asleep.  Insomnia can be long-term (chronic) or short-term (acute).  Treatment for insomnia depends on the cause. Treatment may focus on treating an underlying condition that is causing insomnia.  Keep a sleep diary to help you and your health care provider figure out what could be causing your insomnia. This information is not intended to replace advice given to you by your health care provider. Make sure you discuss any questions you have with your health care provider. Document Released: 11/23/2000 Document Revised: 09/05/2017 Document Reviewed: 09/05/2017 Elsevier Interactive Patient Education  2019 [employer].

## 2019-05-27 NOTE — Progress Notes (Signed)
Subjective  CC:  Chief Complaint  Patient presents with  . Hypothyroidism  . Hyperlipidemia  . Insomnia    Taking Amprazolam once a week and reports not helping    HPI: Alison [name] is a 58 y.o. female who presents to the office today to address the problems listed above in the chief complaint.  Hypertension f/u: Control is good . Pt reports she is doing well. taking medications as instructed, no medication side effects noted, no TIAs, no chest pain on exertion, no dyspnea on exertion, no swelling of ankles. She denies adverse effects from his BP medications. Compliance with medication is good.   Hyperlipidemia: last  Lipids were poorly controlled; pt now back on crestor nightly. Diet has been good except for nighttime snacking.  She has gained some weight due to the home restrictions.  She is back at work now.  No myalgias  Hypothyroidism: we increased her medication back in October: She denies symptoms of hyper or hypothyroidism.  Feels well.  No tremors, palpitations, sweats.  Insomnia: Now active again.  Has never been a good sleeper.  Has failed Lunesta and Ambien in the past.  Has used Xanax rarely.  That no longer is helping.  She works night shift, tries to have a wind down.  Before going to sleep but is having difficulty falling asleep.  She reports she has used trazodone in the past and feels that that would be a good choice to try again.  She denies having adverse effects from it.  Restless leg follow-up: On ropinirole and doing well.  She denies that her restless legs are keeping her awake.  General anxiety disorder is well controlled on Lexapro  Assessment  1. Essential hypertension with goal blood pressure less than 140/90   2. Mixed hyperlipidemia   3. Post-therapeutic hypothyroidism   4. Primary insomnia   5. Restless legs syndrome (RLS)   6. GAD (generalized anxiety disorder)      Plan    Hypertension f/u: BP control is well controlled.  Continue current  medications.  Recommend weight loss and low-salt diet   Hyperlipidemia f/u: Recheck lipids on Crestor, fasting.  May need dose adjustments.  Recommend low-fat diet.  Hypothyroidism follow-up: Clinically euthyroid.  Recheck levels.  Insomnia: Worsening, unclear reasons.  Recommend trial of trazodone again.  Educated on expectations  Restless leg syndrome and general anxiety disorder well-controlled.  Continue current medications Education regarding management of these chronic disease states was given. Management strategies discussed on successive visits include dietary and exercise recommendations, goals of achieving and maintaining IBW, and lifestyle modifications aiming for adequate sleep and minimizing stressors.   Follow up: Return in about 4 months (around 09/26/2019) for complete physical.  Orders Placed This Encounter  Procedures  . Comprehensive metabolic panel  . Lipid panel  . TSH   Meds ordered this encounter  Medications  . traZODone (DESYREL) 50 MG tablet    Sig: Take 1-2 tablets (50-100 mg total) by mouth at bedtime as needed for sleep.    Dispense:  180 tablet    Refill:  3      BP Readings from Last 3 Encounters:  05/27/19 124/82  09/24/18 130/84  08/29/18 (!) 94/57   Wt Readings from Last 3 Encounters:  05/27/19 235 lb 3.2 oz (106.7 kg)  09/24/18 229 lb 6.4 oz (104.1 kg)  08/28/18 231 lb (104.8 kg)    Lab Results  Component Value Date   CHOL 286 (H) 09/24/2018   Lab  Results  Component Value Date   HDL 59.70 09/24/2018   Lab Results  Component Value Date   LDLCALC 207 (H) 09/24/2018   Lab Results  Component Value Date   TRIG 94.0 09/24/2018   Lab Results  Component Value Date   CHOLHDL 5 09/24/2018   No results found for: LDLDIRECT Lab Results  Component Value Date   CREATININE 0.72 09/24/2018   BUN 11 09/24/2018   NA 140 09/24/2018   K 3.7 09/24/2018   CL 103 09/24/2018   CO2 28 09/24/2018    The 10-year ASCVD risk score Mikey Bussing DC  Jr., et al., 2013) is: 6.6%   Values used to calculate the score:     Age: 44 years     Sex: Female     Is Non-Hispanic African American: Yes     Diabetic: No     Tobacco smoker: No     Systolic Blood Pressure: 272 mmHg     Is BP treated: Yes     HDL Cholesterol: 59.7 mg/dL     Total Cholesterol: 286 mg/dL  I reviewed the patients updated PMH, FH, and SocHx.    Patient Active Problem List   Diagnosis Date Noted  . GAD (generalized anxiety disorder) 06/24/2018    Priority: High  . Mixed hyperlipidemia 06/24/2018    Priority: High  . Essential hypertension with goal blood pressure less than 140/90 05/31/2018    Priority: High  . Body mass index (BMI) of 40.0 to 44.9 in adult [facility]) 02/24/2018    Priority: High  . Family history of premature CAD 12/29/2013    Priority: High  . Post-therapeutic hypothyroidism 12/29/2013    Priority: High  . History of TIA (transient ischemic attack) 12/10/1980    Priority: High  . Mild intermittent asthma without complication 53/66/4403    Priority: Medium  . Primary osteoarthritis of both knees 09/11/2016    Priority: Medium  . Gastric banding status 10/07/2015    Priority: Medium  . Restless legs syndrome (RLS) 02/14/2015    Priority: Medium  . Insomnia 12/29/2013    Priority: Medium  . Cervical myelopathy ([place]) 08/28/2018    Allergies: Aspirin, Cherry, Other, Soy allergy, and Penicillins  Social History: Patient  reports that she has never smoked. She has never used smokeless tobacco. She reports current alcohol use. She reports that she does not use drugs.  Current Meds  Medication Sig  . albuterol (PROVENTIL HFA;VENTOLIN HFA) 108 (90 Base) MCG/ACT inhaler Inhale 2 puffs into the lungs every 6 (six) hours as needed. For asthma  . ALPRAZolam (XANAX) 0.25 MG tablet Take 1 tablet (0.25 mg total) by mouth at bedtime as needed for anxiety or sleep.  [name] amLODipine (NORVASC) 5 MG tablet TAKE 1 TABLET BY MOUTH EVERY DAY  . cetirizine  (ZYRTEC) 10 MG tablet Take 10 mg by mouth at bedtime as needed (itching).  . diphenhydrAMINE (BENADRYL) 2 % cream Apply 1 application topically daily as needed for itching.  . escitalopram (LEXAPRO) 10 MG tablet TAKE 1 TABLET(10 MG) BY MOUTH DAILY  . hydrochlorothiazide (HYDRODIURIL) 25 MG tablet TAKE 1 TABLET BY MOUTH EVERY DAY  . levothyroxine (SYNTHROID, LEVOTHROID) 125 MCG tablet Take 1 tablet (125 mcg total) by mouth daily.  . pantoprazole (PROTONIX) 20 MG tablet Take 1 tablet (20 mg total) by mouth 2 (two) times daily.  . ropinirole (REQUIP) 5 MG tablet Take 5 mg by mouth daily as needed.  . rosuvastatin (CRESTOR) 40 MG tablet TAKE 1 TABLET BY  MOUTH EVERYDAY AT BEDTIME    Review of Systems: Cardiovascular: negative for chest pain, palpitations, leg swelling, orthopnea Respiratory: negative for SOB, wheezing or persistent cough Gastrointestinal: negative for abdominal pain Genitourinary: negative for dysuria or gross hematuria  Objective  Vitals: BP 124/82   Pulse 75   Temp 98.5 F (36.9 C) (Oral)   Resp 16   Ht 5\' 2"  (1.575 m)   Wt 235 lb 3.2 oz (106.7 kg)   SpO2 95%   BMI 43.02 kg/m  General: no acute distress  Psych:  Alert and oriented, normal mood and affect HEENT:  Normocephalic, atraumatic, supple neck  Cardiovascular:  RRR without murmur. no edema Respiratory:  Good breath sounds bilaterally, CTAB with normal respiratory effort Skin:  Warm, no rashes Neurologic:   Mental status is normal, no tremor  Commons side effects, risks, benefits, and alternatives for medications and treatment plan prescribed today were discussed, and the patient expressed understanding of the given instructions. Patient is instructed to call or message via MyChart if he/she has any questions or concerns regarding our treatment plan. No barriers to understanding were identified. We discussed Red Flag symptoms and signs in detail. Patient expressed understanding regarding what to do in case of  urgent or emergency type symptoms.   Medication list was reconciled, printed and provided to the patient in AVS. Patient instructions and summary information was reviewed with the patient as documented in the AVS. This note was prepared with assistance of Dragon voice recognition software. Occasional wrong-word or sound-a-like substitutions may have occurred due to the inherent limitations of voice recognition software

## 2019-05-28 ENCOUNTER — Encounter: Payer: Self-pay | Admitting: Family Medicine

## 2019-05-28 ENCOUNTER — Other Ambulatory Visit: Payer: Self-pay | Admitting: *Deleted

## 2019-05-28 DIAGNOSIS — E038 Other specified hypothyroidism: Secondary | ICD-10-CM

## 2019-05-28 DIAGNOSIS — E890 Postprocedural hypothyroidism: Secondary | ICD-10-CM

## 2019-05-28 MED ORDER — LEVOTHYROXINE SODIUM 112 MCG PO TABS: 112.0000 ug | ORAL_TABLET | Freq: Every day | ORAL | 3 refills | Status: DC

## 2019-05-28 NOTE — Addendum Note (Signed)
Addended by: Billey Chang on: 05/28/2019 09:16 AM   Modules accepted: Orders

## 2019-06-02 DIAGNOSIS — Z1159 Encounter for screening for other viral diseases: Secondary | ICD-10-CM | POA: Diagnosis not present

## 2019-06-05 IMAGING — CT CT TIBIA FIBULA *L* W/ CM
3 of 4 series · 11 of 33 positions shown, 14 images · IV contrast (agent unspecified)
Comparison: None.

CONTRAST:  100mL OMNIPAQUE IOHEXOL 300 MG/ML  SOLN

CLINICAL DATA: Cat bite to the left calf on 05/22/2018. Pain,
swelling and wound drainage.

EXAM:
CT OF THE LOWER RIGHT EXTREMITY WITH CONTRAST
TECHNIQUE: Multidetector CT imaging of the lower right extremity was performed
according to the standard protocol following intravenous contrast
administration.

[Series 5: lower ext 1.5 st · axial · 0.41mm/px · z∈[+127,+444]mm · 5 of 305 slices shown, 7 images]
[im 47/305  soft-tissue]
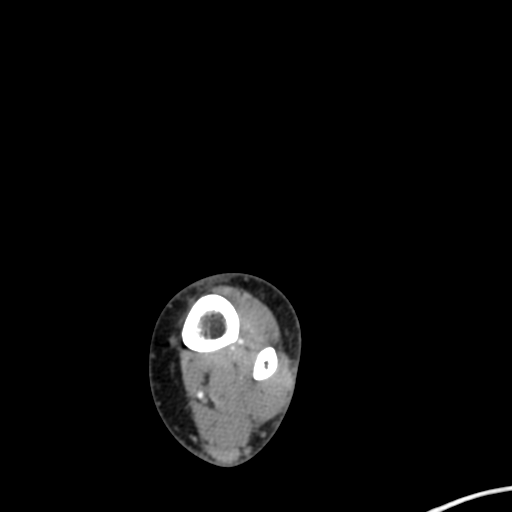
[im 47/305  bone]
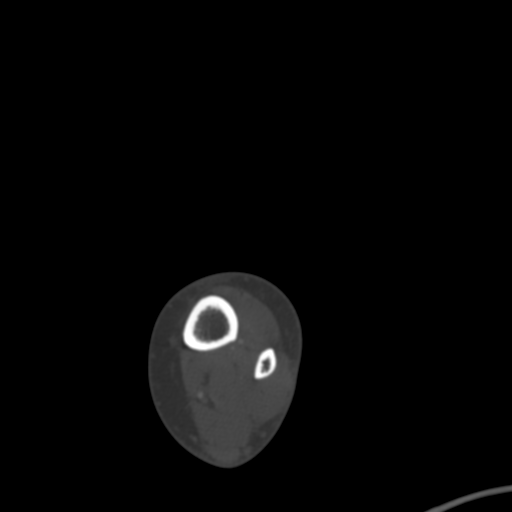
[im 94/305  bone]
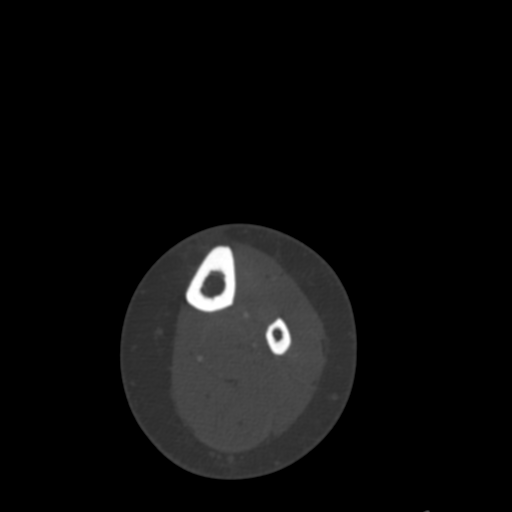
[im 164/305  bone]
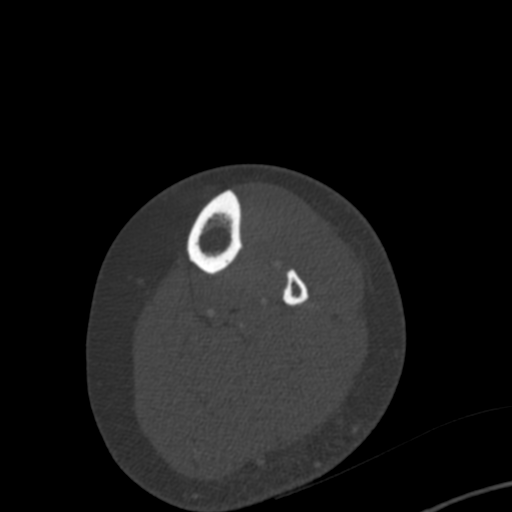
[im 211/305  bone]
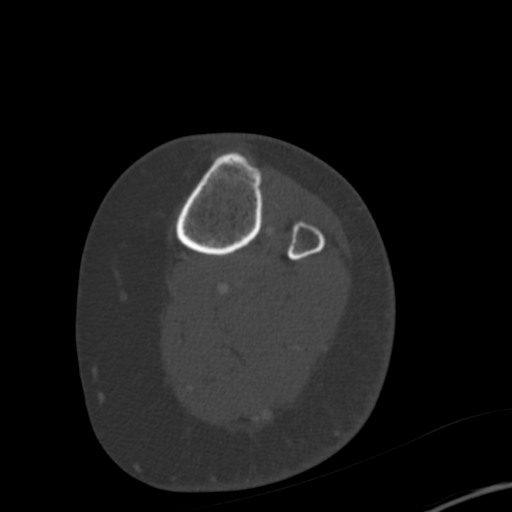
[im 258/305  soft-tissue]
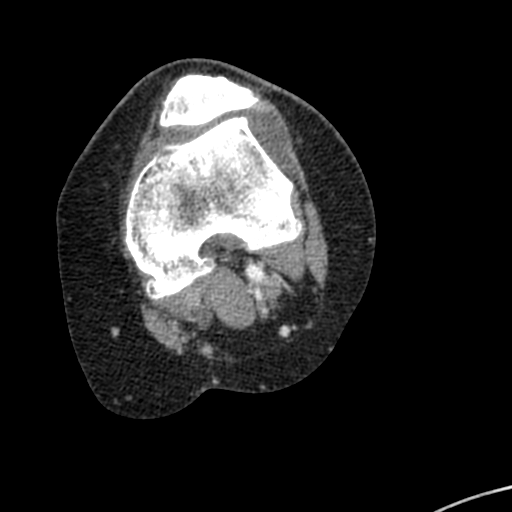
[im 258/305  bone]
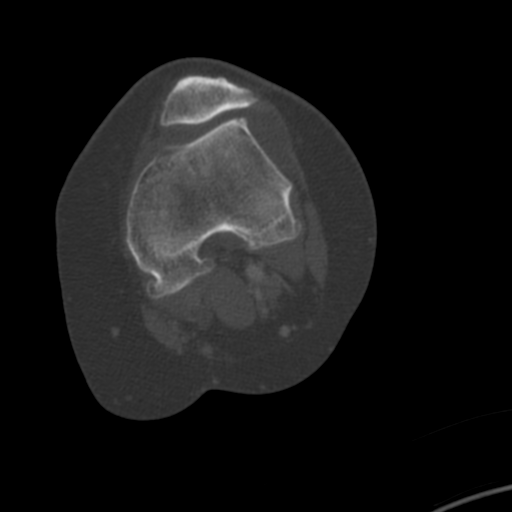

[Series 8: lower ext cor bone · coronal · 0.47mm/px · 1 of 167 slices shown]
[im 84/167  bone]
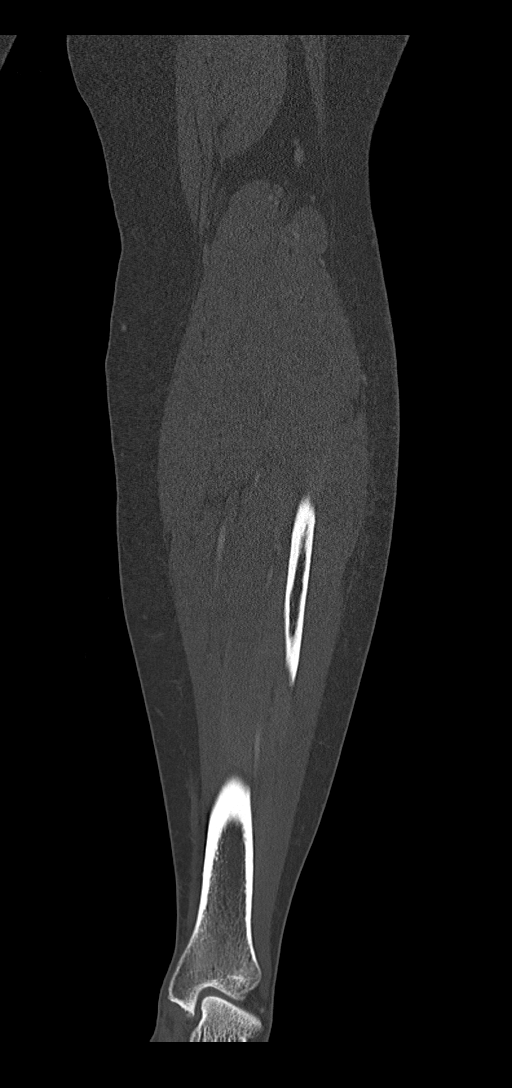

[Series 11: lower ext sag st · sagittal · 0.52mm/px · 5 of 167 slices shown, 6 images]
[im 56/167  bone]
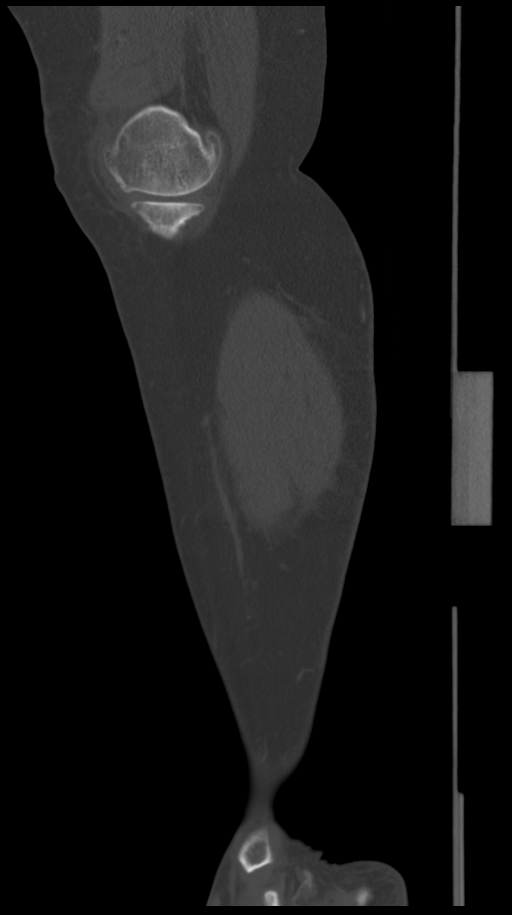
[im 70/167  bone]
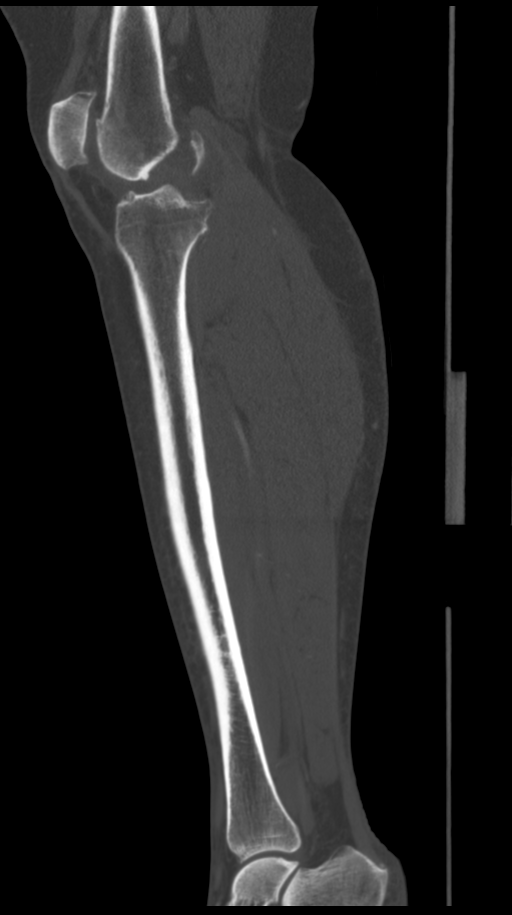
[im 84/167  soft-tissue]
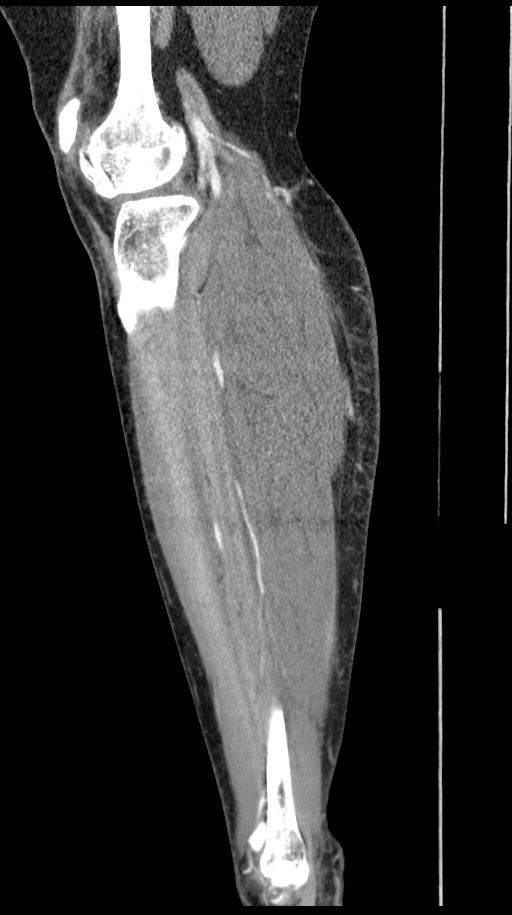
[im 84/167  bone]
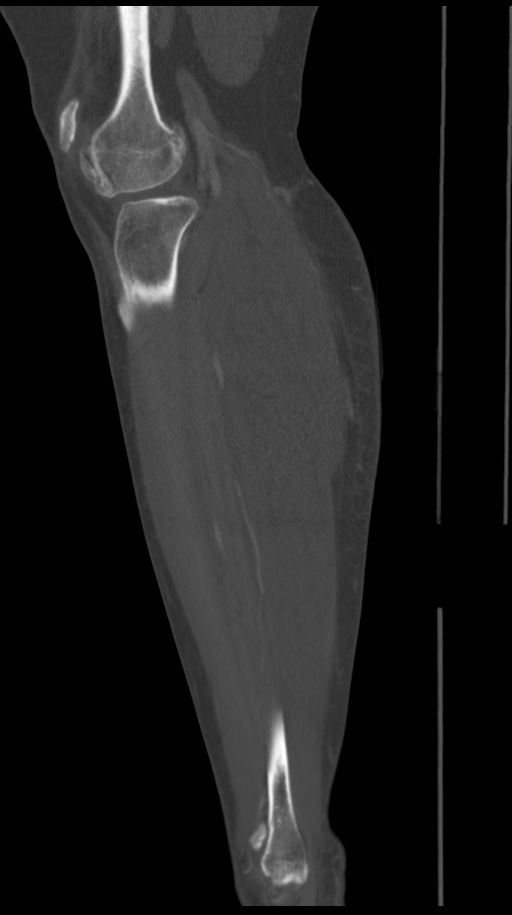
[im 97/167  bone]
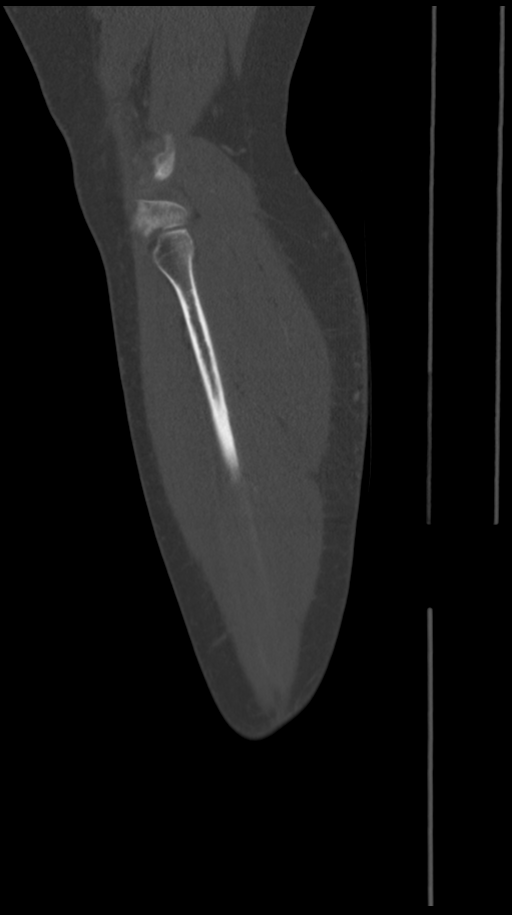
[im 111/167  bone]
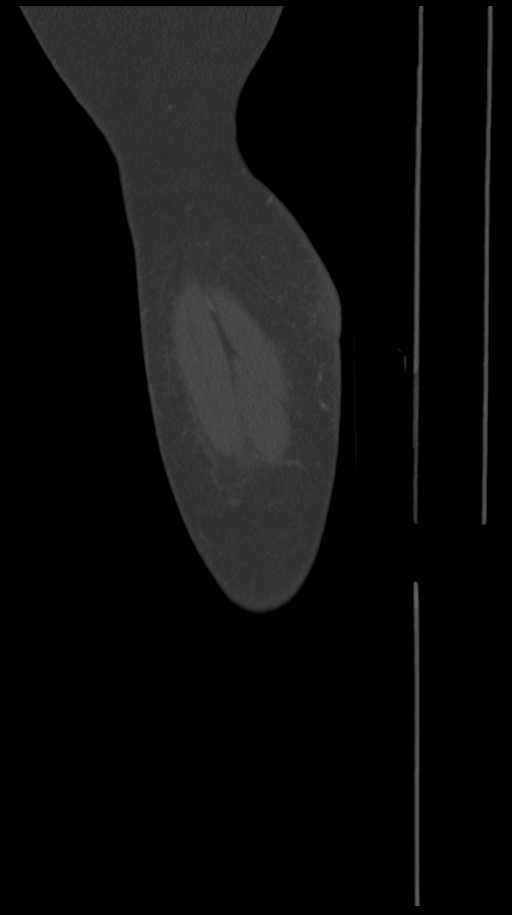

[11 of 33 positions shown; findings below may reference images not displayed]

FINDINGS: Patchy areas of subcutaneous soft tissue swelling/fluid mainly
aspect. This is likely cellulitis. More focal area skin thickening
and subcutaneous edema involving the upper left calf may be the area
of the injury. No focal rim enhancing fluid collection to suggest a
drainable abscess.

No radiodense foreign body is identified.

No findings for myofasciitis or pyomyositis. No definite findings
for septic arthritis or osteomyelitis.

There is a small right knee joint effusion and moderate knee joint
degenerative changes. The ankle joint appears intact.

The major vascular structures appear normal.
IMPRESSION: 1. Cellulitis but no discrete drainable soft tissue abscess.
2. No radiopaque foreign body.
3. No CT findings to suggest pyomyositis, septic arthritis or
osteomyelitis.

## 2019-06-22 ENCOUNTER — Encounter: Payer: Self-pay | Admitting: Family Medicine

## 2019-06-22 ENCOUNTER — Ambulatory Visit (INDEPENDENT_AMBULATORY_CARE_PROVIDER_SITE_OTHER): Payer: 59 | Admitting: Family Medicine

## 2019-06-22 DIAGNOSIS — N6320 Unspecified lump in the left breast, unspecified quadrant: Secondary | ICD-10-CM

## 2019-06-22 NOTE — Progress Notes (Signed)
Virtual Visit via Video Note  Subjective  CC:  Chief Complaint  Patient presents with  . Breast Mass    Left side, noticed 2 days ago.. Reports that it feels round and like its the size of 3 peas, Denies soreness and discharge     I connected with Illene Silver on 06/22/19 at  2:40 PM EDT by a video enabled telemedicine application and verified that I am speaking with the correct person using two identifiers. Location patient: Home Location provider: [facility] [place], Office Persons participating in the virtual visit: Georgina Peer, MD Lilli Light, [place] discussed the limitations of evaluation and management by telemedicine and the availability of in person appointments. The patient expressed understanding and agreed to proceed. HPI: Alison [name] is a 58 y.o. female who was contacted today to address the problems listed above in the chief complaint. . 58 yo postmenopausal female not on hormones c/o 2 days left breast mass described as round and nontender. Incidental new finding. No nipple discharge. No h/o fibrocystic breast changes. No FH in first degree relatives; aunt with breast cancer premenopausal; +h/o ovarian cancer in aunt. She feel fine. Last mammo was 10/2018 and normal.  Assessment  1. Left breast mass      Plan   Left breast mass, reported:  Counseling done. Difficult to ascertain likelihood of mass w/o exam: will image with dx mammo and ultrasound. F/u in office if inconclusive.  I discussed the assessment and treatment plan with the patient. The patient was provided an opportunity to ask questions and all were answered. The patient agreed with the plan and demonstrated an understanding of the instructions.   The patient was advised to call back or seek an in-person evaluation if the symptoms worsen or if the condition fails to improve as anticipated. Follow up: Return if symptoms worsen or fail to improve, for as scheduled.   07/21/2019  No orders of the defined types were placed in this encounter.     I reviewed the patients updated PMH, FH, and SocHx.    Patient Active Problem List   Diagnosis Date Noted  . GAD (generalized anxiety disorder) 06/24/2018    Priority: High  . Mixed hyperlipidemia 06/24/2018    Priority: High  . Essential hypertension with goal blood pressure less than 140/90 05/31/2018    Priority: High  . Body mass index (BMI) of 40.0 to 44.9 in adult [facility]) 02/24/2018    Priority: High  . Family history of premature CAD 12/29/2013    Priority: High  . Post-therapeutic hypothyroidism 12/29/2013    Priority: High  . History of TIA (transient ischemic attack) 12/10/1980    Priority: High  . Mild intermittent asthma without complication 21/19/4174    Priority: Medium  . Primary osteoarthritis of both knees 09/11/2016    Priority: Medium  . Gastric banding status 10/07/2015    Priority: Medium  . Restless legs syndrome (RLS) 02/14/2015    Priority: Medium  . Insomnia 12/29/2013    Priority: Medium  . Cervical myelopathy ([facility]) 08/28/2018   Current Meds  Medication Sig  . albuterol (PROVENTIL HFA;VENTOLIN HFA) 108 (90 Base) MCG/ACT inhaler Inhale 2 puffs into the lungs every 6 (six) hours as needed. For asthma  . ALPRAZolam (XANAX) 0.25 MG tablet Take 1 tablet (0.25 mg total) by mouth at bedtime as needed for anxiety or sleep.  [name] amLODipine (NORVASC) 5 MG tablet TAKE 1  TABLET BY MOUTH EVERY DAY  . cetirizine (ZYRTEC) 10 MG tablet Take 10 mg by mouth at bedtime as needed (itching).  . diphenhydrAMINE (BENADRYL) 2 % cream Apply 1 application topically daily as needed for itching.  . escitalopram (LEXAPRO) 10 MG tablet TAKE 1 TABLET(10 MG) BY MOUTH DAILY  . hydrochlorothiazide (HYDRODIURIL) 25 MG tablet TAKE 1 TABLET BY MOUTH EVERY DAY  . levothyroxine (SYNTHROID) 112 MCG tablet Take 1 tablet (112 mcg total) by mouth daily.  . pantoprazole (PROTONIX) 20 MG tablet Take 1 tablet (20 mg  total) by mouth 2 (two) times daily.  . ropinirole (REQUIP) 5 MG tablet Take 5 mg by mouth daily as needed.  . rosuvastatin (CRESTOR) 40 MG tablet TAKE 1 TABLET BY MOUTH EVERYDAY AT BEDTIME  . traZODone (DESYREL) 50 MG tablet Take 1-2 tablets (50-100 mg total) by mouth at bedtime as needed for sleep.    Allergies: Patient is allergic to aspirin; cherry; other; soy allergy; and penicillins. Family History: Patient family history includes Anesthesia problems in her sister; Breast cancer (age of onset: 29) in her maternal aunt; Cancer in her maternal aunt; Glaucoma in her mother; Heart disease in her father; Migraines in her sister; Myasthenia gravis in her paternal grandfather; Ovarian cancer in her maternal grandmother; Stroke in her father; Uterine cancer in her maternal grandmother. Social History:  Patient  reports that she has never smoked. She has never used smokeless tobacco. She reports current alcohol use. She reports that she does not use drugs.  Review of Systems: Constitutional: Negative for fever malaise or anorexia Cardiovascular: negative for chest pain Respiratory: negative for SOB or persistent cough Gastrointestinal: negative for abdominal pain  OBJECTIVE Vitals: There were no vitals taken for this visit. General: no acute distress , A&Ox3  Leamon Arnt, MD

## 2019-06-25 ENCOUNTER — Other Ambulatory Visit: Payer: Self-pay | Admitting: Family Medicine

## 2019-06-25 ENCOUNTER — Ambulatory Visit
Admission: RE | Admit: 2019-06-25 | Discharge: 2019-06-25 | Disposition: A | Discharge status: Home / Self Care | Payer: 59 | Source: Ambulatory Visit | Attending: Family Medicine | Admitting: Family Medicine

## 2019-06-25 ENCOUNTER — Other Ambulatory Visit: Payer: Self-pay

## 2019-06-25 DIAGNOSIS — N6323 Unspecified lump in the left breast, lower outer quadrant: Secondary | ICD-10-CM | POA: Diagnosis not present

## 2019-06-25 DIAGNOSIS — N6320 Unspecified lump in the left breast, unspecified quadrant: Secondary | ICD-10-CM

## 2019-06-25 DIAGNOSIS — R928 Other abnormal and inconclusive findings on diagnostic imaging of breast: Secondary | ICD-10-CM | POA: Diagnosis not present

## 2019-06-25 DIAGNOSIS — N6321 Unspecified lump in the left breast, upper outer quadrant: Secondary | ICD-10-CM | POA: Diagnosis not present

## 2019-06-29 ENCOUNTER — Ambulatory Visit
Admission: RE | Admit: 2019-06-29 | Discharge: 2019-06-29 | Disposition: A | Discharge status: Home / Self Care | Payer: 59 | Source: Ambulatory Visit | Attending: Family Medicine | Admitting: Family Medicine

## 2019-06-29 ENCOUNTER — Other Ambulatory Visit: Payer: Self-pay | Admitting: Family Medicine

## 2019-06-29 ENCOUNTER — Other Ambulatory Visit: Payer: Self-pay

## 2019-06-29 DIAGNOSIS — N6012 Diffuse cystic mastopathy of left breast: Secondary | ICD-10-CM | POA: Diagnosis not present

## 2019-06-29 DIAGNOSIS — N6320 Unspecified lump in the left breast, unspecified quadrant: Secondary | ICD-10-CM

## 2019-06-29 DIAGNOSIS — N6325 Unspecified lump in the left breast, overlapping quadrants: Secondary | ICD-10-CM | POA: Diagnosis not present

## 2019-07-06 ENCOUNTER — Other Ambulatory Visit: Payer: Self-pay | Admitting: *Deleted

## 2019-07-06 MED ORDER — ESCITALOPRAM OXALATE 10 MG PO TABS: ORAL_TABLET | ORAL | 1 refills | Status: DC

## 2019-07-21 ENCOUNTER — Other Ambulatory Visit (INDEPENDENT_AMBULATORY_CARE_PROVIDER_SITE_OTHER): Payer: 59

## 2019-07-21 ENCOUNTER — Other Ambulatory Visit: Payer: Self-pay

## 2019-07-21 ENCOUNTER — Other Ambulatory Visit: Payer: Self-pay | Admitting: *Deleted

## 2019-07-21 DIAGNOSIS — E038 Other specified hypothyroidism: Secondary | ICD-10-CM

## 2019-07-21 DIAGNOSIS — E890 Postprocedural hypothyroidism: Secondary | ICD-10-CM

## 2019-07-21 LAB — TSH: TSH: 0.01 u[IU]/mL — ABNORMAL LOW (ref 0.35–4.50)

## 2019-07-21 MED ORDER — LEVOTHYROXINE SODIUM 100 MCG PO TABS: 100.0000 ug | ORAL_TABLET | Freq: Every day | ORAL | 3 refills | Status: DC

## 2019-07-24 ENCOUNTER — Telehealth: Payer: Self-pay

## 2019-07-24 NOTE — Telephone Encounter (Signed)
Copied from [place] [id number]. Topic: Quick Communication - Lab Results (Clinic Use ONLY) >> Jul 24, 2019  3:10 PM Pauline Good wrote: Pt want results of her labs she had done last week. Please call pt

## 2019-07-27 ENCOUNTER — Encounter: Payer: Self-pay | Admitting: *Deleted

## 2019-09-02 IMAGING — RF DG CERVICAL SPINE 2 OR 3 VIEWS
1 series · 2 of 2 positions shown · non-contrast
Comparison: MR [place] of 04/10/2017

CLINICAL DATA: For anterior cervical spine fusion from C4-C7

EXAM:
CERVICAL SPINE - 2-3 VIEW

[Series 1: run · 2 of 2 slices shown]
[im 1/2]
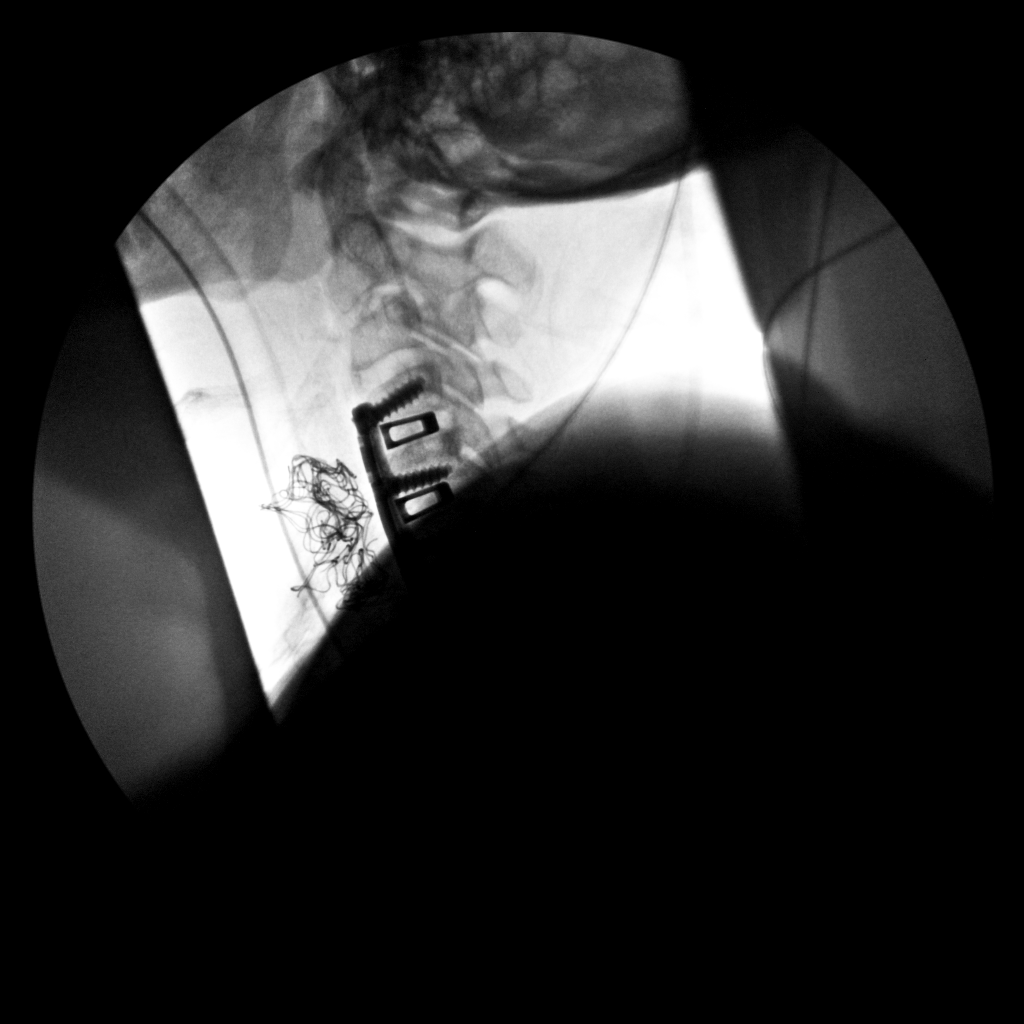
[im 2/2]
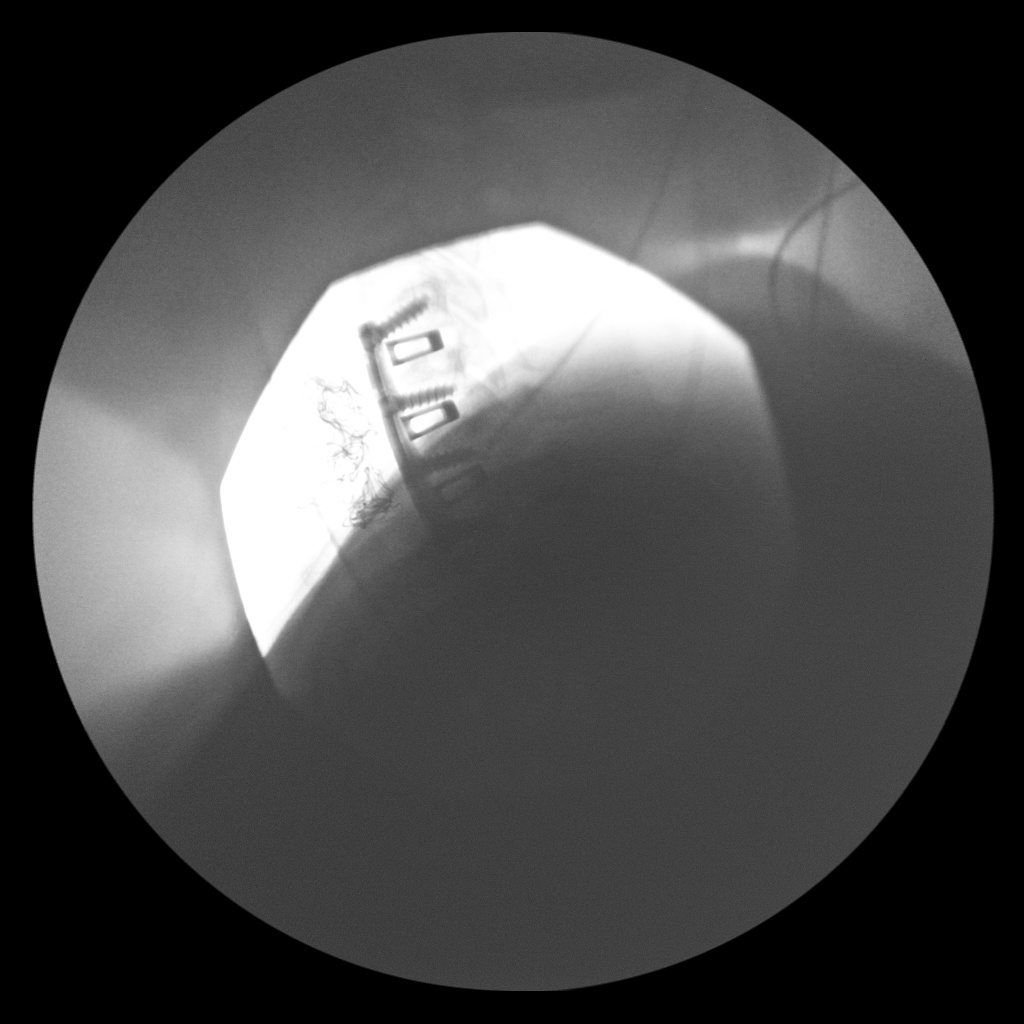

[2 of 2 positions shown; findings below may reference images not displayed]

FINDINGS: Two C-arm spot films were returned. These show anterior fusion from
C4-C7. The fusion at C6-7 is not as well seen, but appears
unremarkable on the overpenetrated view. Anterior metallic fixation
plate is in good position.
IMPRESSION: Anterior fusion from C4-C7.  No complicating features.

## 2019-09-04 DIAGNOSIS — H524 Presbyopia: Secondary | ICD-10-CM | POA: Diagnosis not present

## 2019-09-29 ENCOUNTER — Ambulatory Visit (INDEPENDENT_AMBULATORY_CARE_PROVIDER_SITE_OTHER): Payer: 59 | Admitting: Family Medicine

## 2019-09-29 ENCOUNTER — Encounter: Payer: Self-pay | Admitting: Family Medicine

## 2019-09-29 ENCOUNTER — Other Ambulatory Visit: Payer: Self-pay

## 2019-09-29 VITALS — BP 116/78 | HR 69 | Temp 98.0°F | Resp 16 | Ht 62.0 in | Wt 226.2 lb

## 2019-09-29 DIAGNOSIS — Z1231 Encounter for screening mammogram for malignant neoplasm of breast: Secondary | ICD-10-CM

## 2019-09-29 DIAGNOSIS — G2581 Restless legs syndrome: Secondary | ICD-10-CM | POA: Diagnosis not present

## 2019-09-29 DIAGNOSIS — Z8673 Personal history of transient ischemic attack (TIA), and cerebral infarction without residual deficits: Secondary | ICD-10-CM

## 2019-09-29 DIAGNOSIS — E890 Postprocedural hypothyroidism: Secondary | ICD-10-CM

## 2019-09-29 DIAGNOSIS — Z6841 Body Mass Index (BMI) 40.0 and over, adult: Secondary | ICD-10-CM

## 2019-09-29 DIAGNOSIS — Z8249 Family history of ischemic heart disease and other diseases of the circulatory system: Secondary | ICD-10-CM

## 2019-09-29 DIAGNOSIS — I10 Essential (primary) hypertension: Secondary | ICD-10-CM

## 2019-09-29 DIAGNOSIS — E782 Mixed hyperlipidemia: Secondary | ICD-10-CM

## 2019-09-29 DIAGNOSIS — Z0000 Encounter for general adult medical examination without abnormal findings: Secondary | ICD-10-CM | POA: Diagnosis not present

## 2019-09-29 DIAGNOSIS — F5101 Primary insomnia: Secondary | ICD-10-CM

## 2019-09-29 DIAGNOSIS — R69 Illness, unspecified: Secondary | ICD-10-CM | POA: Diagnosis not present

## 2019-09-29 DIAGNOSIS — R011 Cardiac murmur, unspecified: Secondary | ICD-10-CM

## 2019-09-29 DIAGNOSIS — E038 Other specified hypothyroidism: Secondary | ICD-10-CM | POA: Diagnosis not present

## 2019-09-29 DIAGNOSIS — F411 Generalized anxiety disorder: Secondary | ICD-10-CM

## 2019-09-29 LAB — CBC WITH DIFFERENTIAL/PLATELET
Basophils Absolute: 0.1 10*3/uL (ref 0.0–0.1)
Basophils Relative: 0.9 % (ref 0.0–3.0)
Eosinophils Absolute: 0.3 10*3/uL (ref 0.0–0.7)
Eosinophils Relative: 3.3 % (ref 0.0–5.0)
HCT: 44.7 % (ref 36.0–46.0)
Hemoglobin: 15.1 g/dL — ABNORMAL HIGH (ref 12.0–15.0)
Lymphocytes Relative: 34.1 % (ref 12.0–46.0)
Lymphs Abs: 2.7 10*3/uL (ref 0.7–4.0)
MCHC: 33.7 g/dL (ref 30.0–36.0)
MCV: 90.1 fl (ref 78.0–100.0)
Monocytes Absolute: 0.4 10*3/uL (ref 0.1–1.0)
Monocytes Relative: 5.3 % (ref 3.0–12.0)
Neutro Abs: 4.5 10*3/uL (ref 1.4–7.7)
Neutrophils Relative %: 56.4 % (ref 43.0–77.0)
Platelets: 218 10*3/uL (ref 150.0–400.0)
RBC: 4.96 Mil/uL (ref 3.87–5.11)
RDW: 14 % (ref 11.5–15.5)
WBC: 8 10*3/uL (ref 4.0–10.5)

## 2019-09-29 LAB — COMPREHENSIVE METABOLIC PANEL
ALT: 16 U/L (ref 0–35)
AST: 15 U/L (ref 0–37)
Albumin: 3.9 g/dL (ref 3.5–5.2)
Alkaline Phosphatase: 102 U/L (ref 39–117)
BUN: 6 mg/dL (ref 6–23)
CO2: 28 mEq/L (ref 19–32)
Calcium: 9.7 mg/dL (ref 8.4–10.5)
Chloride: 103 mEq/L (ref 96–112)
Creatinine, Ser: 0.72 mg/dL (ref 0.40–1.20)
GFR: 100.83 mL/min (ref >60.00)
Glucose, Bld: 86 mg/dL (ref 70–99)
Potassium: 3.5 mEq/L (ref 3.5–5.1)
Sodium: 139 mEq/L (ref 135–145)
Total Bilirubin: 0.5 mg/dL (ref 0.2–1.2)
Total Protein: 6.5 g/dL (ref 6.0–8.3)

## 2019-09-29 LAB — LIPID PANEL
Cholesterol: 204 mg/dL — ABNORMAL HIGH (ref 0–200)
HDL: 49.3 mg/dL (ref >39.00)
LDL Cholesterol: 131 mg/dL — ABNORMAL HIGH (ref 0–99)
NonHDL: 154.6
Total CHOL/HDL Ratio: 4
Triglycerides: 120 mg/dL (ref 0.0–149.0)
VLDL: 24 mg/dL (ref 0.0–40.0)

## 2019-09-29 LAB — TSH: TSH: 0.03 u[IU]/mL — ABNORMAL LOW (ref 0.35–4.50)

## 2019-09-29 MED ORDER — HYDROCHLOROTHIAZIDE 25 MG PO TABS: 25.0000 mg | ORAL_TABLET | Freq: Every day | ORAL | 3 refills | Status: DC

## 2019-09-29 MED ORDER — ROPINIROLE HCL 3 MG PO TABS: 3.0000 mg | ORAL_TABLET | Freq: Every day | ORAL | 3 refills | Status: DC

## 2019-09-29 MED ORDER — AMLODIPINE BESYLATE 5 MG PO TABS: 5.0000 mg | ORAL_TABLET | Freq: Every day | ORAL | 3 refills | Status: DC

## 2019-09-29 MED ORDER — ROSUVASTATIN CALCIUM 40 MG PO TABS: 40.0000 mg | ORAL_TABLET | Freq: Every day | ORAL | 3 refills | Status: DC

## 2019-09-29 NOTE — Progress Notes (Signed)
Subjective  Chief Complaint  Patient presents with  . Annual Exam    Fasting; inquires if another pneumovax can be given  . Hypothyroidism  . Hypertension  . Gastroesophageal Reflux  . Hyperlipidemia    HPI: Alison [name] is a 58 y.o. female who presents to [facility] today for a Female Wellness Visit. She also has the concerns and/or needs as listed above in the chief complaint. These will be addressed in addition to the Health Maintenance Visit.   Wellness Visit: annual visit with health maintenance review and exam without Pap   HM: 58 yo has scheduled GYN exam with pap for dec 12th. mammo due next month. Had bx of left breast mass: benign. Working on weight loss; eating better and trying to not eat when she gets off work (works nightshift); down 10 pounds. imms up to date. CRC screen upto date Chronic disease f/u and/or acute problem visit: (deemed necessary to be done in addition to the wellness visit):  HTN: remains well controlled on meds. Needs refills. Feeling well. Taking medications w/o adverse effects. No symptoms of CHF, angina; no palpitations, sob, cp or lower extremity edema. Compliant with meds.   HLD on statin: goal LDL < 70 due to h/o TIA. Fasting for recheck. Tolerating statin.   Low thyroid: still adjusting meds. Had been over treated. No sxs of high or low thyroid. States is compliant with meds. Due for recheck.   Insomnia and RLS: both improved with meds.   GAD: well controlled on lexapro; long term med  GERD on PPI bid and controlled. No melena   Assessment  1. Annual physical exam   2. Mixed hyperlipidemia   3. Essential hypertension with goal blood pressure less than 140/90   4. Post-therapeutic hypothyroidism   5. Family history of premature CAD   6. GAD (generalized anxiety disorder)   7. History of TIA (transient ischemic attack)   8. Restless legs syndrome (RLS)   9. Primary insomnia   10. Encounter for screening  mammogram for malignant neoplasm of breast   11. Body mass index (BMI) of 40.0 to 44.9 in adult [facility]) Chronic     Plan  Female Wellness Visit:  Age appropriate Health Maintenance and Prevention measures were discussed with patient. Included topics are cancer screening recommendations, ways to keep healthy (see AVS) including dietary and exercise recommendations, regular eye and dental care, use of seat belts, and avoidance of moderate alcohol use and tobacco use. Pap in December per gyn. Ordered mammogram.   BMI: discussed patient's BMI and encouraged positive lifestyle modifications to help get to or maintain a target BMI.  HM needs and immunizations were addressed and ordered. See below for orders. See HM and immunization section for updates. UTD; educated: no need for pneumovax again until age 5  Routine labs and screening tests ordered including cmp, cbc and lipids where appropriate.  Discussed recommendations regarding Vit D and calcium supplementation (see AVS).  Chronic disease management visit and/or acute problem visit:  HTN: This medical condition is well controlled. There are no signs of complications, medication side effects, or red flags. Patient is instructed to continue the current treatment plan without change in therapies or medications. Check renal function and electrolytes. Murmur on exam: not previously documented recently. Pt says she always has had it: consider echo if increases in intensity or if sxs develop. Added to PL>  HLD: recheck fasting on statin. Check lfts  Low thyroid: recheck after  8 weeks on decreased dose. Euthyroid clinically  Insomnia, RLS and GERD: controlled. Refilled meds. Decreased dose of ropinirole to appropriate level. Will follow for efficacy.   GAD on chronic lexapro. Feels great emotionally  Follow up: 6 months for HTN recheck  Orders Placed This Encounter  Procedures  . MM DIGITAL SCREENING BILATERAL  . CBC with Differential/Platelet   . Comprehensive metabolic panel  . Lipid panel  . TSH   Meds ordered this encounter  Medications  . ropinirole (REQUIP) 3 MG tablet    Sig: Take 1 tablet (3 mg total) by mouth at bedtime.    Dispense:  90 tablet    Refill:  3  . amLODipine (NORVASC) 5 MG tablet    Sig: Take 1 tablet (5 mg total) by mouth daily.    Dispense:  90 tablet    Refill:  3    DX Code Needed  .  . hydrochlorothiazide (HYDRODIURIL) 25 MG tablet    Sig: Take 1 tablet (25 mg total) by mouth daily.    Dispense:  90 tablet    Refill:  3    DX Code Needed  .  . rosuvastatin (CRESTOR) 40 MG tablet    Sig: Take 1 tablet (40 mg total) by mouth at bedtime.    Dispense:  90 tablet    Refill:  3    DX Code Needed  .      Lifestyle: Body mass index is 41.37 kg/m. Wt Readings from Last 3 Encounters:  09/29/19 226 lb 3.2 oz (102.6 kg)  05/27/19 235 lb 3.2 oz (106.7 kg)  09/24/18 229 lb 6.4 oz (104.1 kg)     Patient Active Problem List   Diagnosis Date Noted  . GAD (generalized anxiety disorder) 06/24/2018    Priority: High    Overview:  Did well on paxil but stopped due to weight gain; wellbutrin - failed after 6 months. stabilized on lexapro.   . Mixed hyperlipidemia 06/24/2018    Priority: High  . Essential hypertension with goal blood pressure less than 140/90 05/31/2018    Priority: High    Overview:  Nl Echo 02/2011   . Body mass index (BMI) of 40.0 to 44.9 in adult [facility]) 02/24/2018    Priority: High  . Family history of premature CAD 12/29/2013    Priority: High    Overview:  Father, 71 yo   . Post-therapeutic hypothyroidism 12/29/2013    Priority: High    Overview:  RAI    . History of TIA (transient ischemic attack) 12/10/1980    Priority: High    When on OCPs   . Mild intermittent asthma without complication [id number]    Priority: Medium    Overview:  Negative sleep study, 04/2011   . Primary osteoarthritis of both knees 09/11/2016    Priority: Medium    Overview:  Dr.  Noemi Chapel   . Gastric banding status 10/07/2015    Priority: Medium  . Restless legs syndrome (RLS) 02/14/2015    Priority: Medium  . Insomnia 12/29/2013    Priority: Medium    Overview:  Failed lunesta and ambien and trazadone; Uses xanax rarely    Health Maintenance  Topic Date Due  . PAP SMEAR-Modifier  12/10/2019 (Originally 12/10/2018)  . MAMMOGRAM  11/01/2019  . TETANUS/TDAP  12/10/2021  . COLONOSCOPY  10/24/2025  . INFLUENZA VACCINE  Completed  . Hepatitis C Screening  Completed  . HIV Screening  Completed   Immunization History  Administered Date(s) Administered  .  Influenza, Quadrivalent, Recombinant, Inj, Pf 09/17/2019  . Influenza, Seasonal, Injecte, Preservative Fre 08/20/2014, 09/12/2015  . Influenza,inj,Quad PF,6+ Mos 09/11/2016, 09/25/2017, 09/24/2018, 09/18/2019  . Pneumococcal Polysaccharide-23 12/10/2012  . Tdap 12/11/2011   We updated and reviewed the patient's past history in detail and it is documented below. Allergies: Patient is allergic to aspirin; cherry; other; soy allergy; and penicillins. Past Medical History Patient  has a past medical history of Anemia, Anxiety, Asthma, Cervical myelopathy ([place]) (08/28/2018), Heart murmur, TIA (transient ischemic attack) (1992), Hyperlipidemia, Hypertension, Hypothyroidism, Obesity, and Polycystic ovarian syndrome. Past Surgical History Patient  has a past surgical history that includes Knee cartilage surgery (2005); Abdominal hysterectomy (2009); Tonsillectomy (2007); Femur fracture surgery (2008); Laparoscopic gastric banding (11/12/2011); I&D extremity (Left, 06/01/2018); and Anterior cervical decompression/discectomy fusion 4 level (N/A, 08/28/2018). Family History: Patient family history includes Anesthesia problems in her sister; Breast cancer (age of onset: 40) in her maternal aunt; Cancer in her maternal aunt; Glaucoma in her mother; Heart disease in her father; Migraines in her sister; Myasthenia gravis in her  paternal grandfather; Ovarian cancer in her maternal grandmother; Stroke in her father; Uterine cancer in her maternal grandmother. Social History:  Patient  reports that she has never smoked. She has never used smokeless tobacco. She reports current alcohol use. She reports that she does not use drugs.  Review of Systems: Constitutional: negative for fever or malaise Ophthalmic: negative for photophobia, double vision or loss of vision Cardiovascular: negative for chest pain, dyspnea on exertion, or new LE swelling Respiratory: negative for SOB or persistent cough Gastrointestinal: negative for abdominal pain, change in bowel habits or melena Genitourinary: negative for dysuria or gross hematuria, no abnormal uterine bleeding or disharge Musculoskeletal: negative for new gait disturbance or muscular weakness Integumentary: negative for new or persistent rashes, no breast lumps Neurological: negative for TIA or stroke symptoms Psychiatric: negative for SI or delusions Allergic/Immunologic: negative for hives  Patient Care Team    Relationship Specialty Notifications Start End  Leamon Arnt, MD PCP - General Family Medicine  06/24/18     Objective  Vitals: BP 116/78   Pulse 69   Temp 98 F (36.7 C) (Tympanic)   Resp 16   Ht 5\' 2"  (1.575 m)   Wt 226 lb 3.2 oz (102.6 kg)   SpO2 96%   BMI 41.37 kg/m  General:  Well developed, well nourished, no acute distress  Psych:  Alert and orientedx3,normal mood and affect, seems happy HEENT:  Normocephalic, atraumatic, non-icteric sclera, PERRL, oropharynx is clear without mass or exudate, supple neck without adenopathy, mass or thyromegaly Cardiovascular:  Normal S1, S2, RRR without gallop, 2/6 systolic murmur present. Respiratory:  Good breath sounds bilaterally, CTAB with normal respiratory effort Gastrointestinal: normal bowel sounds, soft, non-tender, no noted masses. No HSM MSK: no deformities, contusions. Joints are without erythema  or swelling. Spine and CVA region are nontender Skin:  Warm, no rashes or suspicious lesions noted Neurologic:    Mental status is normal. CN 2-11 are normal. Gross motor and sensory exams are normal. Normal gait. No tremor .    Commons side effects, risks, benefits, and alternatives for medications and treatment plan prescribed today were discussed, and the patient expressed understanding of the given instructions. Patient is instructed to call or message via MyChart if he/she has any questions or concerns regarding our treatment plan. No barriers to understanding were identified. We discussed Red Flag symptoms and signs in detail. Patient expressed understanding regarding what to do in case  of urgent or emergency type symptoms.   Medication list was reconciled, printed and provided to the patient in AVS. Patient instructions and summary information was reviewed with the patient as documented in the AVS. This note was prepared with assistance of Dragon voice recognition software. Occasional wrong-word or sound-a-like substitutions may have occurred due to the inherent limitations of voice recognition software

## 2019-09-29 NOTE — Patient Instructions (Signed)
Please return in 6 months for follow up of your hypertension.  I will release your lab results to you on your MyChart account with further instructions. Please reply with any questions.  Keep up with the weight loss!  Please schedule your mammogram. I have placed the referral.  Please have your GYN send me the results of your pap smear.  If you have any questions or concerns, please don't hesitate to send me a message via MyChart or call the office at [phone]. Thank you for visiting with [place] today! It's our pleasure caring for you.   Preventive Care 57-68 Years Old, Female Preventive care refers to visits with your health care provider and lifestyle choices that can promote health and wellness. This includes:  A yearly physical exam. This may also be called an annual well check.  Regular dental visits and eye exams.  Immunizations.  Screening for certain conditions.  Healthy lifestyle choices, such as eating a healthy diet, getting regular exercise, not using drugs or products that contain nicotine and tobacco, and limiting alcohol use. What can I expect for my preventive care visit? Physical exam Your health care provider will check your:  Height and weight. This may be used to calculate body mass index (BMI), which tells if you are at a healthy weight.  Heart rate and blood pressure.  Skin for abnormal spots. Counseling Your health care provider may ask you questions about your:  Alcohol, tobacco, and drug use.  Emotional well-being.  Home and relationship well-being.  Sexual activity.  Eating habits.  Work and work Statistician.  Method of birth control.  Menstrual cycle.  Pregnancy history. What immunizations do I need?  Influenza (flu) vaccine  This is recommended every year. Tetanus, diphtheria, and pertussis (Tdap) vaccine  You may need a Td booster every 10 years. Varicella (chickenpox) vaccine  You may need this if you have not been vaccinated.  Zoster (shingles) vaccine  You may need this after age 57. Measles, mumps, and rubella (MMR) vaccine  You may need at least one dose of MMR if you were born in 1957 or later. You may also need a second dose. Pneumococcal conjugate (PCV13) vaccine  You may need this if you have certain conditions and were not previously vaccinated. Pneumococcal polysaccharide (PPSV23) vaccine  You may need one or two doses if you smoke cigarettes or if you have certain conditions. Meningococcal conjugate (MenACWY) vaccine  You may need this if you have certain conditions. Hepatitis A vaccine  You may need this if you have certain conditions or if you travel or work in places where you may be exposed to hepatitis A. Hepatitis B vaccine  You may need this if you have certain conditions or if you travel or work in places where you may be exposed to hepatitis B. Haemophilus influenzae type b (Hib) vaccine  You may need this if you have certain conditions. Human papillomavirus (HPV) vaccine  If recommended by your health care provider, you may need three doses over 6 months. You may receive vaccines as individual doses or as more than one vaccine together in one shot (combination vaccines). Talk with your health care provider about the risks and benefits of combination vaccines. What tests do I need? Blood tests  Lipid and cholesterol levels. These may be checked every 5 years, or more frequently if you are over 64 years old.  Hepatitis C test.  Hepatitis B test. Screening  Lung cancer screening. You may have this screening every  year starting at age 57 if you have a 30-pack-year history of smoking and currently smoke or have quit within the past 15 years.  Colorectal cancer screening. All adults should have this screening starting at age 57 and continuing until age 57. Your health care provider may recommend screening at age 40 if you are at increased risk. You will have tests every 1-10 years,  depending on your results and the type of screening test.  Diabetes screening. This is done by checking your blood sugar (glucose) after you have not eaten for a while (fasting). You may have this done every 1-3 years.  Mammogram. This may be done every 1-2 years. Talk with your health care provider about when you should start having regular mammograms. This may depend on whether you have a family history of breast cancer.  BRCA-related cancer screening. This may be done if you have a family history of breast, ovarian, tubal, or peritoneal cancers.  Pelvic exam and Pap test. This may be done every 3 years starting at age 57. Starting at age 57, this may be done every 5 years if you have a Pap test in combination with an HPV test. Other tests  Sexually transmitted disease (STD) testing.  Bone density scan. This is done to screen for osteoporosis. You may have this scan if you are at high risk for osteoporosis. Follow these instructions at home: Eating and drinking  Eat a diet that includes fresh fruits and vegetables, whole grains, lean protein, and low-fat dairy.  Take vitamin and mineral supplements as recommended by your health care provider.  Do not drink alcohol if: ? Your health care provider tells you not to drink. ? You are pregnant, may be pregnant, or are planning to become pregnant.  If you drink alcohol: ? Limit how much you have to 0-1 drink a day. ? Be aware of how much alcohol is in your drink. In the [place] one drink equals one 12 oz bottle of beer (355 mL), one 5 oz glass of wine (148 mL), or one 1 oz glass of hard liquor (44 mL). Lifestyle  Take daily care of your teeth and gums.  Stay active. Exercise for at least 30 minutes on 5 or more days each week.  Do not use any products that contain nicotine or tobacco, such as cigarettes, e-cigarettes, and chewing tobacco. If you need help quitting, ask your health care provider.  If you are sexually active, practice  safe sex. Use a condom or other form of birth control (contraception) in order to prevent pregnancy and STIs (sexually transmitted infections).  If told by your health care provider, take low-dose aspirin daily starting at age 12. What's next?  Visit your health care provider once a year for a well check visit.  Ask your health care provider how often you should have your eyes and teeth checked.  Stay up to date on all vaccines. This information is not intended to replace advice given to you by your health care provider. Make sure you discuss any questions you have with your health care provider. Document Released: 12/23/2015 Document Revised: 08/07/2018 Document Reviewed: 08/07/2018 Elsevier Patient Education  2020 [employer].

## 2019-09-30 ENCOUNTER — Telehealth: Payer: Self-pay | Admitting: *Deleted

## 2019-09-30 ENCOUNTER — Other Ambulatory Visit: Payer: Self-pay | Admitting: *Deleted

## 2019-09-30 DIAGNOSIS — E890 Postprocedural hypothyroidism: Secondary | ICD-10-CM

## 2019-09-30 DIAGNOSIS — E038 Other specified hypothyroidism: Secondary | ICD-10-CM

## 2019-09-30 MED ORDER — LEVOTHYROXINE SODIUM 75 MCG PO TABS: 75.0000 ug | ORAL_TABLET | Freq: Every day | ORAL | 0 refills | Status: DC

## 2019-09-30 NOTE — Telephone Encounter (Signed)
Please refer as requested. Hypothyroidism. Thanks. And can cancel f/u lab visit with [place] then.

## 2019-09-30 NOTE — Addendum Note (Signed)
Addended by: Billey Chang on: 09/30/2019 12:09 PM   Modules accepted: Orders

## 2019-09-30 NOTE — Telephone Encounter (Signed)
Pt ask if she can get referral to Endo - Fredderick Phenix, MD

## 2019-09-30 NOTE — Addendum Note (Signed)
Addended by: Layla Barter on: 09/30/2019 02:40 PM   Modules accepted: Orders

## 2019-10-01 ENCOUNTER — Encounter: Payer: Self-pay | Admitting: *Deleted

## 2019-10-02 DIAGNOSIS — Z1159 Encounter for screening for other viral diseases: Secondary | ICD-10-CM | POA: Diagnosis not present

## 2019-10-02 DIAGNOSIS — J209 Acute bronchitis, unspecified: Secondary | ICD-10-CM | POA: Diagnosis not present

## 2019-10-05 ENCOUNTER — Other Ambulatory Visit: Payer: Self-pay | Admitting: *Deleted

## 2019-10-05 MED ORDER — ALBUTEROL SULFATE HFA 108 (90 BASE) MCG/ACT IN AERS: 2.0000 | INHALATION_SPRAY | Freq: Four times a day (QID) | RESPIRATORY_TRACT | 2 refills | Status: DC | PRN

## 2019-10-08 ENCOUNTER — Other Ambulatory Visit: Payer: Self-pay | Admitting: Family Medicine

## 2019-10-08 DIAGNOSIS — N6320 Unspecified lump in the left breast, unspecified quadrant: Secondary | ICD-10-CM

## 2019-10-20 ENCOUNTER — Telehealth: Payer: Self-pay

## 2019-10-20 NOTE — Telephone Encounter (Signed)
Patient called in wanting to know if she could get her labs drawn before the visit so she cam discuss at the visit     Please advise

## 2019-10-21 NOTE — Telephone Encounter (Signed)
Please advise 

## 2019-10-21 NOTE — Telephone Encounter (Signed)
sge has new pt apt on 11/03/19 with you

## 2019-11-03 ENCOUNTER — Other Ambulatory Visit: Payer: Self-pay

## 2019-11-03 ENCOUNTER — Encounter: Payer: Self-pay | Admitting: Internal Medicine

## 2019-11-03 ENCOUNTER — Ambulatory Visit (INDEPENDENT_AMBULATORY_CARE_PROVIDER_SITE_OTHER): Payer: 59 | Admitting: Internal Medicine

## 2019-11-03 VITALS — BP 122/78 | HR 73 | Temp 98.9°F | Ht 62.0 in | Wt 222.4 lb

## 2019-11-03 DIAGNOSIS — E890 Postprocedural hypothyroidism: Secondary | ICD-10-CM | POA: Diagnosis not present

## 2019-11-03 NOTE — Patient Instructions (Signed)

## 2019-11-03 NOTE — Progress Notes (Signed)
Name: Alison [name]  MRN/ DOB: [id number], Aug 01, 1961    Age/ Sex: 58 y.o., female    PCP: Leamon Arnt, MD   Reason for Endocrinology Evaluation: Hypothyroidism      Date of Initial Endocrinology Evaluation: 11/03/2019     HPI: Alison [name] is a 58 y.o. female with a past medical history of HTN, Hypothyroidism , Hx of gastric banding and dyslipidemia . The patient presented for initial endocrinology clinic visit on 11/03/2019 for consultative assistance with her hypothyroidism .    Pt is S/P RAI ablation approximately 15 yrs ago secondary to hyperthyroidism. She does not recall a diagnosis of graves' disease. Shortly after RAI ablation , she was started on LT- 4 replacement.   Over the past few months she has been noted to have a low TSH, with gradual reduction in her LT-4 replacement dose.  She is currently on Levothyroxine 75 mcg daily since 09/30/2019   She S/P lap band in 2015 - lost close to 60 lbs but has gained some of the weight back, today she was c/o weight  Gain or inability to lose weight etc. She denies hyperthyroid symptoms such as palpitations, diarrhea or tremors.   She is not on biotin  She is a Marine scientist.   Mother with hyperthyroidism    HISTORY:  Past Medical History:  Past Medical History:  Diagnosis Date  . Anemia    prior to hysterectomy   . Anxiety   . Asthma   . Cervical myelopathy ([place]) 08/28/2018  . Heart murmur   . Hx-TIA (transient ischemic attack) 1992  . Hyperlipidemia   . Hypertension   . Hypothyroidism   . Obesity   . Polycystic ovarian syndrome    Past Surgical History:  Past Surgical History:  Procedure Laterality Date  . ABDOMINAL HYSTERECTOMY  2009   Partial  . ANTERIOR CERVICAL DECOMPRESSION/DISCECTOMY FUSION 4 LEVELS N/A 08/28/2018   Procedure: ANTERIOR CERVICAL DECOMPRESSION FUSION, CERVICAL 4-5, CERVICAL 5-6, CERVICAL 6-7 WITH INSTRUMENTATION AND ALLOGRAFT;  Surgeon: Phylliss Bob, MD;  Location: [facility];  Service:  Orthopedics;  Laterality: N/A;  . FEMUR FRACTURE SURGERY  2008   Left  . I&D EXTREMITY Left 06/01/2018   Procedure: IRRIGATION AND DEBRIDEMENT LEFT CALF;  Surgeon: Mcarthur Rossetti, MD;  Location: [facility];  Service: Orthopedics;  Laterality: Left;  . KNEE CARTILAGE SURGERY  2005  . LAPAROSCOPIC GASTRIC BANDING  11/12/2011   Procedure: LAPAROSCOPIC GASTRIC BANDING;  Surgeon: Judieth Keens, DO;  Location: [facility];  Service: General;  Laterality: N/A;  nathanson liver retractor/ endostitch available 2-0 surgidac refills/ layton needle drivers Economist to Pensions consultant  . TONSILLECTOMY  2007      Social History:  reports that she has never smoked. She has never used smokeless tobacco. She reports current alcohol use. She reports that she does not use drugs.  Family History: family history includes Anesthesia problems in her sister; Breast cancer (age of onset: 73) in her maternal aunt; Cancer in her maternal aunt; Glaucoma in her mother; Heart disease in her father; Migraines in her sister; Myasthenia gravis in her paternal grandfather; Ovarian cancer in her maternal grandmother; Stroke in her father; Uterine cancer in her maternal grandmother.   HOME MEDICATIONS: Allergies as of 11/03/2019      Reactions   Aspirin Anaphylaxis   Cherry Hives, Shortness Of Breath   Other Hives, Itching   Walnuts and pecans - cause itching and hives in mouth   Soy  Allergy Other (See Comments)   " hives in mouth and wheezing"    Penicillins Itching   Has patient had a PCN reaction causing immediate rash, facial/tongue/throat swelling, SOB or lightheadedness with hypotension: No Has patient had a PCN reaction causing severe rash involving mucus membranes or skin necrosis: No Has patient had a PCN reaction that required hospitalization: No Has patient had a PCN reaction occurring within the last 10 years: No If all of the above answers are "NO", then may proceed with Cephalosporin use.       Medication List       Accurate as of November 03, 2019  9:43 AM. If you have any questions, ask your nurse or doctor.        albuterol 108 (90 Base) MCG/ACT inhaler Commonly known as: VENTOLIN HFA Inhale 2 puffs into the lungs every 6 (six) hours as needed. For asthma   ALPRAZolam 0.25 MG tablet Commonly known as: XANAX Take 1 tablet (0.25 mg total) by mouth at bedtime as needed for anxiety or sleep.   amLODipine 5 MG tablet Commonly known as: NORVASC Take 1 tablet (5 mg total) by mouth daily.   cetirizine 10 MG tablet Commonly known as: ZYRTEC Take 10 mg by mouth at bedtime as needed (itching).   diphenhydrAMINE 2 % cream Commonly known as: BENADRYL Apply 1 application topically daily as needed for itching.   escitalopram 10 MG tablet Commonly known as: LEXAPRO TAKE 1 TABLET(10 MG) BY MOUTH DAILY   hydrochlorothiazide 25 MG tablet Commonly known as: HYDRODIURIL Take 1 tablet (25 mg total) by mouth daily.   levothyroxine 75 MCG tablet Commonly known as: SYNTHROID Take 1 tablet (75 mcg total) by mouth daily.   pantoprazole 20 MG tablet Commonly known as: Protonix Take 1 tablet (20 mg total) by mouth 2 (two) times daily.   rOPINIRole 3 MG tablet Commonly known as: REQUIP Take 1 tablet (3 mg total) by mouth at bedtime.   rosuvastatin 40 MG tablet Commonly known as: CRESTOR Take 1 tablet (40 mg total) by mouth at bedtime.   traZODone 50 MG tablet Commonly known as: DESYREL Take 1-2 tablets (50-100 mg total) by mouth at bedtime as needed for sleep.         REVIEW OF SYSTEMS: A comprehensive ROS was conducted with the patient and is negative except as per HPI and below:  Review of Systems  Constitutional: Negative for chills and fever.  HENT: Negative for congestion and sore throat.   Eyes: Negative for blurred vision and double vision.  Respiratory: Negative for cough and shortness of breath.   Cardiovascular: Negative for chest pain and palpitations.   Gastrointestinal: Negative for diarrhea and nausea.  Neurological: Negative for tingling and tremors.       OBJECTIVE:  VS: BP 122/78 (BP Location: Right Arm, Patient Position: Sitting, Cuff Size: Large)   Pulse 73   Temp 98.9 F (37.2 C)   Ht 5\' 2"  (1.575 m)   Wt 222 lb 6.4 oz (100.9 kg)   SpO2 97%   BMI 40.68 kg/m    Wt Readings from Last 3 Encounters:  11/03/19 222 lb 6.4 oz (100.9 kg)  09/29/19 226 lb 3.2 oz (102.6 kg)  05/27/19 235 lb 3.2 oz (106.7 kg)     EXAM: General: Pt appears well and is in NAD  Eyes: External eye exam normal without stare, lid lag or exophthalmos.  EOM intact.  PERRL.  Neck: General: Supple without adenopathy. Thyroid: No goiter, or  nodules appreciated. .  Lungs: Clear with good BS bilat with no rales, rhonchi, or wheezes  Heart: Auscultation: RRR.  Abdomen: Normoactive bowel sounds, soft, nontender, without masses or organomegaly palpable  Extremities:  BL LE: No pretibial edema normal ROM and strength.  Skin: Hair: Texture and amount normal with gender appropriate distribution Skin Inspection: No rashes Skin Palpation: Skin temperature, texture, and thickness normal to palpation  Neuro: Cranial nerves: II - XII grossly intact  Motor: Normal strength throughout DTRs: 2+ and symmetric in UE without delay in relaxation phase  Mental Status: Judgment, insight: Intact Orientation: Oriented to time, place, and person Mood and affect: No depression, anxiety, or agitation     DATA REVIEWED:   Results for TRISTYNN, COUPAL (MRN [id number]) as of 11/03/2019 09:55  Ref. Range 09/24/2018 09:09 05/27/2019 09:54 07/21/2019 08:26 09/29/2019 09:56  TSH Latest Ref Range: 0.35 - 4.50 uIU/mL 13.03 (H) 0.02 (L) 0.01 (L) 0.03 (L)    ASSESSMENT/PLAN/RECOMMENDATIONS:   1. Postablative Hypothyroidism :   - Clinically she is euthyroid  - No local neck symptoms  - She has been on 125 mcg of levothyroxine that has been gradually reduced, she has been on the  current dose for ~ 4 weeks. Its too soon to check today , will recheck in 2 weeks  - Pt educated extensively on the correct way to take levothyroxine (first thing in the morning with water, 30 minutes before eating or taking other medications). - Pt encouraged to double dose the following day if she were to miss a dose given long half-life of levothyroxine. - I suspect the reason for her hyperthyroidism 15 yrs ago was graves' disease, given her age and FH of hyperthyroidism. Even though its rare but not unheard of , some people with graves' disease may have reactivation decades after initial treatment and will have to keep that in mind.    Medications : Levothyroxine 75 mcg daily     F/U in 3 months  Signed electronically by: Mack Guise, MD  [facility] Endocrinology  [facility] [address] [place], [place] [address] Phone: [phone] FAX: [phone]   CC: Leamon Arnt, [place] [address] Phone: [phone] Fax: [phone]   Return to Endocrinology clinic as below: Future Appointments  Date Time Provider [place]  11/17/2019  9:30 AM [facility] LAB [facility] None  12/31/2019  8:20 AM GI-[facility]G DIAG [place] 1 GI-BCGMM GI-BREAST CE  12/31/2019  8:30 AM GI-[facility]G [place] 1 GI-BCGUS GI-BREAST CE  02/02/2020 10:10 AM Solomon Skowronek, Melanie Crazier, MD [facility] None  10/11/2020  9:20 AM Leamon Arnt, MD [facility]PC-HPC PEC

## 2019-11-17 ENCOUNTER — Other Ambulatory Visit (INDEPENDENT_AMBULATORY_CARE_PROVIDER_SITE_OTHER): Payer: 59

## 2019-11-17 ENCOUNTER — Other Ambulatory Visit: Payer: Self-pay

## 2019-11-17 DIAGNOSIS — E890 Postprocedural hypothyroidism: Secondary | ICD-10-CM

## 2019-11-17 LAB — T4, FREE: Free T4: 0.82 ng/dL (ref 0.60–1.60)

## 2019-11-17 LAB — TSH: TSH: 0.2 u[IU]/mL — ABNORMAL LOW (ref 0.35–4.50)

## 2019-12-26 ENCOUNTER — Other Ambulatory Visit: Payer: Self-pay | Admitting: Family Medicine

## 2019-12-31 ENCOUNTER — Other Ambulatory Visit: Payer: 59

## 2020-02-01 ENCOUNTER — Other Ambulatory Visit: Payer: Self-pay

## 2020-02-02 ENCOUNTER — Encounter: Payer: Self-pay | Admitting: Internal Medicine

## 2020-02-02 ENCOUNTER — Ambulatory Visit (INDEPENDENT_AMBULATORY_CARE_PROVIDER_SITE_OTHER): Payer: 59 | Admitting: Internal Medicine

## 2020-02-02 ENCOUNTER — Ambulatory Visit: Payer: 59 | Admitting: Internal Medicine

## 2020-02-02 VITALS — BP 118/78 | HR 73 | Temp 98.7°F | Ht 62.0 in | Wt 231.6 lb

## 2020-02-02 DIAGNOSIS — E890 Postprocedural hypothyroidism: Secondary | ICD-10-CM

## 2020-02-02 NOTE — Progress Notes (Signed)
Name: Alison [name]  MRN/ DOB: [id number], 03/02/1961    Age/ Sex: 59 y.o., female     PCP: Leamon Arnt, MD   Reason for Endocrinology Evaluation:  Post ablative hypothyroidism     Initial Endocrinology Clinic Visit: 11/03/2019    PATIENT IDENTIFIER: Alison [name] is a 58 y.o., female with a past medical history of HTN, hx of gastric banding, hx of hyperthyroidism S/P RAI  ablation and dyslipidemia. She has followed with [facility] since 11/03/2019 for consultative assistance with management of her post ablative hypothyroidism.   HISTORICAL SUMMARY: The patient was first diagnosed with hyperthyroidism, she is s/p RAI ablation over 15 years ago.  She does not recall a diagnosis of Graves' disease.  She required LT-4 replacement therapy shortly after RAI ablation.   Sometime in 2020 she has been noted to have low TSH, requiring gradual reduction in LT-4 replacement.   Mother with hyperthyroidism  She works as a Marine scientist SUBJECTIVE:   During last visit (11/03/2019): We continue levothyroxine  Today (02/02/2020):  Alison [name] is here for a 65-month follow-up on post ablative hypothyroidism.  She is on levothyroxine 75 mcg Mon- Sat.  Denies fatigue  Denies diarrhea or palpitations Denies depression     ROS:  As per HPI.   HISTORY:  Past Medical History:  Past Medical History:  Diagnosis Date  . Anemia    prior to hysterectomy   . Anxiety   . Asthma   . Cervical myelopathy ([place]) 08/28/2018  . Heart murmur   . Hx-TIA (transient ischemic attack) 1992  . Hyperlipidemia   . Hypertension   . Hypothyroidism   . Obesity   . Polycystic ovarian syndrome     Past Surgical History:  Past Surgical History:  Procedure Laterality Date  . ABDOMINAL HYSTERECTOMY  2009   Partial  . ANTERIOR CERVICAL DECOMPRESSION/DISCECTOMY FUSION 4 LEVELS N/A 08/28/2018   Procedure: ANTERIOR CERVICAL DECOMPRESSION FUSION, CERVICAL 4-5, CERVICAL 5-6, CERVICAL 6-7 WITH  INSTRUMENTATION AND ALLOGRAFT;  Surgeon: Phylliss Bob, MD;  Location: [facility];  Service: Orthopedics;  Laterality: N/A;  . FEMUR FRACTURE SURGERY  2008   Left  . I & D EXTREMITY Left 06/01/2018   Procedure: IRRIGATION AND DEBRIDEMENT LEFT CALF;  Surgeon: Mcarthur Rossetti, MD;  Location: [facility];  Service: Orthopedics;  Laterality: Left;  . KNEE CARTILAGE SURGERY  2005  . LAPAROSCOPIC GASTRIC BANDING  11/12/2011   Procedure: LAPAROSCOPIC GASTRIC BANDING;  Surgeon: Judieth Keens, DO;  Location: [facility];  Service: General;  Laterality: N/A;  nathanson liver retractor/ endostitch available 2-0 surgidac refills/ layton needle drivers Economist to Pensions consultant  . TONSILLECTOMY  2007     Social History:  reports that she has never smoked. She has never used smokeless tobacco. She reports current alcohol use. She reports that she does not use drugs. Family History:  Family History  Problem Relation Age of Onset  . Glaucoma Mother   . Hyperthyroidism Mother   . Heart disease Father   . Stroke Father   . Anesthesia problems Sister   . Migraines Sister   . Cancer Maternal Aunt        Breast Cancer  . Breast cancer Maternal Aunt 66  . Ovarian cancer Maternal Grandmother   . Uterine cancer Maternal Grandmother   . Myasthenia gravis Paternal Grandfather       HOME MEDICATIONS: Allergies as of 02/02/2020      Reactions   Aspirin Anaphylaxis  Cherry Hives, Shortness Of Breath   Other Hives, Itching   Walnuts and pecans - cause itching and hives in mouth   Soy Allergy Other (See Comments)   " hives in mouth and wheezing"    Penicillins Itching   Has patient had a PCN reaction causing immediate rash, facial/tongue/throat swelling, SOB or lightheadedness with hypotension: No Has patient had a PCN reaction causing severe rash involving mucus membranes or skin necrosis: No Has patient had a PCN reaction that required hospitalization: No Has patient had a PCN reaction  occurring within the last 10 years: No If all of the above answers are "NO", then may proceed with Cephalosporin use.      Medication List       Accurate as of February 02, 2020  8:28 AM. If you have any questions, ask your nurse or doctor.        albuterol 108 (90 Base) MCG/ACT inhaler Commonly known as: VENTOLIN HFA Inhale 2 puffs into the lungs every 6 (six) hours as needed. For asthma   ALPRAZolam 0.25 MG tablet Commonly known as: XANAX Take 1 tablet (0.25 mg total) by mouth at bedtime as needed for anxiety or sleep.   amLODipine 5 MG tablet Commonly known as: NORVASC Take 1 tablet (5 mg total) by mouth daily.   cetirizine 10 MG tablet Commonly known as: ZYRTEC Take 10 mg by mouth at bedtime as needed (itching).   diphenhydrAMINE 2 % cream Commonly known as: BENADRYL Apply 1 application topically daily as needed for itching.   escitalopram 10 MG tablet Commonly known as: LEXAPRO TAKE 1 TABLET(10 MG) BY MOUTH DAILY   hydrochlorothiazide 25 MG tablet Commonly known as: HYDRODIURIL Take 1 tablet (25 mg total) by mouth daily.   levothyroxine 75 MCG tablet Commonly known as: SYNTHROID TAKE 1 TABLET BY MOUTH EVERY DAY   pantoprazole 20 MG tablet Commonly known as: Protonix Take 1 tablet (20 mg total) by mouth 2 (two) times daily.   rOPINIRole 3 MG tablet Commonly known as: REQUIP Take 1 tablet (3 mg total) by mouth at bedtime.   rosuvastatin 40 MG tablet Commonly known as: CRESTOR Take 1 tablet (40 mg total) by mouth at bedtime.   traZODone 50 MG tablet Commonly known as: DESYREL Take 1-2 tablets (50-100 mg total) by mouth at bedtime as needed for sleep.         OBJECTIVE:   PHYSICAL EXAM: VS: There were no vitals taken for this visit.   EXAM: General: Pt appears well and is in NAD  Hydration: Well-hydrated with moist mucous membranes and good skin turgor  Eyes: External eye exam normal without stare, lid lag or exophthalmos.  EOM intact.  PERRL.    Ears, Nose, Throat: Hearing: Grossly intact bilaterally Dental: Good dentition  Throat: Clear without mass, erythema or exudate  Neck: General: Supple without adenopathy. Thyroid: Thyroid size normal.  No goiter or nodules appreciated. No thyroid bruit.  Lungs: Clear with good BS bilat with no rales, rhonchi, or wheezes  Heart: Auscultation: RRR.  Abdomen: Normoactive bowel sounds, soft, nontender, without masses or organomegaly palpable  Extremities: Gait and station: Normal gait  Digits and nails: No clubbing, cyanosis, petechiae, or nodes Head and neck: Normal alignment and mobility BL UE: Normal ROM and strength. BL LE: No pretibial edema normal ROM and strength.  Skin: Hair: Texture and amount normal with gender appropriate distribution Skin Inspection: No rashes, acanthosis nigricans/skin tags. No lipohypertrophy Skin Palpation: Skin temperature, texture, and thickness normal to  palpation  Neuro: Cranial nerves: II - XII grossly intact  Cerebellar: Normal coordination and movement; no tremor Motor: Normal strength throughout DTRs: 2+ and symmetric in UE without delay in relaxation phase  Mental Status: Judgment, insight: Intact Orientation: Oriented to time, place, and person Memory: Intact for recent and remote events Mood and affect: No depression, anxiety, or agitation     DATA REVIEWED:   Results for Alison [name], Alison [name] (MRN [id number]) as of 02/03/2020 13:04  Ref. Range 02/02/2020 16:10  TSH Latest Ref Range: 0.35 - 4.50 uIU/mL 2.48  T4,Free(Direct) Latest Ref Range: 0.60 - 1.60 ng/dL 0.72    ASSESSMENT / PLAN / RECOMMENDATIONS:   1.Postablative Hypothyroidism :   -Patient is clinically euthyroid -No local neck symptoms -Repeat TFTs today are normal the current dose of L T4 replacement is appropriate, given the fact that her levothyroxine dose has been gradually reduced from 125 mcg to an average of 64 mcg daily, is an indication of endogenous thyroid hormone secretion,  which is not an heard of especially after decades following treatment.  Medications   Continue levothyroxine 75 MCG Monday through Saturday   Follow-up in 6 months Labs in 8 weeks   Signed electronically by: Mack Guise, MD  [facility] Endocrin[facility]  [address] [place],  [address] Phone: [phone] FAX: [phone]      CC: Leamon Arnt, [place] [address] Phone: [phone]  Fax: [phone]   Return to Endocrinology clinic as below: Future Appointments  Date Time Provider [place]  02/02/2020  3:40 PM Burr Soffer, Melanie Crazier, MD [facility] None  10/11/2020  9:30 AM Leamon Arnt, MD [facility] P[facility]

## 2020-02-02 NOTE — Patient Instructions (Signed)

## 2020-02-03 ENCOUNTER — Encounter: Payer: Self-pay | Admitting: Internal Medicine

## 2020-02-03 DIAGNOSIS — R1310 Dysphagia, unspecified: Secondary | ICD-10-CM | POA: Diagnosis not present

## 2020-02-03 DIAGNOSIS — I10 Essential (primary) hypertension: Secondary | ICD-10-CM | POA: Diagnosis not present

## 2020-02-03 DIAGNOSIS — E890 Postprocedural hypothyroidism: Secondary | ICD-10-CM | POA: Insufficient documentation

## 2020-02-03 LAB — T4, FREE: Free T4: 0.72 ng/dL (ref 0.60–1.60)

## 2020-02-03 LAB — TSH: TSH: 2.48 u[IU]/mL (ref 0.35–4.50)

## 2020-02-03 MED ORDER — LEVOTHYROXINE SODIUM 75 MCG PO TABS: 75.0000 ug | ORAL_TABLET | Freq: Every day | ORAL | 3 refills | Status: DC

## 2020-03-01 ENCOUNTER — Encounter: Payer: Self-pay | Admitting: Dietician

## 2020-03-01 ENCOUNTER — Encounter: Payer: 59 | Attending: General Surgery | Admitting: Dietician

## 2020-03-01 ENCOUNTER — Other Ambulatory Visit: Payer: Self-pay

## 2020-03-01 DIAGNOSIS — E669 Obesity, unspecified: Secondary | ICD-10-CM | POA: Diagnosis not present

## 2020-03-01 NOTE — Progress Notes (Signed)
Nutrition Assessment for Bariatric Surgery Medical Nutrition Therapy   Patient was seen on 03/01/2020 for Pre-Operative Nutrition Assessment. Letter of approval faxed to [facility] Surgery bariatric surgery program coordinator on 03/02/2020.   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 Typical meal pattern is 3 meals per day. States she eats smaller portions from having LapBand and is able to identify certain foods that do not sit well. Would like to get back to meal planning and having better food options at home to have throughout the week, has had success with this before. May have a Premier Protein shake, [employer] fish sandwich, or fast food for meals. Avoids fries, thick crust pizza, large amounts of bread/starches.   24-Hr Dietary Recall First Meal: 1 cup Cheerios, 2% milk Snack: - Second Meal: Pita Delite greek salad  Snack: - Third Meal: baked spaghetti  Snack: ice cream Beverages: water, Coke Zero   NUTRITION DIAGNOSIS  Overweight/obesity ([place]-3.3) related to past poor dietary habits and physical inactivity as evidenced by patient w/ planned LapBand to Sleeve Gastrectomy surgery following dietary guidelines for continued weight loss.    NUTRITION INTERVENTION  Nutrition counseling (C-1) and education (E-2) to facilitate bariatric surgery goals.  Pre-Op Goals Reviewed with the Patient . Track food and beverage intake (pen and paper, MyFitness Pal, Baritastic app, etc.) . Make healthy food choices while monitoring portion sizes . Consume 3 meals per day or try to eat every 3-5 hours . Avoid concentrated sugars and fried foods . Keep sugar & fat in the single digits per  serving on food labels . Practice CHEWING your food (aim for applesauce consistency) . Practice not drinking 15 minutes before, during, and 30 minutes after each meal and snack . Avoid all carbonated beverages (ex: soda, sparkling beverages)  . Limit caffeinated beverages (ex: coffee, tea, energy drinks) . Avoid all sugar-sweetened beverages (ex: regular soda, sports drinks)  . Avoid alcohol  . Aim for 64-100 ounces of FLUID daily (with at least half of fluid intake being plain water)  . Aim for at least 60-80 grams of PROTEIN daily . Look for a liquid protein source that contains ?15 g protein and ?5 g carbohydrate (ex: shakes, drinks, shots) . Make a list of non-food related activities . Physical activity is an important part of a healthy lifestyle so keep it moving! The goal is to reach 150 minutes of exercise per week, including cardiovascular and weight baring activity.  Handouts Provided Include  . Bariatric Surgery handouts (Nutrition Visits, Pre-Op Goals, Protein Shakes, Vitamins & Minerals) . Meal Ideas  Learning Style & Readiness for Change Teaching method utilized: Visual & Auditory  Demonstrated degree of understanding via: Teach Back  Barriers to learning/adherence to lifestyle change: None Identified    MONITORING & EVALUATION Dietary intake, weekly physical activity, body weight, and pre-op goals reached at next nutrition visit.   Next Steps Patient is to return to [facility] in 1 month for 8th SWL Visit.

## 2020-03-02 ENCOUNTER — Telehealth: Payer: Self-pay

## 2020-03-02 DIAGNOSIS — E669 Obesity, unspecified: Secondary | ICD-10-CM | POA: Insufficient documentation

## 2020-03-02 NOTE — Telephone Encounter (Signed)
Weight loss form signed and completed. Placed at the front desk for patient to pick up. Called patient to notify. Copy scanned in chart.

## 2020-03-02 NOTE — Patient Instructions (Signed)
Use the handouts from today to help write out your grocery list. Try practicing meal prepping and making a few meals at a time so that you have them ready at the house throughout the week.

## 2020-03-08 ENCOUNTER — Encounter: Payer: 59 | Admitting: Skilled Nursing Facility1

## 2020-03-08 ENCOUNTER — Other Ambulatory Visit: Payer: Self-pay

## 2020-03-08 DIAGNOSIS — E669 Obesity, unspecified: Secondary | ICD-10-CM

## 2020-03-08 NOTE — Progress Notes (Signed)
Supervised Weight Loss Visit Bariatric Nutrition Education  Planned Surgery: Gastric Band to Sleeve Pt Expectation of Surgery/ Goals: to lose weight  8th out of 12 SWL Appointments   Referral stated Supervised Weight Loss (SWL) visits needed: 12* *only needs 5 more  Planned surgery: LapBand to Sleeve Pt expectation of surgery: to lose weight  NUTRITION ASSESSMENT  Anthropometrics  Start weight at [facility]: 234.5 lbs (date: 03/01/2020) Today's weight: 232.6 lbs Weight change: -2 lbs (since previous visit) BMI: 42.54 kg/m2     Clinical  Medical Hx: GAD Medications: see  list Labs:  Notable Signs/Symptoms:   Lifestyle & Dietary Hx  10-2 and 6-10 on a 24 hr nurse line stating she has always had sleep issues but is working on getting it better. Pt states she had planned on pre making her meals but was unable to do this. Pt states she does not like a lot of non starchy vegetables. Pt states she is experiencing GERD due to the band. Pt states when she has not eaten her stomach hurts. Pt states cereal make her hungry an hour later. Pt states she does not like to cook.   Estimated daily fluid intake: 67.6 oz Supplements:  Current average weekly physical activity: attempting to walk 5 days a week for 20 minutes (hit 4)  24-Hr Dietary Recall First Meal 9:30am: skipped Snack:  Second Meal 3pm: tuna and crackers Snack:  Third Meal 10pm: spaghetti or baked chicken wings with mashed potatoes sometimes with a salad  Snack: cookies or ice cream  Beverages: 67.6 oz  Estimated Energy Needs Calories: 1500 Carbohydrate: 170g Protein: 112g Fat: 42g   NUTRITION DIAGNOSIS  Overweight/obesity ([place]-3.3) related to past poor dietary habits and physical inactivity as evidenced by patient w/ planned band to sleeve surgery following dietary guidelines for continued weight loss.   NUTRITION INTERVENTION  Nutrition counseling (C-1) and education (E-2) to facilitate bariatric surgery  goals.  Pre-Op Goals Progress & New Goals . Be sure to eat breakfast . Split dinner in half having some before work and the rest after work . The second half of dinner is the last thing going in your mouth before bed . Try meatless crumbles with brown rice and cheese for a breakfast . Try Kodiak oatmeal for breakfast . Use Diabetes food hub for recipes  Handouts Provided Include     Learning Style & Readiness for Change Teaching method utilized: Visual & Auditory  Demonstrated degree of understanding via: Teach Back  Barriers to learning/adherence to lifestyle change: none stated  RD's Notes for next Visit  . Assess pts progress towards goals   MONITORING & EVALUATION Dietary intake, weekly physical activity, body weight, and pre-op goals in 1 month.   Next Steps  Patient is to return to [facility]

## 2020-03-10 ENCOUNTER — Ambulatory Visit (INDEPENDENT_AMBULATORY_CARE_PROVIDER_SITE_OTHER): Payer: 59 | Admitting: Psychology

## 2020-03-10 DIAGNOSIS — F509 Eating disorder, unspecified: Secondary | ICD-10-CM | POA: Diagnosis not present

## 2020-03-10 DIAGNOSIS — R69 Illness, unspecified: Secondary | ICD-10-CM | POA: Diagnosis not present

## 2020-03-16 ENCOUNTER — Other Ambulatory Visit: Payer: Self-pay

## 2020-03-16 ENCOUNTER — Encounter: Payer: 59 | Attending: General Surgery | Admitting: Dietician

## 2020-03-16 ENCOUNTER — Encounter: Payer: Self-pay | Admitting: Dietician

## 2020-03-16 DIAGNOSIS — E669 Obesity, unspecified: Secondary | ICD-10-CM | POA: Diagnosis not present

## 2020-03-16 NOTE — Progress Notes (Signed)
Supervised Weight Loss Visit Bariatric Nutrition Education  Planned Surgery: Gastric Band to Sleeve Pt Expectation of Surgery/ Goals: to lose weight  9th out of 12 SWL Appointments   Referral stated Supervised Weight Loss (SWL) visits needed: 12* *only needs 5 more after assessment   Planned surgery: LapBand to Sleeve Pt expectation of surgery: to lose weight   NUTRITION ASSESSMENT  Anthropometrics  Start weight at [facility]: 234.5 lbs (date: 03/01/2020) Today's weight: 231.4 lbs BMI: 42.3 kg/m2    Lifestyle & Dietary Hx Patient tried the [employer] however states she is allergic to the soy in it, so she plans to make her own. Work schedule is 10am to 2pm. States she has purchased groceries including vegetables, but has not made her recipes yet and plans to [employer]. Typical meal pattern is still only 2 meals per day. We discussed having a small snack after work, or splitting a regular meal in half to have half for lunch and the other half for dinner. Staying physically active and drinking lots of water.   Estimated daily fluid intake: 64 oz water  Current average weekly physical activity: attempting to walk 5 days a week for 20 minutes  24-Hr Dietary Recall First Meal: oatmeal  Snack: - Second Meal: skipped Snack: - Third Meal: chicken tenderloin + cooked vegetables  Snack: cookies or ice cream or jelly beans  Beverages: water, Coke Zero  Estimated Energy Needs Calories: 1500 Carbohydrate: 170g Protein: 112g Fat: 42g   NUTRITION DIAGNOSIS  Overweight/obesity ([place]-3.3) related to past poor dietary habits and physical inactivity as evidenced by patient w/ planned band to sleeve surgery following dietary guidelines for continued weight loss.   NUTRITION INTERVENTION  Nutrition counseling (C-1) and education (E-2) to facilitate bariatric surgery goals.  Pre-Op Goals Progress & New Goals . Be sure to eat breakfast  . Split dinner in half having some before work and the  rest after work, or have a snack when get off of work then a small dinner later  . Practicing chewing food thoroughly  . NEW: Aim for eating 3 times per day  Handouts Provided Include   Bariatric Snack Ideas   Learning Style & Readiness for Change Teaching method utilized: Visual & Auditory  Demonstrated degree of understanding via: Teach Back  Barriers to learning/adherence to lifestyle change: none stated   MONITORING & EVALUATION Dietary intake, weekly physical activity, body weight, and pre-op goals in 1 month.   Next Steps  Patient is to return to [facility] for 10th SWL visit.

## 2020-03-16 NOTE — Patient Instructions (Signed)
Aim to eat at least 3 times per day. Try having a snack then a small dinner meal, or split your meal in half and have one half for lunch then the other dinner.

## 2020-03-23 ENCOUNTER — Ambulatory Visit (INDEPENDENT_AMBULATORY_CARE_PROVIDER_SITE_OTHER): Payer: 59 | Admitting: Psychology

## 2020-03-23 DIAGNOSIS — F509 Eating disorder, unspecified: Secondary | ICD-10-CM | POA: Diagnosis not present

## 2020-03-23 DIAGNOSIS — R69 Illness, unspecified: Secondary | ICD-10-CM | POA: Diagnosis not present

## 2020-03-25 ENCOUNTER — Encounter: Payer: Self-pay | Admitting: Dietician

## 2020-03-25 ENCOUNTER — Encounter: Payer: 59 | Admitting: Dietician

## 2020-03-25 ENCOUNTER — Other Ambulatory Visit: Payer: Self-pay

## 2020-03-25 DIAGNOSIS — E669 Obesity, unspecified: Secondary | ICD-10-CM

## 2020-03-25 NOTE — Progress Notes (Signed)
Supervised Weight Loss Visit Bariatric Nutrition Education  Planned Surgery: Gastric Band to Sleeve Pt Expectation of Surgery/ Goals: to lose weight  10th out of 12 SWL Appointments   Referral stated Supervised Weight Loss (SWL) visits needed: 12* *only needs 5 more after assessment   Planned surgery: LapBand to Sleeve Pt expectation of surgery: to lose weight   NUTRITION ASSESSMENT  Anthropometrics  Start weight at [facility]: 234.5 lbs (date: 03/01/2020) Today's weight: 229 lbs BMI: 41.9 kg/m2    Lifestyle & Dietary Hx Patient states she has worked on eating at least 3 times per day and that this is going well. States she has been making more meals at home (which was a big goal of hers!) and she feels satisfied after she eats, and is not used to feeling this "fullness" in her stomach. In addition to 24 hour recall below, other foods eaten include yogurt or oatmeal for breakfast, tuna and crackers for lunch, chicken and vegetables for dinner, and sugar-free ice pop or graham crackers for snacks. States she is continually cutting back on [employer] and has tried [employer] waters to replace it with.   Estimated daily fluid intake: 64 oz water  Current average weekly physical activity: attempting to walk 5 days a week for 20 minutes  24-Hr Dietary Recall First Meal: 1 cup Cheerios Snack: - Second Meal: chicken + vegetables  Snack: - Third Meal: salmon + salad  Snack: yogurt  Beverages: water, Coke Zero, [employer]   Estimated Energy Needs Calories: 1500 Carbohydrate: 170g Protein: 112g Fat: 42g   NUTRITION DIAGNOSIS  Overweight/obesity ([place]-3.3) related to past poor dietary habits and physical inactivity as evidenced by patient w/ planned band to sleeve surgery following dietary guidelines for continued weight loss.   NUTRITION INTERVENTION  Nutrition counseling (C-1) and education (E-2) to facilitate bariatric surgery goals.  Pre-Op Goals Progress & New Goals . Eating  breakfast and eating 3+ times per day  . Practicing chewing food thoroughly  . Drinking at least 64 ounces per day  . Getting in physical activity throughout the week  . NEW: Incorporate more fruit throughout the day    Learning Style & Readiness for Change Teaching method utilized: Visual & Auditory  Demonstrated degree of understanding via: Teach Back  Barriers to learning/adherence to lifestyle change: None Identified   MONITORING & EVALUATION Dietary intake, weekly physical activity, body weight, and pre-op goals at next nutrition visit.   Next Steps  Patient is to return to [facility] for 11th SWL visit.

## 2020-03-25 NOTE — Patient Instructions (Addendum)
Continue to:   Eat at least 3 times per day, including breakfast  Chew your food thoroughly   Drink plenty of fluids (at least 64 ounces per day)   Walk at least 20-30 minutes per day on 4 or more days per week   Now, focus on:   Incorporating more fruit throughout the day   [place] job with everything! See you next week :)

## 2020-03-28 ENCOUNTER — Encounter: Payer: 59 | Admitting: Dietician

## 2020-03-28 ENCOUNTER — Other Ambulatory Visit: Payer: Self-pay

## 2020-03-28 ENCOUNTER — Encounter: Payer: Self-pay | Admitting: Dietician

## 2020-03-28 DIAGNOSIS — E669 Obesity, unspecified: Secondary | ICD-10-CM

## 2020-03-28 NOTE — Progress Notes (Signed)
Supervised Weight Loss Visit Bariatric Nutrition Education  Planned Surgery: Gastric Band to Sleeve Pt Expectation of Surgery/ Goals: to lose weight  11th out of 12 SWL Appointments   Referral stated Supervised Weight Loss (SWL) visits needed: 12* *only needs 5 more after assessment   Planned surgery: LapBand to Sleeve Pt expectation of surgery: to lose weight   NUTRITION ASSESSMENT  Anthropometrics  Start weight at [facility]: 234.5 lbs (date: 03/01/2020) Today's weight: 232.2 lbs BMI: 42.5 kg/m2    Lifestyle & Dietary Hx Patient states she feels as though she does not eat many calories per day and wonders if this is slowing her metabolism. Working on incorporating more fruit throughout the day as well as eating smaller frequent meals/snacks. States she had an allergic reaction to fresh blueberries recently. Continuing to cut back on Coke Zero.    Estimated daily fluid intake: 64 oz water  Current average weekly physical activity: attempting to walk 5 days a week for 20 minutes  24-Hr Dietary Recall First Meal: protein shake Snack: yogurt Second Meal: 4 fried chicken tenders  Snack: - Third Meal: yogurt + graham crackers  Snack: yogurt  Beverages: water, Coke Zero, [employer]   Estimated Energy Needs Calories: 1500 Carbohydrate: 170g Protein: 112g Fat: 42g   NUTRITION DIAGNOSIS  Overweight/obesity ([place]-3.3) related to past poor dietary habits and physical inactivity as evidenced by patient w/ planned band to sleeve surgery following dietary guidelines for continued weight loss.   NUTRITION INTERVENTION  Nutrition counseling (C-1) and education (E-2) to facilitate bariatric surgery goals.  Pre-Op Goals Progress & New Goals . Eating breakfast and eating 3+ times per day  . Practicing chewing food thoroughly  . Drinking at least 64 ounces per day  . Getting in physical activity throughout the week  . Incorporating more fruit throughout the day  . NEW: Do not drink with  meals/snacks   Learning Style & Readiness for Change Teaching method utilized: Visual & Auditory  Demonstrated degree of understanding via: Teach Back  Barriers to learning/adherence to lifestyle change: None Identified   MONITORING & EVALUATION Dietary intake, weekly physical activity, body weight, and pre-op goals at next nutrition visit.   Next Steps  Patient is to return to [facility] for 12th SWL visit.

## 2020-03-29 ENCOUNTER — Other Ambulatory Visit (INDEPENDENT_AMBULATORY_CARE_PROVIDER_SITE_OTHER): Payer: 59

## 2020-03-29 DIAGNOSIS — E890 Postprocedural hypothyroidism: Secondary | ICD-10-CM | POA: Diagnosis not present

## 2020-03-30 ENCOUNTER — Encounter: Payer: Self-pay | Admitting: Internal Medicine

## 2020-03-30 LAB — T4, FREE: Free T4: 0.7 ng/dL (ref 0.60–1.60)

## 2020-03-30 LAB — TSH: TSH: 7.7 u[IU]/mL — ABNORMAL HIGH (ref 0.35–4.50)

## 2020-03-31 ENCOUNTER — Encounter: Payer: Self-pay | Admitting: Internal Medicine

## 2020-04-05 ENCOUNTER — Encounter: Payer: 59 | Admitting: Dietician

## 2020-04-05 ENCOUNTER — Encounter: Payer: Self-pay | Admitting: Dietician

## 2020-04-05 ENCOUNTER — Other Ambulatory Visit (HOSPITAL_COMMUNITY): Payer: Self-pay | Admitting: General Surgery

## 2020-04-05 ENCOUNTER — Other Ambulatory Visit: Payer: Self-pay | Admitting: General Surgery

## 2020-04-05 ENCOUNTER — Other Ambulatory Visit: Payer: Self-pay

## 2020-04-05 DIAGNOSIS — E669 Obesity, unspecified: Secondary | ICD-10-CM

## 2020-04-05 NOTE — Progress Notes (Signed)
Supervised Weight Loss Visit Bariatric Nutrition Education  Planned Surgery: Gastric Band to Sleeve Pt Expectation of Surgery/ Goals: to lose weight  12th out of 12 SWL Appointments   Referral stated Supervised Weight Loss (SWL) visits needed: 12* *only needs 5 more after assessment   Planned surgery: LapBand to Sleeve Pt expectation of surgery: to lose weight   NUTRITION ASSESSMENT  Anthropometrics  Start weight at [facility]: 234.5 lbs (date: 03/01/2020) Today's weight: 228.7 lbs BMI: 41.8 kg/m2    Lifestyle & Dietary Hx Patient states she does not drink while she eats, but is still working on chewing food thoroughly. States that routine is hard, such as eating at consistent times and getting in lunch midday and avoiding eating dinner late. Work schedule makes routine difficult. Patient asked good questions about what to expect after surgery, such as how the body will process food and how to maintain hair and skin integrity.   Estimated daily fluid intake: 64 oz water  Current average weekly physical activity: walking 5 days a week for 20 minutes  24-Hr Dietary Recall First Meal: Dannon strawberry yogurt + coffee  Snack: - Second Meal: -  Snack: protein shake + Atkins bar Third Meal: shrimp + egg noodles  Snack: 4 potato chips + Atkins bar  Beverages: water, coffee w/ flavored creamer, Coke Zero  Estimated Energy Needs Calories: 1500 Carbohydrate: 170g Protein: 112g Fat: 42g   NUTRITION DIAGNOSIS  Overweight/obesity ([place]-3.3) related to past poor dietary habits and physical inactivity as evidenced by patient w/ planned band to sleeve surgery following dietary guidelines for continued weight loss.   NUTRITION INTERVENTION  Nutrition counseling (C-1) and education (E-2) to facilitate bariatric surgery goals.  Pre-Op Goals Progress & New Goals . Eating breakfast and eating 3+ times per day  . Practicing chewing food thoroughly  . Drinking at least 64 ounces per day   . Getting in physical activity throughout the week  . Incorporating more fruit throughout the day  . Does not drink with meals/snacks   Learning Style & Readiness for Change Teaching method utilized: Visual & Auditory  Demonstrated degree of understanding via: Teach Back  Barriers to learning/adherence to lifestyle change: None Identified   MONITORING & EVALUATION Dietary intake, weekly physical activity, body weight, and pre-op goals at next nutrition visit.   Next Steps  Patient is to return to [facility] for Pre-Op Class.

## 2020-04-11 ENCOUNTER — Encounter: Payer: 59 | Attending: General Surgery | Admitting: Skilled Nursing Facility1

## 2020-04-11 ENCOUNTER — Other Ambulatory Visit: Payer: Self-pay

## 2020-04-11 DIAGNOSIS — E669 Obesity, unspecified: Secondary | ICD-10-CM | POA: Diagnosis not present

## 2020-04-11 NOTE — Progress Notes (Signed)
Pre-Operative Nutrition Class:  Appt start time: 4753   End time:  1830.  Patient was seen on 04/11/2020 for Pre-Operative Bariatric Surgery Education [facility]    Surgery date:  Surgery type: lap band to sleeve [facility] weight at [facility]: 234.5 Weight today: 227.7   The following the learning objectives were met by the patient during this course:  Identify Pre-Op Dietary Goals and will begin 2 weeks pre-operatively  Identify appropriate sources of fluids and proteins   State protein recommendations and appropriate sources pre and post-operatively  Identify Post-Operative Dietary Goals and will follow for 2 weeks post-operatively  Identify appropriate multivitamin and calcium sources  Describe the need for physical activity post-operatively and will follow MD recommendations  State when to call healthcare provider regarding medication questions or post-operative complications  Handouts given during class include:  Pre-Op Bariatric Surgery Diet Handout  Protein Shake Handout  Post-Op Bariatric Surgery Nutrition Handout  [facility] Program Information Flyer  Support Group Information Flyer  [facility] Bariatric Supplements Price List  Follow-Up Plan: Patient will follow-up at [facility] 2 weeks post operatively for diet advancement per MD.

## 2020-04-12 ENCOUNTER — Ambulatory Visit (HOSPITAL_COMMUNITY)
Admission: RE | Admit: 2020-04-12 | Discharge: 2020-04-12 | Disposition: A | Discharge status: Home / Self Care | Payer: 59 | Source: Ambulatory Visit | Attending: General Surgery | Admitting: General Surgery

## 2020-04-12 ENCOUNTER — Other Ambulatory Visit: Payer: Self-pay

## 2020-04-12 DIAGNOSIS — Z01818 Encounter for other preprocedural examination: Secondary | ICD-10-CM | POA: Diagnosis not present

## 2020-04-14 ENCOUNTER — Ambulatory Visit (HOSPITAL_COMMUNITY): Payer: 59

## 2020-04-14 DIAGNOSIS — R1310 Dysphagia, unspecified: Secondary | ICD-10-CM | POA: Diagnosis not present

## 2020-04-14 DIAGNOSIS — I10 Essential (primary) hypertension: Secondary | ICD-10-CM | POA: Diagnosis not present

## 2020-04-15 ENCOUNTER — Other Ambulatory Visit: Payer: Self-pay | Admitting: Family Medicine

## 2020-04-15 ENCOUNTER — Ambulatory Visit
Admission: RE | Admit: 2020-04-15 | Discharge: 2020-04-15 | Disposition: A | Discharge status: Home / Self Care | Payer: 59 | Source: Ambulatory Visit | Attending: Family Medicine | Admitting: Family Medicine

## 2020-04-15 ENCOUNTER — Other Ambulatory Visit: Payer: Self-pay

## 2020-04-15 DIAGNOSIS — R922 Inconclusive mammogram: Secondary | ICD-10-CM | POA: Diagnosis not present

## 2020-04-15 DIAGNOSIS — N6320 Unspecified lump in the left breast, unspecified quadrant: Secondary | ICD-10-CM

## 2020-04-15 DIAGNOSIS — R928 Other abnormal and inconclusive findings on diagnostic imaging of breast: Secondary | ICD-10-CM

## 2020-04-15 DIAGNOSIS — N6323 Unspecified lump in the left breast, lower outer quadrant: Secondary | ICD-10-CM | POA: Diagnosis not present

## 2020-04-18 ENCOUNTER — Ambulatory Visit: Payer: 59

## 2020-04-21 ENCOUNTER — Ambulatory Visit
Admission: RE | Admit: 2020-04-21 | Discharge: 2020-04-21 | Disposition: A | Discharge status: Home / Self Care | Payer: 59 | Source: Ambulatory Visit | Attending: Family Medicine | Admitting: Family Medicine

## 2020-04-21 ENCOUNTER — Other Ambulatory Visit: Payer: Self-pay

## 2020-04-21 ENCOUNTER — Other Ambulatory Visit: Payer: Self-pay | Admitting: Family Medicine

## 2020-04-21 DIAGNOSIS — N6320 Unspecified lump in the left breast, unspecified quadrant: Secondary | ICD-10-CM

## 2020-04-21 DIAGNOSIS — N6323 Unspecified lump in the left breast, lower outer quadrant: Secondary | ICD-10-CM | POA: Diagnosis not present

## 2020-04-21 DIAGNOSIS — D242 Benign neoplasm of left breast: Secondary | ICD-10-CM | POA: Diagnosis not present

## 2020-04-27 ENCOUNTER — Ambulatory Visit: Payer: Self-pay | Admitting: General Surgery

## 2020-04-27 ENCOUNTER — Other Ambulatory Visit: Payer: Self-pay | Admitting: Family Medicine

## 2020-04-28 NOTE — Patient Instructions (Signed)
DUE TO COVID-19 ONLY ONE VISITOR IS ALLOWED TO COME WITH YOU AND STAY IN THE WAITING ROOM ONLY DURING PRE OP AND PROCEDURE DAY OF SURGERY. THE 1 VISITOR MAY VISIT WITH YOU AFTER SURGERY IN YOUR PRIVATE ROOM DURING VISITING HOURS ONLY!  YOU NEED TO HAVE A COVID 19 TEST ON: 04/29/20, THIS TEST MUST BE DONE BEFORE SURGERY, COME  Alison [name], Alison [name] Alison [name] , [id number].  ([place]) ONCE YOUR COVID TEST IS COMPLETED, PLEASE BEGIN THE QUARANTINE INSTRUCTIONS AS OUTLINED IN YOUR HANDOUT.                Alison [name]    Your procedure is scheduled on: 05/03/20   Report to [facility] Main  Entrance   Report to short stay at: 5:30 AM     Call this number if you have problems the morning of surgery [phone]    Remember:   NO SOLID FOOD AFTER MIDNIGHT THE NIGHT PRIOR TO SURGERY. NOTHING BY MOUTH EXCEPT CLEAR LIQUIDS UNTIL: 4:15 am . PLEASE FINISH GATORADE DRINK PER SURGEON ORDER  WHICH NEEDS TO BE COMPLETED AT: 4:15 am .   CLEAR LIQUID DIET   Foods Allowed                                                                     Foods Excluded  Coffee and tea, regular and decaf                             liquids that you cannot  Plain Jell-O any favor except red or purple                                           see through such as: Fruit ices (not with fruit pulp)                                     milk, soups, orange juice  Iced Popsicles                                    All solid food Carbonated beverages, regular and diet                                    Cranberry, grape and apple juices Sports drinks like Gatorade Lightly seasoned clear broth or consume(fat free) Sugar, honey syrup  Sample Menu Breakfast                                Lunch                                     Supper Cranberry juice  Beef broth                            Chicken broth Jell-O                                     Grape juice                           Apple  juice Coffee or tea                        Jell-O                                      Popsicle                                                Coffee or tea                        Coffee or tea  _____________________________________________________________________  BRUSH YOUR TEETH MORNING OF SURGERY AND RINSE YOUR MOUTH OUT, NO CHEWING GUM CANDY OR MINTS.     Take these medicines the morning of surgery with A SIP OF WATER: amlodipine,levothyroxine,pantoprazole.Use inhalers as usual.                                 You may not have any metal on your body including hair pins and              piercings  Do not wear jewelry, make-up, lotions, powders or perfumes, deodorant             Do not wear nail polish on your fingernails.  Do not shave  48 hours prior to surgery.     Do not bring valuables to the hospital. [name].  Contacts, dentures or bridgework may not be worn into surgery.  Leave suitcase in the car. After surgery it may be brought to your room.     Patients discharged the day of surgery will not be allowed to drive home. IF YOU ARE HAVING SURGERY AND GOING HOME THE SAME DAY, YOU MUST HAVE AN ADULT TO DRIVE YOU HOME AND BE WITH YOU FOR 24 HOURS. YOU MAY GO HOME BY TAXI OR UBER OR ORTHERWISE, BUT AN ADULT MUST ACCOMPANY YOU HOME AND STAY WITH YOU FOR 24 HOURS.  Name and phone number of your driver:  Special Instructions: N/A              Please read over the following fact sheets you were given: _____________________________________________________________________  [facility] - Preparing for Surgery Before surgery, you can play an important role.  Because skin is not sterile, your skin needs to be as free of germs as possible.  You can reduce the number of germs on your skin by washing with CHG (chlorahexidine gluconate) soap before surgery.  CHG is an antiseptic cleaner  which kills germs and bonds with the skin to continue  killing germs even after washing. Please DO NOT use if you have an allergy to CHG or antibacterial soaps.  If your skin becomes reddened/irritated stop using the CHG and inform your nurse when you arrive at Short Stay. Do not shave (including legs and underarms) for at least 48 hours prior to the first CHG shower.  You may shave your face/neck. Please follow these instructions carefully:  1.  Shower with CHG Soap the night before surgery and the  morning of Surgery.  2.  If you choose to wash your hair, wash your hair first as usual with your  normal  shampoo.  3.  After you shampoo, rinse your hair and body thoroughly to remove the  shampoo.                           4.  Use CHG as you would any other liquid soap.  You can apply chg directly  to the skin and wash                       Gently with a scrungie or clean washcloth.  5.  Apply the CHG Soap to your body ONLY FROM THE NECK DOWN.   Do not use on face/ open                           Wound or open sores. Avoid contact with eyes, ears mouth and genitals (private parts).                       Wash face,  Genitals (private parts) with your normal soap.             6.  Wash thoroughly, paying special attention to the area where your surgery  will be performed.  7.  Thoroughly rinse your body with warm water from the neck down.  8.  DO NOT shower/wash with your normal soap after using and rinsing off  the CHG Soap.                9.  Pat yourself dry with a clean towel.            10.  Wear clean pajamas.            11.  Place clean sheets on your bed the night of your first shower and do not  sleep with pets. Day of Surgery : Do not apply any lotions/deodorants the morning of surgery.  Please wear clean clothes to the hospital/surgery center.  FAILURE TO FOLLOW THESE INSTRUCTIONS MAY RESULT IN THE CANCELLATION OF YOUR SURGERY PATIENT SIGNATURE_________________________________  NURSE  SIGNATURE__________________________________  ________________________________________________________________________   Alison [name]  An incentive spirometer is a tool that can help keep your lungs clear and active. This tool measures how well you are filling your lungs with each breath. Taking long deep breaths may help reverse or decrease the chance of developing breathing (pulmonary) problems (especially infection) following:  A long period of time when you are unable to move or be active. BEFORE THE PROCEDURE   If the spirometer includes an indicator to show your best effort, your nurse or respiratory therapist will set it to a desired goal.  If possible, sit up straight or lean slightly forward. Try not to slouch.  Hold the incentive spirometer  in an upright position. INSTRUCTIONS FOR USE  1. Sit on the edge of your bed if possible, or sit up as far as you can in bed or on a chair. 2. Hold the incentive spirometer in an upright position. 3. Breathe out normally. 4. Place the mouthpiece in your mouth and seal your lips tightly around it. 5. Breathe in slowly and as deeply as possible, raising the piston or the ball toward the top of the column. 6. Hold your breath for 3-5 seconds or for as long as possible. Allow the piston or ball to fall to the bottom of the column. 7. Remove the mouthpiece from your mouth and breathe out normally. 8. Rest for a few seconds and repeat Steps 1 through 7 at least 10 times every 1-2 hours when you are awake. Take your time and take a few normal breaths between deep breaths. 9. The spirometer may include an indicator to show your best effort. Use the indicator as a goal to work toward during each repetition. 10. After each set of 10 deep breaths, practice coughing to be sure your lungs are clear. If you have an incision (the cut made at the time of surgery), support your incision when coughing by placing a pillow or rolled up towels firmly  against it. Once you are able to get out of bed, walk around indoors and cough well. You may stop using the incentive spirometer when instructed by your caregiver.  RISKS AND COMPLICATIONS  Take your time so you do not get dizzy or light-headed.  If you are in pain, you may need to take or ask for pain medication before doing incentive spirometry. It is harder to take a deep breath if you are having pain. AFTER USE  Rest and breathe slowly and easily.  It can be helpful to keep track of a log of your progress. Your caregiver can provide you with a simple table to help with this. If you are using the spirometer at home, follow these instructions: [place] IF:   You are having difficultly using the spirometer.  You have trouble using the spirometer as often as instructed.  Your pain medication is not giving enough relief while using the spirometer.  You develop fever of 100.5 F (38.1 C) or higher. SEEK IMMEDIATE MEDICAL CARE IF:   You cough up bloody sputum that had not been present before.  You develop fever of 102 F (38.9 C) or greater.  You develop worsening pain at or near the incision site. MAKE SURE YOU:   Understand these instructions.  Will watch your condition.  Will get help right away if you are not doing well or get worse. Document Released: 04/08/2007 Document Revised: 02/18/2012 Document Reviewed: 06/09/2007 [facility] Patient Information 2014 [place], [place].   ________________________________________________________________________

## 2020-04-29 ENCOUNTER — Other Ambulatory Visit: Payer: Self-pay

## 2020-04-29 ENCOUNTER — Encounter (HOSPITAL_COMMUNITY)
Admission: RE | Admit: 2020-04-29 | Discharge: 2020-04-29 | Disposition: A | Discharge status: Home / Self Care | Payer: 59 | Source: Ambulatory Visit | Attending: General Surgery | Admitting: General Surgery

## 2020-04-29 ENCOUNTER — Other Ambulatory Visit (HOSPITAL_COMMUNITY)
Admission: RE | Admit: 2020-04-29 | Discharge: 2020-04-29 | Disposition: A | Discharge status: Home / Self Care | Payer: 59 | Source: Ambulatory Visit | Attending: General Surgery | Admitting: General Surgery

## 2020-04-29 ENCOUNTER — Encounter (HOSPITAL_COMMUNITY): Payer: Self-pay

## 2020-04-29 DIAGNOSIS — Z01812 Encounter for preprocedural laboratory examination: Secondary | ICD-10-CM | POA: Insufficient documentation

## 2020-04-29 DIAGNOSIS — Z20822 Contact with and (suspected) exposure to covid-19: Secondary | ICD-10-CM | POA: Diagnosis not present

## 2020-04-29 HISTORY — DX: Pneumonia, unspecified organism: J18.9

## 2020-04-29 LAB — CBC WITH DIFFERENTIAL/PLATELET
Abs Immature Granulocytes: 0.09 10*3/uL — ABNORMAL HIGH (ref 0.00–0.07)
Basophils Absolute: 0.1 10*3/uL (ref 0.0–0.1)
Basophils Relative: 1 %
Eosinophils Absolute: 0.3 10*3/uL (ref 0.0–0.5)
Eosinophils Relative: 3 %
HCT: 43.2 % (ref 36.0–46.0)
Hemoglobin: 14.4 g/dL (ref 12.0–15.0)
Immature Granulocytes: 1 %
Lymphocytes Relative: 21 %
Lymphs Abs: 2.5 10*3/uL (ref 0.7–4.0)
MCH: 31.2 pg (ref 26.0–34.0)
MCHC: 33.3 g/dL (ref 30.0–36.0)
MCV: 93.5 fL (ref 80.0–100.0)
Monocytes Absolute: 0.9 10*3/uL (ref 0.1–1.0)
Monocytes Relative: 8 %
Neutro Abs: 7.8 10*3/uL — ABNORMAL HIGH (ref 1.7–7.7)
Neutrophils Relative %: 66 %
Platelets: 221 10*3/uL (ref 150–400)
RBC: 4.62 MIL/uL (ref 3.87–5.11)
RDW: 13.6 % (ref 11.5–15.5)
WBC: 11.7 10*3/uL — ABNORMAL HIGH (ref 4.0–10.5)
nRBC: 0 % (ref 0.0–0.2)

## 2020-04-29 LAB — COMPREHENSIVE METABOLIC PANEL
ALT: 20 U/L (ref 0–44)
AST: 17 U/L (ref 15–41)
Albumin: 3.6 g/dL (ref 3.5–5.0)
Alkaline Phosphatase: 68 U/L (ref 38–126)
Anion gap: 9 (ref 5–15)
BUN: 17 mg/dL (ref 6–20)
CO2: 23 mmol/L (ref 22–32)
Calcium: 9.2 mg/dL (ref 8.9–10.3)
Chloride: 106 mmol/L (ref 98–111)
Creatinine, Ser: 0.7 mg/dL (ref 0.44–1.00)
GFR calc Af Amer: >60 mL/min (ref >60)
GFR calc non Af Amer: >60 mL/min (ref >60)
Glucose, Bld: 75 mg/dL (ref 70–99)
Potassium: 3.7 mmol/L (ref 3.5–5.1)
Sodium: 138 mmol/L (ref 135–145)
Total Bilirubin: 0.5 mg/dL (ref 0.3–1.2)
Total Protein: 6.5 g/dL (ref 6.5–8.1)

## 2020-04-29 LAB — SARS CORONAVIRUS 2 (TAT 6-24 HRS): SARS Coronavirus 2: NEGATIVE

## 2020-04-29 NOTE — Progress Notes (Signed)
PCP - Dr. Billey Chang. LOV: 02/02/20 Cardiologist -   Chest x-ray -  EKG - 04/12/20. EPIC Stress Test -  ECHO -  Cardiac Cath -   Sleep Study -  CPAP -   Fasting Blood Sugar -  Checks Blood Sugar _____ times a day  Blood Thinner Instructions: Aspirin Instructions: Last Dose:  Anesthesia review:  Hx. HTN,TI's,Heart murmur.  Patient denies shortness of breath, fever, cough and chest pain at PAT appointment   Patient verbalized understanding of instructions that were given to them at the PAT appointment. Patient was also instructed that they will need to review over the PAT instructions again at home before surgery.

## 2020-04-30 LAB — ABO/RH: ABO/RH(D): O POS

## 2020-05-02 MED ORDER — GENTAMICIN SULFATE 40 MG/ML IJ SOLN
5.0000 mg/kg | INTRAVENOUS | Status: AC
  Administered 2020-05-03: 360 mg via INTRAVENOUS
  Filled 2020-05-02: qty 9

## 2020-05-02 MED ORDER — BUPIVACAINE LIPOSOME 1.3 % IJ SUSP
20.0000 mL | INTRAMUSCULAR | Status: DC
  Filled 2020-05-02: qty 20

## 2020-05-02 NOTE — Anesthesia Preprocedure Evaluation (Addendum)
Anesthesia Evaluation  Patient identified by MRN, date of birth, ID band Patient awake    Reviewed: Allergy & Precautions, NPO status , Patient's Chart, lab work & pertinent test results  History of Anesthesia Complications Negative for: history of anesthetic complications  Airway Mallampati: III  TM Distance: >3 FB Neck ROM: Full    Dental no notable dental hx. (+) Dental Advisory Given   Pulmonary asthma ,    Pulmonary exam normal        Cardiovascular hypertension, Pt. on medications Normal cardiovascular exam     Neuro/Psych PSYCHIATRIC DISORDERS Anxiety TIA   GI/Hepatic negative GI ROS, Neg liver ROS,   Endo/Other  Hypothyroidism   Renal/GU negative Renal ROS     Musculoskeletal negative musculoskeletal ROS (+)   Abdominal   Peds  Hematology negative hematology ROS (+)   Anesthesia Other Findings   Reproductive/Obstetrics                            Anesthesia Physical Anesthesia Plan  ASA: III  Anesthesia Plan: General   Post-op Pain Management:    Induction: Intravenous  PONV Risk Score and Plan: 4 or greater and Ondansetron, Dexamethasone, Midazolam and Scopolamine patch - Pre-op  Airway Management Planned: Oral ETT  Additional Equipment:   Intra-op Plan:   Post-operative Plan: Extubation in OR  Informed Consent: I have reviewed the patients History and Physical, chart, labs and discussed the procedure including the risks, benefits and alternatives for the proposed anesthesia with the patient or authorized representative who has indicated his/her understanding and acceptance.     Dental advisory given  Plan Discussed with: Anesthesiologist and CRNA  Anesthesia Plan Comments:       Anesthesia Quick Evaluation

## 2020-05-03 ENCOUNTER — Inpatient Hospital Stay (HOSPITAL_COMMUNITY): Payer: No Typology Code available for payment source | Admitting: Anesthesiology

## 2020-05-03 ENCOUNTER — Encounter (HOSPITAL_COMMUNITY)
Admission: RE | Disposition: A | Discharge status: Home / Self Care | Payer: Self-pay | Source: Home / Self Care | Attending: General Surgery

## 2020-05-03 ENCOUNTER — Encounter (HOSPITAL_COMMUNITY): Payer: Self-pay | Admitting: General Surgery

## 2020-05-03 ENCOUNTER — Inpatient Hospital Stay (HOSPITAL_COMMUNITY)
Admission: RE | Admit: 2020-05-03 | Discharge: 2020-05-04 | DRG: 621 | Disposition: A | Discharge status: Home / Self Care | Payer: No Typology Code available for payment source | Attending: General Surgery | Admitting: General Surgery

## 2020-05-03 ENCOUNTER — Other Ambulatory Visit: Payer: Self-pay

## 2020-05-03 DIAGNOSIS — Z6841 Body Mass Index (BMI) 40.0 and over, adult: Secondary | ICD-10-CM

## 2020-05-03 DIAGNOSIS — K317 Polyp of stomach and duodenum: Secondary | ICD-10-CM | POA: Diagnosis not present

## 2020-05-03 DIAGNOSIS — J45909 Unspecified asthma, uncomplicated: Secondary | ICD-10-CM | POA: Diagnosis present

## 2020-05-03 DIAGNOSIS — Z20822 Contact with and (suspected) exposure to covid-19: Secondary | ICD-10-CM | POA: Diagnosis not present

## 2020-05-03 DIAGNOSIS — I10 Essential (primary) hypertension: Secondary | ICD-10-CM | POA: Diagnosis present

## 2020-05-03 DIAGNOSIS — K660 Peritoneal adhesions (postprocedural) (postinfection): Secondary | ICD-10-CM | POA: Diagnosis not present

## 2020-05-03 DIAGNOSIS — F419 Anxiety disorder, unspecified: Secondary | ICD-10-CM | POA: Diagnosis present

## 2020-05-03 DIAGNOSIS — Z91018 Allergy to other foods: Secondary | ICD-10-CM

## 2020-05-03 DIAGNOSIS — Z880 Allergy status to penicillin: Secondary | ICD-10-CM | POA: Diagnosis not present

## 2020-05-03 DIAGNOSIS — E039 Hypothyroidism, unspecified: Secondary | ICD-10-CM | POA: Diagnosis present

## 2020-05-03 DIAGNOSIS — Z823 Family history of stroke: Secondary | ICD-10-CM | POA: Diagnosis not present

## 2020-05-03 DIAGNOSIS — Z803 Family history of malignant neoplasm of breast: Secondary | ICD-10-CM

## 2020-05-03 DIAGNOSIS — Z8249 Family history of ischemic heart disease and other diseases of the circulatory system: Secondary | ICD-10-CM | POA: Diagnosis not present

## 2020-05-03 DIAGNOSIS — R69 Illness, unspecified: Secondary | ICD-10-CM | POA: Diagnosis not present

## 2020-05-03 DIAGNOSIS — E785 Hyperlipidemia, unspecified: Secondary | ICD-10-CM | POA: Diagnosis not present

## 2020-05-03 DIAGNOSIS — Z8041 Family history of malignant neoplasm of ovary: Secondary | ICD-10-CM | POA: Diagnosis not present

## 2020-05-03 DIAGNOSIS — Z79890 Hormone replacement therapy (postmenopausal): Secondary | ICD-10-CM

## 2020-05-03 DIAGNOSIS — Z8673 Personal history of transient ischemic attack (TIA), and cerebral infarction without residual deficits: Secondary | ICD-10-CM | POA: Diagnosis not present

## 2020-05-03 DIAGNOSIS — Z90710 Acquired absence of both cervix and uterus: Secondary | ICD-10-CM | POA: Diagnosis not present

## 2020-05-03 DIAGNOSIS — E282 Polycystic ovarian syndrome: Secondary | ICD-10-CM | POA: Diagnosis present

## 2020-05-03 DIAGNOSIS — K219 Gastro-esophageal reflux disease without esophagitis: Secondary | ICD-10-CM | POA: Diagnosis present

## 2020-05-03 DIAGNOSIS — Z79899 Other long term (current) drug therapy: Secondary | ICD-10-CM | POA: Diagnosis not present

## 2020-05-03 DIAGNOSIS — Z83511 Family history of glaucoma: Secondary | ICD-10-CM

## 2020-05-03 DIAGNOSIS — Z8049 Family history of malignant neoplasm of other genital organs: Secondary | ICD-10-CM | POA: Diagnosis not present

## 2020-05-03 DIAGNOSIS — E782 Mixed hyperlipidemia: Secondary | ICD-10-CM | POA: Diagnosis not present

## 2020-05-03 HISTORY — PX: LAPAROSCOPIC GASTRIC BAND REMOVAL WITH LAPAROSCOPIC GASTRIC SLEEVE RESECTION: SHX6498

## 2020-05-03 LAB — HEMOGLOBIN AND HEMATOCRIT, BLOOD
HCT: 41.8 % (ref 36.0–46.0)
Hemoglobin: 14.6 g/dL (ref 12.0–15.0)

## 2020-05-03 LAB — TYPE AND SCREEN
ABO/RH(D): O POS
Antibody Screen: NEGATIVE

## 2020-05-03 SURGERY — LAPAROSCOPIC GASTRIC BAND REMOVAL WITH LAPAROSCOPIC GASTRIC SLEEVE RESECTION
Anesthesia: General | Site: Abdomen

## 2020-05-03 MED ORDER — PROPOFOL 10 MG/ML IV BOLUS
INTRAVENOUS | Status: DC | PRN
  Administered 2020-05-03: 160 mg via INTRAVENOUS

## 2020-05-03 MED ORDER — PHENYLEPHRINE HCL-NACL 20-0.9 MG/250ML-% IV SOLN
INTRAVENOUS | Status: DC | PRN
  Administered 2020-05-03: 20 ug/min via INTRAVENOUS

## 2020-05-03 MED ORDER — FENTANYL CITRATE (PF) 100 MCG/2ML IJ SOLN
INTRAMUSCULAR | Status: AC
  Filled 2020-05-03: qty 2

## 2020-05-03 MED ORDER — KETAMINE HCL 10 MG/ML IJ SOLN
INTRAMUSCULAR | Status: DC | PRN
  Administered 2020-05-03: 15 mg via INTRAVENOUS
  Administered 2020-05-03 (×2): 10 mg via INTRAVENOUS

## 2020-05-03 MED ORDER — FENTANYL CITRATE (PF) 100 MCG/2ML IJ SOLN
INTRAMUSCULAR | Status: DC | PRN
  Administered 2020-05-03 (×2): 50 ug via INTRAVENOUS

## 2020-05-03 MED ORDER — ROCURONIUM BROMIDE 10 MG/ML (PF) SYRINGE
PREFILLED_SYRINGE | INTRAVENOUS | Status: AC
  Filled 2020-05-03: qty 10

## 2020-05-03 MED ORDER — OXYCODONE HCL 5 MG/5ML PO SOLN
5.0000 mg | Freq: Four times a day (QID) | ORAL | Status: DC | PRN
  Administered 2020-05-03: 5 mg via ORAL
  Filled 2020-05-03: qty 5

## 2020-05-03 MED ORDER — SCOPOLAMINE 1 MG/3DAYS TD PT72
1.0000 | MEDICATED_PATCH | TRANSDERMAL | Status: DC
  Administered 2020-05-03: 1.5 mg via TRANSDERMAL
  Filled 2020-05-03: qty 1

## 2020-05-03 MED ORDER — FENTANYL CITRATE (PF) 100 MCG/2ML IJ SOLN
25.0000 ug | INTRAMUSCULAR | Status: DC | PRN
  Administered 2020-05-03 (×3): 50 ug via INTRAVENOUS

## 2020-05-03 MED ORDER — LORATADINE 10 MG PO TABS
10.0000 mg | ORAL_TABLET | Freq: Every day | ORAL | Status: DC
  Administered 2020-05-04: 10 mg via ORAL
  Filled 2020-05-03: qty 1

## 2020-05-03 MED ORDER — 0.9 % SODIUM CHLORIDE (POUR BTL) OPTIME
TOPICAL | Status: DC | PRN
  Administered 2020-05-03: 1000 mL

## 2020-05-03 MED ORDER — LIDOCAINE 2% (20 MG/ML) 5 ML SYRINGE
INTRAMUSCULAR | Status: DC | PRN
  Administered 2020-05-03: 60 mg via INTRAVENOUS
  Administered 2020-05-03: 1.5 mg/kg/h via INTRAVENOUS

## 2020-05-03 MED ORDER — ACETAMINOPHEN 500 MG PO TABS: 1000.0000 mg | ORAL_TABLET | ORAL | Status: DC

## 2020-05-03 MED ORDER — SUGAMMADEX SODIUM 200 MG/2ML IV SOLN
INTRAVENOUS | Status: DC | PRN
  Administered 2020-05-03: 200 mg via INTRAVENOUS

## 2020-05-03 MED ORDER — ONDANSETRON HCL 4 MG/2ML IJ SOLN
INTRAMUSCULAR | Status: AC
  Filled 2020-05-03: qty 2

## 2020-05-03 MED ORDER — BUPIVACAINE HCL 0.25 % IJ SOLN
INTRAMUSCULAR | Status: DC | PRN
  Administered 2020-05-03: 30 mL

## 2020-05-03 MED ORDER — ONDANSETRON HCL 4 MG/2ML IJ SOLN
INTRAMUSCULAR | Status: DC | PRN
  Administered 2020-05-03: 4 mg via INTRAVENOUS

## 2020-05-03 MED ORDER — MIDAZOLAM HCL 2 MG/2ML IJ SOLN
INTRAMUSCULAR | Status: AC
  Filled 2020-05-03: qty 2

## 2020-05-03 MED ORDER — FAMOTIDINE IN NACL 20-0.9 MG/50ML-% IV SOLN
20.0000 mg | Freq: Two times a day (BID) | INTRAVENOUS | Status: DC
  Administered 2020-05-03 (×2): 20 mg via INTRAVENOUS
  Filled 2020-05-03 (×2): qty 50

## 2020-05-03 MED ORDER — ACETAMINOPHEN 500 MG PO TABS
1000.0000 mg | ORAL_TABLET | Freq: Once | ORAL | Status: AC
  Administered 2020-05-03: 1000 mg via ORAL
  Filled 2020-05-03: qty 2

## 2020-05-03 MED ORDER — ROCURONIUM BROMIDE 10 MG/ML (PF) SYRINGE
PREFILLED_SYRINGE | INTRAVENOUS | Status: DC | PRN
  Administered 2020-05-03: 10 mg via INTRAVENOUS
  Administered 2020-05-03: 50 mg via INTRAVENOUS

## 2020-05-03 MED ORDER — HEPARIN SODIUM (PORCINE) 5000 UNIT/ML IJ SOLN
5000.0000 [IU] | INTRAMUSCULAR | Status: AC
  Administered 2020-05-03: 5000 [IU] via SUBCUTANEOUS
  Filled 2020-05-03: qty 1

## 2020-05-03 MED ORDER — DEXAMETHASONE SODIUM PHOSPHATE 10 MG/ML IJ SOLN
INTRAMUSCULAR | Status: AC
  Filled 2020-05-03: qty 1

## 2020-05-03 MED ORDER — LACTATED RINGERS IV SOLN: INTRAVENOUS | Status: DC

## 2020-05-03 MED ORDER — DEXTROSE-NACL 5-0.45 % IV SOLN: INTRAVENOUS | Status: DC

## 2020-05-03 MED ORDER — ACETAMINOPHEN 160 MG/5ML PO SOLN
1000.0000 mg | Freq: Three times a day (TID) | ORAL | Status: DC
  Administered 2020-05-03 – 2020-05-04 (×3): 1000 mg via ORAL
  Filled 2020-05-03 (×3): qty 40.6

## 2020-05-03 MED ORDER — ENSURE MAX PROTEIN PO LIQD
2.0000 [oz_av] | ORAL | Status: DC
  Administered 2020-05-04 (×3): 2 [oz_av] via ORAL

## 2020-05-03 MED ORDER — STERILE WATER FOR IRRIGATION IR SOLN
Status: DC | PRN
  Administered 2020-05-03: 1000 mL

## 2020-05-03 MED ORDER — ALPRAZOLAM 0.25 MG PO TABS: 0.2500 mg | ORAL_TABLET | Freq: Every evening | ORAL | Status: DC | PRN

## 2020-05-03 MED ORDER — SUCCINYLCHOLINE CHLORIDE 200 MG/10ML IV SOSY
PREFILLED_SYRINGE | INTRAVENOUS | Status: AC
  Filled 2020-05-03: qty 10

## 2020-05-03 MED ORDER — CHLORHEXIDINE GLUCONATE CLOTH 2 % EX PADS: 6.0000 | MEDICATED_PAD | Freq: Once | CUTANEOUS | Status: DC

## 2020-05-03 MED ORDER — MORPHINE SULFATE (PF) 2 MG/ML IV SOLN: 1.0000 mg | INTRAVENOUS | Status: DC | PRN

## 2020-05-03 MED ORDER — LACTATED RINGERS IR SOLN
Status: DC | PRN
  Administered 2020-05-03: 2000 mL

## 2020-05-03 MED ORDER — ALBUTEROL SULFATE (2.5 MG/3ML) 0.083% IN NEBU: 2.5000 mg | INHALATION_SOLUTION | Freq: Four times a day (QID) | RESPIRATORY_TRACT | Status: DC | PRN

## 2020-05-03 MED ORDER — ONDANSETRON HCL 4 MG/2ML IJ SOLN: 4.0000 mg | INTRAMUSCULAR | Status: DC | PRN

## 2020-05-03 MED ORDER — KETAMINE HCL 10 MG/ML IJ SOLN
INTRAMUSCULAR | Status: AC
  Filled 2020-05-03: qty 1

## 2020-05-03 MED ORDER — CHLORHEXIDINE GLUCONATE 0.12 % MT SOLN
15.0000 mL | Freq: Once | OROMUCOSAL | Status: AC
  Administered 2020-05-03: 15 mL via OROMUCOSAL

## 2020-05-03 MED ORDER — LIDOCAINE 2% (20 MG/ML) 5 ML SYRINGE
INTRAMUSCULAR | Status: AC
  Filled 2020-05-03: qty 5

## 2020-05-03 MED ORDER — BUPIVACAINE LIPOSOME 1.3 % IJ SUSP
INTRAMUSCULAR | Status: DC | PRN
  Administered 2020-05-03: 20 mL

## 2020-05-03 MED ORDER — SIMETHICONE 80 MG PO CHEW: 80.0000 mg | CHEWABLE_TABLET | Freq: Four times a day (QID) | ORAL | Status: DC | PRN

## 2020-05-03 MED ORDER — PROMETHAZINE HCL 25 MG/ML IJ SOLN: 6.2500 mg | INTRAMUSCULAR | Status: DC | PRN

## 2020-05-03 MED ORDER — ENOXAPARIN SODIUM 30 MG/0.3ML ~~LOC~~ SOLN
30.0000 mg | Freq: Two times a day (BID) | SUBCUTANEOUS | Status: DC
  Administered 2020-05-03 – 2020-05-04 (×2): 30 mg via SUBCUTANEOUS
  Filled 2020-05-03 (×2): qty 0.3

## 2020-05-03 MED ORDER — SCOPOLAMINE 1 MG/3DAYS TD PT72: 1.0000 | MEDICATED_PATCH | TRANSDERMAL | Status: DC

## 2020-05-03 MED ORDER — ACETAMINOPHEN 500 MG PO TABS: 1000.0000 mg | ORAL_TABLET | Freq: Three times a day (TID) | ORAL | Status: DC

## 2020-05-03 MED ORDER — ORAL CARE MOUTH RINSE: 15.0000 mL | Freq: Once | OROMUCOSAL | Status: AC

## 2020-05-03 MED ORDER — TRAZODONE HCL 50 MG PO TABS: 50.0000 mg | ORAL_TABLET | Freq: Every evening | ORAL | Status: DC | PRN

## 2020-05-03 MED ORDER — SUCCINYLCHOLINE CHLORIDE 200 MG/10ML IV SOSY
PREFILLED_SYRINGE | INTRAVENOUS | Status: DC | PRN
  Administered 2020-05-03: 100 mg via INTRAVENOUS

## 2020-05-03 MED ORDER — LEVOTHYROXINE SODIUM 75 MCG PO TABS
75.0000 ug | ORAL_TABLET | Freq: Every day | ORAL | Status: DC
  Administered 2020-05-04: 75 ug via ORAL
  Filled 2020-05-03: qty 1

## 2020-05-03 MED ORDER — BUPIVACAINE HCL (PF) 0.25 % IJ SOLN
INTRAMUSCULAR | Status: AC
  Filled 2020-05-03: qty 30

## 2020-05-03 MED ORDER — LIDOCAINE HCL 2 % IJ SOLN
INTRAMUSCULAR | Status: AC
  Filled 2020-05-03: qty 20

## 2020-05-03 MED ORDER — APREPITANT 40 MG PO CAPS
40.0000 mg | ORAL_CAPSULE | ORAL | Status: AC
  Administered 2020-05-03: 40 mg via ORAL
  Filled 2020-05-03: qty 1

## 2020-05-03 MED ORDER — GABAPENTIN 300 MG PO CAPS
300.0000 mg | ORAL_CAPSULE | ORAL | Status: AC
  Administered 2020-05-03: 300 mg via ORAL
  Filled 2020-05-03: qty 1

## 2020-05-03 MED ORDER — EPHEDRINE SULFATE-NACL 50-0.9 MG/10ML-% IV SOSY
PREFILLED_SYRINGE | INTRAVENOUS | Status: DC | PRN
  Administered 2020-05-03: 5 mg via INTRAVENOUS
  Administered 2020-05-03: 10 mg via INTRAVENOUS
  Administered 2020-05-03: 5 mg via INTRAVENOUS

## 2020-05-03 MED ORDER — AMLODIPINE BESYLATE 5 MG PO TABS
5.0000 mg | ORAL_TABLET | Freq: Every day | ORAL | Status: DC
  Administered 2020-05-04: 5 mg via ORAL
  Filled 2020-05-03: qty 1

## 2020-05-03 MED ORDER — PROPOFOL 10 MG/ML IV BOLUS
INTRAVENOUS | Status: AC
  Filled 2020-05-03: qty 20

## 2020-05-03 MED ORDER — DEXAMETHASONE SODIUM PHOSPHATE 4 MG/ML IJ SOLN
4.0000 mg | INTRAMUSCULAR | Status: AC
  Administered 2020-05-03: 8 mg via INTRAVENOUS

## 2020-05-03 MED ORDER — PHENYLEPHRINE HCL (PRESSORS) 10 MG/ML IV SOLN
INTRAVENOUS | Status: AC
  Filled 2020-05-03: qty 2

## 2020-05-03 MED ORDER — MIDAZOLAM HCL 5 MG/5ML IJ SOLN
INTRAMUSCULAR | Status: DC | PRN
  Administered 2020-05-03: 2 mg via INTRAVENOUS

## 2020-05-03 MED ORDER — EPHEDRINE 5 MG/ML INJ
INTRAVENOUS | Status: AC
  Filled 2020-05-03: qty 10

## 2020-05-03 SURGICAL SUPPLY — 61 items
APPLIER CLIP ROT 13.4 12 LRG (CLIP) ×3
BAG LAPAROSCOPIC 12 15 PORT 16 (BASKET) ×1 IMPLANT
BAG RETRIEVAL 12/15 (BASKET) ×2
BAG RETRIEVAL 12/15MM (BASKET) ×1
BENZOIN TINCTURE PRP APPL 2/3 (GAUZE/BANDAGES/DRESSINGS) ×3 IMPLANT
BLADE SURG SZ11 CARB STEEL (BLADE) ×3 IMPLANT
BNDG ADH 1X3 SHEER STRL LF (GAUZE/BANDAGES/DRESSINGS) ×18 IMPLANT
CABLE HIGH FREQUENCY MONO STRZ (ELECTRODE) IMPLANT
CHLORAPREP W/TINT 26 (MISCELLANEOUS) ×3 IMPLANT
CLIP APPLIE ROT 13.4 12 LRG (CLIP) IMPLANT
CLOSURE WOUND 1/2 X4 (GAUZE/BANDAGES/DRESSINGS) ×1
COVER SURGICAL LIGHT HANDLE (MISCELLANEOUS) ×3 IMPLANT
COVER WAND RF STERILE (DRAPES) IMPLANT
DECANTER SPIKE VIAL GLASS SM (MISCELLANEOUS) ×1 IMPLANT
DRAPE UTILITY XL STRL (DRAPES) ×6 IMPLANT
ELECT L-HOOK LAP 45CM DISP (ELECTROSURGICAL) ×3
ELECT REM PT RETURN 15FT ADLT (MISCELLANEOUS) ×3 IMPLANT
ELECTRODE L-HOOK LAP 45CM DISP (ELECTROSURGICAL) IMPLANT
GAUZE 4X4 16PLY RFD (DISPOSABLE) ×3 IMPLANT
GLOVE BIOGEL PI IND STRL 7.0 (GLOVE) ×1 IMPLANT
GLOVE BIOGEL PI INDICATOR 7.0 (GLOVE) ×2
GLOVE SURG SS PI 7.0 STRL IVOR (GLOVE) ×3 IMPLANT
GOWN STRL REUS W/TWL LRG LVL3 (GOWN DISPOSABLE) ×3 IMPLANT
GOWN STRL REUS W/TWL XL LVL3 (GOWN DISPOSABLE) ×9 IMPLANT
GRASPER SUT TROCAR 14GX15 (MISCELLANEOUS) ×3 IMPLANT
HOVERMATT SINGLE USE (MISCELLANEOUS) ×2 IMPLANT
KIT BASIN (CUSTOM PROCEDURE TRAY) ×3 IMPLANT
KIT TURNOVER KIT A (KITS) IMPLANT
MARKER SKIN DUAL TIP RULER LAB (MISCELLANEOUS) ×3 IMPLANT
NDL SPNL 22GX3.5 QUINCKE BK (NEEDLE) ×1 IMPLANT
NEEDLE SPNL 22GX3.5 QUINCKE BK (NEEDLE) ×3 IMPLANT
PACK UNIVERSAL I (CUSTOM PROCEDURE TRAY) ×3 IMPLANT
RELOAD STAPLE 60 3.6 BLU REG (STAPLE) IMPLANT
RELOAD STAPLE 60 3.8 GOLD REG (STAPLE) IMPLANT
RELOAD STAPLE 60 4.1 GRN THCK (STAPLE) IMPLANT
RELOAD STAPLER BLUE 60MM (STAPLE) ×3 IMPLANT
RELOAD STAPLER GOLD 60MM (STAPLE) ×1 IMPLANT
RELOAD STAPLER GREEN 60MM (STAPLE) ×1 IMPLANT
SCISSORS LAP 5X45 EPIX DISP (ENDOMECHANICALS) ×2 IMPLANT
SET IRRIG TUBING LAPAROSCOPIC (IRRIGATION / IRRIGATOR) ×3 IMPLANT
SET TUBE SMOKE EVAC HIGH FLOW (TUBING) ×3 IMPLANT
SHEARS HARMONIC ACE PLUS 45CM (MISCELLANEOUS) ×3 IMPLANT
SLEEVE GASTRECTOMY 40FR VISIGI (MISCELLANEOUS) ×3 IMPLANT
SLEEVE XCEL OPT CAN 5 100 (ENDOMECHANICALS) ×6 IMPLANT
SOL ANTI FOG 6CC (MISCELLANEOUS) ×1 IMPLANT
SOLUTION ANTI FOG 6CC (MISCELLANEOUS) ×2
STAPLER ECHELON LONG 60 440 (INSTRUMENTS) ×3 IMPLANT
STAPLER RELOAD BLUE 60MM (STAPLE) ×9
STAPLER RELOAD GOLD 60MM (STAPLE) ×3
STAPLER RELOAD GREEN 60MM (STAPLE) ×3
STRIP CLOSURE SKIN 1/2X4 (GAUZE/BANDAGES/DRESSINGS) ×2 IMPLANT
SUT ETHIBOND 0 36 GRN (SUTURE) IMPLANT
SUT MNCRL AB 4-0 PS2 18 (SUTURE) ×3 IMPLANT
SUT VICRYL 0 TIES 12 18 (SUTURE) ×3 IMPLANT
SYR 20ML LL LF (SYRINGE) ×3 IMPLANT
TOWEL OR 17X26 10 PK STRL BLUE (TOWEL DISPOSABLE) ×3 IMPLANT
TOWEL OR NON WOVEN STRL DISP B (DISPOSABLE) ×3 IMPLANT
TROCAR BLADELESS 15MM (ENDOMECHANICALS) ×3 IMPLANT
TROCAR BLADELESS OPT 5 100 (ENDOMECHANICALS) ×3 IMPLANT
TUBING CONNECTING 10 (TUBING) ×3 IMPLANT
TUBING CONNECTING 10' (TUBING) ×1

## 2020-05-03 NOTE — Transfer of Care (Signed)
Immediate Anesthesia Transfer of Care Note  Patient: Alison [name]  Procedure(s) Performed: LAPAROSCOPIC GASTRIC BAND REMOVAL WITH LAPAROSCOPIC GASTRIC SLEEVE RESECTION; UPPER ENDO AND ERAS PATHWAY (N/A Abdomen)  Patient Location: PACU  Anesthesia Type:General  Level of Consciousness: awake, alert , oriented and patient cooperative  Airway & Oxygen Therapy: Patient Spontanous Breathing and Patient connected to face mask oxygen  Post-op Assessment: Report given to RN, Post -op Vital signs reviewed and stable and Patient moving all extremities  Post vital signs: Reviewed and stable  Last Vitals:  Vitals Value Taken Time  BP    Temp    Pulse    Resp    SpO2      Last Pain:  Vitals:   05/03/20 0558  TempSrc:   PainSc: 0-No pain         Complications: No apparent anesthesia complications

## 2020-05-03 NOTE — Anesthesia Postprocedure Evaluation (Signed)
Anesthesia Post Note  Patient: IRBY DUMAS  Procedure(s) Performed: LAPAROSCOPIC GASTRIC BAND REMOVAL WITH LAPAROSCOPIC GASTRIC SLEEVE RESECTION; UPPER ENDO AND ERAS PATHWAY (N/A Abdomen)     Patient location during evaluation: PACU Anesthesia Type: General Level of consciousness: sedated Pain management: pain level controlled Vital Signs Assessment: post-procedure vital signs reviewed and stable Respiratory status: spontaneous breathing and respiratory function stable Cardiovascular status: stable Postop Assessment: no apparent nausea or vomiting Anesthetic complications: no    Last Vitals:  Vitals:   05/03/20 1145 05/03/20 1215  BP: 128/72 134/74  Pulse: 73 67  Resp: 20 18  Temp:  36.6 C  SpO2: 98% 97%    Last Pain:  Vitals:   05/03/20 1215  TempSrc: Oral  PainSc: 4                  Lainey Nelson DANIEL

## 2020-05-03 NOTE — Anesthesia Procedure Notes (Signed)
Procedure Name: Intubation Date/Time: 05/03/2020 7:25 AM Performed by: Victoriano Lain, CRNA Pre-anesthesia Checklist: Patient identified, Emergency Drugs available, Suction available, Patient being monitored and Timeout performed Patient Re-evaluated:Patient Re-evaluated prior to induction Oxygen Delivery Method: Circle system utilized Preoxygenation: Pre-oxygenation with 100% oxygen Induction Type: IV induction Ventilation: Mask ventilation without difficulty Laryngoscope Size: Glidescope and 3 Grade View: Grade I Tube type: Oral Tube size: 7.5 mm Number of attempts: 1 Airway Equipment and Method: Stylet and Video-laryngoscopy Placement Confirmation: ETT inserted through vocal cords under direct vision,  positive ETCO2 and breath sounds checked- equal and bilateral Secured at: 21 cm Tube secured with: Tape Dental Injury: Teeth and Oropharynx as per pre-operative assessment  Difficulty Due To: Difficult Airway- due to reduced neck mobility Comments: Pt noted to have decreased neck mobility due to prior history of a C4-C7 fusion. Smooth IV induction, easy mask. DL X 1 with MAC 4. Grade 2-3 view. Unable to pass ETT. Masked. DL x 1 with Glidescope #3 blade. Grade 1 view. ATOI. + ETCO2. ETT secured at 21 cm.

## 2020-05-03 NOTE — Progress Notes (Signed)
Pt completed 12 oz of water. Started 1st cups of protein at 2230. No c/o nausea or vomiting.

## 2020-05-03 NOTE — Op Note (Signed)
Alison [name] [id number] Oct 09, 1961 05/03/2020  Preoperative diagnosis: lapband conversion to sleeve gastrectomy   Postoperative diagnosis: Same   Procedure: Upper endoscopy   Surgeon: Catalina Antigua B. Hassell Done  M.D., FACS   Anesthesia: Gen.   Indications for procedure: This patient was undergoing a removal of her lapband and conversion to a sleeve gastrectomy.    Description of procedure: The endoscopy was placed in the mouth and into the oropharynx and under endoscopic vision it was advanced to the esophagogastric junction.  The pouch was insufflated and the pylorus was identified.  The scope was withdrawn to the EG junction and then passed back down to the atrium and deflated.   No bleeding or leaks were detected.  The scope was withdrawn without difficulty.     Matt B. Hassell Done, MD, FACS General, Bariatric, & Minimally Invasive Surgery [facility], [place]

## 2020-05-03 NOTE — Progress Notes (Signed)
PHARMACY CONSULT FOR:  Risk Assessment for Post-Discharge VTE Following Bariatric Surgery  Post-Discharge VTE Risk Assessment: This patient's probability of 30-day post-discharge VTE is increased due to the factors marked:   Female    Age >/=60 years    BMI >/=50 kg/m2    CHF    Dyspnea at Rest    Paraplegia    Non-gastric-band surgery    Operation Time >/=3 hr    Return to OR     Length of Stay >/= 3 d      Hx of VTE   Hypercoagulable condition   Significant venous stasis   Predicted probability of 30-day post-discharge VTE: 0.16%  Other patient-specific factors to consider: Remote hx TIA as a teenager no other hypercoagulable concerns   Recommendation for Discharge: . No pharmacologic prophylaxis post-discharge . Follow daily and recalculate estimated 30d VTE risk if returns to OR or LOS reaches 3 days.  Alison [name] is a 59 y.o. female who underwent laparoscopic conversion of gastric band to sleeve gastrectomy on 5/25   Case start: 0752 Case end: 1013   Allergies  Allergen Reactions  . Aspirin Anaphylaxis  . Cherry Hives and Shortness Of Breath  . Other Hives and Itching    Walnuts and pecans - cause itching and hives in mouth  . Soy Allergy Other (See Comments)    " hives in mouth and wheezing"   . Penicillins Itching    Has patient had a PCN reaction causing immediate rash, facial/tongue/throat swelling, SOB or lightheadedness with hypotension: No Has patient had a PCN reaction causing severe rash involving mucus membranes or skin necrosis: No Has patient had a PCN reaction that required hospitalization: No Has patient had a PCN reaction occurring within the last 10 years: No If all of the above answers are "NO", then may proceed with Cephalosporin use.     Patient Measurements: Height: 5\' 2"  (157.5 cm) Weight: 104.5 kg (230 lb 6.4 oz) IBW/kg (Calculated) : 50.1 Body mass index is 42.14 kg/m.  No results for input(s): WBC, HGB, HCT, PLT, APTT, CREATININE,  LABCREA, CREATININE, CREAT24HRUR, MG, PHOS, ALBUMIN, PROT, ALBUMIN, AST, ALT, ALKPHOS, BILITOT, BILIDIR, IBILI in the last 72 hours. Estimated Creatinine Clearance: 87 mL/min (by C-G formula based on SCr of 0.7 mg/dL).    Past Medical History:  Diagnosis Date  . Anemia    prior to hysterectomy   . Anxiety   . Asthma   . Cervical myelopathy ([place]) 08/28/2018  . Heart murmur   . Hx-TIA (transient ischemic attack) 1992  . Hyperlipidemia   . Hypertension   . Hypothyroidism   . Obesity   . Pneumonia   . Polycystic ovarian syndrome      Medications Prior to Admission  Medication Sig Dispense Refill Last Dose  . albuterol (VENTOLIN HFA) 108 (90 Base) MCG/ACT inhaler Inhale 2 puffs into the lungs every 6 (six) hours as needed. For asthma (Patient taking differently: Inhale 2 puffs into the lungs every 6 (six) hours as needed for shortness of breath. ) 18 g 2 05/01/2020  . amLODipine (NORVASC) 5 MG tablet Take 1 tablet (5 mg total) by mouth daily. 90 tablet 3 05/03/2020 at 0430  . escitalopram (LEXAPRO) 10 MG tablet TAKE 1 TABLET BY MOUTH EVERY DAY 90 tablet 1 05/02/2020 at Unknown time  . hydrochlorothiazide (HYDRODIURIL) 25 MG tablet Take 1 tablet (25 mg total) by mouth daily. (Patient taking differently: Take 25 mg by mouth every other day. ) 90 tablet 3 05/01/2020  .  levothyroxine (SYNTHROID) 75 MCG tablet TAKE 1 TABLET BY MOUTH EVERY DAY 90 tablet 1 05/03/2020 at 0430  . pantoprazole (PROTONIX) 20 MG tablet Take 1 tablet (20 mg total) by mouth 2 (two) times daily. (Patient taking differently: Take 40 mg by mouth daily. ) 60 tablet 2 05/03/2020 at 0430  . ropinirole (REQUIP) 3 MG tablet Take 1 tablet (3 mg total) by mouth at bedtime. 90 tablet 3 05/02/2020 at Unknown time  . rosuvastatin (CRESTOR) 40 MG tablet Take 1 tablet (40 mg total) by mouth at bedtime. 90 tablet 3 05/02/2020 at Unknown time  . ALPRAZolam (XANAX) 0.25 MG tablet Take 1 tablet (0.25 mg total) by mouth at bedtime as needed for  anxiety or sleep. 20 tablet 0 More than a month at Unknown time  . cetirizine (ZYRTEC) 10 MG tablet Take 10 mg by mouth at bedtime as needed (itching).   More than a month at Unknown time  . diphenhydrAMINE (BENADRYL) 2 % cream Apply 1 application topically daily as needed for itching.   More than a month at Unknown time  . traZODone (DESYREL) 50 MG tablet Take 1-2 tablets (50-100 mg total) by mouth at bedtime as needed for sleep. 180 tablet 3 More than a month at Unknown time      Reuel Boom, PharmD, BCPS [phone] 05/03/2020, 12:19 PM

## 2020-05-03 NOTE — H&P (Signed)
Alison [name] is an 59 y.o. female.   Chief Complaint: obesity HPI: 59 yo female s/p lap band insertion with weight regain and reflux. She has completed all preoperative requirements and presents for revision.  Past Medical History:  Diagnosis Date  . Anemia    prior to hysterectomy   . Anxiety   . Asthma   . Cervical myelopathy ([place]) 08/28/2018  . Heart murmur   . Hx-TIA (transient ischemic attack) 1992  . Hyperlipidemia   . Hypertension   . Hypothyroidism   . Obesity   . Pneumonia   . Polycystic ovarian syndrome     Past Surgical History:  Procedure Laterality Date  . ABDOMINAL HYSTERECTOMY  2009   Partial  . ANTERIOR CERVICAL DECOMPRESSION/DISCECTOMY FUSION 4 LEVELS N/A 08/28/2018   Procedure: ANTERIOR CERVICAL DECOMPRESSION FUSION, CERVICAL 4-5, CERVICAL 5-6, CERVICAL 6-7 WITH INSTRUMENTATION AND ALLOGRAFT;  Surgeon: Phylliss Bob, MD;  Location: [facility];  Service: Orthopedics;  Laterality: N/A;  . FEMUR FRACTURE SURGERY  2008   Left  . I & D EXTREMITY Left 06/01/2018   Procedure: IRRIGATION AND DEBRIDEMENT LEFT CALF;  Surgeon: Mcarthur Rossetti, MD;  Location: [facility];  Service: Orthopedics;  Laterality: Left;  . KNEE CARTILAGE SURGERY  2005  . LAPAROSCOPIC GASTRIC BANDING  11/12/2011   Procedure: LAPAROSCOPIC GASTRIC BANDING;  Surgeon: Judieth Keens, DO;  Location: [facility];  Service: General;  Laterality: N/A;  nathanson liver retractor/ endostitch available 2-0 surgidac refills/ layton needle drivers Economist to Pensions consultant  . TONSILLECTOMY  2007    Family History  Problem Relation Age of Onset  . Glaucoma Mother   . Hyperthyroidism Mother   . Heart disease Father   . Stroke Father   . Anesthesia problems Sister   . Migraines Sister   . Cancer Maternal Aunt        Breast Cancer  . Breast cancer Maternal Aunt 65  . Ovarian cancer Maternal Grandmother   . Uterine cancer Maternal Grandmother   . Myasthenia gravis Paternal Grandfather    Social  History:  reports that she has never smoked. She has never used smokeless tobacco. She reports previous alcohol use. She reports that she does not use drugs.  Allergies:  Allergies  Allergen Reactions  . Aspirin Anaphylaxis  . Cherry Hives and Shortness Of Breath  . Other Hives and Itching    Walnuts and pecans - cause itching and hives in mouth  . Soy Allergy Other (See Comments)    " hives in mouth and wheezing"   . Penicillins Itching    Has patient had a PCN reaction causing immediate rash, facial/tongue/throat swelling, SOB or lightheadedness with hypotension: No Has patient had a PCN reaction causing severe rash involving mucus membranes or skin necrosis: No Has patient had a PCN reaction that required hospitalization: No Has patient had a PCN reaction occurring within the last 10 years: No If all of the above answers are "NO", then may proceed with Cephalosporin use.     Medications Prior to Admission  Medication Sig Dispense Refill  . albuterol (VENTOLIN HFA) 108 (90 Base) MCG/ACT inhaler Inhale 2 puffs into the lungs every 6 (six) hours as needed. For asthma (Patient taking differently: Inhale 2 puffs into the lungs every 6 (six) hours as needed for shortness of breath. ) 18 g 2  . amLODipine (NORVASC) 5 MG tablet Take 1 tablet (5 mg total) by mouth daily. 90 tablet 3  . escitalopram (LEXAPRO) 10 MG tablet  TAKE 1 TABLET BY MOUTH EVERY DAY 90 tablet 1  . hydrochlorothiazide (HYDRODIURIL) 25 MG tablet Take 1 tablet (25 mg total) by mouth daily. (Patient taking differently: Take 25 mg by mouth every other day. ) 90 tablet 3  . levothyroxine (SYNTHROID) 75 MCG tablet TAKE 1 TABLET BY MOUTH EVERY DAY 90 tablet 1  . pantoprazole (PROTONIX) 20 MG tablet Take 1 tablet (20 mg total) by mouth 2 (two) times daily. (Patient taking differently: Take 40 mg by mouth daily. ) 60 tablet 2  . ropinirole (REQUIP) 3 MG tablet Take 1 tablet (3 mg total) by mouth at bedtime. 90 tablet 3  .  rosuvastatin (CRESTOR) 40 MG tablet Take 1 tablet (40 mg total) by mouth at bedtime. 90 tablet 3  . ALPRAZolam (XANAX) 0.25 MG tablet Take 1 tablet (0.25 mg total) by mouth at bedtime as needed for anxiety or sleep. 20 tablet 0  . cetirizine (ZYRTEC) 10 MG tablet Take 10 mg by mouth at bedtime as needed (itching).    . diphenhydrAMINE (BENADRYL) 2 % cream Apply 1 application topically daily as needed for itching.    . traZODone (DESYREL) 50 MG tablet Take 1-2 tablets (50-100 mg total) by mouth at bedtime as needed for sleep. 180 tablet 3    No results found for this or any previous visit (from the past 48 hour(s)). No results found.  Review of Systems  Constitutional: Negative for chills and fever.  HENT: Negative for hearing loss.   Respiratory: Negative for cough.   Cardiovascular: Negative for chest pain and palpitations.  Gastrointestinal: Negative for abdominal pain, nausea and vomiting.  Genitourinary: Negative for dysuria and urgency.  Musculoskeletal: Negative for myalgias and neck pain.  Skin: Negative for rash.  Neurological: Negative for dizziness and headaches.  Hematological: Does not bruise/bleed easily.  Psychiatric/Behavioral: Negative for suicidal ideas.    Blood pressure 127/64, pulse 76, temperature 98.6 F (37 C), temperature source Oral, resp. rate 18, height 5\' 2"  (1.575 m), weight 104.5 kg, SpO2 98 %. Physical Exam  Vitals reviewed. Constitutional: She is oriented to person, place, and time. She appears well-developed and well-nourished.  HENT:  Head: Normocephalic and atraumatic.  Eyes: Pupils are equal, round, and reactive to light. Conjunctivae and EOM are normal.  Cardiovascular: Normal rate and regular rhythm.  Respiratory: Effort normal and breath sounds normal.  GI: Soft. Bowel sounds are normal. She exhibits no distension. There is no abdominal tenderness.  Musculoskeletal:        General: Normal range of motion.     Cervical back: Normal range of  motion and neck supple.  Neurological: She is alert and oriented to person, place, and time.  Skin: Skin is warm and dry.  Psychiatric: She has a normal mood and affect. Her behavior is normal.     Assessment/Plan 59 yo female with class III obesity s/p band -lap band removal with conversion to sleeve gastrectomy -ERAS protocol -bariatric protocol  Mickeal Skinner, MD 05/03/2020, 7:10 AM

## 2020-05-03 NOTE — Op Note (Addendum)
Preop Diagnosis: Obesity Class III  Postop Diagnosis: same  Procedure performed: laparoscopic Sleeve Gastrectomy, laparoscopic adjustable gastric band removal  Assitant: Kaylyn Lim  Indications:  The patient is a 59 y.o. year-old morbidly obese female who has been followed in the Bariatric Clinic as an outpatient. This patient was diagnosed with morbid obesity with a BMI of Body mass index is 42.14 kg/m. and significant co-morbidities including hypertension and GERD.  The patient was counseled extensively in the Bariatric Outpatient Clinic and after a thorough explanation of the risks and benefits of surgery (including death from complications, bowel leak, infection such as peritonitis and/or sepsis, internal hernia, bleeding, need for blood transfusion, bowel obstruction, organ failure, pulmonary embolus, deep venous thrombosis, wound infection, incisional hernia, skin breakdown, and others entailed on the consent form) and after a compliant diet and exercise program, the patient was scheduled for an elective laparoscopic sleeve gastrectomy.  Description of Operation:  Following informed consent, the patient was taken to the operating room and placed on the operating table in the supine position.  She had previously received prophylactic antibiotics and subcutaneous heparin for DVT prophylaxis in the pre-op holding area.  After induction of general endotracheal anesthesia by the anesthesiologist, the patient underwent placement of sequential compression devices and an oro-gastric tube.  A timeout was confirmed by the surgery and anesthesia teams.  The patient was adequately padded at all pressure points and placed on a footboard to prevent slippage from the OR table during extremes of position during surgery.  She underwent a routine sterile prep and drape of her entire abdomen.    Next, A transverse incision was made under the left subcostal area and a 34mm optical viewing trocar was introduced into  the peritoneal cavity. Pneumoperitoneum was applied with a high flow and low pressure. A laparoscope was inserted to confirm placement. A extraperitoneal block was then placed at the lateral abdominal wall using exparel diluted with marcaine. 5 additional incisions were placed: 1 2mm trocar to the left of the midline. 1 additional 16mm trocar in the left lateral area, 1 15 mm trocar in the right mid abdomen, 1 82mm trocar in the right subcostal area, and a Nathanson retractor was placed through a subxiphoid incision.  There were some adhesions to the abdominal wall that were taken down with harmonic scalpel. There were some adhesions of the liver to the omentum that were removed with harmonic scalpel. The tubing was identified and the end was severed from the port during the adhesiolysis. The band was identified. The scar over the buckle was lysed with cautery. The band was unbuckled and removed from the abdomen. The fundoplication was divided with scissors to allow the fundus to move laterally.  A visigi dilator was placed to remove gastric distension of the stomach. Next, a hole was created through the lesser omentum along the greater curve of the stomach to enter the lesser sac. The vessels along the greater omentum were  Then ligated and divided using the Harmonic scalpel moving towards the spleen and then short gastric vessels were ligated and divided in the same fashion to fully mobilize the fundus. The left crus was identified to ensure completion of the dissection. Next the antrum was measured and dissection continued inferiorly along the greater curve towards the pylorus and stopped 6cm from the pylorus.   A 40Fr ViSiGi dilator was placed along the lesser curve of the stomach and placed on suction. 1 non-reinforced 48mm Green load echelon stapler(s) followed by 1 32mm Gold  load echelon stapler(s) followed by 3 44mm blue load echelon stapler(s) were used to make the resection along the antrum being sure  to stay well away from the angularis by angling the jaws of the stapler towards the greater curve and later completing the resection staying along the Bureau and ensuring the fundus was not retained by appropriately retracting it lateral. Air was inserted through the Phillips to perform a leak test showing no bubbles and a neutral lie of the stomach.  The assistant then went and performed an upper endoscopy and leak test. No bubbles were seen and the sleeve and antrum distended appropriately. The specimen was then placed in an endocatch bag and removed by the 37mm port. The fascia of the 63mm port was closed with a 0 vicryl by suture passer. Hemostasis was ensured.  An incision was made over the port and cautery used to dissect through the subcutaneous tissue. The port was identiifed and removed. The scar was removed with cautery. A 0 vicryl was placed through the fascial defect by suture passer.   Pneumoperitoneum was evacuated, all ports were removed and all incisions closed with 4-0 monocryl suture in subcuticular fashion. Steristrips and bandaids were put in place for dressing. The patient awoke from anesthesia and was brought to pacu in stable condition. All counts were correct.  Estimated blood loss: 198ml  Specimens:  Sleeve gastrectomy  Local Anesthesia: 50 ml Exparel:0.5% Marcaine mix  Post-Op Plan:       Pain Management: PO, prn      Antibiotics: Prophylactic      Anticoagulation: Prophylactic, Starting now      Post Op Studies/Consults: Not applicable      Intended Discharge: within 48h      Intended Outpatient Follow-Up: Two Week      Intended Outpatient Studies: Not Applicable      Other: Not Applicable  Images:      Arta Bruce Kayliah Tindol

## 2020-05-03 NOTE — Progress Notes (Signed)
Discussed post op day goals with patient including ambulation, IS, diet progression, pain, and nausea control.  BSTOP education provided including BSTOP information guide, "Guide for Pain Management after your Bariatric Procedure".  Questions answered. 

## 2020-05-03 NOTE — Discharge Instructions (Signed)
° ° ° °GASTRIC BYPASS/SLEEVE ° Home Care Instructions ° ° These instructions are to help you care for yourself when you go home. ° °Call: If you have any problems. °• Call [phone] and ask for the surgeon on call °• If you need immediate help, come to the ER at [facility].  °• Tell the ER staff that you are a new post-op gastric bypass or gastric sleeve patient °  °Signs and symptoms to report: • Severe vomiting or nausea °o If you cannot keep down clear liquids for longer than 1 day, call your surgeon  °• Abdominal pain that does not get better after taking your pain medication °• Fever over 100.4° F with chills °• Heart beating over 100 beats a minute °• Shortness of breath at rest °• Chest pain °•  Redness, swelling, drainage, or foul odor at incision (surgical) sites °•  If your incisions open or pull apart °• Swelling or pain in calf (lower leg) °• Diarrhea (Loose bowel movements that happen often), frequent watery, uncontrolled bowel movements °• Constipation, (no bowel movements for 3 days) if this happens: Pick one °o Milk of Magnesia, 2 tablespoons by mouth, 3 times a day for 2 days if needed °o Stop taking Milk of Magnesia once you have a bowel movement °o Call your doctor if constipation continues °Or °o Miralax  (instead of Milk of Magnesia) following the label instructions °o Stop taking Miralax once you have a bowel movement °o Call your doctor if constipation continues °• Anything you think is not normal °  °Normal side effects after surgery: • Unable to sleep at night or unable to focus °• Irritability or moody °• Being tearful (crying) or depressed °These are common complaints, possibly related to your anesthesia medications that put you to sleep, stress of surgery, and change in lifestyle.  This usually goes away a few weeks after surgery.  If these feelings continue, call your primary care doctor. °  °Wound Care: You may have surgical glue, steri-strips, or staples over your incisions after  surgery °• Surgical glue:  Looks like a clear film over your incisions and will wear off a little at a time °• Steri-strips: Strips of tape over your incisions. You may notice a yellowish color on the skin under the steri-strips. This is used to make the   steri-strips stick better. Do not pull the steri-strips off - let them fall off °• Staples: Staples may be removed before you leave the hospital °o If you go home with staples, call [facility], [phone] at for an appointment with your surgeon’s nurse to have staples removed 10 days after surgery. °• Showering: You may shower two (2) days after your surgery unless your surgeon tells you differently °o Wash gently around incisions with warm soapy water, rinse well, and gently pat dry  °o No tub baths until staples are removed, steri-strips fall off or glue is gone.  °  °Medications: • Medications should be liquid or crushed if larger than the size of a dime °• Extended release pills (medication that release a little bit at a time through the day) should NOT be crushed or cut. (examples include XL, ER, DR, SR) °• Depending on the size and number of medications you take, you may need to space (take a few throughout the day)/change the time you take your medications so that you do not over-fill your pouch (smaller stomach) °• Make sure you follow-up with your primary care doctor to   make medication changes needed during rapid weight loss and life-style changes °• If you have diabetes, follow up with the doctor that orders your diabetes medication(s) within one week after surgery and check your blood sugar regularly. °• Do not drive while taking prescription pain medication  °• It is ok to take Tylenol by the bottle instructions with your pain medicine or instead of your pain medicine as needed.  DO NOT TAKE NSAIDS (EXAMPLES OF NSAIDS:  IBUPROFREN/ NAPROXEN)  °Diet:                    First 2 Weeks ° You will see the dietician t about two (2) weeks  after your surgery. The dietician will increase the types of foods you can eat if you are handling liquids well: °• If you have severe vomiting or nausea and cannot keep down clear liquids lasting longer than 1 day, call your surgeon @ ([phone]) °Protein Shake °• Drink at least 2 ounces of shake 5-6 times per day °• Each serving of protein shakes (usually 8 - 12 ounces) should have: °o 15 grams of protein  °o And no more than 5 grams of carbohydrate  °• Goal for protein each day: °o Men = 80 grams per day °o Women = 60 grams per day °• Protein powder may be added to fluids such as non-fat milk or Lactaid milk or unsweetened Soy/Almond milk (limit to 35 grams added protein powder per serving) ° °Hydration °• Slowly increase the amount of water and other clear liquids as tolerated (See Acceptable Fluids) °• Slowly increase the amount of protein shake as tolerated  °•  Sip fluids slowly and throughout the day.  Do not use straws. °• May use sugar substitutes in small amounts (no more than 6 - 8 packets per day; i.e. Splenda) ° °Fluid Goal °• The first goal is to drink at least 8 ounces of protein shake/drink per day (or as directed by the nutritionist); some examples of protein shakes are Syntrax Nectar, Adkins Advantage, EAS Edge HP, and Unjury. See handout from pre-op Bariatric Education Class: °o Slowly increase the amount of protein shake you drink as tolerated °o You may find it easier to slowly sip shakes throughout the day °o It is important to get your proteins in first °• Your fluid goal is to drink 64 - 100 ounces of fluid daily °o It may take a few weeks to build up to this °• 32 oz (or more) should be clear liquids  °And  °• 32 oz (or more) should be full liquids (see below for examples) °• Liquids should not contain sugar, caffeine, or carbonation ° °Clear Liquids: °• Water or Sugar-free flavored water (i.e. Fruit H2O, Propel) °• Decaffeinated coffee or tea (sugar-free) °• Crystal Lite, Wyler’s Lite,  Minute Maid Lite °• Sugar-free Jell-O °• Bouillon or broth °• Sugar-free Popsicle:   *Less than 20 calories each; Limit 1 per day ° °Full Liquids: °Protein Shakes/Drinks + 2 choices per day of other full liquids °• Full liquids must be: °o No More Than 15 grams of Carbs per serving  °o No More Than 3 grams of Fat per serving °• Strained low-fat cream soup (except Cream of Potato or Tomato) °• Non-Fat milk °• Fat-free Lactaid Milk °• Unsweetened Soy Or Unsweetened Almond Milk °• Low Sugar yogurt (Dannon Lite & Fit, Greek yogurt; Oikos Triple Zero; Chobani Simply 100; Yoplait 100 calorie Greek - No Fruit on the Bottom) ° °  °Vitamins   and Minerals • Start 1 day after surgery unless otherwise directed by your surgeon °• 2 Chewable Bariatric Specific Multivitamin / Multimineral Supplement with iron (Example: Bariatric Advantage Multi EA) °• Chewable Calcium with Vitamin D-3 °(Example: 3 Chewable Calcium Plus 600 with Vitamin D-3) °o Take 500 mg three (3) times a day for a total of 1500 mg each day °o Do not take all 3 doses of calcium at one time as it may cause constipation, and you can only absorb 500 mg  at a time  °o Do not mix multivitamins containing iron with calcium supplements; take 2 hours apart °• Menstruating women and those with a history of anemia (a blood disease that causes weakness) may need extra iron °o Talk with your doctor to see if you need more iron °• Do not stop taking or change any vitamins or minerals until you talk to your dietitian or surgeon °• Your Dietitian and/or surgeon must approve all vitamin and mineral supplements °  °Activity and Exercise: Limit your physical activity as instructed by your doctor.  It is important to continue walking at home.  During this time, use these guidelines: °• Do not lift anything greater than ten (10) pounds for at least two (2) weeks °• Do not go back to work or drive until your surgeon says you can °• You may have sex when you feel comfortable  °o It is  VERY important for female patients to use a reliable birth control method; fertility often increases after surgery  °o All hormonal birth control will be ineffective for 30 days after surgery due to medications given during surgery a barrier method must be used. °o Do not get pregnant for at least 18 months °• Start exercising as soon as your doctor tells you that you can °o Make sure your doctor approves any physical activity °• Start with a simple walking program °• Walk 5-15 minutes each day, 7 days per week.  °• Slowly increase until you are walking 30-45 minutes per day °Consider joining our BEL[employer] program. [phone] or email [email or web address] °  °Special Instructions Things to remember: °• Use your CPAP when sleeping if this applies to you ° °• [facility] has two free Bariatric Surgery Support Groups that meet monthly °o The 3rd Thursday of each month, 6 pm, [facility]  °o The 2nd Friday of each month, 11:45 am in the private dining room in the basement of [place] °• It is very important to keep all follow up appointments with your surgeon, dietitian, primary care physician, and behavioral health practitioner °• Routine follow up schedule with your surgeon include appointments at 2-3 weeks, 6-8 weeks, 6 months, and 1 year at a minimum.  Your surgeon may request to see you more often.   °o After the first year, please follow up with your bariatric surgeon and dietitian at least once a year in order to maintain best weight loss results °[facility]: [phone] °[facility]: [phone] °Bariatric Nurse Coordinator: [phone] °  °   Reviewed and Endorsed  °by  Patient Education Committee, June, 2016 °Edits Approved: Aug, 2018 ° ° ° °

## 2020-05-04 ENCOUNTER — Encounter: Payer: Self-pay | Admitting: *Deleted

## 2020-05-04 LAB — CBC WITH DIFFERENTIAL/PLATELET
Abs Immature Granulocytes: 0.06 10*3/uL (ref 0.00–0.07)
Basophils Absolute: 0 10*3/uL (ref 0.0–0.1)
Basophils Relative: 0 %
Eosinophils Absolute: 0 10*3/uL (ref 0.0–0.5)
Eosinophils Relative: 0 %
HCT: 39.2 % (ref 36.0–46.0)
Hemoglobin: 13.2 g/dL (ref 12.0–15.0)
Immature Granulocytes: 0 %
Lymphocytes Relative: 9 %
Lymphs Abs: 1.5 10*3/uL (ref 0.7–4.0)
MCH: 31.1 pg (ref 26.0–34.0)
MCHC: 33.7 g/dL (ref 30.0–36.0)
MCV: 92.2 fL (ref 80.0–100.0)
Monocytes Absolute: 0.8 10*3/uL (ref 0.1–1.0)
Monocytes Relative: 5 %
Neutro Abs: 13.4 10*3/uL — ABNORMAL HIGH (ref 1.7–7.7)
Neutrophils Relative %: 86 %
Platelets: 188 10*3/uL (ref 150–400)
RBC: 4.25 MIL/uL (ref 3.87–5.11)
RDW: 13.4 % (ref 11.5–15.5)
WBC: 15.7 10*3/uL — ABNORMAL HIGH (ref 4.0–10.5)
nRBC: 0 % (ref 0.0–0.2)

## 2020-05-04 LAB — COMPREHENSIVE METABOLIC PANEL
ALT: 36 U/L (ref 0–44)
AST: 48 U/L — ABNORMAL HIGH (ref 15–41)
Albumin: 3.3 g/dL — ABNORMAL LOW (ref 3.5–5.0)
Alkaline Phosphatase: 58 U/L (ref 38–126)
Anion gap: 6 (ref 5–15)
BUN: 8 mg/dL (ref 6–20)
CO2: 23 mmol/L (ref 22–32)
Calcium: 8.8 mg/dL — ABNORMAL LOW (ref 8.9–10.3)
Chloride: 107 mmol/L (ref 98–111)
Creatinine, Ser: 0.6 mg/dL (ref 0.44–1.00)
GFR calc Af Amer: >60 mL/min (ref >60)
GFR calc non Af Amer: >60 mL/min (ref >60)
Glucose, Bld: 140 mg/dL — ABNORMAL HIGH (ref 70–99)
Potassium: 4.2 mmol/L (ref 3.5–5.1)
Sodium: 136 mmol/L (ref 135–145)
Total Bilirubin: 0.7 mg/dL (ref 0.3–1.2)
Total Protein: 5.9 g/dL — ABNORMAL LOW (ref 6.5–8.1)

## 2020-05-04 LAB — SURGICAL PATHOLOGY

## 2020-05-04 MED ORDER — PANTOPRAZOLE SODIUM 40 MG PO TBEC
40.0000 mg | DELAYED_RELEASE_TABLET | Freq: Every day | ORAL | 0 refills | Status: DC
Start: 2020-05-04 — End: 2020-10-11

## 2020-05-04 MED ORDER — ONDANSETRON 4 MG PO TBDP
4.0000 mg | ORAL_TABLET | Freq: Four times a day (QID) | ORAL | 0 refills | Status: DC | PRN
Start: 2020-05-04 — End: 2020-06-27

## 2020-05-04 MED ORDER — GABAPENTIN 100 MG PO CAPS
200.0000 mg | ORAL_CAPSULE | Freq: Two times a day (BID) | ORAL | 0 refills | Status: DC
Start: 2020-05-04 — End: 2020-06-27

## 2020-05-04 MED ORDER — ACETAMINOPHEN 500 MG PO TABS: 1000.0000 mg | ORAL_TABLET | Freq: Three times a day (TID) | ORAL | 0 refills | Status: AC

## 2020-05-04 NOTE — Discharge Summary (Signed)
Physician Discharge Summary  Alison [name] ZOX:[id number] DOB: May 28, 1961 DOA: 05/03/2020  PCP: Leamon Arnt, MD  Admit date: 05/03/2020 Discharge date: 05/04/2020  Recommendations for Outpatient Follow-up:  1.  (include homehealth, outpatient follow-up instructions, specific recommendations for PCP to follow-up on, etc.)  Follow-up Information    Ryota Treece, Arta Bruce, MD. Go on 05/26/2020.   Specialty: General Surgery Why: at 1110 am.  Please arrive 15 minutes prior to your appointment time.  Thank you Contact information: [place] [address] [phone]        Surgery, [place]. Go on 06/29/2020.   Specialty: General Surgery Why: at 9 am.  Please arrive 15 minutes prior to appointment time.  Thank you Contact information: [place] [address] [phone]          Discharge Diagnoses:  Active Problems:   Morbid obesity ([place])   Surgical Procedure: lap band removal and sleeve gastrectomy, upper endoscopy  Discharge Condition: Good Disposition: Home  Diet recommendation: Postoperative sleeve gastrectomy diet (liquids only)  Filed Weights   05/03/20 0556  Weight: 104.5 kg     Hospital Course:  The patient was admitted after undergoing lap band removal and sleeve gastrectomy. POD 0 she ambulated well. POD 1 she was started on the water diet protocol and tolerated 300 ml in the first shift. Once meeting the water amount she was advanced to bariatric protein shakes which they tolerated and were discharged home POD 1.  Treatments: surgery: lap band removal and sleeve gastrectomy  Discharge Instructions  Discharge Instructions    Ambulate hourly while awake   Complete by: As directed    Call MD for:  difficulty breathing, headache or visual disturbances   Complete by: As directed    Call MD for:  persistant dizziness or light-headedness   Complete by: As directed    Call MD for:  persistant nausea  and vomiting   Complete by: As directed    Call MD for:  redness, tenderness, or signs of infection (pain, swelling, redness, odor or green/yellow discharge around incision site)   Complete by: As directed    Call MD for:  severe uncontrolled pain   Complete by: As directed    Call MD for:  temperature >101 F   Complete by: As directed    Diet bariatric full liquid   Complete by: As directed    Discharge wound care:   Complete by: As directed    Remove Bandaids tomorrow, ok to shower tomorrow. Steristrips may fall off in 1-3 weeks.   Incentive spirometry   Complete by: As directed    Perform hourly while awake     Allergies as of 05/04/2020      Reactions   Aspirin Anaphylaxis   Cherry Hives, Shortness Of Breath   Other Hives, Itching   Walnuts and pecans - cause itching and hives in mouth   Soy Allergy Other (See Comments)   " hives in mouth and wheezing"    Penicillins Itching   Has patient had a PCN reaction causing immediate rash, facial/tongue/throat swelling, SOB or lightheadedness with hypotension: No Has patient had a PCN reaction causing severe rash involving mucus membranes or skin necrosis: No Has patient had a PCN reaction that required hospitalization: No Has patient had a PCN reaction occurring within the last 10 years: No If all of the above answers are "NO", then may proceed with Cephalosporin use.  Medication List    STOP taking these medications   hydrochlorothiazide 25 MG tablet Commonly known as: HYDRODIURIL   pantoprazole 20 MG tablet Commonly known as: Protonix Replaced by: pantoprazole 40 MG tablet     TAKE these medications   acetaminophen 500 MG tablet Commonly known as: TYLENOL Take 2 tablets (1,000 mg total) by mouth every 8 (eight) hours for 5 days.   albuterol 108 (90 Base) MCG/ACT inhaler Commonly known as: VENTOLIN HFA Inhale 2 puffs into the lungs every 6 (six) hours as needed. For asthma What changed:   reasons to take  this  additional instructions   ALPRAZolam 0.25 MG tablet Commonly known as: XANAX Take 1 tablet (0.25 mg total) by mouth at bedtime as needed for anxiety or sleep.   amLODipine 5 MG tablet Commonly known as: NORVASC Take 1 tablet (5 mg total) by mouth daily. Notes to patient: Monitor Blood Pressure Daily and keep a log for primary care physician.  You may need to make changes to your medications with rapid weight loss.     cetirizine 10 MG tablet Commonly known as: ZYRTEC Take 10 mg by mouth at bedtime as needed (itching).   diphenhydrAMINE 2 % cream Commonly known as: BENADRYL Apply 1 application topically daily as needed for itching.   escitalopram 10 MG tablet Commonly known as: LEXAPRO TAKE 1 TABLET BY MOUTH EVERY DAY   gabapentin 100 MG capsule Commonly known as: NEURONTIN Take 2 capsules (200 mg total) by mouth every 12 (twelve) hours.   levothyroxine 75 MCG tablet Commonly known as: SYNTHROID TAKE 1 TABLET BY MOUTH EVERY DAY   ondansetron 4 MG disintegrating tablet Commonly known as: ZOFRAN-ODT Take 1 tablet (4 mg total) by mouth every 6 (six) hours as needed for nausea or vomiting.   pantoprazole 40 MG tablet Commonly known as: PROTONIX Take 1 tablet (40 mg total) by mouth daily for 60 doses. Replaces: pantoprazole 20 MG tablet   rOPINIRole 3 MG tablet Commonly known as: REQUIP Take 1 tablet (3 mg total) by mouth at bedtime.   rosuvastatin 40 MG tablet Commonly known as: CRESTOR Take 1 tablet (40 mg total) by mouth at bedtime.   traZODone 50 MG tablet Commonly known as: DESYREL Take 1-2 tablets (50-100 mg total) by mouth at bedtime as needed for sleep.            Discharge Care Instructions  (From admission, onward)         Start     Ordered   05/04/20 0000  Discharge wound care:    Comments: Remove Bandaids tomorrow, ok to shower tomorrow. Steristrips may fall off in 1-3 weeks.   05/04/20 1856         Follow-up Information     Darnell Stimson, Arta Bruce, MD. Go on 05/26/2020.   Specialty: General Surgery Why: at 1110 am.  Please arrive 15 minutes prior to your appointment time.  Thank you Contact information: [place] [address] [phone]        Surgery, [place]. Go on 06/29/2020.   Specialty: General Surgery Why: at 9 am.  Please arrive 15 minutes prior to appointment time.  Thank you Contact information: [place] [address] [phone]            The results of significant diagnostics from this hospitalization (including imaging, microbiology, ancillary and laboratory) are listed below for reference.    Significant Diagnostic Studies: DG Chest 2 View  Result Date: 04/13/2020 CLINICAL DATA:  Preop for bariatric surgery. EXAM: CHEST - 2 VIEW COMPARISON:  08/06/2017 FINDINGS: Cardiac silhouette is normal in size. No mediastinal or hilar masses. No evidence of adenopathy. Clear lungs.  No pleural effusion or pneumothorax. Skeletal structures are intact. IMPRESSION: No active cardiopulmonary disease. Electronically Signed   By: Lajean Manes M.D.   On: 04/13/2020 08:39   US BREAST LTD UNI LEFT INC AXILLA  Result Date: 04/15/2020 CLINICAL DATA:  Delayed short interval follow-up for LEFT breast. Ultrasound-guided core biopsy of superficial hypoechoic palpable mass in the 3 o'clock location of the LEFT breast was performed on 06/29/2019, showing fibrocystic changes and focal prominent stromal cells. There is no atypia or malignancy. Follow-up LEFT breast was recommended in 6 months. Patient reports palpable mass in the LEFT breast has gotten smaller. EXAM: DIGITAL DIAGNOSTIC BILATERAL MAMMOGRAM WITH CAD AND TOMO ULTRASOUND LEFT BREAST COMPARISON:  06/29/2019 and earlier ACR Breast Density Category c: The breast tissue is heterogeneously dense, which may obscure small masses. FINDINGS: Right breast: Mammogram: Spot compression views confirm presence of a  low-attenuation circumscribed mass in the UPPER-OUTER QUADRANT of the RIGHT breast. Otherwise RIGHT breast is negative. Mammographic images were processed with CAD. Physical Exam: I palpate no abnormality in the UPPER-OUTER QUADRANT of the RIGHT breast. Ultrasound: Targeted ultrasound is performed, showing a group of cysts in the 10 o'clock location of the RIGHT breast 8 centimeters from the nipple measuring 2.1 x 0.6 x 1.3 centimeters. No solid component or areas of acoustic shadowing. Left breast: Mammogram: Ribbon shaped clip is identified in the LATERAL anterior aspect of the LEFT breast from previous biopsy. No mammographic abnormality in this region. No new findings in the LEFT breast. Mammographic images were processed with CAD. Physical Exam: I palpate soft superficial nodule in the 3:30 o'clock location of the LEFT breast near the nipple. Ultrasound: Targeted ultrasound is performed, showing a superficial hypoechoic mass in the 3:30 o'clock location of the LEFT breast 2 centimeters from the nipple measuring 1.3 x 0.3 x 1.5 centimeters. Internal blood flow is confirmed on Doppler evaluation. Previously mass measured 1.3 x 0.3 x 1.2 centimeters. Evaluation of the LEFT axilla is negative for adenopathy. IMPRESSION: Slightly larger superficial mass in the 3 o'clock location of the LEFT breast, previously showing fibrocystic changes and prominent stromal cells. A second ultrasound-guided core biopsy is recommended. No LEFT axillary adenopathy. RECOMMENDATION: Ultrasound-guided core biopsy of LEFT breast 3 o'clock location. I have discussed the findings and recommendations with the patient. If applicable, a reminder letter will be sent to the patient regarding the next appointment. BI-RADS CATEGORY  4: Suspicious. Electronically Signed   By: Nolon Nations M.D.   On: 04/15/2020 12:52   US BREAST LTD UNI RIGHT INC AXILLA  Result Date: 04/15/2020 CLINICAL DATA:  Delayed short interval follow-up for LEFT  breast. Ultrasound-guided core biopsy of superficial hypoechoic palpable mass in the 3 o'clock location of the LEFT breast was performed on 06/29/2019, showing fibrocystic changes and focal prominent stromal cells. There is no atypia or malignancy. Follow-up LEFT breast was recommended in 6 months. Patient reports palpable mass in the LEFT breast has gotten smaller. EXAM: DIGITAL DIAGNOSTIC BILATERAL MAMMOGRAM WITH CAD AND TOMO ULTRASOUND LEFT BREAST COMPARISON:  06/29/2019 and earlier ACR Breast Density Category c: The breast tissue is heterogeneously dense, which may obscure small masses. FINDINGS: Right breast: Mammogram: Spot compression views confirm presence of a low-attenuation circumscribed mass in the UPPER-OUTER QUADRANT of the RIGHT breast. Otherwise RIGHT breast  is negative. Mammographic images were processed with CAD. Physical Exam: I palpate no abnormality in the UPPER-OUTER QUADRANT of the RIGHT breast. Ultrasound: Targeted ultrasound is performed, showing a group of cysts in the 10 o'clock location of the RIGHT breast 8 centimeters from the nipple measuring 2.1 x 0.6 x 1.3 centimeters. No solid component or areas of acoustic shadowing. Left breast: Mammogram: Ribbon shaped clip is identified in the LATERAL anterior aspect of the LEFT breast from previous biopsy. No mammographic abnormality in this region. No new findings in the LEFT breast. Mammographic images were processed with CAD. Physical Exam: I palpate soft superficial nodule in the 3:30 o'clock location of the LEFT breast near the nipple. Ultrasound: Targeted ultrasound is performed, showing a superficial hypoechoic mass in the 3:30 o'clock location of the LEFT breast 2 centimeters from the nipple measuring 1.3 x 0.3 x 1.5 centimeters. Internal blood flow is confirmed on Doppler evaluation. Previously mass measured 1.3 x 0.3 x 1.2 centimeters. Evaluation of the LEFT axilla is negative for adenopathy. IMPRESSION: Slightly larger superficial  mass in the 3 o'clock location of the LEFT breast, previously showing fibrocystic changes and prominent stromal cells. A second ultrasound-guided core biopsy is recommended. No LEFT axillary adenopathy. RECOMMENDATION: Ultrasound-guided core biopsy of LEFT breast 3 o'clock location. I have discussed the findings and recommendations with the patient. If applicable, a reminder letter will be sent to the patient regarding the next appointment. BI-RADS CATEGORY  4: Suspicious. Electronically Signed   By: Nolon Nations M.D.   On: 04/15/2020 12:52   MM DIAG BREAST TOMO BILATERAL  Result Date: 04/15/2020 CLINICAL DATA:  Delayed short interval follow-up for LEFT breast. Ultrasound-guided core biopsy of superficial hypoechoic palpable mass in the 3 o'clock location of the LEFT breast was performed on 06/29/2019, showing fibrocystic changes and focal prominent stromal cells. There is no atypia or malignancy. Follow-up LEFT breast was recommended in 6 months. Patient reports palpable mass in the LEFT breast has gotten smaller. EXAM: DIGITAL DIAGNOSTIC BILATERAL MAMMOGRAM WITH CAD AND TOMO ULTRASOUND LEFT BREAST COMPARISON:  06/29/2019 and earlier ACR Breast Density Category c: The breast tissue is heterogeneously dense, which may obscure small masses. FINDINGS: Right breast: Mammogram: Spot compression views confirm presence of a low-attenuation circumscribed mass in the UPPER-OUTER QUADRANT of the RIGHT breast. Otherwise RIGHT breast is negative. Mammographic images were processed with CAD. Physical Exam: I palpate no abnormality in the UPPER-OUTER QUADRANT of the RIGHT breast. Ultrasound: Targeted ultrasound is performed, showing a group of cysts in the 10 o'clock location of the RIGHT breast 8 centimeters from the nipple measuring 2.1 x 0.6 x 1.3 centimeters. No solid component or areas of acoustic shadowing. Left breast: Mammogram: Ribbon shaped clip is identified in the LATERAL anterior aspect of the LEFT breast  from previous biopsy. No mammographic abnormality in this region. No new findings in the LEFT breast. Mammographic images were processed with CAD. Physical Exam: I palpate soft superficial nodule in the 3:30 o'clock location of the LEFT breast near the nipple. Ultrasound: Targeted ultrasound is performed, showing a superficial hypoechoic mass in the 3:30 o'clock location of the LEFT breast 2 centimeters from the nipple measuring 1.3 x 0.3 x 1.5 centimeters. Internal blood flow is confirmed on Doppler evaluation. Previously mass measured 1.3 x 0.3 x 1.2 centimeters. Evaluation of the LEFT axilla is negative for adenopathy. IMPRESSION: Slightly larger superficial mass in the 3 o'clock location of the LEFT breast, previously showing fibrocystic changes and prominent stromal cells. A second ultrasound-guided core  biopsy is recommended. No LEFT axillary adenopathy. RECOMMENDATION: Ultrasound-guided core biopsy of LEFT breast 3 o'clock location. I have discussed the findings and recommendations with the patient. If applicable, a reminder letter will be sent to the patient regarding the next appointment. BI-RADS CATEGORY  4: Suspicious. Electronically Signed   By: Nolon Nations M.D.   On: 04/15/2020 12:52   MM CLIP PLACEMENT LEFT  Result Date: 04/21/2020 CLINICAL DATA:  Ultrasound-guided core needle biopsy was performed of a mass in the 3 o'clock to 3:30 position of the left breast 2 cm from the nipple. This mass was previously biopsied in July of 2020, and contains a ribbon shaped biopsy clip. A coil shaped biopsy clip was placed today. EXAM: DIAGNOSTIC LEFT MAMMOGRAM POST ULTRASOUND BIOPSY COMPARISON:  Two 2 FINDINGS: Mammographic images were obtained following ultrasound guided biopsy of a superficial hypoechoic oval mass with internal vascularity at 3 o'clock to 3:30 position of the left breast. The biopsy marking clip is in expected position at the site of biopsy. Please note that the ribbon shaped biopsy clip  placed in July 2020 is no longer present, indicating it was removed at the time of biopsy. IMPRESSION: Appropriate positioning of the coil shaped biopsy marking clip at the site of biopsy in the 3 o'clock to 3:30 position. Final Assessment: Post Procedure Mammograms for Marker Placement Electronically Signed   By: Curlene Dolphin M.D.   On: 04/21/2020 15:01   [place] LT BREAST BX W LOC DEV 1ST LESION IMG BX SPEC US GUIDE  Addendum Date: 04/26/2020   ADDENDUM REPORT: 04/26/2020 08:05 ADDENDUM: Pathology revealed BIOPSY SITE. 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 This was found to be concordant by Dr. Curlene Dolphin, with excision recommended. Pathology results were discussed with the patient by telephone. The patient reported doing well after the biopsy with tenderness at the site. Post biopsy instructions and care were reviewed and questions were answered. The patient was encouraged to call [facility] for any additional concerns. Surgical consultation has been arranged with Dr. Coralie Keens at [facility] on May 12, 2020. Pathology results reported by Stacie Acres RN on 04/26/2020. Electronically Signed   By: Curlene Dolphin M.D.   On: 04/26/2020 08:05   Result Date: 04/26/2020 CLINICAL DATA:  Ultrasound-guided core needle biopsy was recommended of a mass in the 3 o'clock to 3:30 left breast to re-evaluate a superficial hypoechoic palpable mass. On recent mammogram and ultrasound follow-up the mass measured slightly larger and biopsy of the mass will be performed today. EXAM: ULTRASOUND GUIDED LEFT BREAST CORE NEEDLE BIOPSY COMPARISON:  Previous exam(s). PROCEDURE: I met with the patient and we discussed the procedure of ultrasound-guided biopsy, including benefits and alternatives. We discussed the high likelihood of a successful procedure. We discussed the risks of the procedure, including infection, bleeding,  tissue injury, clip migration, and inadequate sampling. Informed written consent was given. The usual time-out protocol was performed immediately prior to the procedure. Lesion quadrant: Lower outer quadrant Using sterile technique and 1% Lidocaine as local anesthetic, under direct ultrasound visualization, a 14 gauge spring-loaded device was used to perform biopsy of a hypoechoic superficial mass with internal vascularity at 3 o'clock to 3:30 position 2 cm from nipple using a lateral approach. At the conclusion of the procedure coil tissue marker clip was deployed into the biopsy cavity. Follow up 2 view mammogram was performed and dictated separately. IMPRESSION: Ultrasound guided biopsy  of the left breast. No apparent complications. Electronically Signed: By: Curlene Dolphin M.D. On: 04/21/2020 15:04    Labs: Basic Metabolic Panel: Recent Labs  Lab 04/29/20 1449 05/04/20 0442  NA 138 136  K 3.7 4.2  CL 106 107  CO2 23 23  GLUCOSE 75 140*  BUN 17 8  CREATININE 0.70 0.60  CALCIUM 9.2 8.8*   Liver Function Tests: Recent Labs  Lab 04/29/20 1449 05/04/20 0442  AST 17 48*  ALT 20 36  ALKPHOS 68 58  BILITOT 0.5 0.7  PROT 6.5 5.9*  ALBUMIN 3.6 3.3*    CBC: Recent Labs  Lab 04/29/20 1449 05/03/20 1228 05/04/20 0442  WBC 11.7*  --  15.7*  NEUTROABS 7.8*  --  13.4*  HGB 14.4 14.6 13.2  HCT 43.2 41.8 39.2  MCV 93.5  --  92.2  PLT 221  --  188    CBG: No results for input(s): GLUCAP in the last 168 hours.  Active Problems:   Morbid obesity ([place])   VTE plan: no chemical prophylaxis recommended ([email or web address])  Time coordinating discharge: 15 min

## 2020-05-04 NOTE — Progress Notes (Signed)
Patient alert and oriented, pain is controlled. Patient is tolerating fluids, advanced to protein shake today, patient is tolerating well.  Reviewed Gastric sleeve discharge instructions with patient and patient is able to articulate understanding.  Provided information on BELT program, Support Group and [facility] outpatient pharmacy. All questions answered, will continue to monitor.  Total fluid intake 840 Per dehydration protocol call back one week post op 

## 2020-05-04 NOTE — Plan of Care (Signed)
Patient was given DC instructions, all questions were answered and patient walked to main entrance with NT.

## 2020-05-06 ENCOUNTER — Telehealth: Payer: Self-pay

## 2020-05-06 NOTE — Telephone Encounter (Signed)
Patient was in [facility] for surgery. Discharged on 05/04/2020. Do you want to see for follow up? Not sure if I needed to do TCM Call for her. She does have follow up with surgery soon.

## 2020-05-06 NOTE — Telephone Encounter (Signed)
No need to see me for f/u. Thanks. She should f/u with surgery.

## 2020-05-10 ENCOUNTER — Telehealth (HOSPITAL_COMMUNITY): Payer: Self-pay

## 2020-05-10 NOTE — Telephone Encounter (Signed)
Patient called to discuss post bariatric surgery follow up questions.  See below:   1.  Tell me about your pain and pain management? Soreness is better moving a lot more  2.  Let's talk about fluid intake.  How much total fluid are you taking in? 74 ounces of fluid  3.  How much protein have you taken in the last 2 days? 60 grams of protein  4.  Have you had nausea?  Tell me about when have experienced nausea and what you did to help? Denies   5.  Has the frequency or color changed with your urine? Light in color   6.  Tell me what your incisions look like?            no problems  7.  Have you been passing gas? BM? Had BM  8.  If a problem or question were to arise who would you call?  Do you know contact numbers for [facility], [facility], and [facility]? Aware of all services   9.  How has the walking going? Walking at home up and about  10.  How are your vitamins and calcium going?  How are you taking them? mvi started needs to get calcium

## 2020-05-12 ENCOUNTER — Other Ambulatory Visit: Payer: Self-pay | Admitting: Surgery

## 2020-05-12 DIAGNOSIS — D242 Benign neoplasm of left breast: Secondary | ICD-10-CM | POA: Diagnosis not present

## 2020-05-17 ENCOUNTER — Other Ambulatory Visit: Payer: Self-pay

## 2020-05-17 ENCOUNTER — Encounter: Payer: No Typology Code available for payment source | Attending: General Surgery | Admitting: Skilled Nursing Facility1

## 2020-05-17 DIAGNOSIS — E669 Obesity, unspecified: Secondary | ICD-10-CM

## 2020-05-18 NOTE — Progress Notes (Signed)
2 Week Post-Operative Nutrition Class   Patient was seen on 02/03/19 for Post-Operative Nutrition education at [facility].    Surgery date: 05/03/2020 Surgery type: lap band to sleeve Start weight at [facility]: 234.5 Weight today: 218.8   Body Composition Scale 05/17/2020  Total Body Fat % 44.9  Visceral Fat 17  Fat-Free Mass % 55   Total Body Water % 42   Muscle-Mass lbs 28.1  Body Fat Displacement          Torso  lbs 60.9         Left Leg  lbs 12.1         Right Leg  lbs 12.1         Left Arm  lbs 6         Right Arm   lbs 6     The following the learning objectives were met by the patient during this course:  Identifies Phase 3 (Soft, High Proteins) Dietary Goals and will begin from 2 weeks post-operatively to 2 months post-operatively  Identifies appropriate sources of fluids and proteins   States protein recommendations and appropriate sources post-operatively  Identifies the need for appropriate texture modifications, mastication, and bite sizes when consuming solids  Identifies appropriate multivitamin and calcium sources post-operatively  Describes the need for physical activity post-operatively and will follow MD recommendations  States when to call healthcare provider regarding medication questions or post-operative complications   Handouts given during class include:  Phase 3A: Soft, High Protein Diet Handout   Follow-Up Plan: Patient will follow-up at [facility] in 6 weeks for 2 month post-op nutrition visit for diet advancement per MD.

## 2020-05-23 ENCOUNTER — Telehealth: Payer: Self-pay | Admitting: Skilled Nursing Facility1

## 2020-05-23 NOTE — Telephone Encounter (Signed)
RD called pt to verify fluid intake once starting soft, solid proteins 2 week post-bariatric surgery.   Daily Fluid intake: Daily Protein intake:  Concerns/issues:   LVM 

## 2020-05-30 ENCOUNTER — Telehealth: Payer: Self-pay

## 2020-05-30 NOTE — Telephone Encounter (Signed)
Pt called office and left a VM asking [place] to call her. It does not appear that she is an established pt here.   I called pt, no answer, left a message asking her to call us back. If pt calls back please assist her.

## 2020-06-16 DIAGNOSIS — M25562 Pain in left knee: Secondary | ICD-10-CM | POA: Diagnosis not present

## 2020-06-20 NOTE — Progress Notes (Signed)
Your procedure is scheduled on Monday. July 19th.  Report to [facility] Main Entrance "A" at 9:35 A.M., and check in at the Admitting office.  Call this number if you have problems the morning of surgery:  [phone]  Call [phone] if you have any questions prior to your surgery date Monday-Friday 8am-4pm    Remember:  Do not eat after midnight the night before your surgery  You may drink clear liquids until 8:35 A.M. the morning of your surgery.   Clear liquids allowed are: Water, Non-Citrus Juices (without pulp), Carbonated Beverages, Clear Tea, Black Coffee Only, and Gatorade  Please complete your PRE-SURGERY ENSURE that was provided to you by 8:35 A.M. the morning of surgery  Please, if able, drink it in one setting. DO NOT SIP.    Take these medicines the morning of surgery with A SIP OF WATER amLODipine (NORVASC)  escitalopram (LEXAPRO)  levothyroxine (SYNTHROID) pantoprazole (PROTONIX)   If needed - albuterol (VENTOLIN HFA)/inhaler - bring with you the morning of surgery   As of today, STOP taking any Aspirin (unless otherwise instructed by your surgeon) Aleve, Naproxen, Ibuprofen, Motrin, Advil, Goody's, BC's, all herbal medications, fish oil, and all vitamins.                     Do not wear jewelry, make up, or nail polish            Do not wear lotions, powders, perfumes, or deodorant.            Do not shave 48 hours prior to surgery.             Do not bring valuables to the hospital.            [facility] is not responsible for any belongings or valuables.  Do NOT Smoke (Tobacco/Vaping) or drink Alcohol 24 hours prior to your procedure If you use a CPAP at night, you may bring all equipment for your overnight stay.   Contacts, glasses, dentures or bridgework may not be worn into surgery.      For patients admitted to the hospital, discharge time will be determined by your treatment team.   Patients discharged the day of surgery will not be allowed to drive  home, and someone needs to stay with them for 24 hours.  Special instructions:   [place]- Preparing For Surgery  Before surgery, you can play an important role. Because skin is not sterile, your skin needs to be as free of germs as possible. You can reduce the number of germs on your skin by washing with CHG (chlorahexidine gluconate) Soap before surgery.  CHG is an antiseptic cleaner which kills germs and bonds with the skin to continue killing germs even after washing.    Oral Hygiene is also important to reduce your risk of infection.  Remember - BRUSH YOUR TEETH THE MORNING OF SURGERY WITH YOUR REGULAR TOOTHPASTE  Please do not use if you have an allergy to CHG or antibacterial soaps. If your skin becomes reddened/irritated stop using the CHG.  Do not shave (including legs and underarms) for at least 48 hours prior to first CHG shower. It is OK to shave your face.  Please follow these instructions carefully.   1. Shower the NIGHT BEFORE SURGERY and the MORNING OF SURGERY with CHG Soap.   2. If you chose to wash your hair, wash your hair first as usual with your normal shampoo.  3. After you shampoo,  rinse your hair and body thoroughly to remove the shampoo.  4. Use CHG as you would any other liquid soap. You can apply CHG directly to the skin and wash gently with a scrungie or a clean washcloth.   5. Apply the CHG Soap to your body ONLY FROM THE NECK DOWN.  Do not use on open wounds or open sores. Avoid contact with your eyes, ears, mouth and genitals (private parts). Wash Face and genitals (private parts)  with your normal soap.   6. Wash thoroughly, paying special attention to the area where your surgery will be performed.  7. Thoroughly rinse your body with warm water from the neck down.  8. DO NOT shower/wash with your normal soap after using and rinsing off the CHG Soap.  9. Pat yourself dry with a CLEAN TOWEL.  10. Wear CLEAN PAJAMAS to bed the night before  surgery  11. Place CLEAN SHEETS on your bed the night of your first shower and DO NOT SLEEP WITH PETS.  Day of Surgery: Wear Clean/Comfortable clothing the morning of surgery Do not apply any deodorants/lotions.   Remember to brush your teeth WITH YOUR REGULAR TOOTHPASTE.   Please read over the following fact sheets that you were given.

## 2020-06-21 ENCOUNTER — Other Ambulatory Visit: Payer: Self-pay

## 2020-06-21 ENCOUNTER — Encounter (HOSPITAL_COMMUNITY): Payer: Self-pay

## 2020-06-21 ENCOUNTER — Encounter (HOSPITAL_COMMUNITY)
Admission: RE | Admit: 2020-06-21 | Discharge: 2020-06-21 | Disposition: A | Discharge status: Home / Self Care | Payer: No Typology Code available for payment source | Source: Ambulatory Visit | Attending: Surgery | Admitting: Surgery

## 2020-06-21 DIAGNOSIS — Z01812 Encounter for preprocedural laboratory examination: Secondary | ICD-10-CM | POA: Insufficient documentation

## 2020-06-21 HISTORY — DX: Family history of other specified conditions: Z84.89

## 2020-06-21 HISTORY — DX: Gastro-esophageal reflux disease without esophagitis: K21.9

## 2020-06-21 HISTORY — DX: Unspecified osteoarthritis, unspecified site: M19.90

## 2020-06-21 LAB — CBC
HCT: 44.8 % (ref 36.0–46.0)
Hemoglobin: 15.1 g/dL — ABNORMAL HIGH (ref 12.0–15.0)
MCH: 31.1 pg (ref 26.0–34.0)
MCHC: 33.7 g/dL (ref 30.0–36.0)
MCV: 92.2 fL (ref 80.0–100.0)
Platelets: 187 10*3/uL (ref 150–400)
RBC: 4.86 MIL/uL (ref 3.87–5.11)
RDW: 14 % (ref 11.5–15.5)
WBC: 7.9 10*3/uL (ref 4.0–10.5)
nRBC: 0 % (ref 0.0–0.2)

## 2020-06-21 LAB — BASIC METABOLIC PANEL
Anion gap: 10 (ref 5–15)
BUN: 8 mg/dL (ref 6–20)
CO2: 25 mmol/L (ref 22–32)
Calcium: 9.6 mg/dL (ref 8.9–10.3)
Chloride: 102 mmol/L (ref 98–111)
Creatinine, Ser: 0.89 mg/dL (ref 0.44–1.00)
GFR calc Af Amer: >60 mL/min (ref >60)
GFR calc non Af Amer: >60 mL/min (ref >60)
Glucose, Bld: 79 mg/dL (ref 70–99)
Potassium: 3.5 mmol/L (ref 3.5–5.1)
Sodium: 137 mmol/L (ref 135–145)

## 2020-06-21 NOTE — Progress Notes (Signed)
PCP - Billey Chang, MD Cardiologist - Denies  PPM/ICD - Denies  Chest x-ray - 04/13/20 EKG - 04/12/20 Stress Test - 02/21/11 ECHO - 02/20/11 Cardiac Cath - Denies  Sleep Study - Yes, negative for OSA CPAP - No  Patient denies being diabetic.  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 Patient was worried about number of calories and grams of sugar they contained, therefore she opted to drink other clears with lower calorie and sugar options  COVID TEST- 06/23/20   Anesthesia review: Yes, hx. Murmur, review ECHO and stress test.  Patient denies shortness of breath, fever, cough and chest pain at PAT appointment   All instructions explained to the patient, with a verbal understanding of the material. Patient agrees to go over the instructions while at home for a better understanding. Patient also instructed to self quarantine after being tested for COVID-19. The opportunity to ask questions was provided.

## 2020-06-22 NOTE — Progress Notes (Signed)
Anesthesia Chart Review:  Patient history lists heart murmur.  She had echo in 2012 that was benign, normal LVEF, only AV sclerosis without stenosis.  History of TIA at 59 years old.  In 2019 she had carotid duplex prior to having ACDF that showed bilateral 1 to 39% stenosis.  Preop labs reviewed, unremarkable.  EKG 04/12/2020: Normal sinus rhythm.  Rate 71. Low voltage QRS. Cannot rule out Anterior infarct , age undetermined  TTE 02/20/2011: Study conclusions: Left ventricle: The cavity size was normal.  Wall thickness was increased in a pattern of mild LVH.  The estimated ejection fraction was 60%.  Wall motion was normal; there were no regional wall motion normalities. Aortic valve: Sclerosis without stenosis.  Carotid US 08/18/18: Final Interpretation:  Right Carotid: Velocities in the right ICA are consistent with a 1-39%  stenosis.   Left Carotid: Velocities in the left ICA are consistent with a 1-39%  stenosis.   Vertebrals: Bilateral vertebral arteries demonstrate antegrade flow.  Subclavians: Normal flow hemodynamics were seen in bilateral subclavian        arteries.    Wynonia Musty [facility] Short Stay Center/Anesthesiology Phone [phone] 06/22/2020 11:39 AM

## 2020-06-22 NOTE — Anesthesia Preprocedure Evaluation (Deleted)
Anesthesia Evaluation    Airway        Dental   Pulmonary           Cardiovascular hypertension,      Neuro/Psych    GI/Hepatic   Endo/Other    Renal/GU      Musculoskeletal   Abdominal   Peds  Hematology   Anesthesia Other Findings   Reproductive/Obstetrics                             Anesthesia Physical Anesthesia Plan  ASA:   Anesthesia Plan:    Post-op Pain Management:    Induction:   PONV Risk Score and Plan:   Airway Management Planned:   Additional Equipment:   Intra-op Plan:   Post-operative Plan:   Informed Consent:   Plan Discussed with:   Anesthesia Plan Comments: (PAT note by Karoline Caldwell, PA-C: Patient history lists heart murmur.  She had echo in 2012 that was benign, normal LVEF, only AV sclerosis without stenosis.  History of TIA at 59 years old.  In 2019 she had carotid duplex prior to having ACDF that showed bilateral 1 to 39% stenosis.  Preop labs reviewed, unremarkable.  EKG 04/12/2020: Normal sinus rhythm.  Rate 71. Low voltage QRS. Cannot rule out Anterior infarct , age undetermined  TTE 02/20/2011: Study conclusions: Left ventricle: The cavity size was normal.  Wall thickness was increased in a pattern of mild LVH.  The estimated ejection fraction was 60%.  Wall motion was normal; there were no regional wall motion normalities. Aortic valve: Sclerosis without stenosis.  Carotid US 08/18/18: Final Interpretation:  Right Carotid: Velocities in the right ICA are consistent with a 1-39%  stenosis.   Left Carotid: Velocities in the left ICA are consistent with a 1-39%  stenosis.   Vertebrals: Bilateral vertebral arteries demonstrate antegrade flow.  Subclavians: Normal flow hemodynamics were seen in bilateral subclavian        arteries.   )        Anesthesia Quick Evaluation

## 2020-06-23 ENCOUNTER — Other Ambulatory Visit (HOSPITAL_COMMUNITY)
Admission: RE | Admit: 2020-06-23 | Discharge: 2020-06-23 | Disposition: A | Discharge status: Home / Self Care | Payer: No Typology Code available for payment source | Source: Ambulatory Visit | Attending: Surgery | Admitting: Surgery

## 2020-06-23 DIAGNOSIS — Z20822 Contact with and (suspected) exposure to covid-19: Secondary | ICD-10-CM | POA: Diagnosis not present

## 2020-06-23 DIAGNOSIS — Z01812 Encounter for preprocedural laboratory examination: Secondary | ICD-10-CM | POA: Insufficient documentation

## 2020-06-23 LAB — SARS CORONAVIRUS 2 (TAT 6-24 HRS): SARS Coronavirus 2: NEGATIVE

## 2020-06-26 NOTE — H&P (Signed)
   Alison [name]  Location: [facility] Patient #: [id number] DOB: 1961-01-27 Single / Language: Cleophus Molt / Race: Black or African American Female   History of Present Illness The patient is a 59 year old female who presents with a breast mass.  Chief complaint: Left breast mass  This is a 59 year old female who presents with a left breast mass. This was found last year. In July of last year she underwent mammograms and ultrasound showing the 1.3 x 1.5 cm mass at the 3:30 position adjacent to the nipple areolar complex. She underwent a biopsy of this showing fibrocystic changes and stromal cells. A 6 month follow-up was recommended. Repeat mammogram and ultrasound shows that the left breast mass was slightly larger. She underwent a repeat biopsy showing intraductal papilloma. The patient denies nipple discharge. She has had no previous problems regarding her breast. There is a family history of breast cancer however. She is otherwise without complaints. 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 She is recovering from that well.   Medication History (Andreas Blower, [place]; Levothyroxine Sodium (75MCG Tablet, Oral) Active. Albuterol Sulfate HFA (108 (90 Base)MCG/ACT Aerosol Soln, Inhalation) Active. rOPINIRole HCl (3MG  Tablet, Oral) Active. Rosuvastatin Calcium (40MG  Tablet, Oral) Active. Pantoprazole Sodium (40MG  Packet, Oral) Active. AmLODIPine Besylate (5MG  Tablet, Oral) Active. Escitalopram Oxalate (10MG  Tablet, Oral) Active. Medications Reconciled  Vitals (Armen Glo Herring CMA;  Weight: 221.5 lb Height: 62in Body Surface Area: 2 m Body Mass Index: 40.51 kg/m  Temp.: 39F  Pulse: 94 (Regular)  P.OX: 94% (Room air) BP: 126/82(Sitting, Left Arm, Standard)     Physical Exam ( The physical exam findings are as follows: Note: Generally she is well appearance  Physical examination, I can palpate the mass at the 3  to 3:30 position of the left breast in the superficial tissue at the edge of the nipple areolar complex. Just lateral to this there is a small hematoma from her most recent biopsy. There is no axillary adenopathy.    Assessment & Plan  INTRADUCTAL PAPILLOMA OF BREAST, LEFT (D24.2)  Impression: I reviewed her mammograms, ultrasound, and pathology results from both the biopsies. I have discussed the diagnosis of a left breast intraductal papilloma with her. Given the increase in the size of the mass and her family history of breast cancer, a left breast lumpectomy is recommended to completely remove this area for histologic evaluation. I gave her a copy of the pathology results. I discussed the surgical procedure in detail. I discussed the risks which includes but is not limited to bleeding, infection, injury to surrounding structures, the need for further procedures of malignancy is found, cardiopulmonary issues, postoperative recovery, etc. After discussion, she wishes to proceed with surgery which will be scheduled.

## 2020-06-27 ENCOUNTER — Encounter (HOSPITAL_COMMUNITY)
Admission: RE | Disposition: A | Discharge status: Home / Self Care | Payer: Self-pay | Source: Home / Self Care | Attending: Surgery

## 2020-06-27 ENCOUNTER — Encounter (HOSPITAL_COMMUNITY): Payer: Self-pay | Admitting: Surgery

## 2020-06-27 ENCOUNTER — Ambulatory Visit (HOSPITAL_COMMUNITY): Payer: No Typology Code available for payment source | Admitting: Certified Registered"

## 2020-06-27 ENCOUNTER — Ambulatory Visit (HOSPITAL_COMMUNITY)
Admission: RE | Admit: 2020-06-27 | Discharge: 2020-06-27 | Disposition: A | Discharge status: Home / Self Care | Payer: No Typology Code available for payment source | Attending: Surgery | Admitting: Surgery

## 2020-06-27 ENCOUNTER — Other Ambulatory Visit: Payer: Self-pay

## 2020-06-27 ENCOUNTER — Ambulatory Visit (HOSPITAL_COMMUNITY): Payer: No Typology Code available for payment source | Admitting: Physician Assistant

## 2020-06-27 DIAGNOSIS — Z8673 Personal history of transient ischemic attack (TIA), and cerebral infarction without residual deficits: Secondary | ICD-10-CM | POA: Insufficient documentation

## 2020-06-27 DIAGNOSIS — K219 Gastro-esophageal reflux disease without esophagitis: Secondary | ICD-10-CM | POA: Diagnosis not present

## 2020-06-27 DIAGNOSIS — D242 Benign neoplasm of left breast: Secondary | ICD-10-CM | POA: Diagnosis not present

## 2020-06-27 DIAGNOSIS — E039 Hypothyroidism, unspecified: Secondary | ICD-10-CM | POA: Diagnosis not present

## 2020-06-27 DIAGNOSIS — Z903 Acquired absence of stomach [part of]: Secondary | ICD-10-CM | POA: Diagnosis not present

## 2020-06-27 DIAGNOSIS — Z79899 Other long term (current) drug therapy: Secondary | ICD-10-CM | POA: Diagnosis not present

## 2020-06-27 DIAGNOSIS — F418 Other specified anxiety disorders: Secondary | ICD-10-CM | POA: Diagnosis not present

## 2020-06-27 DIAGNOSIS — Z803 Family history of malignant neoplasm of breast: Secondary | ICD-10-CM | POA: Insufficient documentation

## 2020-06-27 DIAGNOSIS — I10 Essential (primary) hypertension: Secondary | ICD-10-CM | POA: Diagnosis not present

## 2020-06-27 DIAGNOSIS — Z79890 Hormone replacement therapy (postmenopausal): Secondary | ICD-10-CM | POA: Insufficient documentation

## 2020-06-27 DIAGNOSIS — J4520 Mild intermittent asthma, uncomplicated: Secondary | ICD-10-CM | POA: Diagnosis not present

## 2020-06-27 DIAGNOSIS — E782 Mixed hyperlipidemia: Secondary | ICD-10-CM | POA: Diagnosis not present

## 2020-06-27 DIAGNOSIS — J45909 Unspecified asthma, uncomplicated: Secondary | ICD-10-CM | POA: Diagnosis not present

## 2020-06-27 DIAGNOSIS — R69 Illness, unspecified: Secondary | ICD-10-CM | POA: Diagnosis not present

## 2020-06-27 HISTORY — PX: BREAST LUMPECTOMY: SHX2

## 2020-06-27 SURGERY — BREAST LUMPECTOMY
Anesthesia: General | Site: Breast | Laterality: Left

## 2020-06-27 MED ORDER — LACTATED RINGERS IV SOLN: INTRAVENOUS | Status: DC

## 2020-06-27 MED ORDER — LACTATED RINGERS IV SOLN: INTRAVENOUS | Status: DC | PRN

## 2020-06-27 MED ORDER — GLYCOPYRROLATE PF 0.2 MG/ML IJ SOSY
PREFILLED_SYRINGE | INTRAMUSCULAR | Status: AC
  Filled 2020-06-27: qty 1

## 2020-06-27 MED ORDER — FENTANYL CITRATE (PF) 250 MCG/5ML IJ SOLN
INTRAMUSCULAR | Status: AC
  Filled 2020-06-27: qty 5

## 2020-06-27 MED ORDER — CHLORHEXIDINE GLUCONATE CLOTH 2 % EX PADS: 6.0000 | MEDICATED_PAD | Freq: Once | CUTANEOUS | Status: DC

## 2020-06-27 MED ORDER — ONDANSETRON HCL 4 MG/2ML IJ SOLN
INTRAMUSCULAR | Status: DC | PRN
  Administered 2020-06-27: 4 mg via INTRAVENOUS

## 2020-06-27 MED ORDER — CIPROFLOXACIN IN D5W 400 MG/200ML IV SOLN
400.0000 mg | INTRAVENOUS | Status: AC
  Administered 2020-06-27: 400 mg via INTRAVENOUS

## 2020-06-27 MED ORDER — MIDAZOLAM HCL 2 MG/2ML IJ SOLN
INTRAMUSCULAR | Status: AC
  Filled 2020-06-27: qty 2

## 2020-06-27 MED ORDER — PROPOFOL 10 MG/ML IV BOLUS
INTRAVENOUS | Status: AC
  Filled 2020-06-27: qty 20

## 2020-06-27 MED ORDER — BUPIVACAINE HCL (PF) 0.25 % IJ SOLN
INTRAMUSCULAR | Status: AC
  Filled 2020-06-27: qty 30

## 2020-06-27 MED ORDER — FENTANYL CITRATE (PF) 100 MCG/2ML IJ SOLN: 25.0000 ug | INTRAMUSCULAR | Status: DC | PRN

## 2020-06-27 MED ORDER — GABAPENTIN 300 MG PO CAPS
ORAL_CAPSULE | ORAL | Status: AC
  Administered 2020-06-27: 300 mg via ORAL
  Filled 2020-06-27: qty 1

## 2020-06-27 MED ORDER — SCOPOLAMINE 1 MG/3DAYS TD PT72
MEDICATED_PATCH | TRANSDERMAL | Status: AC
  Administered 2020-06-27: 1.5 mg via TRANSDERMAL
  Filled 2020-06-27: qty 1

## 2020-06-27 MED ORDER — ACETAMINOPHEN 500 MG PO TABS: 1000.0000 mg | ORAL_TABLET | ORAL | Status: AC

## 2020-06-27 MED ORDER — ONDANSETRON HCL 4 MG/2ML IJ SOLN
INTRAMUSCULAR | Status: AC
  Filled 2020-06-27: qty 2

## 2020-06-27 MED ORDER — DEXAMETHASONE SODIUM PHOSPHATE 4 MG/ML IJ SOLN
INTRAMUSCULAR | Status: DC | PRN
  Administered 2020-06-27: 6 mg via INTRAVENOUS

## 2020-06-27 MED ORDER — 0.9 % SODIUM CHLORIDE (POUR BTL) OPTIME
TOPICAL | Status: DC | PRN
  Administered 2020-06-27: 1000 mL

## 2020-06-27 MED ORDER — CIPROFLOXACIN IN D5W 400 MG/200ML IV SOLN
INTRAVENOUS | Status: AC
  Filled 2020-06-27: qty 200

## 2020-06-27 MED ORDER — CHLORHEXIDINE GLUCONATE 0.12 % MT SOLN: 15.0000 mL | Freq: Once | OROMUCOSAL | Status: AC

## 2020-06-27 MED ORDER — BUPIVACAINE HCL (PF) 0.25 % IJ SOLN
INTRAMUSCULAR | Status: DC | PRN
  Administered 2020-06-27: 10 mL

## 2020-06-27 MED ORDER — GLYCOPYRROLATE PF 0.2 MG/ML IJ SOSY
PREFILLED_SYRINGE | INTRAMUSCULAR | Status: DC | PRN
  Administered 2020-06-27: .1 mg via INTRAVENOUS

## 2020-06-27 MED ORDER — GABAPENTIN 300 MG PO CAPS: 300.0000 mg | ORAL_CAPSULE | ORAL | Status: AC

## 2020-06-27 MED ORDER — PROMETHAZINE HCL 25 MG/ML IJ SOLN: 6.2500 mg | INTRAMUSCULAR | Status: DC | PRN

## 2020-06-27 MED ORDER — STERILE WATER FOR IRRIGATION IR SOLN
Status: DC | PRN
Start: 2020-06-27 — End: 2020-06-27
  Administered 2020-06-27: 1000 mL

## 2020-06-27 MED ORDER — TRAMADOL HCL 50 MG PO TABS: 50.0000 mg | ORAL_TABLET | Freq: Four times a day (QID) | ORAL | 0 refills | Status: DC | PRN

## 2020-06-27 MED ORDER — FENTANYL CITRATE (PF) 100 MCG/2ML IJ SOLN
INTRAMUSCULAR | Status: DC | PRN
  Administered 2020-06-27 (×2): 50 ug via INTRAVENOUS

## 2020-06-27 MED ORDER — DEXAMETHASONE SODIUM PHOSPHATE 10 MG/ML IJ SOLN
INTRAMUSCULAR | Status: AC
  Filled 2020-06-27: qty 1

## 2020-06-27 MED ORDER — MIDAZOLAM HCL 5 MG/5ML IJ SOLN
INTRAMUSCULAR | Status: DC | PRN
  Administered 2020-06-27: 2 mg via INTRAVENOUS

## 2020-06-27 MED ORDER — EPHEDRINE 5 MG/ML INJ
INTRAVENOUS | Status: AC
  Filled 2020-06-27: qty 10

## 2020-06-27 MED ORDER — SCOPOLAMINE 1 MG/3DAYS TD PT72: 1.0000 | MEDICATED_PATCH | TRANSDERMAL | Status: DC

## 2020-06-27 MED ORDER — LIDOCAINE 2% (20 MG/ML) 5 ML SYRINGE
INTRAMUSCULAR | Status: DC | PRN
  Administered 2020-06-27: 100 mg via INTRAVENOUS

## 2020-06-27 MED ORDER — CHLORHEXIDINE GLUCONATE 0.12 % MT SOLN
OROMUCOSAL | Status: AC
  Administered 2020-06-27: 15 mL via OROMUCOSAL
  Filled 2020-06-27: qty 15

## 2020-06-27 MED ORDER — PROPOFOL 10 MG/ML IV BOLUS
INTRAVENOUS | Status: DC | PRN
  Administered 2020-06-27: 200 mg via INTRAVENOUS

## 2020-06-27 MED ORDER — ORAL CARE MOUTH RINSE: 15.0000 mL | Freq: Once | OROMUCOSAL | Status: AC

## 2020-06-27 MED ORDER — ENSURE PRE-SURGERY PO LIQD: 296.0000 mL | Freq: Once | ORAL | Status: DC

## 2020-06-27 MED ORDER — ACETAMINOPHEN 500 MG PO TABS
ORAL_TABLET | ORAL | Status: AC
  Administered 2020-06-27: 1000 mg via ORAL
  Filled 2020-06-27: qty 2

## 2020-06-27 MED ORDER — LIDOCAINE 2% (20 MG/ML) 5 ML SYRINGE
INTRAMUSCULAR | Status: AC
  Filled 2020-06-27: qty 5

## 2020-06-27 SURGICAL SUPPLY — 34 items
BINDER BREAST LRG (GAUZE/BANDAGES/DRESSINGS) IMPLANT
BINDER BREAST XLRG (GAUZE/BANDAGES/DRESSINGS) IMPLANT
CANISTER SUCT 3000ML PPV (MISCELLANEOUS) IMPLANT
CHLORAPREP W/TINT 26 (MISCELLANEOUS) ×3 IMPLANT
CNTNR URN SCR LID CUP LEK RST (MISCELLANEOUS) ×1 IMPLANT
CONT SPEC 4OZ STRL OR WHT (MISCELLANEOUS) ×3
COVER SURGICAL LIGHT HANDLE (MISCELLANEOUS) ×3 IMPLANT
COVER WAND RF STERILE (DRAPES) IMPLANT
DECANTER SPIKE VIAL GLASS SM (MISCELLANEOUS) ×3 IMPLANT
DERMABOND ADVANCED (GAUZE/BANDAGES/DRESSINGS) ×2
DERMABOND ADVANCED .7 DNX12 (GAUZE/BANDAGES/DRESSINGS) ×1 IMPLANT
DEVICE DUBIN SPECIMEN MAMMOGRA (MISCELLANEOUS) IMPLANT
DRAPE LAPAROTOMY 100X72 PEDS (DRAPES) ×3 IMPLANT
ELECT REM PT RETURN 9FT ADLT (ELECTROSURGICAL) ×3
ELECTRODE REM PT RTRN 9FT ADLT (ELECTROSURGICAL) ×1 IMPLANT
GAUZE 4X4 16PLY RFD (DISPOSABLE) ×3 IMPLANT
GLOVE SURG SIGNA 7.5 PF LTX (GLOVE) ×3 IMPLANT
GOWN STRL REUS W/ TWL LRG LVL3 (GOWN DISPOSABLE) ×1 IMPLANT
GOWN STRL REUS W/ TWL XL LVL3 (GOWN DISPOSABLE) ×1 IMPLANT
GOWN STRL REUS W/TWL LRG LVL3 (GOWN DISPOSABLE) ×3
GOWN STRL REUS W/TWL XL LVL3 (GOWN DISPOSABLE) ×3
KIT BASIN OR (CUSTOM PROCEDURE TRAY) ×3 IMPLANT
KIT MARKER MARGIN INK (KITS) ×3 IMPLANT
KIT TURNOVER KIT B (KITS) ×3 IMPLANT
NEEDLE HYPO 25GX1X1/2 BEV (NEEDLE) ×3 IMPLANT
NS IRRIG 1000ML POUR BTL (IV SOLUTION) ×3 IMPLANT
PACK GENERAL/GYN (CUSTOM PROCEDURE TRAY) ×3 IMPLANT
PAD ARMBOARD 7.5X6 YLW CONV (MISCELLANEOUS) ×3 IMPLANT
PENCIL SMOKE EVACUATOR (MISCELLANEOUS) ×3 IMPLANT
SUT MNCRL AB 4-0 PS2 18 (SUTURE) ×3 IMPLANT
SUT VIC AB 3-0 SH 27 (SUTURE) ×3
SUT VIC AB 3-0 SH 27X BRD (SUTURE) ×1 IMPLANT
SYR CONTROL 10ML LL (SYRINGE) ×3 IMPLANT
TOWEL GREEN STERILE (TOWEL DISPOSABLE) ×3 IMPLANT

## 2020-06-27 NOTE — Anesthesia Procedure Notes (Signed)
Procedure Name: Intubation Date/Time: 06/27/2020 11:06 AM Performed by: Orlie Dakin, CRNA Pre-anesthesia Checklist: Patient identified, Emergency Drugs available, Suction available and Patient being monitored Patient Re-evaluated:Patient Re-evaluated prior to induction Oxygen Delivery Method: Circle system utilized Preoxygenation: Pre-oxygenation with 100% oxygen Induction Type: IV induction Ventilation: Mask ventilation without difficulty Laryngoscope Size: Glidescope and 4 Grade View: Grade II Tube type: Oral Tube size: 7.0 mm Number of attempts: 1 Airway Equipment and Method: Stylet and Video-laryngoscopy Placement Confirmation: positive ETCO2,  ETT inserted through vocal cords under direct vision and breath sounds checked- equal and bilateral Secured at: 22 cm Tube secured with: Tape Dental Injury: Teeth and Oropharynx as per pre-operative assessment  Comments: LMA with poor seal, removed and then DL with Glidescope as above due to H/O limited ROM of neck.

## 2020-06-27 NOTE — Op Note (Signed)
LEFT BREAST LUMPECTOMY  Procedure Note  Alison [name] 06/27/2020   Pre-op Diagnosis: LEFT BREAST PAPILLOMA     Post-op Diagnosis: same  Procedure(s): LEFT BREAST LUMPECTOMY  Surgeon(s): Coralie Keens, MD  Anesthesia: General  Staff:  Circulator: Antonietta Jewel, RN Scrub Person: Zannie Kehr Circulator Assistant: Rozell Searing, RN  Estimated Blood Loss: Minimal               Specimens:  Sent to path  Indications: This is a 59 year old female presented with a left breast mass.  She has had 2 separate biopsies which shows fibrocystic changes and a papilloma.  The decision was made to proceed with a lumpectomy of the left breast  Procedure: The patient was brought to operating identifies correct patient.  She is placed upon the operating table and general anesthesia was induced.  Her left breast was then prepped and draped in usual sterile fashion.  The palpable mass was at the 3 to 3:30 position of the left breast at the edge of the areola.  I anesthetized skin with Marcaine and made incision with a scalpel.  I then easily visualized the mass as well as the biopsy clip and the mass.  Again this was very superficial.  I performed a lumpectomy removing the palpable mass with the cautery.  Once the specimen was removed it was marked with paint and then sent to pathology for evaluation.  I achieved hemostasis with cautery.  I anesthetized the incision further with Marcaine.  I then closed the subcutaneous tissue with interrupted 3-0 Vicryl sutures and closed the skin with a running 4-0 Monocryl.  Dermabond was applied.  The patient tolerated the procedure well.  All the counts were correct at the end of the procedure.  The patient was then extubated in the operating room and taken in stable addition to the recovery room.          Coralie Keens   Date: 06/27/2020  Time: 11:27 AM

## 2020-06-27 NOTE — Interval H&P Note (Signed)
History and Physical Interval Note:no change in H and P  06/27/2020 10:16 AM  Alison [name]  has presented today for surgery, with the diagnosis of LEFT BREAST PAPILLOM.  The various methods of treatment have been discussed with the patient and family. After consideration of risks, benefits and other options for treatment, the patient has consented to  Procedure(s) with comments: LEFT BREAST LUMPECTOMY (Left) - LMA as a surgical intervention.  The patient's history has been reviewed, patient examined, no change in status, stable for surgery.  I have reviewed the patient's chart and labs.  Questions were answered to the patient's satisfaction.     Coralie Keens

## 2020-06-27 NOTE — Anesthesia Preprocedure Evaluation (Signed)
Anesthesia Evaluation  Patient identified by MRN, date of birth, ID band Patient awake    Reviewed: Allergy & Precautions, NPO status , Patient's Chart, lab work & pertinent test results  History of Anesthesia Complications Negative for: history of anesthetic complications  Airway Mallampati: III  TM Distance: >3 FB Neck ROM: Full    Dental no notable dental hx. (+) Dental Advisory Given   Pulmonary asthma ,    Pulmonary exam normal        Cardiovascular hypertension, Pt. on medications Normal cardiovascular exam  PAT note by Karoline Caldwell, PA-C: Patient history lists heart murmur.  She had echo in 2012 that was benign, normal LVEF, only AV sclerosis without stenosis.  History of TIA at 59 years old.  In 2019 she had carotid duplex prior to having ACDF that showed bilateral 1 to 39% stenosis.  Preop labs reviewed, unremarkable.  EKG 04/12/2020: Normal sinus rhythm.  Rate 71. Low voltage QRS. Cannot rule out Anterior infarct , age undetermined  TTE 02/20/2011: Study conclusions: Left ventricle: The cavity size was normal.  Wall thickness was increased in a pattern of mild LVH.  The estimated ejection fraction was 60%.  Wall motion was normal; there were no regional wall motion normalities. Aortic valve: Sclerosis without stenosis.  Carotid US 08/18/18: Final Interpretation:  Right Carotid: Velocities in the right ICA are consistent with a 1-39%  stenosis.   Left Carotid: Velocities in the left ICA are consistent with a 1-39%  stenosis.   Vertebrals: Bilateral vertebral arteries demonstrate antegrade flow.  Subclavians: Normal flow hemodynamics were seen in bilateral subclavian        arteries.      Neuro/Psych PSYCHIATRIC DISORDERS Anxiety TIA   GI/Hepatic Neg liver ROS, GERD  ,  Endo/Other  Hypothyroidism   Renal/GU negative Renal ROS     Musculoskeletal negative musculoskeletal ROS (+)   Abdominal    Peds  Hematology negative hematology ROS (+)   Anesthesia Other Findings   Reproductive/Obstetrics                             Anesthesia Physical  Anesthesia Plan  ASA: III  Anesthesia Plan: General   Post-op Pain Management:    Induction: Intravenous  PONV Risk Score and Plan: 4 or greater and Ondansetron, Dexamethasone, Midazolam and Scopolamine patch - Pre-op  Airway Management Planned: LMA  Additional Equipment:   Intra-op Plan:   Post-operative Plan: Extubation in OR  Informed Consent: I have reviewed the patients History and Physical, chart, labs and discussed the procedure including the risks, benefits and alternatives for the proposed anesthesia with the patient or authorized representative who has indicated his/her understanding and acceptance.     Dental advisory given  Plan Discussed with: Anesthesiologist and CRNA  Anesthesia Plan Comments: (PAT note by Karoline Caldwell, PA-C: Patient history lists heart murmur.  She had echo in 2012 that was benign, normal LVEF, only AV sclerosis without stenosis.  History of TIA at 59 years old.  In 2019 she had carotid duplex prior to having ACDF that showed bilateral 1 to 39% stenosis.  Preop labs reviewed, unremarkable.  EKG 04/12/2020: Normal sinus rhythm.  Rate 71. Low voltage QRS. Cannot rule out Anterior infarct , age undetermined  TTE 02/20/2011: Study conclusions: Left ventricle: The cavity size was normal.  Wall thickness was increased in a pattern of mild LVH.  The estimated ejection fraction was 60%.  Wall motion was normal;  there were no regional wall motion normalities. Aortic valve: Sclerosis without stenosis.  Carotid US 08/18/18: Final Interpretation:  Right Carotid: Velocities in the right ICA are consistent with a 1-39%  stenosis.   Left Carotid: Velocities in the left ICA are consistent with a 1-39%  stenosis.   Vertebrals: Bilateral vertebral arteries demonstrate  antegrade flow.  Subclavians: Normal flow hemodynamics were seen in bilateral subclavian        arteries.   )        Anesthesia Quick Evaluation

## 2020-06-27 NOTE — Anesthesia Postprocedure Evaluation (Signed)
Anesthesia Post Note  Patient: Alison [name]  Procedure(s) Performed: LEFT BREAST LUMPECTOMY (Left Breast)     Patient location during evaluation: PACU Anesthesia Type: General Level of consciousness: sedated Pain management: pain level controlled Vital Signs Assessment: post-procedure vital signs reviewed and stable Respiratory status: spontaneous breathing and respiratory function stable Cardiovascular status: stable Postop Assessment: no apparent nausea or vomiting Anesthetic complications: no   No complications documented.  Last Vitals:  Vitals:   06/27/20 1145 06/27/20 1155  BP:  120/82  Pulse: 75 78  Resp: 19 18  Temp:  36.6 C  SpO2: 93% 93%    Last Pain:  Vitals:   06/27/20 1155  PainSc: 0-No pain                 Nadia Viar DANIEL

## 2020-06-27 NOTE — Discharge Instructions (Signed)
[place] Office Phone Number [phone]  BREAST BIOPSY/ PARTIAL MASTECTOMY: POST OP INSTRUCTIONS  Always review your discharge instruction sheet given to you by the facility where your surgery was performed.  IF YOU HAVE DISABILITY OR FAMILY LEAVE FORMS, YOU MUST BRING THEM TO THE OFFICE FOR PROCESSING.  DO NOT GIVE THEM TO YOUR DOCTOR.  1. A prescription for pain medication may be given to you upon discharge.  Take your pain medication as prescribed, if needed.  If narcotic pain medicine is not needed, then you may take acetaminophen (Tylenol) or ibuprofen (Advil) as needed. 2. Take your usually prescribed medications unless otherwise directed 3. If you need a refill on your pain medication, please contact your pharmacy.  They will contact our office to request authorization.  Prescriptions will not be filled after 5pm or on week-ends. 4. You should eat very light the first 24 hours after surgery, such as soup, crackers, pudding, etc.  Resume your normal diet the day after surgery. 5. Most patients will experience some swelling and bruising in the breast.  Ice packs and a good support bra will help.  Swelling and bruising can take several days to resolve.  6. It is common to experience some constipation if taking pain medication after surgery.  Increasing fluid intake and taking a stool softener will usually help or prevent this problem from occurring.  A mild laxative (Milk of Magnesia or Miralax) should be taken according to package directions if there are no bowel movements after 48 hours. 7. Unless discharge instructions indicate otherwise, you may remove your bandages 24-48 hours after surgery, and you may shower at that time.  You may have steri-strips (small skin tapes) in place directly over the incision.  These strips should be left on the skin for 7-10 days.  If your surgeon used skin glue on the incision, you may shower in 24 hours.  The glue will flake off over the  next 2-3 weeks.  Any sutures or staples will be removed at the office during your follow-up visit. 8. ACTIVITIES:  You may resume regular daily activities (gradually increasing) beginning the next day.  Wearing a good support bra or sports bra minimizes pain and swelling.  You may have sexual intercourse when it is comfortable. a. You may drive when you no longer are taking prescription pain medication, you can comfortably wear a seatbelt, and you can safely maneuver your car and apply brakes. b. RETURN TO WORK:  ______________________________________________________________________________________ 9. You should see your doctor in the office for a follow-up appointment approximately two weeks after your surgery.  Your doctors nurse will typically make your follow-up appointment when she calls you with your pathology report.  Expect your pathology report 2-3 business days after your surgery.  You may call to check if you do not hear from [place] after three days. 10. OTHER INSTRUCTIONS:OK TO SHOWER STARTING TOMORROW 11. ICE PACK AND TYLENOL ALSO FOR PAIN NO VIGOROUS ACTIVITY FOR ONE WEEK _______________________________________________________________________________________________ _____________________________________________________________________________________________________________________________________ _____________________________________________________________________________________________________________________________________ _____________________________________________________________________________________________________________________________________  WHEN TO CALL YOUR DOCTOR: 1. Fever over 101.0 2. Nausea and/or vomiting. 3. Extreme swelling or bruising. 4. Continued bleeding from incision. 5. Increased pain, redness, or drainage from the incision.  The clinic staff is available to answer your questions during regular business hours.  Please dont hesitate to call and ask to  speak to one of the nurses for clinical concerns.  If you have a medical emergency, go to the nearest emergency room or call 911.  A surgeon from  [facility] is always on call at the hospital.  For further questions, please visit [email or web address]

## 2020-06-27 NOTE — Transfer of Care (Signed)
Immediate Anesthesia Transfer of Care Note  Patient: Alison [name]  Procedure(s) Performed: LEFT BREAST LUMPECTOMY (Left Breast)  Patient Location: PACU  Anesthesia Type:General  Level of Consciousness: drowsy and patient cooperative  Airway & Oxygen Therapy: Patient Spontanous Breathing and Patient connected to face mask oxygen  Post-op Assessment: Report given to RN and Post -op Vital signs reviewed and stable  Post vital signs: Reviewed and stable  Last Vitals:  Vitals Value Taken Time  BP 115/63 06/27/20 1137  Temp    Pulse 73 06/27/20 1137  Resp 18 06/27/20 1137  SpO2 97 % 06/27/20 1137  Vitals shown include unvalidated device data.  Last Pain:  Vitals:   06/27/20 1003  PainSc: 0-No pain         Complications: No complications documented.

## 2020-06-28 ENCOUNTER — Encounter: Payer: No Typology Code available for payment source | Attending: General Surgery | Admitting: Skilled Nursing Facility1

## 2020-06-28 ENCOUNTER — Encounter (HOSPITAL_COMMUNITY): Payer: Self-pay | Admitting: Surgery

## 2020-06-28 ENCOUNTER — Other Ambulatory Visit: Payer: Self-pay

## 2020-06-28 DIAGNOSIS — E669 Obesity, unspecified: Secondary | ICD-10-CM | POA: Diagnosis not present

## 2020-06-28 NOTE — Progress Notes (Signed)
Bariatric Nutrition Follow-Up Visit Medical Nutrition Therapy   Pt given star previously:   NUTRITION ASSESSMENT    Anthropometrics  Surgery date: 05/03/2020 Surgery type: lap band to sleeve Start weight at [facility]: 234.5 Weight today: 204.8   Body Composition Scale 05/17/2020 06/28/2020  Total Body Fat % 44.9 43  Visceral Fat 17 15  Fat-Free Mass % 55 56.9   Total Body Water % 42 42.9   Muscle-Mass lbs 28.1 28.3  Body Fat Displacement           Torso  lbs 60.9 54.5         Left Leg  lbs 12.1 10.9         Right Leg  lbs 12.1 10.9         Left Arm  lbs 6 5.4         Right Arm   lbs 6 5.4    Clinical  Medical hx:  Medications:  Labs:    Lifestyle & Dietary Hx  Pt state she is scared to add in non starchy vegetables for fear of gaining weight. Pt states she really likes spin but does not have the availability.  Pt states she got a lumpectomy in her breast yesterday and feels she is doing well.   Estimated daily fluid intake: 50.7 oz Estimated daily protein intake: 60+ g Supplements: opurity and calcium Current average weekly physical activity: walking her dog and yard work  24-Hr Dietary Recall First Meal: protein shake Snack:  Second Meal: [place] burger + cheese or tuna + mayo or [place] + cheese Snack:  Third Meal: [place] burger + cheese or tuna + mayo or [place] + cheese or salmon or shrimp  Snack:  Beverages: water + flavoring, coke zero  Post-Op Goals/ Signs/ Symptoms Using straws: no Drinking while eating: no Chewing/swallowing difficulties: no Changes in vision: no Changes to mood/headaches: no Hair loss/changes to skin/nails: no Difficulty focusing/concentrating: no Sweating: no Dizziness/lightheadedness: no Palpitations: no  Carbonated/caffeinated beverages: no N/V/D/C/Gas: bowel movement every 3 days Abdominal pain: no Dumping syndrome: no    NUTRITION DIAGNOSIS  Overweight/obesity ([place]-3.3) related to past poor dietary habits and physical  inactivity as evidenced by completed bariatric surgery and following dietary guidelines for continued weight loss and healthy nutrition status.     NUTRITION INTERVENTION Nutrition counseling (C-1) and education (E-2) to facilitate bariatric surgery goals, including: . Diet advancement to the next phase (phase 4) now including non starchy vegetables  . The importance of consuming adequate calories as well as certain nutrients daily due to the body's need for essential vitamins, minerals, and fats . The importance of daily physical activity and to reach a goal of at least 150 minutes of moderate to vigorous physical activity weekly (or as directed by their physician) due to benefits such as increased musculature and improved lab values . The importance of intuitive eating specifically learning hunger-satiety cues and understanding the importance of learning a new body  Goals: -Continue to aim for a minimum of 64 fluid ounces 7 days a week with at least 30 ounces being plain water  -Eat non-starchy vegetables 2 times a day 7 days a week  -Start out with soft cooked vegetables today and tomorrow; if tolerated begin to eat raw vegetables or cooked including salads  -Eat your 3 ounces of protein first then start in on your non-starchy vegetables; once you understand how much of your meal leads to satisfaction and not full while still eating 3 ounces of protein and  non-starchy vegetables you can eat them in any order   -Continue to aim for 30 minutes of activity at least 5 times a week  -Do NOT cook with/add to your food: alfredo sauce, cheese sauce, barbeque sauce, ketchup, fat back, butter, bacon grease, grease, Crisco, OR SUGAR   Handouts Provided Include   Phase 4  Learning Style & Readiness for Change Teaching method utilized: Visual & Auditory  Demonstrated degree of understanding via: Teach Back  Barriers to learning/adherence to lifestyle change: none identified   RD's Notes for  Next Visit . Assess adherence to pt chosen goals   MONITORING & EVALUATION Dietary intake, weekly physical activity, body weight   Next Steps Patient is to follow-up in 3 months

## 2020-06-30 LAB — SURGICAL PATHOLOGY

## 2020-08-02 ENCOUNTER — Ambulatory Visit: Payer: 59 | Admitting: Internal Medicine

## 2020-08-23 DIAGNOSIS — Z9884 Bariatric surgery status: Secondary | ICD-10-CM | POA: Diagnosis not present

## 2020-08-23 DIAGNOSIS — K912 Postsurgical malabsorption, not elsewhere classified: Secondary | ICD-10-CM | POA: Diagnosis not present

## 2020-08-23 DIAGNOSIS — E669 Obesity, unspecified: Secondary | ICD-10-CM | POA: Diagnosis not present

## 2020-08-23 DIAGNOSIS — R69 Illness, unspecified: Secondary | ICD-10-CM | POA: Diagnosis not present

## 2020-09-05 ENCOUNTER — Other Ambulatory Visit: Payer: Self-pay

## 2020-09-05 ENCOUNTER — Ambulatory Visit (INDEPENDENT_AMBULATORY_CARE_PROVIDER_SITE_OTHER): Payer: No Typology Code available for payment source | Admitting: Internal Medicine

## 2020-09-05 ENCOUNTER — Encounter: Payer: Self-pay | Admitting: Internal Medicine

## 2020-09-05 VITALS — BP 130/80 | HR 65 | Ht 62.5 in | Wt 195.4 lb

## 2020-09-05 DIAGNOSIS — E890 Postprocedural hypothyroidism: Secondary | ICD-10-CM | POA: Diagnosis not present

## 2020-09-05 NOTE — Progress Notes (Signed)
Name: Alison [name]  MRN/ DOB: [id number], 13-Jan-1961    Age/ Sex: 59 y.o., female     PCP: Leamon Arnt, MD   Reason for Endocrinology Evaluation:  Post ablative hypothyroidism     Initial Endocrinology Clinic Visit: 11/03/2019    PATIENT IDENTIFIER: Alison [name] is a 59 y.o., female with a past medical history of HTN, hx of gastric banding, hx of hyperthyroidism S/P RAI  ablation and dyslipidemia. She has followed with [facility] since 11/03/2019 for consultative assistance with management of her post ablative hypothyroidism.   HISTORICAL SUMMARY: The patient was first diagnosed with hyperthyroidism, she is s/p RAI ablation over 15 years ago.  She does not recall a diagnosis of Graves' disease.  She required LT-4 replacement therapy shortly after RAI ablation.   Sometime in 2020 she has been noted to have low TSH, requiring gradual reduction in LT-4 replacement.   Mother with hyperthyroidism  She works as a Marine scientist SUBJECTIVE:   Today (09/05/2020):  Ms. Cast is here for a 41-month follow-up on post ablative hypothyroidism.  She is on levothyroxine 75 mcg Mon- Sat. 37.5 mcg on Sunday  She has had gastric sleeve sx since the last visit here.   Denies fatigue  Denies diarrhea or palpitations Denies depression and anxiety  She is on Biotin    HISTORY:  Past Medical History:  Past Medical History:  Diagnosis Date  . Anemia    prior to hysterectomy   . Anxiety   . Arthritis    LEFT KNEE  . Asthma   . Cervical myelopathy ([place]) 08/28/2018  . Family history of adverse reaction to anesthesia    Sister PONV  . GERD (gastroesophageal reflux disease)   . Heart murmur   . Hx-TIA (transient ischemic attack) 1992  . Hyperlipidemia   . Hypertension   . Hypothyroidism   . Obesity   . Pneumonia   . Polycystic ovarian syndrome    Past Surgical History:  Past Surgical History:  Procedure Laterality Date  . ABDOMINAL HYSTERECTOMY  2009   Partial  .  ANTERIOR CERVICAL DECOMPRESSION/DISCECTOMY FUSION 4 LEVELS N/A 08/28/2018   Procedure: ANTERIOR CERVICAL DECOMPRESSION FUSION, CERVICAL 4-5, CERVICAL 5-6, CERVICAL 6-7 WITH INSTRUMENTATION AND ALLOGRAFT;  Surgeon: Phylliss Bob, MD;  Location: [place];  Service: Orthopedics;  Laterality: N/A;  . BREAST LUMPECTOMY Left 06/27/2020   Procedure: LEFT BREAST LUMPECTOMY;  Surgeon: Coralie Keens, MD;  Location: [place];  Service: General;  Laterality: Left;  LMA  . DIAGNOSTIC LAPAROSCOPY    . FEMUR FRACTURE SURGERY  2008   Left  . FRACTURE SURGERY    . I & D EXTREMITY Left 06/01/2018   Procedure: IRRIGATION AND DEBRIDEMENT LEFT CALF;  Surgeon: Mcarthur Rossetti, MD;  Location: [place];  Service: Orthopedics;  Laterality: Left;  . KNEE CARTILAGE SURGERY  2005  . LAPAROSCOPIC GASTRIC BAND REMOVAL WITH LAPAROSCOPIC GASTRIC SLEEVE RESECTION N/A 05/03/2020   Procedure: LAPAROSCOPIC GASTRIC BAND REMOVAL WITH LAPAROSCOPIC GASTRIC SLEEVE RESECTION; UPPER ENDO AND ERAS PATHWAY;  Surgeon: Kinsinger, Arta Bruce, MD;  Location: [facility];  Service: General;  Laterality: N/A;  . LAPAROSCOPIC GASTRIC BANDING  11/12/2011   Procedure: LAPAROSCOPIC GASTRIC BANDING;  Surgeon: Judieth Keens, DO;  Location: [facility];  Service: General;  Laterality: N/A;  nathanson liver retractor/ endostitch available 2-0 surgidac refills/ [place] needle drivers Economist to Pensions consultant  . TONSILLECTOMY  2007    Social History:  reports that she has never smoked.  She has never used smokeless tobacco. She reports previous alcohol use. She reports that she does not use drugs. Family History:  Family History  Problem Relation Age of Onset  . Glaucoma Mother   . Hyperthyroidism Mother   . Heart disease Father   . Stroke Father   . Anesthesia problems Sister   . Migraines Sister   . Cancer Maternal Aunt        Breast Cancer  . Breast cancer Maternal Aunt 73  . Ovarian cancer Maternal Grandmother   . Uterine cancer  Maternal Grandmother   . Myasthenia gravis Paternal Grandfather      HOME MEDICATIONS: Allergies as of 09/05/2020      Reactions   Aspirin Anaphylaxis   Cherry Hives, Shortness Of Breath   Other Hives, Itching   Walnuts and pecans - cause itching and hives in mouth   Soy Allergy Other (See Comments)   " hives in mouth and wheezing"    Penicillins Itching   Has patient had a PCN reaction causing immediate rash, facial/tongue/throat swelling, SOB or lightheadedness with hypotension: No Has patient had a PCN reaction causing severe rash involving mucus membranes or skin necrosis: No Has patient had a PCN reaction that required hospitalization: No Has patient had a PCN reaction occurring within the last 10 years: No If all of the above answers are "NO", then may proceed with Cephalosporin use.      Medication List       Accurate as of September 05, 2020  9:47 AM. If you have any questions, ask your nurse or doctor.        albuterol 108 (90 Base) MCG/ACT inhaler Commonly known as: VENTOLIN HFA Inhale 2 puffs into the lungs every 6 (six) hours as needed. For asthma What changed:   reasons to take this  additional instructions   ALPRAZolam 0.25 MG tablet Commonly known as: XANAX Take 1 tablet (0.25 mg total) by mouth at bedtime as needed for anxiety or sleep.   amLODipine 5 MG tablet Commonly known as: NORVASC Take 1 tablet (5 mg total) by mouth daily.   calcium carbonate 1250 (500 Ca) MG chewable tablet Commonly known as: OS-CAL Chew 1 tablet by mouth 3 (three) times daily.   cetirizine 10 MG tablet Commonly known as: ZYRTEC Take 10 mg by mouth at bedtime as needed (itching).   escitalopram 10 MG tablet Commonly known as: LEXAPRO TAKE 1 TABLET BY MOUTH EVERY DAY What changed:   how much to take  how to take this  when to take this   levothyroxine 75 MCG tablet Commonly known as: SYNTHROID TAKE 1 TABLET BY MOUTH EVERY DAY What changed: when to take this     Opurity B12/Folic Acid 8338-250 MCG Tabs Take 1 tablet by mouth daily.   pantoprazole 40 MG tablet Commonly known as: PROTONIX Take 1 tablet (40 mg total) by mouth daily for 60 doses.   rOPINIRole 3 MG tablet Commonly known as: REQUIP Take 1 tablet (3 mg total) by mouth at bedtime. What changed:   when to take this  reasons to take this   rosuvastatin 40 MG tablet Commonly known as: CRESTOR Take 1 tablet (40 mg total) by mouth at bedtime.   traMADol 50 MG tablet Commonly known as: ULTRAM Take 1 tablet (50 mg total) by mouth every 6 (six) hours as needed.   traZODone 50 MG tablet Commonly known as: DESYREL Take 1-2 tablets (50-100 mg total) by mouth at bedtime as needed for  sleep.         OBJECTIVE:   PHYSICAL EXAM: VS: BP 130/80   Pulse 65   Ht 5' 2.5" (1.588 m)   Wt 195 lb 6.4 oz (88.6 kg)   SpO2 96%   BMI 35.17 kg/m     EXAM: General: Pt appears well and is in NAD  Neck: General: Supple without adenopathy. Thyroid: Thyroid size normal.  No goiter or nodules appreciated. No thyroid bruit.  Lungs: Clear with good BS bilat with no rales, rhonchi, or wheezes  Heart: Auscultation: RRR. No edema in lower or upper extremities  Abdomen: Normoactive bowel sounds, soft, nontender, without masses or organomegaly palpable  Extremities: No edema      DATA REVIEWED: Results for AZIZA, STUCKERT (MRN [id number]) as of 09/06/2020 12:26  Ref. Range 03/29/2020 15:34  TSH Latest Ref Range: 0.35 - 4.50 uIU/mL 7.70 (H)  T4,Free(Direct) Latest Ref Range: 0.60 - 1.60 ng/dL 0.70     ASSESSMENT / PLAN / RECOMMENDATIONS:   1.Postablative Hypothyroidism :   -Patient is clinically euthyroid -No local neck symptoms - We are unable to check TFT's today as she is on Biotin, she will hold this for 3 days    Medications   Continue levothyroxine 75 MCG Monday through Saturday and Half a tablet on sundays    Follow-up in 1 yr   Signed electronically by: Mack Guise, MD  [facility] End[facility]inology  [facility] [address] [place], [place] [address] Phone: [phone] FAX: [phone]      CC: Leamon Arnt, [place] [address] Phone: [phone]  Fax: [phone]   Return to Endocrinology clinic as below: Future Appointments  Date Time Provider [place]  09/05/2020  3:00 PM Shamleffer, Melanie Crazier, MD [facility] None  10/11/2020  9:30 AM Leamon Arnt, MD [facility]

## 2020-09-05 NOTE — Patient Instructions (Signed)
-   HOLD Biotin for 2-3 days before any thyroid check in the future

## 2020-09-08 ENCOUNTER — Other Ambulatory Visit: Payer: Self-pay

## 2020-09-08 ENCOUNTER — Other Ambulatory Visit (INDEPENDENT_AMBULATORY_CARE_PROVIDER_SITE_OTHER): Payer: No Typology Code available for payment source

## 2020-09-08 DIAGNOSIS — E890 Postprocedural hypothyroidism: Secondary | ICD-10-CM

## 2020-09-08 LAB — TSH: TSH: 14.33 u[IU]/mL — ABNORMAL HIGH (ref 0.35–4.50)

## 2020-09-12 ENCOUNTER — Telehealth: Payer: Self-pay | Admitting: Internal Medicine

## 2020-09-12 DIAGNOSIS — E890 Postprocedural hypothyroidism: Secondary | ICD-10-CM

## 2020-09-12 MED ORDER — LEVOTHYROXINE SODIUM 75 MCG PO TABS: 75.0000 ug | ORAL_TABLET | Freq: Every day | ORAL | 1 refills | Status: DC

## 2020-09-12 NOTE — Telephone Encounter (Signed)
TSh elevated at 14 uIU/mL,    Will increase levothyroxine 75 mcg , to 1 tablet daily   Recheck in 6 weeks   [place], MD  [facility] Endocrinology  [facility] [address] [place], [place] [address] Phone: [phone] FAX: [phone] ]

## 2020-09-16 DIAGNOSIS — H524 Presbyopia: Secondary | ICD-10-CM | POA: Diagnosis not present

## 2020-09-27 ENCOUNTER — Other Ambulatory Visit: Payer: Self-pay | Admitting: Family Medicine

## 2020-10-11 ENCOUNTER — Other Ambulatory Visit (HOSPITAL_COMMUNITY)
Admission: RE | Admit: 2020-10-11 | Discharge: 2020-10-11 | Disposition: A | Discharge status: Home / Self Care | Payer: No Typology Code available for payment source | Source: Ambulatory Visit | Attending: Family Medicine | Admitting: Family Medicine

## 2020-10-11 ENCOUNTER — Other Ambulatory Visit: Payer: Self-pay

## 2020-10-11 ENCOUNTER — Encounter: Payer: Self-pay | Admitting: Family Medicine

## 2020-10-11 ENCOUNTER — Ambulatory Visit (INDEPENDENT_AMBULATORY_CARE_PROVIDER_SITE_OTHER): Payer: No Typology Code available for payment source | Admitting: Family Medicine

## 2020-10-11 VITALS — BP 110/72 | HR 74 | Temp 98.2°F | Resp 16 | Ht 63.0 in | Wt 189.6 lb

## 2020-10-11 DIAGNOSIS — Z0000 Encounter for general adult medical examination without abnormal findings: Secondary | ICD-10-CM | POA: Diagnosis not present

## 2020-10-11 DIAGNOSIS — Z124 Encounter for screening for malignant neoplasm of cervix: Secondary | ICD-10-CM

## 2020-10-11 DIAGNOSIS — Z23 Encounter for immunization: Secondary | ICD-10-CM

## 2020-10-11 DIAGNOSIS — K219 Gastro-esophageal reflux disease without esophagitis: Secondary | ICD-10-CM

## 2020-10-11 DIAGNOSIS — I10 Essential (primary) hypertension: Secondary | ICD-10-CM

## 2020-10-11 DIAGNOSIS — E890 Postprocedural hypothyroidism: Secondary | ICD-10-CM

## 2020-10-11 DIAGNOSIS — Z903 Acquired absence of stomach [part of]: Secondary | ICD-10-CM

## 2020-10-11 DIAGNOSIS — E038 Other specified hypothyroidism: Secondary | ICD-10-CM | POA: Diagnosis not present

## 2020-10-11 DIAGNOSIS — R69 Illness, unspecified: Secondary | ICD-10-CM | POA: Diagnosis not present

## 2020-10-11 DIAGNOSIS — G2581 Restless legs syndrome: Secondary | ICD-10-CM | POA: Diagnosis not present

## 2020-10-11 DIAGNOSIS — F411 Generalized anxiety disorder: Secondary | ICD-10-CM | POA: Diagnosis not present

## 2020-10-11 DIAGNOSIS — Z9884 Bariatric surgery status: Secondary | ICD-10-CM | POA: Diagnosis not present

## 2020-10-11 DIAGNOSIS — F5101 Primary insomnia: Secondary | ICD-10-CM

## 2020-10-11 DIAGNOSIS — E782 Mixed hyperlipidemia: Secondary | ICD-10-CM

## 2020-10-11 MED ORDER — ROSUVASTATIN CALCIUM 40 MG PO TABS: 40.0000 mg | ORAL_TABLET | Freq: Every day | ORAL | 3 refills | Status: DC

## 2020-10-11 MED ORDER — AMLODIPINE BESYLATE 5 MG PO TABS: 5.0000 mg | ORAL_TABLET | Freq: Every day | ORAL | 3 refills | Status: DC

## 2020-10-11 MED ORDER — ROPINIROLE HCL 3 MG PO TABS: 3.0000 mg | ORAL_TABLET | Freq: Every day | ORAL | 3 refills | Status: DC

## 2020-10-11 MED ORDER — ESCITALOPRAM OXALATE 10 MG PO TABS: 10.0000 mg | ORAL_TABLET | Freq: Every day | ORAL | 3 refills | Status: DC

## 2020-10-11 MED ORDER — PANTOPRAZOLE SODIUM 40 MG PO TBEC: 40.0000 mg | DELAYED_RELEASE_TABLET | Freq: Every day | ORAL | 1 refills | Status: DC

## 2020-10-11 NOTE — Progress Notes (Signed)
Subjective  Chief Complaint  Patient presents with  . Annual Exam    fasting  . Gastroesophageal Reflux    mostly at night, worsening over the past two months   . Gynecologic Exam  . Health Maintenance    flu shot given in office today    HPI: Alison [name] is a 59 y.o. female who presents to [facility] [place] today for a Female Wellness Visit. She also has the concerns and/or needs as listed above in the chief complaint. These will be addressed in addition to the Health Maintenance Visit.   Wellness Visit: annual visit with health maintenance review and exam with Pap   HM; due pap smear. mammog and CRC up to date. due flu vaccine.weight is down.  Chronic disease f/u and/or acute problem visit: (deemed necessary to be done in addition to the wellness visit):  Had gastric band removal and gastric sleeve in may: losing weight doing well but for increasing GERD; had been on protonix but switched to prilosec and now having moderate to severe sxs of reflux. No hematemesis or atypical pain.   Hypertension on amlodipine.  Reports good control.  No adverse effects.  Hyperlipidemia due for recheck on Crestor.  Tolerating statin well.  Fasting today.  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  Medications have been adjusted and it is improving.  Due for recheck.  Restless leg syndrome is well controlled and she needs a refill on her ropinirole   Assessment  1. Annual physical exam   2. Mixed hyperlipidemia   3. Essential hypertension with goal blood pressure less than 140/90   4. Post-therapeutic hypothyroidism   5. GAD (generalized anxiety disorder)   6. Restless legs syndrome (RLS)   7. Primary insomnia   8. Cervical cancer screening   9. Gastroesophageal reflux disease, unspecified whether esophagitis present   10. S/P gastric sleeve procedure   11. Gastric banding status      Plan  Female  Wellness Visit:  Age appropriate Health Maintenance and Prevention measures were discussed with patient. Included topics are cancer screening recommendations, ways to keep healthy (see AVS) including dietary and exercise recommendations, regular eye and dental care, use of seat belts, and avoidance of moderate alcohol use and tobacco use.  Pap smear today with high risk HPV typing  BMI: discussed patient's BMI and encouraged positive lifestyle modifications to help get to or maintain a target BMI.  HM needs and immunizations were addressed and ordered. See below for orders. See HM and immunization section for updates.  Routine labs and screening tests ordered including cmp, cbc and lipids where appropriate.  Discussed recommendations regarding Vit D and calcium supplementation (see AVS)  Chronic disease management visit and/or acute problem visit:  Chronic problems are well controlled.  Check renal function, lipids, liver function.  Refill medications.  See medication list which was updated today.  Worsening GERD symptoms after gastric sleeve: Change back to high-dose Protonix and monitor.  Continue current medications.  Mood is well controlled.  Following thyroid levels.  She has been compliant with her medications.  Obesity: Continues to improve.  Monitor for vitamin deficiencies   Follow up: 6 months for hypertension recheck Orders Placed This Encounter  Procedures  . B12 and Folate Panel  . Iron, TIBC and Ferritin Panel  . Lipid panel  . CBC with Differential/Platelet  . TSH  . COMPLETE METABOLIC PANEL WITH GFR  .  VITAMIN D 25 Hydroxy (Vit-D Deficiency, Fractures)   Meds ordered this encounter  Medications  . escitalopram (LEXAPRO) 10 MG tablet    Sig: Take 1 tablet (10 mg total) by mouth daily.    Dispense:  90 tablet    Refill:  3  . rOPINIRole (REQUIP) 3 MG tablet    Sig: Take 1 tablet (3 mg total) by mouth at bedtime.    Dispense:  90 tablet    Refill:  3  .  rosuvastatin (CRESTOR) 40 MG tablet    Sig: Take 1 tablet (40 mg total) by mouth at bedtime.    Dispense:  90 tablet    Refill:  3  . amLODipine (NORVASC) 5 MG tablet    Sig: Take 1 tablet (5 mg total) by mouth daily.    Dispense:  90 tablet    Refill:  3  . pantoprazole (PROTONIX) 40 MG tablet    Sig: Take 1 tablet (40 mg total) by mouth daily.    Dispense:  90 tablet    Refill:  1      Lifestyle: Body mass index is 33.59 kg/m. Wt Readings from Last 3 Encounters:  10/11/20 189 lb 9.6 oz (86 kg)  09/05/20 195 lb 6.4 oz (88.6 kg)  06/28/20 204 lb 12.8 oz (92.9 kg)     Patient Active Problem List   Diagnosis Date Noted  . Postablative hypothyroidism 02/03/2020    Priority: High  . Systolic murmur 54/08/8118    Priority: High    Consider repeat echo if sxs or if loudening   . GAD (generalized anxiety disorder) 06/24/2018    Priority: High    Overview:  Did well on paxil but stopped due to weight gain; wellbutrin - failed after 6 months. stabilized on lexapro.   . Mixed hyperlipidemia 06/24/2018    Priority: High  . Essential hypertension with goal blood pressure less than 140/90 05/31/2018    Priority: High    Overview:  Nl Echo 02/2011   . Family history of premature CAD 12/29/2013    Priority: High    Overview:  Father, 40 yo   . History of TIA (transient ischemic attack) 12/10/1980    Priority: High    When on OCPs   . Gastroesophageal reflux disease 10/11/2020    Priority: Medium  . S/P gastric sleeve procedure 10/11/2020    Priority: Medium  . Mild intermittent asthma without complication 14/78/2956    Priority: Medium    Overview:  Negative sleep study, 04/2011   . Primary osteoarthritis of both knees 09/11/2016    Priority: Medium    Overview:  Dr. Noemi Chapel   . Gastric banding status 10/07/2015    Priority: Medium    Removed 2021; converted to gastric sleeve   . Restless legs syndrome (RLS) 02/14/2015    Priority: Medium  . Insomnia 12/29/2013     Priority: Medium    Overview:  Failed lunesta and ambien and trazadone; Uses xanax rarely    Health Maintenance  Topic Date Due  . INFLUENZA VACCINE  07/10/2020  . PAP SMEAR-Modifier  12/10/2020  . MAMMOGRAM  04/15/2021  . TETANUS/TDAP  12/10/2021  . COLONOSCOPY  10/24/2025  . COVID-19 Vaccine  Completed  . Hepatitis C Screening  Completed  . HIV Screening  Completed   Immunization History  Administered Date(s) Administered  . Influenza, Quadrivalent, Recombinant, Inj, Pf 09/17/2019  . Influenza, Seasonal, Injecte, Preservative Fre 08/20/2014, 09/12/2015  . Influenza,inj,Quad PF,6+ Mos 09/11/2016, 09/25/2017, 09/24/2018, 09/18/2019  .  Moderna SARS-COVID-2 Vaccination 02/09/2020, 03/08/2020  . Pneumococcal Polysaccharide-23 12/10/2012  . Tdap 12/11/2011   We updated and reviewed the patient's past history in detail and it is documented below. Allergies: Patient is allergic to aspirin, cherry, other, soy allergy, and penicillins. Past Medical History Patient  has a past medical history of Anemia, Anxiety, Arthritis, Asthma, Cervical myelopathy ([place]) (08/28/2018), Family history of adverse reaction to anesthesia, GERD (gastroesophageal reflux disease), Heart murmur, TIA (transient ischemic attack) (1992), Hyperlipidemia, Hypertension, Hypothyroidism, Obesity, Pneumonia, and Polycystic ovarian syndrome. Past Surgical History Patient  has a past surgical history that includes Knee cartilage surgery (2005); Abdominal hysterectomy (2009); Tonsillectomy (2007); Femur fracture surgery (2008); Laparoscopic gastric banding (11/12/2011); I & D extremity (Left, 06/01/2018); Anterior cervical decompression/discectomy fusion 4 level (N/A, 08/28/2018); Laparoscopic gastric band removal with laparoscopic gastric sleeve resection (N/A, 05/03/2020); Fracture surgery; Diagnostic laparoscopy; and Breast lumpectomy (Left, 06/27/2020). Family History: Patient family history includes Anesthesia problems in  her sister; Breast cancer (age of onset: 35) in her maternal aunt; Cancer in her maternal aunt; Glaucoma in her mother; Heart disease in her father; Hyperthyroidism in her mother; Migraines in her sister; Myasthenia gravis in her paternal grandfather; Ovarian cancer in her maternal grandmother; Stroke in her father; Uterine cancer in her maternal grandmother. Social History:  Patient  reports that she has never smoked. She has never used smokeless tobacco. She reports previous alcohol use. She reports that she does not use drugs.  Review of Systems: Constitutional: negative for fever or malaise Ophthalmic: negative for photophobia, double vision or loss of vision Cardiovascular: negative for chest pain, dyspnea on exertion, or new LE swelling Respiratory: negative for SOB or persistent cough Gastrointestinal: negative for abdominal pain, change in bowel habits or melena Genitourinary: negative for dysuria or gross hematuria, no abnormal uterine bleeding or disharge Musculoskeletal: negative for new gait disturbance or muscular weakness Integumentary: negative for new or persistent rashes, no breast lumps Neurological: negative for TIA or stroke symptoms Psychiatric: negative for SI or delusions Allergic/Immunologic: negative for hives  Patient Care Team    Relationship Specialty Notifications Start End  Leamon Arnt, MD PCP - General Family Medicine  06/24/18     Objective  Vitals: BP 110/72   Pulse 74   Temp 98.2 F (36.8 C) (Temporal)   Resp 16   Ht 5\' 3"  (1.6 m)   Wt 189 lb 9.6 oz (86 kg)   SpO2 97%   BMI 33.59 kg/m  General:  Well developed, well nourished, no acute distress  Psych:  Alert and orientedx3,normal mood and affect HEENT:  Normocephalic, atraumatic, non-icteric sclera,  supple neck without adenopathy, mass or thyromegaly Cardiovascular:  Normal S1, S2, RRR without gallop, rub or murmur Respiratory:  Good breath sounds bilaterally, CTAB with normal respiratory  effort Gastrointestinal: normal bowel sounds, soft, non-tender, no noted masses. No HSM, no epigastric tenderness MSK: no deformities, contusions. Joints are without erythema or swelling.  Skin:  Warm, no rashes or suspicious lesions noted Neurologic:    Mental status is normal. CN 2-11 are normal. Gross motor and sensory exams are normal. Normal gait. No tremor Breast Exam: Patient deferred Pelvic Exam: Normal external genitalia, no vulvar or vaginal lesions present.  Vaginal atrophy present clear stenotic cervix w/o CMT. Bimanual exam reveals a nontender fundus w/o masses, nl size. No adnexal masses present. No inguinal adenopathy. A PAP smear was performed.    Commons side effects, risks, benefits, and alternatives for medications and treatment plan prescribed today were discussed, and  the patient expressed understanding of the given instructions. Patient is instructed to call or message via MyChart if he/she has any questions or concerns regarding our treatment plan. No barriers to understanding were identified. We discussed Red Flag symptoms and signs in detail. Patient expressed understanding regarding what to do in case of urgent or emergency type symptoms.   Medication list was reconciled, printed and provided to the patient in AVS. Patient instructions and summary information was reviewed with the patient as documented in the AVS. This note was prepared with assistance of Dragon voice recognition software. Occasional wrong-word or sound-a-like substitutions may have occurred due to the inherent limitations of voice recognition software  This visit occurred during the SARS-CoV-2 public health emergency.  Safety protocols were in place, including screening questions prior to the visit, additional usage of staff PPE, and extensive cleaning of exam room while observing appropriate contact time as indicated for disinfecting solutions.

## 2020-10-11 NOTE — Patient Instructions (Signed)
Please return in 6 months for hypertension follow up.  I will release your lab results to you on your MyChart account with further instructions. Please reply with any questions.   If you have any questions or concerns, please don't hesitate to send me a message via MyChart or call the office at [phone]. Thank you for visiting with [place] today! It's our pleasure caring for you.

## 2020-10-12 LAB — COMPLETE METABOLIC PANEL WITH GFR
AG Ratio: 2.1 (calc) (ref 1.0–2.5)
ALT: 15 U/L (ref 6–29)
AST: 23 U/L (ref 10–35)
Albumin: 3.8 g/dL (ref 3.6–5.1)
Alkaline phosphatase (APISO): 86 U/L (ref 37–153)
BUN: 8 mg/dL (ref 7–25)
CO2: 24 mmol/L (ref 20–32)
Calcium: 9.9 mg/dL (ref 8.6–10.4)
Chloride: 108 mmol/L (ref 98–110)
Creat: 0.79 mg/dL (ref 0.50–1.05)
GFR, Est African American: 96 mL/min/{1.73_m2} (ref >=60)
GFR, Est Non African American: 83 mL/min/{1.73_m2} (ref >=60)
Globulin: 1.8 g/dL (calc) — ABNORMAL LOW (ref 1.9–3.7)
Glucose, Bld: 79 mg/dL (ref 65–99)
Potassium: 3.8 mmol/L (ref 3.5–5.3)
Sodium: 142 mmol/L (ref 135–146)
Total Bilirubin: 0.4 mg/dL (ref 0.2–1.2)
Total Protein: 5.6 g/dL — ABNORMAL LOW (ref 6.1–8.1)

## 2020-10-12 LAB — CBC WITH DIFFERENTIAL/PLATELET
Absolute Monocytes: 418 cells/uL (ref 200–950)
Basophils Absolute: 30 cells/uL (ref 0–200)
Basophils Relative: 0.4 %
Eosinophils Absolute: 198 cells/uL (ref 15–500)
Eosinophils Relative: 2.6 %
HCT: 43.2 % (ref 35.0–45.0)
Hemoglobin: 14.2 g/dL (ref 11.7–15.5)
Lymphs Abs: 2067 cells/uL (ref 850–3900)
MCH: 30.3 pg (ref 27.0–33.0)
MCHC: 32.9 g/dL (ref 32.0–36.0)
MCV: 92.3 fL (ref 80.0–100.0)
MPV: 10.4 fL (ref 7.5–12.5)
Monocytes Relative: 5.5 %
Neutro Abs: 4887 cells/uL (ref 1500–7800)
Neutrophils Relative %: 64.3 %
Platelets: 211 10*3/uL (ref 140–400)
RBC: 4.68 10*6/uL (ref 3.80–5.10)
RDW: 13.5 % (ref 11.0–15.0)
Total Lymphocyte: 27.2 %
WBC: 7.6 10*3/uL (ref 3.8–10.8)

## 2020-10-12 LAB — LIPID PANEL
Cholesterol: 166 mg/dL (ref <200)
HDL: 54 mg/dL (ref >=50)
LDL Cholesterol (Calc): 95 mg/dL (calc)
Non-HDL Cholesterol (Calc): 112 mg/dL (calc) (ref <130)
Total CHOL/HDL Ratio: 3.1 (calc) (ref <5.0)
Triglycerides: 77 mg/dL (ref <150)

## 2020-10-12 LAB — IRON,TIBC AND FERRITIN PANEL
%SAT: 37 % (calc) (ref 16–45)
Ferritin: 74 ng/mL (ref 16–232)
Iron: 112 ug/dL (ref 45–160)
TIBC: 304 mcg/dL (calc) (ref 250–450)

## 2020-10-12 LAB — B12 AND FOLATE PANEL
Folate: 8.4 ng/mL
Vitamin B-12: 532 pg/mL (ref 200–1100)

## 2020-10-12 LAB — TSH: TSH: 5.77 mIU/L — ABNORMAL HIGH (ref 0.40–4.50)

## 2020-10-12 LAB — VITAMIN D 25 HYDROXY (VIT D DEFICIENCY, FRACTURES): Vit D, 25-Hydroxy: 30 ng/mL (ref 30–100)

## 2020-10-13 LAB — CYTOLOGY - PAP
Comment: NEGATIVE
Diagnosis: NEGATIVE
High risk HPV: NEGATIVE

## 2021-02-07 DIAGNOSIS — M2242 Chondromalacia patellae, left knee: Secondary | ICD-10-CM | POA: Diagnosis not present

## 2021-04-11 ENCOUNTER — Ambulatory Visit: Payer: No Typology Code available for payment source | Admitting: Family Medicine

## 2021-04-18 ENCOUNTER — Other Ambulatory Visit: Payer: Self-pay | Admitting: Family Medicine

## 2021-04-18 NOTE — Telephone Encounter (Signed)
Schedule an appointment for further refills. 

## 2021-04-24 DIAGNOSIS — D210 Benign neoplasm of connective and other soft tissue of head, face and neck: Secondary | ICD-10-CM | POA: Diagnosis not present

## 2021-04-26 ENCOUNTER — Encounter: Payer: Self-pay | Admitting: Family Medicine

## 2021-05-02 ENCOUNTER — Other Ambulatory Visit: Payer: Self-pay | Admitting: Internal Medicine

## 2021-05-02 NOTE — Telephone Encounter (Signed)
Please advise. Patient had her lab done with her pcp in 10/2020

## 2021-05-08 DIAGNOSIS — R051 Acute cough: Secondary | ICD-10-CM | POA: Diagnosis not present

## 2021-05-08 DIAGNOSIS — U071 COVID-19: Secondary | ICD-10-CM | POA: Diagnosis not present

## 2021-05-08 DIAGNOSIS — R062 Wheezing: Secondary | ICD-10-CM | POA: Diagnosis not present

## 2021-05-16 ENCOUNTER — Emergency Department (HOSPITAL_BASED_OUTPATIENT_CLINIC_OR_DEPARTMENT_OTHER): Payer: No Typology Code available for payment source | Admitting: Radiology

## 2021-05-16 ENCOUNTER — Emergency Department (HOSPITAL_BASED_OUTPATIENT_CLINIC_OR_DEPARTMENT_OTHER)
Admission: EM | Admit: 2021-05-16 | Discharge: 2021-05-16 | Disposition: A | Discharge status: Home / Self Care | Payer: No Typology Code available for payment source | Attending: Emergency Medicine | Admitting: Emergency Medicine

## 2021-05-16 ENCOUNTER — Encounter (HOSPITAL_BASED_OUTPATIENT_CLINIC_OR_DEPARTMENT_OTHER): Payer: Self-pay

## 2021-05-16 ENCOUNTER — Other Ambulatory Visit: Payer: Self-pay

## 2021-05-16 DIAGNOSIS — J45909 Unspecified asthma, uncomplicated: Secondary | ICD-10-CM | POA: Insufficient documentation

## 2021-05-16 DIAGNOSIS — R059 Cough, unspecified: Secondary | ICD-10-CM | POA: Diagnosis not present

## 2021-05-16 DIAGNOSIS — E039 Hypothyroidism, unspecified: Secondary | ICD-10-CM | POA: Diagnosis not present

## 2021-05-16 DIAGNOSIS — R0789 Other chest pain: Secondary | ICD-10-CM | POA: Diagnosis not present

## 2021-05-16 DIAGNOSIS — R072 Precordial pain: Secondary | ICD-10-CM | POA: Diagnosis not present

## 2021-05-16 DIAGNOSIS — R079 Chest pain, unspecified: Secondary | ICD-10-CM | POA: Diagnosis not present

## 2021-05-16 LAB — CBC WITH DIFFERENTIAL/PLATELET
Abs Immature Granulocytes: 0.01 10*3/uL (ref 0.00–0.07)
Basophils Absolute: 0.1 10*3/uL (ref 0.0–0.1)
Basophils Relative: 1 %
Eosinophils Absolute: 0.3 10*3/uL (ref 0.0–0.5)
Eosinophils Relative: 3 %
HCT: 42.8 % (ref 36.0–46.0)
Hemoglobin: 14.8 g/dL (ref 12.0–15.0)
Immature Granulocytes: 0 %
Lymphocytes Relative: 32 %
Lymphs Abs: 2.4 10*3/uL (ref 0.7–4.0)
MCH: 31.7 pg (ref 26.0–34.0)
MCHC: 34.6 g/dL (ref 30.0–36.0)
MCV: 91.6 fL (ref 80.0–100.0)
Monocytes Absolute: 0.4 10*3/uL (ref 0.1–1.0)
Monocytes Relative: 6 %
Neutro Abs: 4.4 10*3/uL (ref 1.7–7.7)
Neutrophils Relative %: 58 %
Platelets: 210 10*3/uL (ref 150–400)
RBC: 4.67 MIL/uL (ref 3.87–5.11)
RDW: 13.4 % (ref 11.5–15.5)
WBC: 7.6 10*3/uL (ref 4.0–10.5)
nRBC: 0 % (ref 0.0–0.2)

## 2021-05-16 LAB — LIPASE, BLOOD: Lipase: 20 U/L (ref 11–51)

## 2021-05-16 LAB — COMPREHENSIVE METABOLIC PANEL
ALT: 21 U/L (ref 0–44)
AST: 20 U/L (ref 15–41)
Albumin: 3.8 g/dL (ref 3.5–5.0)
Alkaline Phosphatase: 82 U/L (ref 38–126)
Anion gap: 9 (ref 5–15)
BUN: 19 mg/dL (ref 6–20)
CO2: 27 mmol/L (ref 22–32)
Calcium: 9.7 mg/dL (ref 8.9–10.3)
Chloride: 103 mmol/L (ref 98–111)
Creatinine, Ser: 0.72 mg/dL (ref 0.44–1.00)
GFR, Estimated: >60 mL/min (ref >60)
Glucose, Bld: 93 mg/dL (ref 70–99)
Potassium: 3.7 mmol/L (ref 3.5–5.1)
Sodium: 139 mmol/L (ref 135–145)
Total Bilirubin: 0.5 mg/dL (ref 0.3–1.2)
Total Protein: 6.4 g/dL — ABNORMAL LOW (ref 6.5–8.1)

## 2021-05-16 LAB — TROPONIN I (HIGH SENSITIVITY): Troponin I (High Sensitivity): 3 ng/L (ref <18)

## 2021-05-16 MED ORDER — AZITHROMYCIN 250 MG PO TABS: 250.0000 mg | ORAL_TABLET | Freq: Every day | ORAL | 0 refills | Status: DC

## 2021-05-16 MED ORDER — FAMOTIDINE 40 MG PO TABS: 40.0000 mg | ORAL_TABLET | Freq: Every day | ORAL | 0 refills | Status: DC

## 2021-05-16 NOTE — ED Provider Notes (Signed)
Emergency Department Provider Note   I have reviewed the triage vital signs and the nursing notes.   HISTORY  Chief Complaint Cough and Chest Pain   HPI Alison [name] is a 60 y.o. female presents to the ED with cough and burning CP. She has a history of GERD and associated cough at times. Today, she was sleeping and felt some severe reflux and coughing. She has had burning CP since and is concerned with some stomach acid may have come up and gotten into her lungs. Denies SOB. No fever. No abdominal pain. No radiation of symptoms or modifying factors.   Past Medical History:  Diagnosis Date   Anemia    prior to hysterectomy    Anxiety    Arthritis    LEFT KNEE   Asthma    Cervical myelopathy ([place]) 08/28/2018   Family history of adverse reaction to anesthesia    Sister PONV   GERD (gastroesophageal reflux disease)    Heart murmur    Hx-TIA (transient ischemic attack) 1992   Hyperlipidemia    Hypertension    Hypothyroidism    Obesity    Pneumonia    Polycystic ovarian syndrome     Patient Active Problem List   Diagnosis Date Noted   Gastroesophageal reflux disease 10/11/2020   S/P gastric sleeve procedure 10/11/2020   Postablative hypothyroidism 35/32/9924   Systolic murmur 26/83/4196   GAD (generalized anxiety disorder) 06/24/2018   Mild intermittent asthma without complication 22/29/7989   Mixed hyperlipidemia 06/24/2018   Essential hypertension with goal blood pressure less than 140/90 05/31/2018   Primary osteoarthritis of both knees 09/11/2016   Gastric banding status 10/07/2015   Restless legs syndrome (RLS) 02/14/2015   Family history of premature CAD 12/29/2013   Insomnia 12/29/2013   History of TIA (transient ischemic attack) 12/10/1980    Past Surgical History:  Procedure Laterality Date   ABDOMINAL HYSTERECTOMY  2009   Partial   ANTERIOR CERVICAL DECOMPRESSION/DISCECTOMY FUSION 4 LEVELS N/A 08/28/2018   Procedure: ANTERIOR CERVICAL DECOMPRESSION  FUSION, CERVICAL 4-5, CERVICAL 5-6, CERVICAL 6-7 WITH INSTRUMENTATION AND ALLOGRAFT;  Surgeon: Phylliss Bob, MD;  Location: [facility];  Service: Orthopedics;  Laterality: N/A;   BREAST LUMPECTOMY Left 06/27/2020   Procedure: LEFT BREAST LUMPECTOMY;  Surgeon: Coralie Keens, MD;  Location: [place];  Service: General;  Laterality: Left;  LMA   DIAGNOSTIC LAPAROSCOPY     FEMUR FRACTURE SURGERY  2008   Left   FRACTURE SURGERY     I & D EXTREMITY Left 06/01/2018   Procedure: IRRIGATION AND DEBRIDEMENT LEFT CALF;  Surgeon: Mcarthur Rossetti, MD;  Location: [place];  Service: Orthopedics;  Laterality: Left;   KNEE CARTILAGE SURGERY  2005   LAPAROSCOPIC GASTRIC BAND REMOVAL WITH LAPAROSCOPIC GASTRIC SLEEVE RESECTION N/A 05/03/2020   Procedure: LAPAROSCOPIC GASTRIC BAND REMOVAL WITH LAPAROSCOPIC GASTRIC SLEEVE RESECTION; UPPER ENDO AND ERAS PATHWAY;  Surgeon: Kinsinger, Arta Bruce, MD;  Location: [facility];  Service: General;  Laterality: N/A;   LAPAROSCOPIC GASTRIC BANDING  11/12/2011   Procedure: LAPAROSCOPIC GASTRIC BANDING;  Surgeon: Judieth Keens, DO;  Location: [facility];  Service: General;  Laterality: N/A;  nathanson liver retractor/ endostitch available 2-0 surgidac refills/ layton needle drivers available/ [facility] Doctor to [facility]   TONSILLECTOMY  2007    Allergies Aspirin, Cherry, Other, Soy allergy, and Penicillins  Family History  Problem Relation Age of Onset   Glaucoma Mother    Hyperthyroidism Mother    Heart disease Father  Stroke Father    Anesthesia problems Sister    Migraines Sister    Cancer Maternal Aunt        Breast Cancer   Breast cancer Maternal Aunt 37   Ovarian cancer Maternal Grandmother    Uterine cancer Maternal Grandmother    Myasthenia gravis Paternal Grandfather     Social History Social History   Tobacco Use   Smoking status: Never   Smokeless tobacco: Never  Vaping Use   Vaping Use: Never used  Substance Use Topics   Alcohol use: Not  Currently    Comment: rare   Drug use: No    Review of Systems  Constitutional: No fever/chills Eyes: No visual changes. ENT: No sore throat. Cardiovascular: Positive chest pain. Respiratory: Denies shortness of breath. Positive cough.  Gastrointestinal: No abdominal pain.  No nausea, no vomiting.  No diarrhea.  No constipation. Genitourinary: Negative for dysuria. Musculoskeletal: Negative for back pain. Skin: Negative for rash. Neurological: Negative for headaches, focal weakness or numbness.  10-point ROS otherwise negative.  ____________________________________________   PHYSICAL EXAM:  VITAL SIGNS: ED Triage Vitals  Enc Vitals Group     BP 05/16/21 1638 121/84     Pulse Rate 05/16/21 1638 60     Resp 05/16/21 1638 18     Temp 05/16/21 1638 98.6 F (37 C)     Temp Source 05/16/21 1638 Oral     SpO2 05/16/21 1638 100 %     Weight 05/16/21 1640 181 lb (82.1 kg)     Height 05/16/21 1640 5\' 3"  (1.6 m)   Constitutional: Alert and oriented. Well appearing and in no acute distress. Eyes: Conjunctivae are normal.  Nose: No congestion/rhinnorhea. Mouth/Throat: Mucous membranes are moist.   Neck: No stridor.   Cardiovascular: Normal rate, regular rhythm. Good peripheral circulation. Grossly normal heart sounds.   Respiratory: Normal respiratory effort.  No retractions. Lungs CTAB. Gastrointestinal: Soft and nontender. No distention.  Musculoskeletal: No lower extremity tenderness nor edema. No gross deformities of extremities. Neurologic:  Normal speech and language. No gross focal neurologic deficits are appreciated.   ____________________________________________   LABS (all labs ordered are listed, but only abnormal results are displayed)  Labs Reviewed  COMPREHENSIVE METABOLIC PANEL - Abnormal; Notable for the following components:      Result Value   Total Protein 6.4 (*)    All other components within normal limits  LIPASE, BLOOD  CBC WITH  DIFFERENTIAL/PLATELET  TROPONIN I (HIGH SENSITIVITY)   ____________________________________________  EKG   EKG Interpretation  Date/Time:  Tuesday May 16 2021 17:03:55 EDT Ventricular Rate:  58 PR Interval:  226 QRS Duration: 76 QT Interval:  388 QTC Calculation: 380 R Axis:   -8 Text Interpretation: Sinus bradycardia with 1st degree A-V block Nonspecific T wave abnormality Abnormal ECG Similar to prior Confirmed by Nanda Quinton [phone]) on 05/16/2021 7:30:16 PM         ____________________________________________  RADIOLOGY   CXR reviewed.  ____________________________________________   PROCEDURES  Procedure(s) performed:   Procedures  None ____________________________________________   INITIAL IMPRESSION / ASSESSMENT AND PLAN / ED COURSE  Pertinent labs & imaging results that were available during my care of the patient were reviewed by me and considered in my medical decision making (see chart for details).   Patient presents to the ED with atypical CP and cough. Question reflux flare. Clinically not consistent with large aspiration. No infiltrate on CXR. Normal vitals. Very atypical for ACS/PE. WIll cover with Z-pack for possible bronchitis.  No COVID symptoms. Plan for close PCP and GI follow up.    ____________________________________________  FINAL CLINICAL IMPRESSION(S) / ED DIAGNOSES  Final diagnoses:  Precordial chest pain  Cough    NEW OUTPATIENT MEDICATIONS STARTED DURING THIS VISIT:  Discharge Medication List as of 05/16/2021  7:31 PM     START taking these medications   Details  azithromycin (ZITHROMAX) 250 MG tablet Take 1 tablet (250 mg total) by mouth daily. Take first 2 tablets together, then 1 every day until finished., Starting Tue 05/16/2021, Normal    famotidine (PEPCID) 40 MG tablet Take 1 tablet (40 mg total) by mouth daily., Starting Tue 05/16/2021, Normal        Note:  This document was prepared using Dragon voice recognition  software and may include unintentional dictation errors.  Nanda Quinton, MD, [facility]    Saadiq Poche, [facility], MD 05/18/21 [phone]

## 2021-05-16 NOTE — Discharge Instructions (Signed)
You were seen in the emergency room today with discomfort in your chest along with cough.  I am starting you on a antibiotic along with adding Pepcid to your Protonix.  Please reach out to your GI doctors regarding a follow-up appointment and consideration of upper endoscopy.  If your chest pain worsens, you develop shortness of breath, fevers, other sudden/severe symptoms he should return to the emergency department for reevaluation.

## 2021-05-16 NOTE — ED Triage Notes (Addendum)
Cough and "slight burning" in left chest.  Patient had 1 episode of vomiting at 0400 this morning after "an attack of acid reflux" and has had a productive cough with yellow phlegm since, patient unsure if she "got any acid in my lungs" and wanted to get it checked out.

## 2021-05-30 ENCOUNTER — Encounter: Payer: No Typology Code available for payment source | Attending: General Surgery | Admitting: Skilled Nursing Facility1

## 2021-05-30 ENCOUNTER — Other Ambulatory Visit: Payer: Self-pay

## 2021-05-30 DIAGNOSIS — E669 Obesity, unspecified: Secondary | ICD-10-CM | POA: Insufficient documentation

## 2021-05-30 NOTE — Progress Notes (Signed)
Bariatric Class:  Appt start time: 6:00 end time: 7:00  12 Month Post-Operative Nutrition Class  Patient was seen on 05/30/2021 for Post-Operative Nutrition education [facility].   Anthropometrics  Surgery date: 05/03/2020 Surgery type: lap band to sleeve Start weight at [facility]: 234.5 Weight today: 183.3 pounds   Body Composition Scale 05/17/2020 06/28/2020 05/30/2021  Total Body Fat % 44.9 43 40.4  Visceral Fat 17 15 13  Fat-Free Mass % 55 56.9 59.5   Total Body Water % 42 42.9 44.2   Muscle-Mass lbs 28.1 28.3 27.7  Body Fat Displacement            Torso  lbs 60.9 54.5 45.7         Left Leg  lbs 12.1 10.9 9.1         Right Leg  lbs 12.1 10.9 9.1         Left Arm  lbs 6 5.4 4.5         Right Arm   lbs 6 5.4 4.5   Pt states she recognizes she is snacking more stating she picks when coming home instead of eating a meal for dinner. Pt states her grocery shopping has not been consistent. Pt states she has been having a really bog problem with reflux so now with 2 acid reducers it has helped but still with breakthrough.   Pt states she has normal bowel movements daily. Pt states her hair has been thin thinking maybe it is her thyroid.  Pt state she is not great at taking her multivitamin daily. Pt states her nails are brittle.   24hr recall:  Breakfast: protein shake Snack: Lunch: lettuce + tuna + ritz crackers Snack: watermelon Dinner: chicken patty + lettuce or picking on snack food  Beverages: diet soda, water + flavoring  Goals: Get back to grocery shopping Try alkaline water pH 8.8 Call your surgeon for another appt about your reflux   The following the learning objectives were met by the patient during this course: Review of TANITA scale information Share and discuss bariatric surgery successes and non-scale victories Identifies Phase VII (Maintenance Phase) Dietary Goals which will be lifelong Identifies appropriate sources of  fluids, proteins, non-starchy vegetables, and complex carbohydrates Identifies well-balanced meals Identifies portion control  Identifies appropriate multivitamin and calcium sources post-operatively Describes the need for physical activity post-operatively and will follow MD recommendations Identifies and describes SMART goals  Creates at least 2 SMART goals to begin immediately States when to call healthcare provider regarding medication questions or post-operative complications  Teaching method utilized: Visual & Auditory  Demonstrated degree of understanding via: Teach Back  Readiness Level: Action Barriers to learning/adherence to lifestyle change: None Identified  Handouts given during class include: Phase VII: Maintenance Phase-Lifelong  Follow-Up Plan: Patient will follow-up at [facility] for on-going post-op nutrition visits.   

## 2021-07-22 ENCOUNTER — Other Ambulatory Visit: Payer: Self-pay | Admitting: Family Medicine

## 2021-10-19 ENCOUNTER — Other Ambulatory Visit: Payer: Self-pay | Admitting: Family Medicine

## 2021-11-11 ENCOUNTER — Other Ambulatory Visit: Payer: Self-pay | Admitting: Family Medicine

## 2021-11-24 ENCOUNTER — Encounter (HOSPITAL_COMMUNITY): Payer: Self-pay | Admitting: *Deleted

## 2022-02-25 ENCOUNTER — Other Ambulatory Visit: Payer: Self-pay | Admitting: Family Medicine

## 2022-03-02 ENCOUNTER — Telehealth: Payer: Self-pay

## 2022-03-02 NOTE — Telephone Encounter (Signed)
MEDICATION: escitalopram (LEXAPRO) 10 MG tablet ? ?PHARMACY:  ?[id number] [place], [place] [address]  [phone] ?Fax:  [phone] ?  ? ? ?Comments: Patient is scheduled for April 5th. Patient would like to see if a short supply could be sent in.  ? ? ? ? ? ? ? ? ?**Let patient know to contact pharmacy at the end of the day to make sure medication is ready. ** ? ?** Please notify patient to allow 48-72 hours to process** ? ?**Encourage patient to contact the pharmacy for refills or they can request refills through [facility]* ? ?

## 2022-03-05 ENCOUNTER — Other Ambulatory Visit: Payer: Self-pay

## 2022-03-05 DIAGNOSIS — F411 Generalized anxiety disorder: Secondary | ICD-10-CM

## 2022-03-05 MED ORDER — ESCITALOPRAM OXALATE 10 MG PO TABS: 10.0000 mg | ORAL_TABLET | Freq: Every day | ORAL | 0 refills | Status: DC

## 2022-03-05 NOTE — Telephone Encounter (Signed)
30 day supply sent to pharmacy.  

## 2022-03-07 ENCOUNTER — Ambulatory Visit: Payer: No Typology Code available for payment source | Admitting: Family Medicine

## 2022-03-14 ENCOUNTER — Ambulatory Visit (INDEPENDENT_AMBULATORY_CARE_PROVIDER_SITE_OTHER): Payer: No Typology Code available for payment source | Admitting: Family Medicine

## 2022-03-14 ENCOUNTER — Encounter: Payer: Self-pay | Admitting: Family Medicine

## 2022-03-14 VITALS — BP 105/74 | HR 63 | Temp 98.0°F | Ht <= 58 in | Wt 206.0 lb

## 2022-03-14 DIAGNOSIS — Z9884 Bariatric surgery status: Secondary | ICD-10-CM

## 2022-03-14 DIAGNOSIS — I10 Essential (primary) hypertension: Secondary | ICD-10-CM

## 2022-03-14 DIAGNOSIS — Z1231 Encounter for screening mammogram for malignant neoplasm of breast: Secondary | ICD-10-CM | POA: Diagnosis not present

## 2022-03-14 DIAGNOSIS — R011 Cardiac murmur, unspecified: Secondary | ICD-10-CM

## 2022-03-14 DIAGNOSIS — R69 Illness, unspecified: Secondary | ICD-10-CM | POA: Diagnosis not present

## 2022-03-14 DIAGNOSIS — E782 Mixed hyperlipidemia: Secondary | ICD-10-CM | POA: Diagnosis not present

## 2022-03-14 DIAGNOSIS — E890 Postprocedural hypothyroidism: Secondary | ICD-10-CM

## 2022-03-14 DIAGNOSIS — F411 Generalized anxiety disorder: Secondary | ICD-10-CM | POA: Diagnosis not present

## 2022-03-14 LAB — CBC WITH DIFFERENTIAL/PLATELET
Basophils Absolute: 0.1 10*3/uL (ref 0.0–0.1)
Basophils Relative: 2.4 % (ref 0.0–3.0)
Eosinophils Absolute: 0.2 10*3/uL (ref 0.0–0.7)
Eosinophils Relative: 4.1 % (ref 0.0–5.0)
HCT: 40 % (ref 36.0–46.0)
Hemoglobin: 13.9 g/dL (ref 12.0–15.0)
Lymphocytes Relative: 34.9 % (ref 12.0–46.0)
Lymphs Abs: 2.1 10*3/uL (ref 0.7–4.0)
MCHC: 34.7 g/dL (ref 30.0–36.0)
MCV: 92.4 fl (ref 78.0–100.0)
Monocytes Absolute: 0.4 10*3/uL (ref 0.1–1.0)
Monocytes Relative: 5.9 % (ref 3.0–12.0)
Neutro Abs: 3.2 10*3/uL (ref 1.4–7.7)
Neutrophils Relative %: 52.7 % (ref 43.0–77.0)
Platelets: 192 10*3/uL (ref 150.0–400.0)
RBC: 4.33 Mil/uL (ref 3.87–5.11)
RDW: 13.6 % (ref 11.5–15.5)
WBC: 6.1 10*3/uL (ref 4.0–10.5)

## 2022-03-14 LAB — COMPREHENSIVE METABOLIC PANEL
ALT: 22 U/L (ref 0–35)
AST: 21 U/L (ref 0–37)
Albumin: 3.9 g/dL (ref 3.5–5.2)
Alkaline Phosphatase: 67 U/L (ref 39–117)
BUN: 23 mg/dL (ref 6–23)
CO2: 27 mEq/L (ref 19–32)
Calcium: 9.9 mg/dL (ref 8.4–10.5)
Chloride: 105 mEq/L (ref 96–112)
Creatinine, Ser: 0.8 mg/dL (ref 0.40–1.20)
GFR: 80.32 mL/min (ref >60.00)
Glucose, Bld: 76 mg/dL (ref 70–99)
Potassium: 3.7 mEq/L (ref 3.5–5.1)
Sodium: 139 mEq/L (ref 135–145)
Total Bilirubin: 0.5 mg/dL (ref 0.2–1.2)
Total Protein: 6 g/dL (ref 6.0–8.3)

## 2022-03-14 LAB — LIPID PANEL
Cholesterol: 184 mg/dL (ref 0–200)
HDL: 64 mg/dL (ref >39.00)
LDL Cholesterol: 107 mg/dL — ABNORMAL HIGH (ref 0–99)
NonHDL: 119.8
Total CHOL/HDL Ratio: 3
Triglycerides: 65 mg/dL (ref 0.0–149.0)
VLDL: 13 mg/dL (ref 0.0–40.0)

## 2022-03-14 LAB — TSH: TSH: 8.45 u[IU]/mL — ABNORMAL HIGH (ref 0.35–5.50)

## 2022-03-14 LAB — VITAMIN D 25 HYDROXY (VIT D DEFICIENCY, FRACTURES): VITD: 27.16 ng/mL — ABNORMAL LOW (ref 30.00–100.00)

## 2022-03-14 MED ORDER — ESCITALOPRAM OXALATE 10 MG PO TABS: 10.0000 mg | ORAL_TABLET | Freq: Every day | ORAL | 3 refills | Status: DC

## 2022-03-14 NOTE — Progress Notes (Signed)
? ? ?Subjective  ?CC:  ?Chief Complaint  ?Patient presents with  ? Anxiety  ?  Lexapro  ? ? ?HPI: Alison [name] is a 61 y.o. female who presents to the office today to address the problems listed above in the chief complaint, mood problems. ?GAD. Doing very well on lexapro 10 daily. Has been on for several years. Chronic condition. Increased stress at home with her mom who has FTD but copes well. No Aes. Needs refill ?HLD: last visit 10/2020. Due for recheck. Reports she remains on crestor 40 nightly. Diet has been worse. Weight is up.  ?HTN on amlodipine. Feeling well. Taking medications w/o adverse effects. No symptoms of CHF, angina; no palpitations, sob, cp or lower extremity edema. Compliant with meds.  ?H/o gastric sleeve and vitamin/iron deficiency. Needs recheck.  ?HM: overdue for mammo and cpe ? ? ?  03/14/2022  ?  9:37 AM 10/11/2020  ?  9:34 AM 03/01/2020  ?  3:36 PM  ?Depression screen PHQ 2/9  ?Decreased Interest 0 0 0  ?Down, Depressed, Hopeless 0 0 0  ?PHQ - 2 Score 0 0 0  ? ? ?  09/29/2019  ? 10:11 AM  ?GAD 7 : Generalized Anxiety Score  ?Nervous, Anxious, on Edge 0  ?Control/stop worrying 0  ?Worry too much - different things 0  ?Trouble relaxing 0  ?Restless 0  ?Easily annoyed or irritable 0  ?Afraid - awful might happen 0  ?Total GAD 7 Score 0  ?Anxiety Difficulty Not difficult at all  ? ? ? ?Assessment  ?1. GAD (generalized anxiety disorder)   ?2. Essential hypertension   ?3. Mixed hyperlipidemia   ?4. Postablative hypothyroidism   ?5. Systolic murmur   ?6. Gastric banding status   ?7. Encounter for screening mammogram for breast cancer   ? ?  ?Plan  ?GAD: very well controlled. Will continue lexapro 10daily long term.  ?HTN is controlled. Continue amlodipine 10 ?HLD: recheck fasting levels today with lfts on crestor 40 nightly ?Recheck thyroid levels; no longer seeing endocrine. On 32mg daily. Clinically euthyroid ?Asymptomatic murmur ?Gastric banding status: since removed. Check D and iron levels.   ?Pt to schedule mammogram.  ? ?Follow up: 3-6 mo for cpe  ?Orders Placed This Encounter  ?Procedures  ? MM DIGITAL SCREENING BILATERAL  ? TSH  ? Lipid panel  ? CBC with Differential/Platelet  ? Comprehensive metabolic panel  ? Iron, TIBC and Ferritin Panel  ? VITAMIN D 25 Hydroxy (Vit-D Deficiency, Fractures)  ? ?Meds ordered this encounter  ?Medications  ? escitalopram (LEXAPRO) 10 MG tablet  ?  Sig: Take 1 tablet (10 mg total) by mouth daily.  ?  Dispense:  90 tablet  ?  Refill:  3  ? ?  ? ?I reviewed the patients updated PMH, FH, and SocHx.  ?  ?Patient Active Problem List  ? Diagnosis Date Noted  ? Postablative hypothyroidism 02/03/2020  ?  Priority: High  ? Systolic murmur 168/11/7516 ?  Priority: High  ? GAD (generalized anxiety disorder) 06/24/2018  ?  Priority: High  ? Mixed hyperlipidemia 06/24/2018  ?  Priority: High  ? Essential hypertension 05/31/2018  ?  Priority: High  ? Family history of premature CAD 12/29/2013  ?  Priority: High  ? History of TIA (transient ischemic attack) 12/10/1980  ?  Priority: High  ? Gastroesophageal reflux disease 10/11/2020  ?  Priority: Medium   ? S/P gastric sleeve procedure 10/11/2020  ?  Priority: Medium   ?  Mild intermittent asthma without complication 68/11/7516  ?  Priority: Medium   ? Primary osteoarthritis of both knees 09/11/2016  ?  Priority: Medium   ? Gastric banding status 10/07/2015  ?  Priority: Medium   ? Restless legs syndrome (RLS) 02/14/2015  ?  Priority: Medium   ? Insomnia 12/29/2013  ?  Priority: Medium   ? ?Current Meds  ?Medication Sig  ? albuterol (VENTOLIN HFA) 108 (90 Base) MCG/ACT inhaler Inhale 2 puffs into the lungs every 6 (six) hours as needed. For asthma (Patient taking differently: Inhale 2 puffs into the lungs every 6 (six) hours as needed for shortness of breath.)  ? ALPRAZolam (XANAX) 0.25 MG tablet Take 1 tablet (0.25 mg total) by mouth at bedtime as needed for anxiety or sleep.  ? amLODipine (NORVASC) 5 MG tablet Take 1 tablet (5 mg  total) by mouth daily.  ? Cobalamin Combinations (OPURITY B12/FOLIC ACID) 0017-494 MCG TABS Take 1 tablet by mouth daily.  ? famotidine (PEPCID) 40 MG tablet Take 1 tablet (40 mg total) by mouth daily.  ? levothyroxine (SYNTHROID) 88 MCG tablet Take 1 tablet (88 mcg total) by mouth daily.  ? pantoprazole (PROTONIX) 40 MG tablet TAKE 1 TABLET BY MOUTH EVERY DAY  ? rOPINIRole (REQUIP) 3 MG tablet Take 1 tablet (3 mg total) by mouth at bedtime.  ? rosuvastatin (CRESTOR) 40 MG tablet Take 1 tablet (40 mg total) by mouth at bedtime.  ? traZODone (DESYREL) 50 MG tablet Take 1-2 tablets (50-100 mg total) by mouth at bedtime as needed for sleep.  ? [DISCONTINUED] escitalopram (LEXAPRO) 10 MG tablet Take 1 tablet (10 mg total) by mouth daily.  ? ? ?Allergies: ?Patient is allergic to aspirin, cherry, other, soy allergy, and penicillins. ?Family history:  ?Patient family history includes Anesthesia problems in her sister; Breast cancer (age of onset: 72) in her maternal aunt; Cancer in her maternal aunt; Glaucoma in her mother; Heart disease in her father; Hyperthyroidism in her mother; Migraines in her sister; Myasthenia gravis in her paternal grandfather; Ovarian cancer in her maternal grandmother; Stroke in her father; Uterine cancer in her maternal grandmother. ?Social History  ? ?Socioeconomic History  ? Marital status: Single  ?  Spouse name: Not on file  ? Number of children: Not on file  ? Years of education: Not on file  ? Highest education level: Not on file  ?Occupational History  ? Not on file  ?Tobacco Use  ? Smoking status: Never  ? Smokeless tobacco: Never  ?Vaping Use  ? Vaping Use: Never used  ?Substance and Sexual Activity  ? Alcohol use: Not Currently  ?  Comment: rare  ? Drug use: No  ? Sexual activity: Not on file  ?Other Topics Concern  ? Not on file  ?Social History Narrative  ? Not on file  ? ?Social Determinants of Health  ? ?Financial Resource Strain: Not on file  ?Food Insecurity: Not on file   ?Transportation Needs: Not on file  ?Physical Activity: Not on file  ?Stress: Not on file  ?Social Connections: Not on file  ? ? ? ?Review of Systems: ?Constitutional: Negative for fever malaise or anorexia ?Cardiovascular: negative for chest pain ?Respiratory: negative for SOB or persistent cough ?Gastrointestinal: negative for abdominal pain ? ?Objective  ?Vitals: BP 105/74   Pulse 63   Temp 98 ?F (36.7 ?C) (Temporal)   Ht 1' (0.305 m)   Wt 206 lb (93.4 kg)   SpO2 100%   BMI 1005.79 kg/m?  ?  General: no acute distress, well appearing, no apparent distress, well groomed ?Psych:  Alert and oriented x 3,normal mood, behavior, speech, dress, and thought processes.  ?Cardiovascular:  RRR with 2/6 systolic murmur no gallop. no peripheral edema ?Respiratory:  Good breath sounds bilaterally, CTAB with normal respiratory effort ?Skin:  Warm, no rashes ?Neuro: no tremor ? ? ?Commons side effects, risks, benefits, and alternatives for medications and treatment plan prescribed today were discussed, and the patient expressed understanding of the given instructions. Patient is instructed to call or message via MyChart if he/she has any questions or concerns regarding our treatment plan. No barriers to understanding were identified. We discussed Red Flag symptoms and signs in detail. Patient expressed understanding regarding what to do in case of urgent or emergency type symptoms.  ?Medication list was reconciled, printed and provided to the patient in AVS. Patient instructions and summary information was reviewed with the patient as documented in the AVS. ?This note was prepared with assistance of Systems analyst. Occasional wrong-word or sound-a-like substitutions may have occurred due to the inherent limitations of voice recognition software ? ? ? ? ?

## 2022-03-14 NOTE — Patient Instructions (Signed)
Please return in 3-6 months for your annual complete physical; please come fasting.  ? ?I will release your lab results to you on your MyChart account with further instructions. You may see the results before I do, but when I review them I will send you a message with my report or have my assistant call you if things need to be discussed. Please reply to my message with any questions. Thank you!  ? ?I have refilled your lexapro.  ?Let me know when you need other refills.  ? ?I have ordered a mammogram and/or bone density for you as we discussed today: ?'[x]'$   Mammogram  ?'[]'$   Bone Density ? ?Please call the office checked below to schedule your appointment: ? ?'[x]'$   [facility]     ? [address] [place], [place]       ? [phone]        ? ?'[]'$   [facility] ? [place]  ? [place], [place] ? [phone] ? ? ? ?If you have any questions or concerns, please don't hesitate to send me a message via MyChart or call the office at [phone]. Thank you for visiting with [place] today! It's our pleasure caring for you.  ?

## 2022-03-15 LAB — IRON,TIBC AND FERRITIN PANEL
%SAT: 41 % (calc) (ref 16–45)
Ferritin: 54 ng/mL (ref 16–232)
Iron: 134 ug/dL (ref 45–160)
TIBC: 326 mcg/dL (calc) (ref 250–450)

## 2022-03-19 ENCOUNTER — Other Ambulatory Visit: Payer: Self-pay

## 2022-03-19 ENCOUNTER — Telehealth: Payer: Self-pay

## 2022-03-19 MED ORDER — LEVOTHYROXINE SODIUM 100 MCG PO TABS: 100.0000 ug | ORAL_TABLET | Freq: Every day | ORAL | 0 refills | Status: DC

## 2022-03-19 NOTE — Telephone Encounter (Signed)
Patient states was speaking with Melitta and got disconected.  Is requesting call back.  States can leave VM if she does not answer.

## 2022-03-20 NOTE — Telephone Encounter (Signed)
Pt just needs a lab appointment for 6-8 weeks.  ? ?Thanks!  ?

## 2022-04-30 ENCOUNTER — Other Ambulatory Visit (INDEPENDENT_AMBULATORY_CARE_PROVIDER_SITE_OTHER): Payer: No Typology Code available for payment source

## 2022-04-30 ENCOUNTER — Other Ambulatory Visit: Payer: Self-pay

## 2022-04-30 DIAGNOSIS — E890 Postprocedural hypothyroidism: Secondary | ICD-10-CM | POA: Diagnosis not present

## 2022-05-01 LAB — TSH: TSH: 3.79 u[IU]/mL (ref 0.35–5.50)

## 2022-05-02 ENCOUNTER — Other Ambulatory Visit: Payer: Self-pay | Admitting: Family Medicine

## 2022-05-02 DIAGNOSIS — J208 Acute bronchitis due to other specified organisms: Secondary | ICD-10-CM | POA: Diagnosis not present

## 2022-05-02 MED ORDER — PANTOPRAZOLE SODIUM 40 MG PO TBEC: 40.0000 mg | DELAYED_RELEASE_TABLET | Freq: Every day | ORAL | 0 refills | Status: DC

## 2022-06-15 ENCOUNTER — Other Ambulatory Visit: Payer: Self-pay | Admitting: Family Medicine

## 2022-06-22 ENCOUNTER — Ambulatory Visit (INDEPENDENT_AMBULATORY_CARE_PROVIDER_SITE_OTHER): Payer: 59 | Admitting: Family Medicine

## 2022-06-22 VITALS — BP 120/70 | HR 81 | Temp 98.2°F | Ht 62.0 in | Wt 204.6 lb

## 2022-06-22 DIAGNOSIS — E669 Obesity, unspecified: Secondary | ICD-10-CM

## 2022-06-22 DIAGNOSIS — E782 Mixed hyperlipidemia: Secondary | ICD-10-CM | POA: Diagnosis not present

## 2022-06-22 DIAGNOSIS — Z903 Acquired absence of stomach [part of]: Secondary | ICD-10-CM | POA: Diagnosis not present

## 2022-06-22 DIAGNOSIS — E890 Postprocedural hypothyroidism: Secondary | ICD-10-CM

## 2022-06-22 DIAGNOSIS — I10 Essential (primary) hypertension: Secondary | ICD-10-CM

## 2022-06-22 DIAGNOSIS — G2581 Restless legs syndrome: Secondary | ICD-10-CM | POA: Diagnosis not present

## 2022-06-22 DIAGNOSIS — Z23 Encounter for immunization: Secondary | ICD-10-CM | POA: Diagnosis not present

## 2022-06-22 DIAGNOSIS — K219 Gastro-esophageal reflux disease without esophagitis: Secondary | ICD-10-CM

## 2022-06-22 DIAGNOSIS — Z0000 Encounter for general adult medical examination without abnormal findings: Secondary | ICD-10-CM

## 2022-06-22 MED ORDER — ALBUTEROL SULFATE HFA 108 (90 BASE) MCG/ACT IN AERS: 2.0000 | INHALATION_SPRAY | Freq: Four times a day (QID) | RESPIRATORY_TRACT | 2 refills | Status: AC | PRN

## 2022-06-22 MED ORDER — ROPINIROLE HCL 3 MG PO TABS: 3.0000 mg | ORAL_TABLET | Freq: Every day | ORAL | 3 refills | Status: DC

## 2022-06-22 NOTE — Progress Notes (Unsigned)
Subjective  Chief Complaint  Patient presents with   Annual Exam   Hypothyroidism   Hyperlipidemia    HPI: Alison [name] is a 61 y.o. female who presents to [facility] [place] today for a Female Wellness Visit. She also has the concerns and/or needs as listed above in the chief complaint. These will be addressed in addition to the Health Maintenance Visit.   Wellness Visit: annual visit with health maintenance review and exam without Pap  Health maintenance: Patient is overdue for mammogram.  She needs to schedule.  She says she forgot.  Other screens are current.  Overall doing well.  Trying to lose weight. Chronic disease f/u and/or acute problem visit: (deemed necessary to be done in addition to the wellness visit): History of gastric sleeve with recent weight gain.  Has failed appetite suppressants as well.  Would be understanding GLP 1 medications.  She is eating a healthy diet and exercising. GERD: Very active on Protonix once or twice daily.  His physical reflux with bending forward.  He is to see GI next month.  May warrant another endoscopy.  No hematemesis Hypothyroidism now well controlled on levothyroxine 100 mcg daily.  Recent TSH at goal.  Feels well. Restless leg syndrome is well controlled on ropinirole.  She does need a refill. Hypertension: We stopped amlodipine due to low blood pressures.  She is taking daily home readings and they are now all normal.  Average 110/70. However she says she is still on hctz; my records show we stopped this in march in part due to oab sxs.   Assessment  1. Annual physical exam   2. Essential hypertension   3. Mixed hyperlipidemia   4. Postablative hypothyroidism   5. Restless legs syndrome (RLS)   6. Obesity (BMI 30-39.9)   7. S/P gastric sleeve procedure   8. Gastroesophageal reflux disease, unspecified whether esophagitis present      Plan  Female Wellness Visit: Age appropriate Health Maintenance and  Prevention measures were discussed with patient. Included topics are cancer screening recommendations, ways to keep healthy (see AVS) including dietary and exercise recommendations, regular eye and dental care, use of seat belts, and avoidance of moderate alcohol use and tobacco use. Pt to schedule mammogram.  BMI: discussed patient's BMI and encouraged positive lifestyle modifications to help get to or maintain a target BMI. HM needs and immunizations were addressed and ordered. See below for orders. See HM and immunization section for updates. Tdap and 2nd shingrix given today. Routine labs and screening tests ordered including cmp, cbc and lipids where appropriate. Discussed recommendations regarding Vit D and calcium supplementation (see AVS)  Chronic disease management visit and/or acute problem visit: HTN: well controlled. Pt reports on hctz 25 daily but last recorded at 12.5. pt to message [place] with correct dose and date of last fill. ? If taking consistently. Stop amlodipine. Renal and lytes are normal.  LDL is at goal on statin : rosuvastatin 40.  RLS is controlled on ropinorole '3mg'$  nightly Low thyoid is now at goal on levothx 124mg daily.  Obesity: discussed diet and exercise. Consider glp-1 depending on insurance coverage.  GERD: active. To GI. On chronic PPI. ? Need EGD.   Follow up: 6 mo for htn recheck.  Orders Placed This Encounter  Procedures   Tdap vaccine greater than or equal to 7yo IM   Varicella-zoster vaccine IM (Shingrix)   Meds ordered this encounter  Medications   albuterol (VENTOLIN HFA)  108 (90 Base) MCG/ACT inhaler    Sig: Inhale 2 puffs into the lungs every 6 (six) hours as needed. For asthma    Dispense:  18 g    Refill:  2   rOPINIRole (REQUIP) 3 MG tablet    Sig: Take 1 tablet (3 mg total) by mouth at bedtime.    Dispense:  90 tablet    Refill:  3      Body mass index is 37.42 kg/m. Wt Readings from Last 3 Encounters:  06/22/22 204 lb 9.6 oz (92.8  kg)  03/14/22 206 lb (93.4 kg)  05/30/21 183 lb 4.8 oz (83.1 kg)     Patient Active Problem List   Diagnosis Date Noted   Postablative hypothyroidism 02/03/2020    Priority: High   Systolic murmur 02/72/5366    Priority: High    Consider repeat echo if sxs or if loudening    GAD (generalized anxiety disorder) 06/24/2018    Priority: High    Overview:  Did well on paxil but stopped due to weight gain; wellbutrin - failed after 6 months. stabilized on lexapro.    Mixed hyperlipidemia 06/24/2018    Priority: High   Essential hypertension 05/31/2018    Priority: High    Overview:  Nl Echo 02/2011    Family history of premature CAD 12/29/2013    Priority: High    Overview:  Father, 64 yo    History of TIA (transient ischemic attack) 12/10/1980    Priority: High    When on OCPs    Gastroesophageal reflux disease 10/11/2020    Priority: Medium    S/P gastric sleeve procedure 10/11/2020    Priority: Medium    Mild intermittent asthma without complication 44/02/4741    Priority: Medium     Overview:  Negative sleep study, 04/2011    Primary osteoarthritis of both knees 09/11/2016    Priority: Medium     Overview:  Dr. Noemi Chapel    Gastric banding status 10/07/2015    Priority: Medium     Removed 2021; converted to gastric sleeve    Restless legs syndrome (RLS) 02/14/2015    Priority: Medium    Insomnia 12/29/2013    Priority: Medium     Overview:  Failed lunesta and ambien and trazadone; Uses xanax rarely    Health Maintenance  Topic Date Due   MAMMOGRAM  04/15/2021   TETANUS/TDAP  12/10/2021   Zoster Vaccines- Shingrix (2 of 2) 03/16/2022   COVID-19 Vaccine (6 - Moderna series) 07/08/2022 (Originally 02/09/2022)   INFLUENZA VACCINE  07/10/2022   PAP SMEAR-Modifier  10/11/2025   COLONOSCOPY (Pts 45-11yr Insurance coverage will need to be confirmed)  10/24/2025   Hepatitis C Screening  Completed   HIV Screening  Completed   HPV VACCINES  Aged Out    Immunization History  Administered Date(s) Administered   Influenza, Quadrivalent, Recombinant, Inj, Pf 09/17/2019   Influenza, Seasonal, Injecte, Preservative Fre 08/20/2014, 09/12/2015   Influenza,inj,Quad PF,6+ Mos 09/11/2016, 09/25/2017, 09/24/2018, 09/18/2019, 10/11/2020, 01/19/2022   Moderna Sars-Covid-2 Vaccination 02/09/2020, 03/08/2020, 11/15/2020, 07/28/2021, 12/15/2021   Pneumococcal Polysaccharide-23 12/10/2012   Tdap 12/11/2011   Zoster Recombinat (Shingrix) 01/19/2022   We updated and reviewed the patient's past history in detail and it is documented below. Allergies: Patient is allergic to aspirin, cherry, other, soy allergy, and penicillins. Past Medical History Patient  has a past medical history of Anemia, Anxiety, Arthritis, Asthma, [place]ervical myelopathy ([place] (08/28/2018), Family history of adverse reaction to anesthesia, GERD (gastroesophageal reflux disease),  Heart murmur, TIA (transient ischemic attack) (1992), Hyperlipidemia, Hypertension, Hypothyroidism, Obesity, Pneumonia, and Polycystic ovarian syndrome. Past Surgical History Patient  has a past surgical history that includes Knee cartilage surgery (2005); Abdominal hysterectomy (2009); Tonsillectomy (2007); Femur fracture surgery (2008); Laparoscopic gastric banding (11/12/2011); I & D extremity (Left, 06/01/2018); Anterior cervical decompression/discectomy fusion 4 level (N/A, 08/28/2018); Laparoscopic gastric band removal with laparoscopic gastric sleeve resection (N/A, 05/03/2020); Fracture surgery; Diagnostic laparoscopy; and Breast lumpectomy (Left, 06/27/2020). Family History: Patient family history includes Anesthesia problems in her sister; Breast cancer (age of onset: 33) in her maternal aunt; Cancer in her maternal aunt; Glaucoma in her mother; Heart disease in her father; Hyperthyroidism in her mother; Migraines in her sister; Myasthenia gravis in her paternal grandfather; Ovarian cancer in her maternal  grandmother; Stroke in her father; Uterine cancer in her maternal grandmother. Social History:  Patient  reports that she has never smoked. She has never used smokeless tobacco. She reports that she does not currently use alcohol. She reports that she does not use drugs.  Review of Systems: Constitutional: negative for fever or malaise Ophthalmic: negative for photophobia, double vision or loss of vision Cardiovascular: negative for chest pain, dyspnea on exertion, or new LE swelling Respiratory: negative for SOB or persistent cough Gastrointestinal: negative for abdominal pain, change in bowel habits or melena Genitourinary: negative for dysuria or gross hematuria, no abnormal uterine bleeding or disharge Musculoskeletal: negative for new gait disturbance or muscular weakness Integumentary: negative for new or persistent rashes, no breast lumps Neurological: negative for TIA or stroke symptoms Psychiatric: negative for SI or delusions Allergic/Immunologic: negative for hives  Patient Care Team    Relationship Specialty Notifications Start End  Leamon Arnt, MD PCP - General Family Medicine  06/24/18     Objective  Vitals: BP 120/70   Pulse 81   Temp 98.2 F (36.8 C)   Ht '5\' 2"'$  (1.575 m)   Wt 204 lb 9.6 oz (92.8 kg)   SpO2 95%   BMI 37.42 kg/m  General:  Well developed, well nourished, no acute distress  Psych:  Alert and orientedx3,normal mood and affect HEENT:  Normocephalic, atraumatic, non-icteric sclera,  supple neck without adenopathy, mass or thyromegaly Cardiovascular:  Normal S1, S2, RRR without gallop, rub or murmur Respiratory:  Good breath sounds bilaterally, CTAB with normal respiratory effort Gastrointestinal: normal bowel sounds, soft, non-tender, no noted masses. No HSM MSK: no deformities, contusions. Joints are without erythema or swelling.  Skin:  Warm, no rashes or suspicious lesions noted Neurologic:    Mental status is normal. Gross motor and sensory  exams are normal. Normal gait. No tremor   Commons side effects, risks, benefits, and alternatives for medications and treatment plan prescribed today were discussed, and the patient expressed understanding of the given instructions. Patient is instructed to call or message via MyChart if he/she has any questions or concerns regarding our treatment plan. No barriers to understanding were identified. We discussed Red Flag symptoms and signs in detail. Patient expressed understanding regarding what to do in case of urgent or emergency type symptoms.  Medication list was reconciled, printed and provided to the patient in AVS. Patient instructions and summary information was reviewed with the patient as documented in the AVS. This note was prepared with assistance of Dragon voice recognition software. Occasional wrong-word or sound-a-like substitutions may have occurred due to the inherent limitations of voice recognition software  This visit occurred during the SARS-CoV-2 public health emergency.  Safety protocols were  in place, including screening questions prior to the visit, additional usage of staff PPE, and extensive cleaning of exam room while observing appropriate contact time as indicated for disinfecting solutions.

## 2022-06-22 NOTE — Patient Instructions (Signed)
Please return in 6 months for hypertension follow up.   Today you were given your 2nd shingrix vaccine and your Tdap.   If you have any questions or concerns, please don't hesitate to send me a message via MyChart or call the office at [phone]. Thank you for visiting with [place] today! It's our pleasure caring for you. 6 months for hypertension follow up.

## 2022-07-24 DIAGNOSIS — M170 Bilateral primary osteoarthritis of knee: Secondary | ICD-10-CM | POA: Diagnosis not present

## 2022-08-03 ENCOUNTER — Inpatient Hospital Stay: Admission: RE | Admit: 2022-08-03 | Payer: 59 | Source: Ambulatory Visit

## 2022-08-06 ENCOUNTER — Other Ambulatory Visit: Payer: Self-pay | Admitting: Family Medicine

## 2022-08-06 DIAGNOSIS — Z9889 Other specified postprocedural states: Secondary | ICD-10-CM

## 2022-08-09 DIAGNOSIS — K2100 Gastro-esophageal reflux disease with esophagitis, without bleeding: Secondary | ICD-10-CM | POA: Diagnosis not present

## 2022-08-09 DIAGNOSIS — E569 Vitamin deficiency, unspecified: Secondary | ICD-10-CM | POA: Diagnosis not present

## 2022-08-09 DIAGNOSIS — Z9884 Bariatric surgery status: Secondary | ICD-10-CM | POA: Diagnosis not present

## 2022-08-09 DIAGNOSIS — K912 Postsurgical malabsorption, not elsewhere classified: Secondary | ICD-10-CM | POA: Diagnosis not present

## 2022-08-20 ENCOUNTER — Other Ambulatory Visit: Payer: Self-pay | Admitting: General Surgery

## 2022-08-20 ENCOUNTER — Ambulatory Visit
Admission: RE | Admit: 2022-08-20 | Discharge: 2022-08-20 | Disposition: A | Discharge status: Home / Self Care | Payer: 59 | Source: Ambulatory Visit | Attending: Family Medicine | Admitting: Family Medicine

## 2022-08-20 DIAGNOSIS — E569 Vitamin deficiency, unspecified: Secondary | ICD-10-CM

## 2022-08-20 DIAGNOSIS — K912 Postsurgical malabsorption, not elsewhere classified: Secondary | ICD-10-CM

## 2022-08-20 DIAGNOSIS — Z9884 Bariatric surgery status: Secondary | ICD-10-CM

## 2022-08-20 DIAGNOSIS — K2100 Gastro-esophageal reflux disease with esophagitis, without bleeding: Secondary | ICD-10-CM

## 2022-08-20 DIAGNOSIS — Z9889 Other specified postprocedural states: Secondary | ICD-10-CM

## 2022-08-20 DIAGNOSIS — R922 Inconclusive mammogram: Secondary | ICD-10-CM | POA: Diagnosis not present

## 2022-08-21 ENCOUNTER — Ambulatory Visit
Admission: RE | Admit: 2022-08-21 | Discharge: 2022-08-21 | Disposition: A | Discharge status: Home / Self Care | Payer: 59 | Source: Ambulatory Visit | Attending: General Surgery | Admitting: General Surgery

## 2022-08-21 DIAGNOSIS — K912 Postsurgical malabsorption, not elsewhere classified: Secondary | ICD-10-CM

## 2022-08-21 DIAGNOSIS — E569 Vitamin deficiency, unspecified: Secondary | ICD-10-CM

## 2022-08-21 DIAGNOSIS — K224 Dyskinesia of esophagus: Secondary | ICD-10-CM | POA: Diagnosis not present

## 2022-08-21 DIAGNOSIS — K2100 Gastro-esophageal reflux disease with esophagitis, without bleeding: Secondary | ICD-10-CM

## 2022-08-21 DIAGNOSIS — Z9884 Bariatric surgery status: Secondary | ICD-10-CM

## 2022-08-21 DIAGNOSIS — K219 Gastro-esophageal reflux disease without esophagitis: Secondary | ICD-10-CM | POA: Diagnosis not present

## 2022-08-24 DIAGNOSIS — M1712 Unilateral primary osteoarthritis, left knee: Secondary | ICD-10-CM | POA: Diagnosis not present

## 2022-09-03 ENCOUNTER — Encounter: Payer: Self-pay | Admitting: *Deleted

## 2022-09-10 DIAGNOSIS — K2100 Gastro-esophageal reflux disease with esophagitis, without bleeding: Secondary | ICD-10-CM | POA: Diagnosis not present

## 2022-09-10 DIAGNOSIS — M1712 Unilateral primary osteoarthritis, left knee: Secondary | ICD-10-CM | POA: Diagnosis not present

## 2022-09-10 DIAGNOSIS — E569 Vitamin deficiency, unspecified: Secondary | ICD-10-CM | POA: Diagnosis not present

## 2022-09-10 DIAGNOSIS — K912 Postsurgical malabsorption, not elsewhere classified: Secondary | ICD-10-CM | POA: Diagnosis not present

## 2022-09-10 DIAGNOSIS — Z9884 Bariatric surgery status: Secondary | ICD-10-CM | POA: Diagnosis not present

## 2022-09-14 DIAGNOSIS — R1319 Other dysphagia: Secondary | ICD-10-CM | POA: Diagnosis not present

## 2022-09-14 DIAGNOSIS — K2100 Gastro-esophageal reflux disease with esophagitis, without bleeding: Secondary | ICD-10-CM | POA: Diagnosis not present

## 2022-09-14 DIAGNOSIS — Z9884 Bariatric surgery status: Secondary | ICD-10-CM | POA: Diagnosis not present

## 2022-09-17 ENCOUNTER — Encounter: Payer: Self-pay | Admitting: Family Medicine

## 2022-09-25 DIAGNOSIS — M25562 Pain in left knee: Secondary | ICD-10-CM | POA: Diagnosis not present

## 2022-10-02 DIAGNOSIS — M1712 Unilateral primary osteoarthritis, left knee: Secondary | ICD-10-CM | POA: Diagnosis not present

## 2022-10-08 ENCOUNTER — Ambulatory Visit (INDEPENDENT_AMBULATORY_CARE_PROVIDER_SITE_OTHER): Payer: 59 | Admitting: Family Medicine

## 2022-10-08 ENCOUNTER — Encounter: Payer: Self-pay | Admitting: Family Medicine

## 2022-10-08 VITALS — BP 130/82 | HR 72 | Temp 98.1°F | Ht 62.0 in | Wt 200.8 lb

## 2022-10-08 DIAGNOSIS — F5101 Primary insomnia: Secondary | ICD-10-CM

## 2022-10-08 DIAGNOSIS — F411 Generalized anxiety disorder: Secondary | ICD-10-CM

## 2022-10-08 DIAGNOSIS — E890 Postprocedural hypothyroidism: Secondary | ICD-10-CM

## 2022-10-08 DIAGNOSIS — I10 Essential (primary) hypertension: Secondary | ICD-10-CM

## 2022-10-08 DIAGNOSIS — R69 Illness, unspecified: Secondary | ICD-10-CM | POA: Diagnosis not present

## 2022-10-08 MED ORDER — ALPRAZOLAM 0.25 MG PO TABS: 0.2500 mg | ORAL_TABLET | Freq: Every evening | ORAL | 0 refills | Status: DC | PRN

## 2022-10-08 MED ORDER — PANTOPRAZOLE SODIUM 40 MG PO TBEC: 40.0000 mg | DELAYED_RELEASE_TABLET | Freq: Every day | ORAL | 3 refills | Status: DC

## 2022-10-08 NOTE — Progress Notes (Signed)
Subjective  CC:  Chief Complaint  Patient presents with   surgical Clearance    Pt stated that she is having surgery on her Lt knee and needs clearnce    HPI: Alison [name] is a 61 y.o. female who presents to the office today to address the problems listed above in the chief complaint. Preop clearance: had blood work in october and TSH was 10.2. [facility]. Reports she has felt fine. No changes in compliance with 151mg pills or other new meds. Reports takes on empty stomacy in am. So in last 2 weeks she went back to the 1289m dose.  Lab Results  Component Value Date   TSH 3.79 04/30/2022   Wt Readings from Last 3 Encounters:  10/08/22 200 lb 12.8 oz (91.1 kg)  06/22/22 204 lb 9.6 oz (92.8 kg)  03/14/22 206 lb (93.4 kg)    Blood pressure remains well controlled.  GAD: requesting refill of xanax; stressful at home at time (mom with FTD) and elderly father is struggling; helps her to calm down and get to sleep intermittently.  Knee OA for surgical clearance.  Assessment  1. Essential hypertension   2. GAD (generalized anxiety disorder)   3. Postablative hypothyroidism   4. Primary insomnia      Plan  Preop clearance:  need to clarify hypothyroidism control. Will recheck TSH and adjust meds. Discussed proper way of taking medication. Clnically she is euthyroid GAD: xanax refilled for prn use. Continue lexapro 10 daily Insmonnia: adjsutment. Prn xanax Bp is controlled.   Follow up: as scheduled  12/24/2022  Orders Placed This Encounter  Procedures   TSH   Meds ordered this encounter  Medications   ALPRAZolam (XANAX) 0.25 MG tablet    Sig: Take 1 tablet (0.25 mg total) by mouth at bedtime as needed for anxiety or sleep.    Dispense:  20 tablet    Refill:  0   pantoprazole (PROTONIX) 40 MG tablet    Sig: Take 1 tablet (40 mg total) by mouth daily.    Dispense:  90 tablet    Refill:  3      I reviewed the patients updated PMH, FH, and SocHx.    Patient  Active Problem List   Diagnosis Date Noted   Postablative hypothyroidism 02/03/2020    Priority: High   Systolic murmur 1088/91/6945  Priority: High   GAD (generalized anxiety disorder) 06/24/2018    Priority: High   Mixed hyperlipidemia 06/24/2018    Priority: High   Essential hypertension 05/31/2018    Priority: High   Family history of premature CAD 12/29/2013    Priority: High   History of TIA (transient ischemic attack) 12/10/1980    Priority: High   Gastroesophageal reflux disease 10/11/2020    Priority: Medium    S/P gastric sleeve procedure 10/11/2020    Priority: Medium    Mild intermittent asthma without complication 0703/88/8280  Priority: Medium    Primary osteoarthritis of both knees 09/11/2016    Priority: Medium    Gastric banding status 10/07/2015    Priority: Medium    Restless legs syndrome (RLS) 02/14/2015    Priority: Medium    Insomnia 12/29/2013    Priority: Medium    Current Meds  Medication Sig   albuterol (VENTOLIN HFA) 108 (90 Base) MCG/ACT inhaler Inhale 2 puffs into the lungs every 6 (six) hours as needed. For asthma   calcium carbonate (OS-CAL) 1250 (500 Ca) MG chewable tablet  Chew 1 tablet by mouth 3 (three) times daily.   Cobalamin Combinations (OPURITY B12/FOLIC ACID) 6962-952 MCG TABS Take 1 tablet by mouth daily.   escitalopram (LEXAPRO) 10 MG tablet Take 1 tablet (10 mg total) by mouth daily.   famotidine (PEPCID) 40 MG tablet Take 1 tablet (40 mg total) by mouth daily.   levothyroxine (SYNTHROID) 100 MCG tablet TAKE 1 TABLET BY MOUTH DAILY BEFORE BREAKFAST.   rOPINIRole (REQUIP) 3 MG tablet Take 1 tablet (3 mg total) by mouth at bedtime.   rosuvastatin (CRESTOR) 40 MG tablet Take 1 tablet (40 mg total) by mouth at bedtime.   traZODone (DESYREL) 50 MG tablet Take 1-2 tablets (50-100 mg total) by mouth at bedtime as needed for sleep.   [DISCONTINUED] ALPRAZolam (XANAX) 0.25 MG tablet Take 1 tablet (0.25 mg total) by mouth at bedtime as  needed for anxiety or sleep.   [DISCONTINUED] pantoprazole (PROTONIX) 40 MG tablet Take 1 tablet (40 mg total) by mouth daily.    Allergies: Patient is allergic to aspirin, cherry, other, soy allergy, and penicillins. Family History: Patient family history includes Anesthesia problems in her sister; Breast cancer (age of onset: 42) in her maternal aunt; Cancer in her maternal aunt; Glaucoma in her mother; Heart disease in her father; Hyperthyroidism in her mother; Migraines in her sister; Myasthenia gravis in her paternal grandfather; Ovarian cancer in her maternal grandmother; Stroke in her father; Uterine cancer in her maternal grandmother. Social History:  Patient  reports that she has never smoked. She has never used smokeless tobacco. She reports that she does not currently use alcohol. She reports that she does not use drugs.  Review of Systems: Constitutional: Negative for fever malaise or anorexia Cardiovascular: negative for chest pain Respiratory: negative for SOB or persistent cough Gastrointestinal: negative for abdominal pain  Objective  Vitals: BP 130/82   Pulse 72   Temp 98.1 F (36.7 C)   Ht '5\' 2"'$  (1.575 m)   Wt 200 lb 12.8 oz (91.1 kg)   SpO2 97%   BMI 36.73 kg/m  General: no acute distress , A&Ox3 HEENT: PEERL, conjunctiva normal, neck is supple Cardiovascular:  RRR without murmur or gallop.  Respiratory:  Good breath sounds bilaterally, CTAB with normal respiratory effort Skin:  Warm, no rashes    Commons side effects, risks, benefits, and alternatives for medications and treatment plan prescribed today were discussed, and the patient expressed understanding of the given instructions. Patient is instructed to call or message via MyChart if he/she has any questions or concerns regarding our treatment plan. No barriers to understanding were identified. We discussed Red Flag symptoms and signs in detail. Patient expressed understanding regarding what to do in case  of urgent or emergency type symptoms.  Medication list was reconciled, printed and provided to the patient in AVS. Patient instructions and summary information was reviewed with the patient as documented in the AVS. This note was prepared with assistance of Dragon voice recognition software. Occasional wrong-word or sound-a-like substitutions may have occurred due to the inherent limitations of voice recognition software  This visit occurred during the SARS-CoV-2 public health emergency.  Safety protocols were in place, including screening questions prior to the visit, additional usage of staff PPE, and extensive cleaning of exam room while observing appropriate contact time as indicated for disinfecting solutions.

## 2022-10-08 NOTE — Patient Instructions (Signed)
Please follow up as scheduled for your next visit with me: 12/24/2022   If you have any questions or concerns, please don't hesitate to send me a message via MyChart or call the office at [phone]. Thank you for visiting with [place] today! It's our pleasure caring for you.   Please take your thyroid medication as follows: Take every morning on an empty stomach Do not take food or drink for at least 30 minutes Do not take with PPI's (prilosec, protonix etc), Calcium, Iron or multivitamins; these need to be taken at least 4 hours apart from the thyroid medication.

## 2022-10-09 LAB — TSH: TSH: 0.14 u[IU]/mL — ABNORMAL LOW (ref 0.35–5.50)

## 2022-10-19 NOTE — Progress Notes (Signed)
Surgery orders requested via Epic inbox. °

## 2022-10-22 ENCOUNTER — Ambulatory Visit: Payer: Self-pay | Admitting: Physician Assistant

## 2022-10-22 DIAGNOSIS — G8929 Other chronic pain: Secondary | ICD-10-CM

## 2022-10-22 NOTE — H&P (View-Only) (Signed)
TOTAL KNEE ADMISSION H&P  Patient is being admitted for left total knee arthroplasty.  Subjective:  Chief Complaint:left knee pain.  HPI: Alison [name], 61 y.o. female, has a history of pain and functional disability in the left knee due to arthritis and has failed non-surgical conservative treatments for greater than 12 weeks to includeNSAID's and/or analgesics, corticosteriod injections, flexibility and strengthening excercises, and activity modification.  Onset of symptoms was gradual, starting 4 years ago with gradually worsening course since that time. The patient noted no past surgery on the left knee(s).  Patient currently rates pain in the left knee(s) at 9 out of 10 with activity. Patient has night pain, worsening of pain with activity and weight bearing, pain that interferes with activities of daily living, pain with passive range of motion, crepitus, and joint swelling.  Patient has evidence of periarticular osteophytes and joint space narrowing by imaging studies. There is no active infection.  Patient Active Problem List   Diagnosis Date Noted   Gastroesophageal reflux disease 10/11/2020   S/P gastric sleeve procedure 10/11/2020   Postablative hypothyroidism 84/13/2440   Systolic murmur 10/06/2535   GAD (generalized anxiety disorder) 06/24/2018   Mild intermittent asthma without complication 64/40/3474   Mixed hyperlipidemia 06/24/2018   Essential hypertension 05/31/2018   Primary osteoarthritis of both knees 09/11/2016   Gastric banding status 10/07/2015   Restless legs syndrome (RLS) 02/14/2015   Family history of premature CAD 12/29/2013   Insomnia 12/29/2013   History of TIA (transient ischemic attack) 12/10/1980   Past Medical History:  Diagnosis Date   Anemia    prior to hysterectomy    Anxiety    Arthritis    LEFT KNEE   Asthma    Cervical myelopathy ([place]) 08/28/2018   Family history of adverse reaction to anesthesia    Sister PONV   GERD (gastroesophageal  reflux disease)    Heart murmur    Hx-TIA (transient ischemic attack) 1992   Hyperlipidemia    Hypertension    Hypothyroidism    Obesity    Pneumonia    Polycystic ovarian syndrome     Past Surgical History:  Procedure Laterality Date   ABDOMINAL HYSTERECTOMY  2009   Partial   ANTERIOR CERVICAL DECOMPRESSION/DISCECTOMY FUSION 4 LEVELS N/A 08/28/2018   Procedure: ANTERIOR CERVICAL DECOMPRESSION FUSION, CERVICAL 4-5, CERVICAL 5-6, CERVICAL 6-7 WITH INSTRUMENTATION AND ALLOGRAFT;  Surgeon: Phylliss Bob, MD;  Location: [facility];  Service: Orthopedics;  Laterality: N/A;   BREAST LUMPECTOMY Left 06/27/2020   Procedure: LEFT BREAST LUMPECTOMY;  Surgeon: Coralie Keens, MD;  Location: [facility];  Service: General;  Laterality: Left;  LMA   DIAGNOSTIC LAPAROSCOPY     FEMUR FRACTURE SURGERY  2008   Left   FRACTURE SURGERY     I & D EXTREMITY Left 06/01/2018   Procedure: IRRIGATION AND DEBRIDEMENT LEFT CALF;  Surgeon: Mcarthur Rossetti, MD;  Location: [facility];  Service: Orthopedics;  Laterality: Left;   KNEE CARTILAGE SURGERY  2005   LAPAROSCOPIC GASTRIC BAND REMOVAL WITH LAPAROSCOPIC GASTRIC SLEEVE RESECTION N/A 05/03/2020   Procedure: LAPAROSCOPIC GASTRIC BAND REMOVAL WITH LAPAROSCOPIC GASTRIC SLEEVE RESECTION; UPPER ENDO AND ERAS PATHWAY;  Surgeon: Kinsinger, Arta Bruce, MD;  Location: [facility];  Service: General;  Laterality: N/A;   LAPAROSCOPIC GASTRIC BANDING  11/12/2011   Procedure: LAPAROSCOPIC GASTRIC BANDING;  Surgeon: Judieth Keens, DO;  Location: [facility];  Service: General;  Laterality: N/A;  nathanson liver retractor/ endostitch available 2-0 surgidac refills/ [facility] needle drivers available/ [facility]  Doctor to [facility]   TONSILLECTOMY  2007    Current Outpatient Medications  Medication Sig Dispense Refill Last Dose   acetaminophen (TYLENOL) 500 MG tablet Take 1,000 mg by mouth every 6 (six) hours as needed for moderate pain.      albuterol (VENTOLIN HFA) 108 (90 Base) MCG/ACT  inhaler Inhale 2 puffs into the lungs every 6 (six) hours as needed. For asthma 18 g 2    ALPRAZolam (XANAX) 0.25 MG tablet Take 1 tablet (0.25 mg total) by mouth at bedtime as needed for anxiety or sleep. 20 tablet 0    calcium carbonate (TUMS - DOSED IN MG ELEMENTAL CALCIUM) 500 MG chewable tablet Chew 1 tablet by mouth 3 (three) times daily as needed for indigestion or heartburn.      escitalopram (LEXAPRO) 10 MG tablet Take 1 tablet (10 mg total) by mouth daily. 90 tablet 3    esomeprazole (NEXIUM) 40 MG capsule Take 40 mg by mouth every evening.      famotidine (PEPCID) 40 MG tablet Take 1 tablet (40 mg total) by mouth daily. (Patient not taking: Reported on 10/18/2022) 30 tablet 0    hydrochlorothiazide (HYDRODIURIL) 25 MG tablet Take 25 mg by mouth every other day.      levothyroxine (SYNTHROID) 100 MCG tablet TAKE 1 TABLET BY MOUTH DAILY BEFORE BREAKFAST. 90 tablet 0    Multiple Vitamins-Minerals (HAIR/SKIN/NAILS) TABS Take 1 tablet by mouth daily.      pantoprazole (PROTONIX) 40 MG tablet Take 1 tablet (40 mg total) by mouth daily. (Patient taking differently: Take 40 mg by mouth in the morning.) 90 tablet 3    rOPINIRole (REQUIP) 3 MG tablet Take 1 tablet (3 mg total) by mouth at bedtime. 90 tablet 3    rosuvastatin (CRESTOR) 40 MG tablet Take 1 tablet (40 mg total) by mouth at bedtime. 90 tablet 3    sucralfate (CARAFATE) 1 g tablet Take 1 g by mouth 2 (two) times daily.      traZODone (DESYREL) 50 MG tablet Take 1-2 tablets (50-100 mg total) by mouth at bedtime as needed for sleep. (Patient not taking: Reported on 10/18/2022) 180 tablet 3    No current facility-administered medications for this visit.   Allergies  Allergen Reactions   Aspirin Anaphylaxis   Cherry Hives and Shortness Of Breath   Other Hives and Itching    Walnuts and pecans - cause itching and hives in mouth   Soy Allergy Hives    " hives in mouth and wheezing"    Penicillins Itching    Has patient had a PCN  reaction causing immediate rash, facial/tongue/throat swelling, SOB or lightheadedness with hypotension: No Has patient had a PCN reaction causing severe rash involving mucus membranes or skin necrosis: No Has patient had a PCN reaction that required hospitalization: No Has patient had a PCN reaction occurring within the last 10 years: No If all of the above answers are "NO", then may proceed with Cephalosporin use.     Social History   Tobacco Use   Smoking status: Never   Smokeless tobacco: Never  Substance Use Topics   Alcohol use: Not Currently    Comment: rare    Family History  Problem Relation Age of Onset   Glaucoma Mother    Hyperthyroidism Mother    Heart disease Father    Stroke Father    Anesthesia problems Sister    Migraines Sister    Cancer Maternal Aunt  Breast Cancer   Breast cancer Maternal Aunt 37   Ovarian cancer Maternal Grandmother    Uterine cancer Maternal Grandmother    Myasthenia gravis Paternal Grandfather      Review of Systems  Musculoskeletal:  Positive for arthralgias.  All other systems reviewed and are negative.   Objective:  Physical Exam Constitutional:      Appearance: Normal appearance.  HENT:     Head: Normocephalic and atraumatic.     Nose: Nose normal.  Eyes:     Extraocular Movements: Extraocular movements intact.     Pupils: Pupils are equal, round, and reactive to light.  Cardiovascular:     Rate and Rhythm: Normal rate and regular rhythm.     Pulses: Normal pulses.     Heart sounds: Murmur heard.  Pulmonary:     Effort: Pulmonary effort is normal. No respiratory distress.     Breath sounds: Normal breath sounds. No wheezing.  Abdominal:     General: Abdomen is flat. Bowel sounds are normal. There is no distension.     Palpations: Abdomen is soft.     Tenderness: There is no abdominal tenderness.  Musculoskeletal:     Cervical back: Normal range of motion and neck supple.     Left knee: Swelling, bony  tenderness and crepitus present. No effusion or erythema. Decreased range of motion. Tenderness present.  Lymphadenopathy:     Cervical: No cervical adenopathy.  Skin:    General: Skin is warm and dry.     Findings: No erythema or rash.  Neurological:     General: No focal deficit present.     Mental Status: She is alert and oriented to person, place, and time.  Psychiatric:        Mood and Affect: Mood normal.        Behavior: Behavior normal.     Vital signs in last 24 hours: '@VSRANGES'$ @  Labs:   Estimated body mass index is 36.73 kg/m as calculated from the following:   Height as of 10/08/22: '5\' 2"'$  (1.575 m).   Weight as of 10/08/22: 91.1 kg.   Imaging Review Plain radiographs demonstrate moderate degenerative joint disease of the left knee(s). The overall alignment ismild valgus. The bone quality appears to be good for age and reported activity level.      Assessment/Plan:  End stage arthritis, left knee   The patient history, physical examination, clinical judgment of the provider and imaging studies are consistent with end stage degenerative joint disease of the left knee(s) and total knee arthroplasty is deemed medically necessary. The treatment options including medical management, injection therapy arthroscopy and arthroplasty were discussed at length. The risks and benefits of total knee arthroplasty were presented and reviewed. The risks due to aseptic loosening, infection, stiffness, patella tracking problems, thromboembolic complications and other imponderables were discussed. The patient acknowledged the explanation, agreed to proceed with the plan and consent was signed. Patient is being admitted for inpatient treatment for surgery, pain control, PT, OT, prophylactic antibiotics, VTE prophylaxis, progressive ambulation and ADL's and discharge planning. The patient is planning to be discharged  home with outpt PT>     Patient's anticipated LOS is less than 2  midnights, meeting these requirements: - Younger than 38 - Lives within 1 hour of care - Has a competent adult at home to recover with post-op recover - NO history of  - Chronic pain requiring opiods  - Diabetes  - Coronary Artery Disease  - Heart failure  -  Heart attack  - Stroke  - DVT/VTE  - Cardiac arrhythmia  - Respiratory Failure/COPD  - Renal failure  - Anemia  - Advanced Liver disease

## 2022-10-22 NOTE — Progress Notes (Addendum)
COVID Vaccine received:  _0  No _1  Yes Date of any COVID positive Test in last 90 days:  PCP - Billey Chang, MD Medical clearance- 10-08-22 Epic note Cardiologist -   Chest x-ray - 05-16-2021  Epic EKG -  05-16-2021  Will repeat at PST Stress Test -  ECHO -  Cardiac Cath -   PCR screen: _2  Ordered & Completed                      _3   No Order but Needs PROFEND                      _4   N/A for this surgery  Surgery Plan:  _5  Ambulatory                            _6  Outpatient in bed                            _7  Admit  Anesthesia:    _8  General  _9  Spinal                           _10   Choice _11   MAC  Pacemaker / ICD device _12  No _13  Yes        Device order form faxed _14  No    _15   Yes      Faxed to:  Spinal Cord Stimulator:_16  No _17  Yes      (Remind patient to bring remote DOS) Other Implants:   History of Sleep Apnea? _18  No _19  Yes   CPAP used?- _20  No _21  Yes    Does the patient monitor blood sugar? _22  No _23  Yes  _24  N/A Does patient have a [employer] or Dexacom? _25  No _26  Yes   Fasting Blood Sugar Ranges-  Checks Blood Sugar _____ times a day  Last dose of GLP1 agonist-  GLP1 instructions:  Last dose of SGLT-2 inhibitors-  SGLT-2 instructions:   Blood Thinner / Instructions: none Aspirin Instructions:  ERAS Protocol Ordered: _27  No  _28  Yes PRE-SURGERY _29  ENSURE  _30  G2  _31  No Drink Ordered  Patient is to be NPO after: 08:00am  Comments: Vancomycin is ordered  Activity level: Patient can / can not climb a flight of stairs without difficulty; _32  No CP  _33  No SOB, but would have ______   Patient can / can not perform ADLs without assistance.   Anesthesia review: HTN, Systolic murmur- not recent, ACDF (4 levels Dr. Lynann Bologna 08-28-2018)  Patient denies shortness of breath, fever, cough and chest pain at PAT appointment.  Patient verbalized understanding and agreement to the Pre-Surgical Instructions that were given to them at this PAT appointment.  Patient was also educated of the need to review these PAT instructions again prior to his/her surgery.I reviewed the appropriate phone numbers to call if they have any and questions or concerns.

## 2022-10-22 NOTE — H&P (Signed)
TOTAL KNEE ADMISSION H&P  Patient is being admitted for left total knee arthroplasty.  Subjective:  Chief Complaint:left knee pain.  HPI: Alison [name], 61 y.o. female, has a history of pain and functional disability in the left knee due to arthritis and has failed non-surgical conservative treatments for greater than 12 weeks to includeNSAID's and/or analgesics, corticosteriod injections, flexibility and strengthening excercises, and activity modification.  Onset of symptoms was gradual, starting 4 years ago with gradually worsening course since that time. The patient noted no past surgery on the left knee(s).  Patient currently rates pain in the left knee(s) at 9 out of 10 with activity. Patient has night pain, worsening of pain with activity and weight bearing, pain that interferes with activities of daily living, pain with passive range of motion, crepitus, and joint swelling.  Patient has evidence of periarticular osteophytes and joint space narrowing by imaging studies. There is no active infection.  Patient Active Problem List   Diagnosis Date Noted   Gastroesophageal reflux disease 10/11/2020   S/P gastric sleeve procedure 10/11/2020   Postablative hypothyroidism 67/89/3810   Systolic murmur 17/51/0258   GAD (generalized anxiety disorder) 06/24/2018   Mild intermittent asthma without complication 52/77/8242   Mixed hyperlipidemia 06/24/2018   Essential hypertension 05/31/2018   Primary osteoarthritis of both knees 09/11/2016   Gastric banding status 10/07/2015   Restless legs syndrome (RLS) 02/14/2015   Family history of premature CAD 12/29/2013   Insomnia 12/29/2013   History of TIA (transient ischemic attack) 12/10/1980   Past Medical History:  Diagnosis Date   Anemia    prior to hysterectomy    Anxiety    Arthritis    LEFT KNEE   Asthma    Cervical myelopathy ([place]) 08/28/2018   Family history of adverse reaction to anesthesia    Sister PONV   GERD (gastroesophageal  reflux disease)    Heart murmur    Hx-TIA (transient ischemic attack) 1992   Hyperlipidemia    Hypertension    Hypothyroidism    Obesity    Pneumonia    Polycystic ovarian syndrome     Past Surgical History:  Procedure Laterality Date   ABDOMINAL HYSTERECTOMY  2009   Partial   ANTERIOR CERVICAL DECOMPRESSION/DISCECTOMY FUSION 4 LEVELS N/A 08/28/2018   Procedure: ANTERIOR CERVICAL DECOMPRESSION FUSION, CERVICAL 4-5, CERVICAL 5-6, CERVICAL 6-7 WITH INSTRUMENTATION AND ALLOGRAFT;  Surgeon: Phylliss Bob, MD;  Location: [facility];  Service: Orthopedics;  Laterality: N/A;   BREAST LUMPECTOMY Left 06/27/2020   Procedure: LEFT BREAST LUMPECTOMY;  Surgeon: Coralie Keens, MD;  Location: [facility];  Service: General;  Laterality: Left;  LMA   DIAGNOSTIC LAPAROSCOPY     FEMUR FRACTURE SURGERY  2008   Left   FRACTURE SURGERY     I & D EXTREMITY Left 06/01/2018   Procedure: IRRIGATION AND DEBRIDEMENT LEFT CALF;  Surgeon: Mcarthur Rossetti, MD;  Location: [facility];  Service: Orthopedics;  Laterality: Left;   KNEE CARTILAGE SURGERY  2005   LAPAROSCOPIC GASTRIC BAND REMOVAL WITH LAPAROSCOPIC GASTRIC SLEEVE RESECTION N/A 05/03/2020   Procedure: LAPAROSCOPIC GASTRIC BAND REMOVAL WITH LAPAROSCOPIC GASTRIC SLEEVE RESECTION; UPPER ENDO AND ERAS PATHWAY;  Surgeon: Kinsinger, Arta Bruce, MD;  Location: [facility];  Service: General;  Laterality: N/A;   LAPAROSCOPIC GASTRIC BANDING  11/12/2011   Procedure: LAPAROSCOPIC GASTRIC BANDING;  Surgeon: Judieth Keens, DO;  Location: [facility];  Service: General;  Laterality: N/A;  nathanson liver retractor/ endostitch available 2-0 surgidac refills/ [facility] needle drivers available/ [facility]  Doctor to [facility]ctor   TONSILLECTOMY  2007    Current Outpatient Medications  Medication Sig Dispense Refill Last Dose   acetaminophen (TYLENOL) 500 MG tablet Take 1,000 mg by mouth every 6 (six) hours as needed for moderate pain.      albuterol (VENTOLIN HFA) 108 (90 Base) MCG/ACT  inhaler Inhale 2 puffs into the lungs every 6 (six) hours as needed. For asthma 18 g 2    ALPRAZolam (XANAX) 0.25 MG tablet Take 1 tablet (0.25 mg total) by mouth at bedtime as needed for anxiety or sleep. 20 tablet 0    calcium carbonate (TUMS - DOSED IN MG ELEMENTAL CALCIUM) 500 MG chewable tablet Chew 1 tablet by mouth 3 (three) times daily as needed for indigestion or heartburn.      escitalopram (LEXAPRO) 10 MG tablet Take 1 tablet (10 mg total) by mouth daily. 90 tablet 3    esomeprazole (NEXIUM) 40 MG capsule Take 40 mg by mouth every evening.      famotidine (PEPCID) 40 MG tablet Take 1 tablet (40 mg total) by mouth daily. (Patient not taking: Reported on 10/18/2022) 30 tablet 0    hydrochlorothiazide (HYDRODIURIL) 25 MG tablet Take 25 mg by mouth every other day.      levothyroxine (SYNTHROID) 100 MCG tablet TAKE 1 TABLET BY MOUTH DAILY BEFORE BREAKFAST. 90 tablet 0    Multiple Vitamins-Minerals (HAIR/SKIN/NAILS) TABS Take 1 tablet by mouth daily.      pantoprazole (PROTONIX) 40 MG tablet Take 1 tablet (40 mg total) by mouth daily. (Patient taking differently: Take 40 mg by mouth in the morning.) 90 tablet 3    rOPINIRole (REQUIP) 3 MG tablet Take 1 tablet (3 mg total) by mouth at bedtime. 90 tablet 3    rosuvastatin (CRESTOR) 40 MG tablet Take 1 tablet (40 mg total) by mouth at bedtime. 90 tablet 3    sucralfate (CARAFATE) 1 g tablet Take 1 g by mouth 2 (two) times daily.      traZODone (DESYREL) 50 MG tablet Take 1-2 tablets (50-100 mg total) by mouth at bedtime as needed for sleep. (Patient not taking: Reported on 10/18/2022) 180 tablet 3    No current facility-administered medications for this visit.   Allergies  Allergen Reactions   Aspirin Anaphylaxis   Cherry Hives and Shortness Of Breath   Other Hives and Itching    Walnuts and pecans - cause itching and hives in mouth   Soy Allergy Hives    " hives in mouth and wheezing"    Penicillins Itching    Has patient had a PCN  reaction causing immediate rash, facial/tongue/throat swelling, SOB or lightheadedness with hypotension: No Has patient had a PCN reaction causing severe rash involving mucus membranes or skin necrosis: No Has patient had a PCN reaction that required hospitalization: No Has patient had a PCN reaction occurring within the last 10 years: No If all of the above answers are "NO", then may proceed with Cephalosporin use.     Social History   Tobacco Use   Smoking status: Never   Smokeless tobacco: Never  Substance Use Topics   Alcohol use: Not Currently    Comment: rare    Family History  Problem Relation Age of Onset   Glaucoma Mother    Hyperthyroidism Mother    Heart disease Father    Stroke Father    Anesthesia problems Sister    Migraines Sister    Cancer Maternal Aunt  Breast Cancer   Breast cancer Maternal Aunt 37   Ovarian cancer Maternal Grandmother    Uterine cancer Maternal Grandmother    Myasthenia gravis Paternal Grandfather      Review of Systems  Musculoskeletal:  Positive for arthralgias.  All other systems reviewed and are negative.   Objective:  Physical Exam Constitutional:      Appearance: Normal appearance.  HENT:     Head: Normocephalic and atraumatic.     Nose: Nose normal.  Eyes:     Extraocular Movements: Extraocular movements intact.     Pupils: Pupils are equal, round, and reactive to light.  Cardiovascular:     Rate and Rhythm: Normal rate and regular rhythm.     Pulses: Normal pulses.     Heart sounds: Murmur heard.  Pulmonary:     Effort: Pulmonary effort is normal. No respiratory distress.     Breath sounds: Normal breath sounds. No wheezing.  Abdominal:     General: Abdomen is flat. Bowel sounds are normal. There is no distension.     Palpations: Abdomen is soft.     Tenderness: There is no abdominal tenderness.  Musculoskeletal:     Cervical back: Normal range of motion and neck supple.     Left knee: Swelling, bony  tenderness and crepitus present. No effusion or erythema. Decreased range of motion. Tenderness present.  Lymphadenopathy:     Cervical: No cervical adenopathy.  Skin:    General: Skin is warm and dry.     Findings: No erythema or rash.  Neurological:     General: No focal deficit present.     Mental Status: She is alert and oriented to person, place, and time.  Psychiatric:        Mood and Affect: Mood normal.        Behavior: Behavior normal.     Vital signs in last 24 hours: '@VSRANGES'$ @  Labs:   Estimated body mass index is 36.73 kg/m as calculated from the following:   Height as of 10/08/22: '5\' 2"'$  (1.575 m).   Weight as of 10/08/22: 91.1 kg.   Imaging Review Plain radiographs demonstrate moderate degenerative joint disease of the left knee(s). The overall alignment ismild valgus. The bone quality appears to be good for age and reported activity level.      Assessment/Plan:  End stage arthritis, left knee   The patient history, physical examination, clinical judgment of the provider and imaging studies are consistent with end stage degenerative joint disease of the left knee(s) and total knee arthroplasty is deemed medically necessary. The treatment options including medical management, injection therapy arthroscopy and arthroplasty were discussed at length. The risks and benefits of total knee arthroplasty were presented and reviewed. The risks due to aseptic loosening, infection, stiffness, patella tracking problems, thromboembolic complications and other imponderables were discussed. The patient acknowledged the explanation, agreed to proceed with the plan and consent was signed. Patient is being admitted for inpatient treatment for surgery, pain control, PT, OT, prophylactic antibiotics, VTE prophylaxis, progressive ambulation and ADL's and discharge planning. The patient is planning to be discharged  home with outpt PT>     Patient's anticipated LOS is less than 2  midnights, meeting these requirements: - Younger than 27 - Lives within 1 hour of care - Has a competent adult at home to recover with post-op recover - NO history of  - Chronic pain requiring opiods  - Diabetes  - Coronary Artery Disease  - Heart failure  -  Heart attack  - Stroke  - DVT/VTE  - Cardiac arrhythmia  - Respiratory Failure/COPD  - Renal failure  - Anemia  - Advanced Liver disease

## 2022-10-22 NOTE — Patient Instructions (Signed)
SURGICAL WAITING ROOM VISITATION Patients having surgery or a procedure may have no more than 2 support people in the waiting area - these visitors may rotate in the visitor waiting room.   Children under the age of 29 must have an adult with them who is not the patient. If the patient needs to stay at the hospital during part of their recovery, the visitor guidelines for inpatient rooms apply.  PRE-OP VISITATION  Pre-op nurse will coordinate an appropriate time for 1 support person to accompany the patient in pre-op.  This support person may not rotate.  This visitor will be contacted when the time is appropriate for the visitor to come back in the pre-op area.  Please refer to the [facility] website for the visitor guidelines for Inpatients (after your surgery is over and you are in a regular room).  You are not required to quarantine at this time prior to your surgery. However, you must do this: Hand Hygiene often Do NOT share personal items Notify your provider if you are in close contact with someone who has COVID or you develop fever 100.4 or greater, new onset of sneezing, cough, sore throat, shortness of breath or body aches.  If you test positive for Covid or have been in contact with anyone that has tested positive in the last 10 days please notify you surgeon.    Your procedure is scheduled on:  Wednesday  October 31, 2022  Report to [facility] Main Entrance: [address] entrance where the [employer] is available.   Report to admitting at:  08:30   AM  +++++Call this number if you have any questions or problems the morning of surgery [phone]  Do not eat food after Midnight the night prior to your surgery/procedure.  After Midnight you may have the following liquids until  08:00 AM DAY OF SURGERY  Clear Liquid Diet Water Black Coffee (sugar ok, NO MILK/CREAM OR CREAMERS)  Tea (sugar ok, NO MILK/CREAM OR CREAMERS) regular and decaf                              Plain Jell-O  with no fruit (NO RED)                                           Fruit ices (not with fruit pulp, NO RED)                                     Popsicles (NO RED)                                                                  Juice: apple, WHITE grape, WHITE cranberry Sports drinks like Gatorade or Powerade (NO RED)                    The day of surgery:  Drink ONE (1) Pre-Surgery Clear Ensure at  08:00 AM the morning of surgery. Drink in one sitting. Do not sip.  This drink was given  to you during your hospital pre-op appointment visit. Nothing else to drink after completing the Pre-Surgery Clear Ensure : No candy, chewing gum or throat lozenges.    FOLLOW ANY ADDITIONAL PRE OP INSTRUCTIONS YOU RECEIVED FROM YOUR SURGEON'S OFFICE!!!   Oral Hygiene is also important to reduce your risk of infection.        Remember - BRUSH YOUR TEETH THE MORNING OF SURGERY WITH YOUR REGULAR TOOTHPASTE  Do NOT smoke after Midnight the night before surgery.  Take ONLY these medicines the morning of surgery with A SIP OF WATER: escitalopram (Lexapro), Levothyroxine (Synthroid)                   You may not have any metal on your body including hair pins, jewelry, and body piercing  Do not wear make-up, lotions, powders, perfumes, or deodorant  Do not wear nail polish including gel and S&S, artificial / acrylic nails, or any other type of covering on natural nails including finger and toenails. If you have artificial nails, gel coating, etc., that needs to be removed by a nail salon, Please have this removed prior to surgery. Not doing so may mean that your surgery could be cancelled or delayed if the Surgeon or anesthesia staff feels like they are unable to monitor you safely.   Do not shave 48 hours prior to surgery to avoid nicks in your skin which may contribute to postoperative infections.   Contacts, Hearing Aids, dentures or bridgework may not be worn into surgery.   Patients  discharged on the day of surgery will not be allowed to drive home.  Someone NEEDS to stay with you for the first 24 hours after anesthesia.  Do not bring your home medications to the hospital. The Pharmacy will dispense medications listed on your medication list to you during your admission in the Hospital.  Please read over the following fact sheets you were given: IF YOU HAVE QUESTIONS ABOUT YOUR PRE-OP INSTRUCTIONS, PLEASE CALL [phone]  ([place])   [facility] - Preparing for Surgery Before surgery, you can play an important role.  Because skin is not sterile, your skin needs to be as free of germs as possible.  You can reduce the number of germs on your skin by washing with CHG (chlorahexidine gluconate) soap before surgery.  CHG is an antiseptic cleaner which kills germs and bonds with the skin to continue killing germs even after washing. Please DO NOT use if you have an allergy to CHG or antibacterial soaps.  If your skin becomes reddened/irritated stop using the CHG and inform your nurse when you arrive at Short Stay. Do not shave (including legs and underarms) for at least 48 hours prior to the first CHG shower.  You may shave your face/neck.  Please follow these instructions carefully:  1.  Shower with CHG Soap the night before surgery and the  morning of surgery.  2.  If you choose to wash your hair, wash your hair first as usual with your normal  shampoo.  3.  After you shampoo, rinse your hair and body thoroughly to remove the shampoo.                             4.  Use CHG as you would any other liquid soap.  You can apply chg directly to the skin and wash.  Gently with a scrungie or clean washcloth.  5.  Apply the CHG  Soap to your body ONLY FROM THE NECK DOWN.   Do not use on face/ open                           Wound or open sores. Avoid contact with eyes, ears mouth and genitals (private parts).                       Wash face,  Genitals (private parts) with your normal soap.              6.  Wash thoroughly, paying special attention to the area where your  surgery  will be performed.  7.  Thoroughly rinse your body with warm water from the neck down.  8.  DO NOT shower/wash with your normal soap after using and rinsing off the CHG Soap.            9.  Pat yourself dry with a clean towel.            10.  Wear clean pajamas.            11.  Place clean sheets on your bed the night of your first shower and do not  sleep with pets.  ON THE DAY OF SURGERY : Do not apply any lotions/deodorants the morning of surgery.  Please wear clean clothes to the hospital/surgery center.    FAILURE TO FOLLOW THESE INSTRUCTIONS MAY RESULT IN THE CANCELLATION OF YOUR SURGERY  PATIENT SIGNATURE_________________________________  NURSE SIGNATURE__________________________________  ________________________________________________________________________       Alison [name]    An incentive spirometer is a tool that can help keep your lungs clear and active. This tool measures how well you are filling your lungs with each breath. Taking long deep breaths may help reverse or decrease the chance of developing breathing (pulmonary) problems (especially infection) following: A long period of time when you are unable to move or be active. BEFORE THE PROCEDURE  If the spirometer includes an indicator to show your best effort, your nurse or respiratory therapist will set it to a desired goal. If possible, sit up straight or lean slightly forward. Try not to slouch. Hold the incentive spirometer in an upright position. INSTRUCTIONS FOR USE  Sit on the edge of your bed if possible, or sit up as far as you can in bed or on a chair. Hold the incentive spirometer in an upright position. Breathe out normally. Place the mouthpiece in your mouth and seal your lips tightly around it. Breathe in slowly and as deeply as possible, raising the piston or the ball toward the top of the  column. Hold your breath for 3-5 seconds or for as long as possible. Allow the piston or ball to fall to the bottom of the column. Remove the mouthpiece from your mouth and breathe out normally. Rest for a few seconds and repeat Steps 1 through 7 at least 10 times every 1-2 hours when you are awake. Take your time and take a few normal breaths between deep breaths. The spirometer may include an indicator to show your best effort. Use the indicator as a goal to work toward during each repetition. After each set of 10 deep breaths, practice coughing to be sure your lungs are clear. If you have an incision (the cut made at the time of surgery), support your incision when coughing by placing a pillow or rolled up towels firmly against it. Once you  are able to get out of bed, walk around indoors and cough well. You may stop using the incentive spirometer when instructed by your caregiver.  RISKS AND COMPLICATIONS Take your time so you do not get dizzy or light-headed. If you are in pain, you may need to take or ask for pain medication before doing incentive spirometry. It is harder to take a deep breath if you are having pain. AFTER USE Rest and breathe slowly and easily. It can be helpful to keep track of a log of your progress. Your caregiver can provide you with a simple table to help with this. If you are using the spirometer at home, follow these instructions: Alison [name] IF:  You are having difficultly using the spirometer. You have trouble using the spirometer as often as instructed. Your pain medication is not giving enough relief while using the spirometer. You develop fever of 100.5 F (38.1 C) or higher.                                                                                                    SEEK IMMEDIATE MEDICAL CARE IF:  You cough up bloody sputum that had not been present before. You develop fever of 102 F (38.9 C) or greater. You develop worsening pain at or near  the incision site. MAKE SURE YOU:  Understand these instructions. Will watch your condition. Will get help right away if you are not doing well or get worse. Document Released: 04/08/2007 Document Revised: 02/18/2012 Document Reviewed: 06/09/2007 [facility] Patient Information 2014 [place], [place].    WHAT IS A BLOOD TRANSFUSION? Blood Transfusion Information  A transfusion is the replacement of blood or some of its parts. Blood is made up of multiple cells which provide different functions. Red blood cells carry oxygen and are used for blood loss replacement. White blood cells fight against infection. Platelets control bleeding. Plasma helps clot blood. Other blood products are available for specialized needs, such as hemophilia or other clotting disorders. BEFORE THE TRANSFUSION  Who gives blood for transfusions?  Healthy volunteers who are fully evaluated to make sure their blood is safe. This is blood bank blood. Transfusion therapy is the safest it has ever been in the practice of medicine. Before blood is taken from a donor, a complete history is taken to make sure that person has no history of diseases nor engages in risky social behavior (examples are intravenous drug use or sexual activity with multiple partners). The donor's travel history is screened to minimize risk of transmitting infections, such as malaria. The donated blood is tested for signs of infectious diseases, such as HIV and hepatitis. The blood is then tested to be sure it is compatible with you in order to minimize the chance of a transfusion reaction. If you or a relative donates blood, this is often done in anticipation of surgery and is not appropriate for emergency situations. It takes many days to process the donated blood. RISKS AND COMPLICATIONS Although transfusion therapy is very safe and saves many lives, the main dangers of transfusion include:  Getting an  infectious disease. Developing a transfusion  reaction. This is an allergic reaction to something in the blood you were given. Every precaution is taken to prevent this. The decision to have a blood transfusion has been considered carefully by your caregiver before blood is given. Blood is not given unless the benefits outweigh the risks. AFTER THE TRANSFUSION Right after receiving a blood transfusion, you will usually feel much better and more energetic. This is especially true if your red blood cells have gotten low (anemic). The transfusion raises the level of the red blood cells which carry oxygen, and this usually causes an energy increase. The nurse administering the transfusion will monitor you carefully for complications. HOME CARE INSTRUCTIONS  No special instructions are needed after a transfusion. You may find your energy is better. Speak with your caregiver about any limitations on activity for underlying diseases you may have. SEEK MEDICAL CARE IF:  Your condition is not improving after your transfusion. You develop redness or irritation at the intravenous (IV) site. SEEK IMMEDIATE MEDICAL CARE IF:  Any of the following symptoms occur over the next 12 hours: Shaking chills. You have a temperature by mouth above 102 F (38.9 C), not controlled by medicine. Chest, back, or muscle pain. People around you feel you are not acting correctly or are confused. Shortness of breath or difficulty breathing. Dizziness and fainting. You get a rash or develop hives. You have a decrease in urine output. Your urine turns a dark color or changes to pink, red, or brown. Any of the following symptoms occur over the next 10 days: You have a temperature by mouth above 102 F (38.9 C), not controlled by medicine. Shortness of breath. Weakness after normal activity. The white part of the eye turns yellow (jaundice). You have a decrease in the amount of urine or are urinating less often. Your urine turns a dark color or changes to pink, red, or  brown. Document Released: 11/23/2000 Document Revised: 02/18/2012 Document Reviewed: 07/12/2008 [facility] Patient Information 2014 [place], [place].  _______________________________________________________________________

## 2022-10-24 ENCOUNTER — Other Ambulatory Visit: Payer: Self-pay

## 2022-10-24 ENCOUNTER — Encounter (HOSPITAL_COMMUNITY): Payer: Self-pay

## 2022-10-24 ENCOUNTER — Encounter (HOSPITAL_COMMUNITY)
Admission: RE | Admit: 2022-10-24 | Discharge: 2022-10-24 | Disposition: A | Discharge status: Home / Self Care | Payer: 59 | Source: Ambulatory Visit | Attending: Orthopedic Surgery | Admitting: Orthopedic Surgery

## 2022-10-24 VITALS — BP 129/85 | HR 66 | Temp 98.5°F | Resp 16 | Ht 62.0 in | Wt 199.0 lb

## 2022-10-24 DIAGNOSIS — Z01818 Encounter for other preprocedural examination: Secondary | ICD-10-CM | POA: Insufficient documentation

## 2022-10-24 DIAGNOSIS — I10 Essential (primary) hypertension: Secondary | ICD-10-CM | POA: Diagnosis not present

## 2022-10-24 DIAGNOSIS — G8929 Other chronic pain: Secondary | ICD-10-CM | POA: Insufficient documentation

## 2022-10-24 DIAGNOSIS — R9431 Abnormal electrocardiogram [ECG] [EKG]: Secondary | ICD-10-CM | POA: Diagnosis not present

## 2022-10-24 DIAGNOSIS — M25562 Pain in left knee: Secondary | ICD-10-CM | POA: Diagnosis not present

## 2022-10-24 LAB — COMPREHENSIVE METABOLIC PANEL
ALT: 25 U/L (ref 0–44)
AST: 26 U/L (ref 15–41)
Albumin: 3.7 g/dL (ref 3.5–5.0)
Alkaline Phosphatase: 66 U/L (ref 38–126)
Anion gap: 7 (ref 5–15)
BUN: 18 mg/dL (ref 6–20)
CO2: 24 mmol/L (ref 22–32)
Calcium: 9.5 mg/dL (ref 8.9–10.3)
Chloride: 106 mmol/L (ref 98–111)
Creatinine, Ser: 0.79 mg/dL (ref 0.44–1.00)
GFR, Estimated: >60 mL/min (ref >60)
Glucose, Bld: 100 mg/dL — ABNORMAL HIGH (ref 70–99)
Potassium: 3.8 mmol/L (ref 3.5–5.1)
Sodium: 137 mmol/L (ref 135–145)
Total Bilirubin: 0.7 mg/dL (ref 0.3–1.2)
Total Protein: 6.9 g/dL (ref 6.5–8.1)

## 2022-10-24 LAB — CBC WITH DIFFERENTIAL/PLATELET
Abs Immature Granulocytes: 0.03 10*3/uL (ref 0.00–0.07)
Basophils Absolute: 0 10*3/uL (ref 0.0–0.1)
Basophils Relative: 0 %
Eosinophils Absolute: 0 10*3/uL (ref 0.0–0.5)
Eosinophils Relative: 0 %
HCT: 42.5 % (ref 36.0–46.0)
Hemoglobin: 14.6 g/dL (ref 12.0–15.0)
Immature Granulocytes: 0 %
Lymphocytes Relative: 14 %
Lymphs Abs: 1.8 10*3/uL (ref 0.7–4.0)
MCH: 31.9 pg (ref 26.0–34.0)
MCHC: 34.4 g/dL (ref 30.0–36.0)
MCV: 93 fL (ref 80.0–100.0)
Monocytes Absolute: 0.7 10*3/uL (ref 0.1–1.0)
Monocytes Relative: 5 %
Neutro Abs: 10.2 10*3/uL — ABNORMAL HIGH (ref 1.7–7.7)
Neutrophils Relative %: 81 %
Platelets: 221 10*3/uL (ref 150–400)
RBC: 4.57 MIL/uL (ref 3.87–5.11)
RDW: 13 % (ref 11.5–15.5)
WBC: 12.8 10*3/uL — ABNORMAL HIGH (ref 4.0–10.5)
nRBC: 0 % (ref 0.0–0.2)

## 2022-10-25 DIAGNOSIS — Z96652 Presence of left artificial knee joint: Secondary | ICD-10-CM | POA: Diagnosis not present

## 2022-10-25 DIAGNOSIS — M1712 Unilateral primary osteoarthritis, left knee: Secondary | ICD-10-CM | POA: Diagnosis not present

## 2022-10-25 DIAGNOSIS — M6281 Muscle weakness (generalized): Secondary | ICD-10-CM | POA: Diagnosis not present

## 2022-10-25 LAB — SURGICAL PCR SCREEN
MRSA, PCR: POSITIVE — AB
Staphylococcus aureus: POSITIVE — AB

## 2022-10-25 NOTE — Progress Notes (Signed)
Anesthesia Chart Review   Case: [id number] Date/Time: 10/31/22 1045   Procedure: TOTAL KNEE ARTHROPLASTY (Left: Knee)   Anesthesia type: Spinal   Pre-op diagnosis: OA LEFT KNEE   Location: [name] 09 / [name]   Surgeons: Al[facility] Sheng, MD       DISCUSSION:61 y.o. never smoker with h/o HTN, left knee OA scheduled for above procedure 10/31/2022 with Dr. Charlies [name].   S/p ACDF C4-7. Prior to this procedure anesthesia chart review as outlined, "Patient history lists heart murmur.  She had echo in 2012 that was benign, normal LVEF, only AV sclerosis without stenosis.   History of TIA at 61 years old.  In 2019 she had carotid duplex prior to having ACDF that showed bilateral 1 to 39% stenosis."   TTE 02/20/2011: Study conclusions: Left ventricle: The cavity size was normal.  Wall thickness was increased in a pattern of mild LVH.  The estimated ejection fraction was 60%.  Wall motion was normal; there were no regional wall motion normalities. Aortic valve: Sclerosis without stenosis.   Pt last seen by PCP 10/08/2022. Per physical exam, no murmur noted.   VS: BP 129/85 Comment: Right arm sitting  Pulse 66   Temp 36.9 C (Oral)   Resp 16   Ht '5\' 2"'$  (1.575 m)   Wt 90.3 kg   SpO2 99%   BMI 36.40 kg/m   PROVIDERS: Alison Arnt, MD is PCP    LABS: Labs reviewed: Acceptable for surgery. (all labs ordered are listed, but only abnormal results are displayed)  Labs Reviewed  SURGICAL PCR SCREEN - Abnormal; Notable for the following components:      Result Value   MRSA, PCR POSITIVE (*)    Staphylococcus aureus POSITIVE (*)    All other components within normal limits  CBC WITH DIFFERENTIAL/PLATELET - Abnormal; Notable for the following components:   WBC 12.8 (*)    Neutro Abs 10.2 (*)    All other components within normal limits  COMPREHENSIVE METABOLIC PANEL - Abnormal; Notable for the following components:   Glucose, Bld 100 (*)    All other components within  normal limits  TYPE AND SCREEN     IMAGES:   EKG:   CV:  Past Medical History:  Diagnosis Date   Anemia    prior to hysterectomy    Anxiety    Arthritis    LEFT KNEE   Asthma    Cervical myelopathy ([name]) 08/28/2018   Family history of adverse reaction to anesthesia    Sister PONV   GERD (gastroesophageal reflux disease)    Heart murmur    Hx-TIA (transient ischemic attack) 1992   Hyperlipidemia    Hypertension    Hypothyroidism    Obesity    Pneumonia    Polycystic ovarian syndrome     Past Surgical History:  Procedure Laterality Date   ABDOMINAL HYSTERECTOMY  2009   Partial   ANTERIOR CERVICAL DECOMPRESSION/DISCECTOMY FUSION 4 LEVELS N/A 08/28/2018   Procedure: ANTERIOR CERVICAL DECOMPRESSION FUSION, CERVICAL 4-5, CERVICAL 5-6, CERVICAL 6-7 WITH INSTRUMENTATION AND ALLOGRAFT;  Surgeon: [facility] Bob, MD;  Location: [name];  Service: Orthopedics;  Laterality: N/A;   BREAST LUMPECTOMY Left 06/27/2020   Procedure: LEFT BREAST LUMPECTOMY;  Surgeon: [facility] Keens, MD;  Location: [name];  Service: General;  Laterality: Left;  LMA   DIAGNOSTIC LAPAROSCOPY     FEMUR FRACTURE SURGERY  2008   Left   FRACTURE SURGERY     I & D EXTREMITY  Left 06/01/2018   Procedure: IRRIGATION AND DEBRIDEMENT LEFT CALF;  Surgeon: [facility] Rossetti, MD;  Location: [facility];  Service: Orthopedics;  Laterality: Left;   KNEE CARTILAGE SURGERY  2005   LAPAROSCOPIC GASTRIC BAND REMOVAL WITH LAPAROSCOPIC GASTRIC SLEEVE RESECTION N/A 05/03/2020   Procedure: LAPAROSCOPIC GASTRIC BAND REMOVAL WITH LAPAROSCOPIC GASTRIC SLEEVE RESECTION; UPPER ENDO AND ERAS PATHWAY;  Surgeon: [facility] Brightly Arta Bruce, MD;  Location: [name];  Service: General;  Laterality: N/A;   LAPAROSCOPIC GASTRIC BANDING  11/12/2011   Procedure: LAPAROSCOPIC GASTRIC BANDING;  Surgeon: [facility] Keens, DO;  Location: [name];  Service: General;  Laterality: N/A;  [name] liver retractor/ endostitch available 2-0 surgidac refills/  layton needle drivers available/ [employer] Doctor to proctor   TONSILLECTOMY  2007    MEDICATIONS:  acetaminophen (TYLENOL) 500 MG tablet   albuterol (VENTOLIN HFA) 108 (90 Base) MCG/ACT inhaler   ALPRAZolam (XANAX) 0.25 MG tablet   calcium carbonate (TUMS - DOSED IN MG ELEMENTAL CALCIUM) 500 MG chewable tablet   escitalopram (LEXAPRO) 10 MG tablet   esomeprazole (NEXIUM) 40 MG capsule   famotidine (PEPCID) 40 MG tablet   hydrochlorothiazide (HYDRODIURIL) 25 MG tablet   levothyroxine (SYNTHROID) 100 MCG tablet   Multiple Vitamins-Minerals (HAIR/SKIN/NAILS) TABS   pantoprazole (PROTONIX) 40 MG tablet   rOPINIRole (REQUIP) 3 MG tablet   rosuvastatin (CRESTOR) 40 MG tablet   sucralfate (CARAFATE) 1 g tablet   traZODone (DESYREL) 50 MG tablet   No current facility-administered medications for this encounter.    Alison Felix Ward, PA-C [facility] [phone]

## 2022-10-25 NOTE — Anesthesia Preprocedure Evaluation (Addendum)
Anesthesia Evaluation  Patient identified by MRN, date of birth, ID band Patient awake    Reviewed: Allergy & Precautions, NPO status , Patient's Chart, lab work & pertinent test results  History of Anesthesia Complications Negative for: history of anesthetic complications  Airway Mallampati: II  TM Distance: >3 FB Neck ROM: Full    Dental no notable dental hx.    Pulmonary asthma    Pulmonary exam normal        Cardiovascular hypertension, Pt. on medications Normal cardiovascular exam     Neuro/Psych   Anxiety     TIA (1992)   GI/Hepatic Neg liver ROS,GERD  Medicated and Poorly Controlled,,  Endo/Other  Hypothyroidism    Renal/GU negative Renal ROS  negative genitourinary   Musculoskeletal  (+) Arthritis ,    Abdominal   Peds  Hematology negative hematology ROS (+)   Anesthesia Other Findings Day of surgery medications reviewed with patient.  Reproductive/Obstetrics negative OB ROS                              Anesthesia Physical Anesthesia Plan  ASA: 2  Anesthesia Plan: General   Post-op Pain Management: Tylenol PO (pre-op)* and Regional block*   Induction: Intravenous, Rapid sequence and Cricoid pressure planned  PONV Risk Score and Plan: 4 or greater and Treatment may vary due to age or medical condition, Ondansetron, Propofol infusion, Dexamethasone, Midazolam and Scopolamine patch - Pre-op  Airway Management Planned: Oral ETT  Additional Equipment: None  Intra-op Plan:   Post-operative Plan: Extubation in OR  Informed Consent: I have reviewed the patients History and Physical, chart, labs and discussed the procedure including the risks, benefits and alternatives for the proposed anesthesia with the patient or authorized representative who has indicated his/her understanding and acceptance.     Dental advisory given  Plan Discussed with: CRNA  Anesthesia Plan  Comments: (Patient states she has severe uncontrolled GERD after her gastric sleeve. Plan for GETA/RSI. Daiva Huge, MD)        Anesthesia Quick Evaluation

## 2022-10-25 NOTE — Progress Notes (Signed)
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 Appropriate notes have been placed on the patient's chart. This note has been routed to Dr. Zachery Dakins and Rosario Adie for review. The Patient's surgery is currently scheduled for: 10-31-2022 at [facility]  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   Leota Jacobsen, BSN, CVRN-BC   Pre-Surgical Testing Nurse [place]  [phone]

## 2022-10-28 ENCOUNTER — Ambulatory Visit: Payer: Self-pay | Admitting: Physician Assistant

## 2022-10-28 MED ORDER — VANCOMYCIN HCL 10 G IV SOLR: 1000.0000 mg | INTRAVENOUS | Status: AC

## 2022-10-29 DIAGNOSIS — M25561 Pain in right knee: Secondary | ICD-10-CM | POA: Diagnosis not present

## 2022-10-30 DIAGNOSIS — M25561 Pain in right knee: Secondary | ICD-10-CM | POA: Diagnosis not present

## 2022-10-30 MED ORDER — TRANEXAMIC ACID 1000 MG/10ML IV SOLN
2000.0000 mg | INTRAVENOUS | Status: DC
  Filled 2022-10-30: qty 20

## 2022-10-31 ENCOUNTER — Encounter (HOSPITAL_COMMUNITY): Payer: Self-pay | Admitting: Orthopedic Surgery

## 2022-10-31 ENCOUNTER — Ambulatory Visit (HOSPITAL_COMMUNITY): Payer: 59 | Admitting: Physician Assistant

## 2022-10-31 ENCOUNTER — Ambulatory Visit (HOSPITAL_COMMUNITY): Payer: 59

## 2022-10-31 ENCOUNTER — Other Ambulatory Visit: Payer: Self-pay

## 2022-10-31 ENCOUNTER — Encounter (HOSPITAL_COMMUNITY)
Admission: RE | Disposition: A | Discharge status: Home / Self Care | Payer: Self-pay | Source: Home / Self Care | Attending: Orthopedic Surgery

## 2022-10-31 ENCOUNTER — Ambulatory Visit (HOSPITAL_BASED_OUTPATIENT_CLINIC_OR_DEPARTMENT_OTHER): Payer: 59 | Admitting: Anesthesiology

## 2022-10-31 ENCOUNTER — Ambulatory Visit (HOSPITAL_COMMUNITY)
Admission: RE | Admit: 2022-10-31 | Discharge: 2022-10-31 | Disposition: A | Discharge status: Home / Self Care | Payer: 59 | Attending: Orthopedic Surgery | Admitting: Orthopedic Surgery

## 2022-10-31 DIAGNOSIS — J4520 Mild intermittent asthma, uncomplicated: Secondary | ICD-10-CM | POA: Insufficient documentation

## 2022-10-31 DIAGNOSIS — M1712 Unilateral primary osteoarthritis, left knee: Secondary | ICD-10-CM | POA: Insufficient documentation

## 2022-10-31 DIAGNOSIS — R011 Cardiac murmur, unspecified: Secondary | ICD-10-CM | POA: Insufficient documentation

## 2022-10-31 DIAGNOSIS — G8918 Other acute postprocedural pain: Secondary | ICD-10-CM | POA: Diagnosis not present

## 2022-10-31 DIAGNOSIS — I10 Essential (primary) hypertension: Secondary | ICD-10-CM | POA: Insufficient documentation

## 2022-10-31 DIAGNOSIS — E039 Hypothyroidism, unspecified: Secondary | ICD-10-CM | POA: Insufficient documentation

## 2022-10-31 DIAGNOSIS — F419 Anxiety disorder, unspecified: Secondary | ICD-10-CM | POA: Diagnosis not present

## 2022-10-31 DIAGNOSIS — Z96652 Presence of left artificial knee joint: Secondary | ICD-10-CM | POA: Diagnosis not present

## 2022-10-31 DIAGNOSIS — Z471 Aftercare following joint replacement surgery: Secondary | ICD-10-CM | POA: Diagnosis not present

## 2022-10-31 DIAGNOSIS — K219 Gastro-esophageal reflux disease without esophagitis: Secondary | ICD-10-CM | POA: Insufficient documentation

## 2022-10-31 DIAGNOSIS — R69 Illness, unspecified: Secondary | ICD-10-CM | POA: Diagnosis not present

## 2022-10-31 HISTORY — PX: TOTAL KNEE ARTHROPLASTY: SHX125

## 2022-10-31 LAB — TYPE AND SCREEN
ABO/RH(D): O POS
Antibody Screen: NEGATIVE

## 2022-10-31 SURGERY — ARTHROPLASTY, KNEE, TOTAL
Anesthesia: General | Site: Knee | Laterality: Left

## 2022-10-31 MED ORDER — PROPOFOL 10 MG/ML IV BOLUS
INTRAVENOUS | Status: DC | PRN
  Administered 2022-10-31: 20 mg via INTRAVENOUS
  Administered 2022-10-31: 180 mg via INTRAVENOUS

## 2022-10-31 MED ORDER — BUPIVACAINE LIPOSOME 1.3 % IJ SUSP
INTRAMUSCULAR | Status: DC | PRN
  Administered 2022-10-31: 20 mL

## 2022-10-31 MED ORDER — 0.9 % SODIUM CHLORIDE (POUR BTL) OPTIME
TOPICAL | Status: DC | PRN
  Administered 2022-10-31: 1000 mL

## 2022-10-31 MED ORDER — DEXAMETHASONE SODIUM PHOSPHATE 10 MG/ML IJ SOLN
8.0000 mg | Freq: Once | INTRAMUSCULAR | Status: AC
  Administered 2022-10-31: 8 mg via INTRAVENOUS

## 2022-10-31 MED ORDER — LACTATED RINGERS IV BOLUS: 250.0000 mL | Freq: Once | INTRAVENOUS | Status: DC

## 2022-10-31 MED ORDER — ONDANSETRON HCL 4 MG/2ML IJ SOLN
INTRAMUSCULAR | Status: DC | PRN
  Administered 2022-10-31: 4 mg via INTRAVENOUS

## 2022-10-31 MED ORDER — ORAL CARE MOUTH RINSE: 15.0000 mL | Freq: Once | OROMUCOSAL | Status: AC

## 2022-10-31 MED ORDER — SODIUM CHLORIDE (PF) 0.9 % IJ SOLN
INTRAMUSCULAR | Status: AC
  Filled 2022-10-31: qty 50

## 2022-10-31 MED ORDER — FENTANYL CITRATE (PF) 100 MCG/2ML IJ SOLN
INTRAMUSCULAR | Status: DC | PRN
  Administered 2022-10-31: 50 ug via INTRAVENOUS
  Administered 2022-10-31: 100 ug via INTRAVENOUS
  Administered 2022-10-31: 50 ug via INTRAVENOUS

## 2022-10-31 MED ORDER — AMISULPRIDE (ANTIEMETIC) 5 MG/2ML IV SOLN: 10.0000 mg | Freq: Once | INTRAVENOUS | Status: DC | PRN

## 2022-10-31 MED ORDER — ACETAMINOPHEN 500 MG PO TABS
1000.0000 mg | ORAL_TABLET | Freq: Once | ORAL | Status: AC
  Administered 2022-10-31: 1000 mg via ORAL
  Filled 2022-10-31: qty 2

## 2022-10-31 MED ORDER — METHOCARBAMOL 500 MG PO TABS: 500.0000 mg | ORAL_TABLET | Freq: Four times a day (QID) | ORAL | Status: DC | PRN

## 2022-10-31 MED ORDER — METHOCARBAMOL 500 MG IVPB - SIMPLE MED
INTRAVENOUS | Status: AC
  Administered 2022-10-31: 500 mg via INTRAVENOUS
  Filled 2022-10-31: qty 55

## 2022-10-31 MED ORDER — OXYCODONE HCL 5 MG PO TABS: 5.0000 mg | ORAL_TABLET | ORAL | 0 refills | Status: AC | PRN

## 2022-10-31 MED ORDER — ROCURONIUM BROMIDE 10 MG/ML (PF) SYRINGE
PREFILLED_SYRINGE | INTRAVENOUS | Status: DC | PRN
  Administered 2022-10-31: 10 mg via INTRAVENOUS
  Administered 2022-10-31: 30 mg via INTRAVENOUS
  Administered 2022-10-31: 10 mg via INTRAVENOUS

## 2022-10-31 MED ORDER — SODIUM CHLORIDE 0.9% FLUSH
INTRAVENOUS | Status: DC | PRN
  Administered 2022-10-31: 60 mL via INTRAVENOUS

## 2022-10-31 MED ORDER — PROPOFOL 1000 MG/100ML IV EMUL
INTRAVENOUS | Status: AC
  Filled 2022-10-31: qty 100

## 2022-10-31 MED ORDER — PROPOFOL 500 MG/50ML IV EMUL
INTRAVENOUS | Status: DC | PRN
  Administered 2022-10-31: 100 ug/kg/min via INTRAVENOUS

## 2022-10-31 MED ORDER — LACTATED RINGERS IV BOLUS
500.0000 mL | Freq: Once | INTRAVENOUS | Status: AC
  Administered 2022-10-31: 500 mL via INTRAVENOUS

## 2022-10-31 MED ORDER — VANCOMYCIN HCL IN DEXTROSE 1-5 GM/200ML-% IV SOLN
1000.0000 mg | INTRAVENOUS | Status: AC
  Administered 2022-10-31: 1000 mg via INTRAVENOUS
  Filled 2022-10-31: qty 200

## 2022-10-31 MED ORDER — ONDANSETRON HCL 4 MG PO TABS: 4.0000 mg | ORAL_TABLET | Freq: Three times a day (TID) | ORAL | 0 refills | Status: AC | PRN

## 2022-10-31 MED ORDER — OXYCODONE HCL 5 MG PO TABS
ORAL_TABLET | ORAL | Status: AC
  Filled 2022-10-31: qty 2

## 2022-10-31 MED ORDER — BUPIVACAINE-EPINEPHRINE (PF) 0.5% -1:200000 IJ SOLN
INTRAMUSCULAR | Status: DC | PRN
  Administered 2022-10-31: 15 mL via PERINEURAL

## 2022-10-31 MED ORDER — SUCCINYLCHOLINE CHLORIDE 200 MG/10ML IV SOSY
PREFILLED_SYRINGE | INTRAVENOUS | Status: AC
  Filled 2022-10-31: qty 10

## 2022-10-31 MED ORDER — WATER FOR IRRIGATION, STERILE IR SOLN
Status: DC | PRN
  Administered 2022-10-31: 2000 mL

## 2022-10-31 MED ORDER — METHOCARBAMOL 500 MG IVPB - SIMPLE MED: 500.0000 mg | Freq: Four times a day (QID) | INTRAVENOUS | Status: DC | PRN

## 2022-10-31 MED ORDER — SODIUM CHLORIDE 0.9 % IR SOLN
Status: DC | PRN
  Administered 2022-10-31: 3000 mL

## 2022-10-31 MED ORDER — FENTANYL CITRATE (PF) 100 MCG/2ML IJ SOLN
INTRAMUSCULAR | Status: AC
  Filled 2022-10-31: qty 2

## 2022-10-31 MED ORDER — CEFAZOLIN SODIUM-DEXTROSE 2-4 GM/100ML-% IV SOLN
2.0000 g | Freq: Once | INTRAVENOUS | Status: AC
  Administered 2022-10-31: 2 g via INTRAVENOUS
  Filled 2022-10-31: qty 100

## 2022-10-31 MED ORDER — DEXAMETHASONE SODIUM PHOSPHATE 10 MG/ML IJ SOLN
INTRAMUSCULAR | Status: AC
  Filled 2022-10-31: qty 1

## 2022-10-31 MED ORDER — OXYCODONE HCL 5 MG PO TABS
5.0000 mg | ORAL_TABLET | ORAL | Status: DC | PRN
  Administered 2022-10-31: 10 mg via ORAL

## 2022-10-31 MED ORDER — PHENYLEPHRINE HCL-NACL 20-0.9 MG/250ML-% IV SOLN
INTRAVENOUS | Status: AC
  Filled 2022-10-31: qty 250

## 2022-10-31 MED ORDER — ISOPROPYL ALCOHOL 70 % SOLN
Status: DC | PRN
  Administered 2022-10-31: 1 via TOPICAL

## 2022-10-31 MED ORDER — CEFAZOLIN SODIUM-DEXTROSE 2-4 GM/100ML-% IV SOLN
INTRAVENOUS | Status: AC
  Filled 2022-10-31: qty 100

## 2022-10-31 MED ORDER — SODIUM CHLORIDE (PF) 0.9 % IJ SOLN
INTRAMUSCULAR | Status: AC
  Filled 2022-10-31: qty 10

## 2022-10-31 MED ORDER — CHLORHEXIDINE GLUCONATE 0.12 % MT SOLN
15.0000 mL | Freq: Once | OROMUCOSAL | Status: AC
  Administered 2022-10-31: 15 mL via OROMUCOSAL

## 2022-10-31 MED ORDER — MIDAZOLAM HCL 2 MG/2ML IJ SOLN
INTRAMUSCULAR | Status: AC
  Administered 2022-10-31: 1 mg via INTRAVENOUS
  Filled 2022-10-31: qty 2

## 2022-10-31 MED ORDER — POVIDONE-IODINE 10 % EX SWAB: 2.0000 | Freq: Once | CUTANEOUS | Status: DC

## 2022-10-31 MED ORDER — FENTANYL CITRATE PF 50 MCG/ML IJ SOSY
PREFILLED_SYRINGE | INTRAMUSCULAR | Status: AC
  Administered 2022-10-31: 50 ug via INTRAVENOUS
  Filled 2022-10-31: qty 2

## 2022-10-31 MED ORDER — CLONIDINE HCL (ANALGESIA) 100 MCG/ML EP SOLN
EPIDURAL | Status: DC | PRN
  Administered 2022-10-31: 100 ug

## 2022-10-31 MED ORDER — ONDANSETRON HCL 4 MG/2ML IJ SOLN: 4.0000 mg | Freq: Four times a day (QID) | INTRAMUSCULAR | Status: DC | PRN

## 2022-10-31 MED ORDER — MIDAZOLAM HCL 2 MG/2ML IJ SOLN: 2.0000 mg | Freq: Once | INTRAMUSCULAR | Status: AC

## 2022-10-31 MED ORDER — TRANEXAMIC ACID-NACL 1000-0.7 MG/100ML-% IV SOLN
1000.0000 mg | INTRAVENOUS | Status: AC
  Administered 2022-10-31: 1000 mg via INTRAVENOUS
  Filled 2022-10-31: qty 100

## 2022-10-31 MED ORDER — APIXABAN 2.5 MG PO TABS: 2.5000 mg | ORAL_TABLET | Freq: Two times a day (BID) | ORAL | 0 refills | Status: DC

## 2022-10-31 MED ORDER — HYDROMORPHONE HCL 1 MG/ML IJ SOLN
0.2500 mg | INTRAMUSCULAR | Status: DC | PRN
  Administered 2022-10-31: 0.5 mg via INTRAVENOUS

## 2022-10-31 MED ORDER — PHENYLEPHRINE HCL (PRESSORS) 10 MG/ML IV SOLN
INTRAVENOUS | Status: DC | PRN
  Administered 2022-10-31: 160 ug via INTRAVENOUS

## 2022-10-31 MED ORDER — FENTANYL CITRATE PF 50 MCG/ML IJ SOSY: 50.0000 ug | PREFILLED_SYRINGE | Freq: Once | INTRAMUSCULAR | Status: AC

## 2022-10-31 MED ORDER — ROCURONIUM BROMIDE 10 MG/ML (PF) SYRINGE
PREFILLED_SYRINGE | INTRAVENOUS | Status: AC
  Filled 2022-10-31: qty 10

## 2022-10-31 MED ORDER — ONDANSETRON HCL 4 MG PO TABS: 4.0000 mg | ORAL_TABLET | Freq: Four times a day (QID) | ORAL | Status: DC | PRN

## 2022-10-31 MED ORDER — LIDOCAINE 2% (20 MG/ML) 5 ML SYRINGE
INTRAMUSCULAR | Status: DC | PRN
  Administered 2022-10-31: 100 mg via INTRAVENOUS

## 2022-10-31 MED ORDER — ACETAMINOPHEN 325 MG PO TABS: 325.0000 mg | ORAL_TABLET | Freq: Four times a day (QID) | ORAL | Status: DC | PRN

## 2022-10-31 MED ORDER — BUPIVACAINE LIPOSOME 1.3 % IJ SUSP
INTRAMUSCULAR | Status: AC
  Filled 2022-10-31: qty 20

## 2022-10-31 MED ORDER — ACETAMINOPHEN 500 MG PO TABS: 1000.0000 mg | ORAL_TABLET | Freq: Four times a day (QID) | ORAL | Status: DC

## 2022-10-31 MED ORDER — HYDROMORPHONE HCL 1 MG/ML IJ SOLN: 0.5000 mg | INTRAMUSCULAR | Status: DC | PRN

## 2022-10-31 MED ORDER — LACTATED RINGERS IV SOLN: INTRAVENOUS | Status: DC

## 2022-10-31 MED ORDER — ONDANSETRON HCL 4 MG/2ML IJ SOLN
INTRAMUSCULAR | Status: AC
  Filled 2022-10-31: qty 2

## 2022-10-31 MED ORDER — SUGAMMADEX SODIUM 200 MG/2ML IV SOLN
INTRAVENOUS | Status: DC | PRN
  Administered 2022-10-31: 200 mg via INTRAVENOUS

## 2022-10-31 MED ORDER — CEFAZOLIN SODIUM-DEXTROSE 2-4 GM/100ML-% IV SOLN: 2.0000 g | Freq: Four times a day (QID) | INTRAVENOUS | Status: DC

## 2022-10-31 MED ORDER — SUCCINYLCHOLINE CHLORIDE 200 MG/10ML IV SOSY
PREFILLED_SYRINGE | INTRAVENOUS | Status: DC | PRN
  Administered 2022-10-31: 120 mg via INTRAVENOUS

## 2022-10-31 MED ORDER — BUPIVACAINE LIPOSOME 1.3 % IJ SUSP: 20.0000 mL | Freq: Once | INTRAMUSCULAR | Status: DC

## 2022-10-31 MED ORDER — HYDROMORPHONE HCL 1 MG/ML IJ SOLN
INTRAMUSCULAR | Status: AC
  Administered 2022-10-31: 0.5 mg via INTRAVENOUS
  Filled 2022-10-31: qty 2

## 2022-10-31 MED ORDER — LIDOCAINE HCL (PF) 2 % IJ SOLN
INTRAMUSCULAR | Status: AC
  Filled 2022-10-31: qty 5

## 2022-10-31 MED ORDER — PHENYLEPHRINE HCL-NACL 20-0.9 MG/250ML-% IV SOLN
INTRAVENOUS | Status: DC | PRN
  Administered 2022-10-31: 50 ug/min via INTRAVENOUS

## 2022-10-31 MED ORDER — SODIUM CHLORIDE 0.9 % IV SOLN: INTRAVENOUS | Status: DC

## 2022-10-31 MED ORDER — METHOCARBAMOL 500 MG PO TABS: 500.0000 mg | ORAL_TABLET | Freq: Three times a day (TID) | ORAL | 0 refills | Status: AC | PRN

## 2022-10-31 SURGICAL SUPPLY — 64 items
ADH SKN CLS APL DERMABOND .7 (GAUZE/BANDAGES/DRESSINGS) ×1
ADH SKN CLS LQ APL DERMABOND (GAUZE/BANDAGES/DRESSINGS) ×2
APL PRP STRL LF DISP 70% ISPRP (MISCELLANEOUS) ×2
AUG MED ASF PS 4-5 C-D 10 (Joint) ×1 IMPLANT
AUGMENT MED ASF PS 4-5 C-D 10 (Joint) IMPLANT
BAG COUNTER SPONGE SURGICOUNT (BAG) IMPLANT
BAG SPNG CNTER NS LX DISP (BAG)
BLADE SAG 18X100X1.27 (BLADE) ×1 IMPLANT
BLADE SAW SAG 35X64 .89 (BLADE) ×1 IMPLANT
BNDG CMPR 5X3 CHSV STRCH STRL (GAUZE/BANDAGES/DRESSINGS) ×1
BNDG CMPR MED 10X6 ELC LF (GAUZE/BANDAGES/DRESSINGS) ×1
BNDG COHESIVE 3X5 TAN ST LF (GAUZE/BANDAGES/DRESSINGS) ×1 IMPLANT
BNDG ELASTIC 6X10 VLCR STRL LF (GAUZE/BANDAGES/DRESSINGS) ×1 IMPLANT
BOWL SMART MIX CTS (DISPOSABLE) ×1 IMPLANT
CEMENT BONE R 1X40 (Cement) IMPLANT
CHLORAPREP W/TINT 26 (MISCELLANEOUS) ×2 IMPLANT
COMP FEM CR KNEE PS STD 4 LT (Joint) ×1 IMPLANT
COMP PATELLAR 10X35 METAL (Joint) ×1 IMPLANT
COMPONENT FEM CR KN PS STD 4LT (Joint) IMPLANT
COMPONENT PATELLAR 10X35 METAL (Joint) IMPLANT
COVER SURGICAL LIGHT HANDLE (MISCELLANEOUS) ×1 IMPLANT
CUFF TOURN SGL QUICK 34 (TOURNIQUET CUFF) ×1
CUFF TRNQT CYL 34X4.125X (TOURNIQUET CUFF) ×1 IMPLANT
DERMABOND ADVANCED .7 DNX12 (GAUZE/BANDAGES/DRESSINGS) ×1 IMPLANT
DERMABOND ADVANCED .7 DNX6 (GAUZE/BANDAGES/DRESSINGS) IMPLANT
DRAPE INCISE IOBAN 85X60 (DRAPES) ×1 IMPLANT
DRAPE SHEET LG 3/4 BI-LAMINATE (DRAPES) ×1 IMPLANT
DRAPE U-SHAPE 47X51 STRL (DRAPES) ×1 IMPLANT
DRESSING AQUACEL AG SP 3.5X10 (GAUZE/BANDAGES/DRESSINGS) ×1 IMPLANT
DRSG AQUACEL AG SP 3.5X10 (GAUZE/BANDAGES/DRESSINGS) ×1
ELECT REM PT RETURN 15FT ADLT (MISCELLANEOUS) ×1 IMPLANT
GAUZE SPONGE 4X4 12PLY STRL (GAUZE/BANDAGES/DRESSINGS) ×1 IMPLANT
GLOVE BIOGEL PI IND STRL 8 (GLOVE) ×1 IMPLANT
GLOVE SURG ORTHO 8.0 STRL STRW (GLOVE) ×2 IMPLANT
GOWN STRL REUS W/ TWL XL LVL3 (GOWN DISPOSABLE) ×1 IMPLANT
GOWN STRL REUS W/TWL XL LVL3 (GOWN DISPOSABLE) ×1
HANDPIECE INTERPULSE COAX TIP (DISPOSABLE) ×1
HDLS TROCR DRIL PIN KNEE 75 (PIN) ×1
HOLDER FOLEY CATH W/STRAP (MISCELLANEOUS) ×1 IMPLANT
HOOD PEEL AWAY T7 (MISCELLANEOUS) ×3 IMPLANT
MANIFOLD NEPTUNE II (INSTRUMENTS) ×1 IMPLANT
MARKER SKIN DUAL TIP RULER LAB (MISCELLANEOUS) ×1 IMPLANT
NS IRRIG 1000ML POUR BTL (IV SOLUTION) ×1 IMPLANT
PACK TOTAL KNEE CUSTOM (KITS) ×1 IMPLANT
PIN DRILL HDLS TROCAR 75 4PK (PIN) IMPLANT
PROTECTOR NERVE ULNAR (MISCELLANEOUS) ×1 IMPLANT
SCREW HEADED 33MM KNEE (MISCELLANEOUS) IMPLANT
SET HNDPC FAN SPRY TIP SCT (DISPOSABLE) ×1 IMPLANT
SOLUTION IRRIG SURGIPHOR (IV SOLUTION) IMPLANT
SPIKE FLUID TRANSFER (MISCELLANEOUS) ×1 IMPLANT
STEM TIB PS KNEE D 0D LT (Stem) IMPLANT
STRIP CLOSURE SKIN 1/2X4 (GAUZE/BANDAGES/DRESSINGS) ×1 IMPLANT
SUT MNCRL AB 3-0 PS2 18 (SUTURE) ×1 IMPLANT
SUT STRATAFIX 0 PDS 27 VIOLET (SUTURE) ×1
SUT STRATAFIX PDO 1 14 VIOLET (SUTURE) ×1
SUT STRATFX PDO 1 14 VIOLET (SUTURE) ×1
SUT VIC AB 2-0 CT2 27 (SUTURE) ×2 IMPLANT
SUTURE STRATFX 0 PDS 27 VIOLET (SUTURE) ×1 IMPLANT
SUTURE STRATFX PDO 1 14 VIOLET (SUTURE) ×1 IMPLANT
SYR 50ML LL SCALE MARK (SYRINGE) ×1 IMPLANT
TRAY FOLEY MTR SLVR 14FR STAT (SET/KITS/TRAYS/PACK) IMPLANT
TUBE SUCTION HIGH CAP CLEAR NV (SUCTIONS) ×1 IMPLANT
UNDERPAD 30X36 HEAVY ABSORB (UNDERPADS AND DIAPERS) ×1 IMPLANT
WRAP KNEE MAXI GEL POST OP (GAUZE/BANDAGES/DRESSINGS) IMPLANT

## 2022-10-31 NOTE — Transfer of Care (Signed)
Immediate Anesthesia Transfer of Care Note  Patient: [name]  Procedure(s) Performed: TOTAL KNEE ARTHROPLASTY (Left: Knee)  Patient Location: PACU  Anesthesia Type:General  Level of Consciousness: sedated  Airway & Oxygen Therapy: Patient Spontanous Breathing and Patient connected to face mask oxygen  Post-op Assessment: Report given to RN and Post -op Vital signs reviewed and stable  Post vital signs: Reviewed and stable  Last Vitals:  Vitals Value Taken Time  BP 141/96 10/31/22 1226  Temp    Pulse 59 10/31/22 1227  Resp 19 10/31/22 1227  SpO2 100 % 10/31/22 1227  Vitals shown include unvalidated device data.  Last Pain:  Vitals:   10/31/22 0854  TempSrc:   PainSc: 5       Patients Stated Pain Goal: 2 (72/07/21 8288)  Complications: No notable events documented.

## 2022-10-31 NOTE — Anesthesia Procedure Notes (Signed)
Procedure Name: Intubation Date/Time: 10/31/2022 10:15 AM  Performed by: Lind Covert, CRNAPre-anesthesia Checklist: Patient identified, Emergency Drugs available, Suction available, Patient being monitored and Timeout performed Patient Re-evaluated:Patient Re-evaluated prior to induction Oxygen Delivery Method: Circle system utilized Preoxygenation: Pre-oxygenation with 100% oxygen Induction Type: IV induction, Rapid sequence and Cricoid Pressure applied Laryngoscope Size: Mac and 4 Grade View: Grade I Tube type: Oral Tube size: 7.0 mm Number of attempts: 1 Airway Equipment and Method: Stylet Placement Confirmation: ETT inserted through vocal cords under direct vision, positive ETCO2 and breath sounds checked- equal and bilateral Secured at: 21 cm Tube secured with: Tape Dental Injury: Teeth and Oropharynx as per pre-operative assessment

## 2022-10-31 NOTE — Op Note (Signed)
DATE OF SURGERY:  10/31/2022 TIME: 12:02 PM  PATIENT NAME:  [name]   AGE: 61 y.o.    PRE-OPERATIVE DIAGNOSIS: End-stage left knee osteoarthritis  POST-OPERATIVE DIAGNOSIS:  Same  PROCEDURE: Left total Knee Arthroplasty  SURGEON:  Ianmichael Amescua A Sasuke Yaffe, MD   ASSISTANT: Izola Price, RNFA, present and scrubbed throughout the case, critical for assistance with exposure, retraction, instrumentation, and closure.   OPERATIVE IMPLANTS:  Press-fit Zimmer persona size 4 left tibia porous plasma spray coated, D tibia Osseo Ti press-fit, NexGen 35 mm trabecular metal press-fit patella, 10 mm MC polyethylene liner Implant Name Type Inv. Item Serial No. Manufacturer Lot No. LRB No. Used Action  STEM TIB PS KNEE D 0D LT - [id number] Stem STEM TIB PS KNEE D 0D LT  ZIMMER RECON(ORTH,TRAU,BIO,SG) [id number] Left 1 Implanted  COMP PATELLAR 10X35 METAL - [id number] Joint COMP PATELLAR 10X35 METAL  ZIMMER RECON(ORTH,TRAU,BIO,SG) [id number] Left 1 Implanted  Persona Cruciate Retaining CR SZ 4 Standard Femur    ZIMMER KNEE [id number] Left 1 Implanted  AUG MED ASF PS 4-5 C-D 10 - [id number] Joint AUG MED ASF PS 4-5 C-D 10  ZIMMER RECON(ORTH,TRAU,BIO,SG) [id number] Left 1 Implanted      PREOPERATIVE INDICATIONS:  [name] is a 61 y.o. year old female with end stage bone on bone degenerative arthritis of the knee who failed conservative treatment, including injections, antiinflammatories, activity modification, and assistive devices, and had significant impairment of their activities of daily living, and elected for Total Knee Arthroplasty.   The risks, benefits, and alternatives were discussed at length including but not limited to the risks of infection, bleeding, nerve injury, stiffness, blood clots, the need for revision surgery, cardiopulmonary complications, among others, and they were willing to proceed.  ESTIMATED BLOOD LOSS: 25cc  OPERATIVE DESCRIPTION:   Once adequate anesthesia was induced,  preoperative antibiotics, 2 gm of ancef,1 gm of Tranexamic Acid, and 8 mg of Decadron administered, the patient was positioned supine with a left thigh tourniquet placed.  The left lower extremity was prepped and draped in sterile fashion.  A time-  out was performed identifying the patient, planned procedure, and the appropriate extremity.     The leg was  exsanguinated, tourniquet elevated to 250 mmHg.  A midline incision was  made followed by median parapatellar arthrotomy. Anterior horn of the medial meniscus was released and resected. A medial release was performed, the infrapatellar fat pad was resected with care taken to protect the patellar tendon. The suprapatellar fat was removed to exposed the distal anterior femur. The anterior horn of the lateral meniscus and ACL were released.    Following initial  exposure, I first started with the femur  The femoral  canal was opened with a drill, canal was suctioned to try to prevent fat emboli.  An  intramedullary rod was passed set at 5 degrees valgus, 10 mm. The distal femur was resected.  Following this resection, the tibia was  subluxated anteriorly.  Using the extramedullary guide, 10 mm of bone was resected off   the unworn proximal anterior tibial cartilage.  We confirmed the gap would be  stable medially and laterally with a size 22m spacer block as well as confirmed that the tibial cut was perpendicular in the coronal plane, checking with an alignment rod.    Once this was done, the posterior femoral referencing femoral sizer was placed under to the posterior condyles with 5 degrees of external rotational which was parallel to the transepicondylar axis and  perpendicular to [employer]. The femur was sized to be a size 4 in the anterior-  posterior dimension. The  anterior, posterior, and  chamfer cuts were made without difficulty nor   notching making certain that I was along the anterior cortex to help  with flexion gap stability. Next a  laminar spreader was placed with the knee in flexion and the medial lateral menisci were resected.  5 cc of the Exparel mixture was injected in the medial side of the back of the knee and 3 cc in the lateral side.  1/2 inch curved osteotome was used to resect posterior osteophyte that was then removed with a pituitary rongeur.       At this point, the tibia was sized to be a size D.  The size D tray was  then pinned in position. Trial reduction was now carried with a 4 femur, D tibia, a 10 mm MC insert.  The knee had full extension and was stable to varus valgus stress in extension.  The knee was slightly tight in flexion and the PCL was partially released.   Attention was next directed to the patella.  Precut  measurement was noted to be 24 mm.  I resected down to 15 mm and used a  45m patellar button to restore patellar height as well as cover the cut surface.     The patella lug holes were drilled and a 35 mm patella poly trial was placed.    The knee was brought to full extension with good flexion stability with the patella tracking through the trochlea without application of pressure.     Next the femoral component was again assessed and determined to be seated and appropriately lateralized.  The femoral lug holes were drilled.  The femoral component was then removed. Tibial component was again assessed and felt to be seated and appropriately rotated with the medial third of the tubercle. The tibia was then drilled, and keel punched.    Given excellent bone quality and patient's young age, elected for press-fit fixation    Final implants were then press-fit onto cleaned and dried cut surfaces of bone with the knee brought to extension with a 10 mm MC poly.  The knee was irrigated with sterile Betadine diluted in saline as well as pulse lavage normal saline.  The synovial lining was  then injected a dilute Exparel. I confirmed that I was satisfied with the range of motion and stability, and the  final 10 mm MC poly insert was chosen.  It was placed into the knee.         The tourniquet had been let down.  No significant hemostasis was required.  The medial parapatellar arthrotomy was then reapproximated using #1 Stratafix sutures with the knee  in flexion.  The remaining wound was closed with 0 stratafix, 2-0 Vicryl, and running 3-0 Monocryl. The knee was cleaned, dried, dressed sterilely using Dermabond and   Aquacel dressing.  The patient was then brought to recovery room in stable condition, tolerating the procedure  well. There were no complications.   Post op recs: WB: WBAT Abx: ancef Imaging: PACU xrays DVT prophylaxis: Eliquis 2.5 mg twice daily given hx of anaphylaxis with aspirin Follow up: 2 weeks after surgery [facility] wound [facility]  [address] [address] Office Phone: [phone] DCharlies Constable MD Orthopaedic Surgery

## 2022-10-31 NOTE — Discharge Instructions (Signed)
INSTRUCTIONS AFTER JOINT REPLACEMENT   Remove items at home which could result in a fall. This includes throw rugs or furniture in walking pathways ICE to the affected joint every three hours while awake for 30 minutes at a time, for at least the first 3-5 days, and then as needed for pain and swelling.  Continue to use ice for pain and swelling. You may notice swelling that will progress down to the foot and ankle.  This is normal after surgery.  Elevate your leg when you are not up walking on it.   Continue to use the breathing machine you got in the hospital (incentive spirometer) which will help keep your temperature down.  It is common for your temperature to cycle up and down following surgery, especially at night when you are not up moving around and exerting yourself.  The breathing machine keeps your lungs expanded and your temperature down.   DIET:  As you were doing prior to hospitalization, we recommend a well-balanced diet.  DRESSING / WOUND CARE / SHOWERING  Keep the surgical dressing until follow up.  The dressing is water proof, so you can shower without any extra covering.  IF THE DRESSING FALLS OFF or the wound gets wet inside, change the dressing with sterile gauze.  Please use good hand washing techniques before changing the dressing.  Do not use any lotions or creams on the incision until instructed by your surgeon.    ACTIVITY  Increase activity slowly as tolerated, but follow the weight bearing instructions below.   No driving for 6 weeks or until further direction given by your physician.  You cannot drive while taking narcotics.  No lifting or carrying greater than 10 lbs. until further directed by your surgeon. Avoid periods of inactivity such as sitting longer than an hour when not asleep. This helps prevent blood clots.  You may return to work once you are authorized by your doctor.     WEIGHT BEARING   Weight bearing as tolerated with assist device (walker, cane,  etc) as directed, use it as long as suggested by your surgeon or therapist, typically at least 4-6 weeks.   EXERCISES  Results after joint replacement surgery are often greatly improved when you follow the exercise, range of motion and muscle strengthening exercises prescribed by your doctor. Safety measures are also important to protect the joint from further injury. Any time any of these exercises cause you to have increased pain or swelling, decrease what you are doing until you are comfortable again and then slowly increase them. If you have problems or questions, call your caregiver or physical therapist for advice.   Rehabilitation is important following a joint replacement. After just a few days of immobilization, the muscles of the leg can become weakened and shrink (atrophy).  These exercises are designed to build up the tone and strength of the thigh and leg muscles and to improve motion. Often times heat used for twenty to thirty minutes before working out will loosen up your tissues and help with improving the range of motion but do not use heat for the first two weeks following surgery (sometimes heat can increase post-operative swelling).   These exercises can be done on a training (exercise) mat, on the floor, on a table or on a bed. Use whatever works the best and is most comfortable for you.    Use music or television while you are exercising so that the exercises are a pleasant break in your   day. This will make your life better with the exercises acting as a break in your routine that you can look forward to.   Perform all exercises about fifteen times, three times per day or as directed.  You should exercise both the operative leg and the other leg as well.  Exercises include:   Quad Sets - Tighten up the muscle on the front of the thigh (Quad) and hold for 5-10 seconds.   Straight Leg Raises - With your knee straight (if you were given a brace, keep it on), lift the leg to 60  degrees, hold for 3 seconds, and slowly lower the leg.  Perform this exercise against resistance later as your leg gets stronger.  Leg Slides: Lying on your back, slowly slide your foot toward your buttocks, bending your knee up off the floor (only go as far as is comfortable). Then slowly slide your foot back down until your leg is flat on the floor again.  Angel Wings: Lying on your back spread your legs to the side as far apart as you can without causing discomfort.  Hamstring Strength:  Lying on your back, push your heel against the floor with your leg straight by tightening up the muscles of your buttocks.  Repeat, but this time bend your knee to a comfortable angle, and push your heel against the floor.  You may put a pillow under the heel to make it more comfortable if necessary.   A rehabilitation program following joint replacement surgery can speed recovery and prevent re-injury in the future due to weakened muscles. Contact your doctor or a physical therapist for more information on knee rehabilitation.    CONSTIPATION  Constipation is defined medically as fewer than three stools per week and severe constipation as less than one stool per week.  Even if you have a regular bowel pattern at home, your normal regimen is likely to be disrupted due to multiple reasons following surgery.  Combination of anesthesia, postoperative narcotics, change in appetite and fluid intake all can affect your bowels.   YOU MUST use at least one of the following options; they are listed in order of increasing strength to get the job done.  They are all available over the counter, and you may need to use some, POSSIBLY even all of these options:    Drink plenty of fluids (prune juice may be helpful) and high fiber foods Colace 100 mg by mouth twice a day  Senokot for constipation as directed and as needed Dulcolax (bisacodyl), take with full glass of water  Miralax (polyethylene glycol) once or twice a day as  needed.  If you have tried all these things and are unable to have a bowel movement in the first 3-4 days after surgery call either your surgeon or your primary doctor.    If you experience loose stools or diarrhea, hold the medications until you stool forms back up.  If your symptoms do not get better within 1 week or if they get worse, check with your doctor.  If you experience "the worst abdominal pain ever" or develop nausea or vomiting, please contact the office immediately for further recommendations for treatment.   ITCHING:  If you experience itching with your medications, try taking only a single pain pill, or even half a pain pill at a time.  You can also use Benadryl over the counter for itching or also to help with sleep.   TED HOSE STOCKINGS:  Use stockings on both   legs until for at least 2 weeks or as directed by physician office. They may be removed at night for sleeping.  MEDICATIONS:  See your medication summary on the "After Visit Summary" that nursing will review with you.  You may have some home medications which will be placed on hold until you complete the course of blood thinner medication.  It is important for you to complete the blood thinner medication as prescribed.   Blood clot prevention (DVT Prophylaxis): After surgery you are at an increased risk for a blood clot. you were prescribed a blood thinner, Eliquis, to be taken twice daily for a total of 4 weeks from surgery to help reduce your risk of getting a blood clot. This will help prevent a blood clot. Signs of a pulmonary embolus (blood clot in the lungs) include sudden short of breath, feeling lightheaded or dizzy, chest pain with a deep breath, rapid pulse rapid breathing. Signs of a blood clot in your arms or legs include new unexplained swelling and cramping, warm, red or darkened skin around the painful area. Please call the office or 911 right away if these signs or symptoms develop.  PRECAUTIONS:  If you  experience chest pain or shortness of breath - call 911 immediately for transfer to the hospital emergency department.   If you develop a fever greater that 101 F, purulent drainage from wound, increased redness or drainage from wound, foul odor from the wound/dressing, or calf pain - CONTACT YOUR SURGEON.                                                   FOLLOW-UP APPOINTMENTS:  If you do not already have a post-op appointment, please call the office for an appointment to be seen by your surgeon.  Guidelines for how soon to be seen are listed in your "After Visit Summary", but are typically between 2-3 weeks after surgery.  OTHER INSTRUCTIONS:   Knee Replacement:  Do not place pillow under knee, focus on keeping the knee straight while resting. DO NOT modify, tear, cut, or change the foam block in any way.  POST-OPERATIVE OPIOID TAPER INSTRUCTIONS: It is important to wean off of your opioid medication as soon as possible. If you do not need pain medication after your surgery it is ok to stop day one. Opioids include: Codeine, Hydrocodone(Norco, Vicodin), Oxycodone(Percocet, oxycontin) and hydromorphone amongst others.  Long term and even short term use of opiods can cause: Increased pain response Dependence Constipation Depression Respiratory depression And more.  Withdrawal symptoms can include Flu like symptoms Nausea, vomiting And more Techniques to manage these symptoms Hydrate well Eat regular healthy meals Stay active Use relaxation techniques(deep breathing, meditating, yoga) Do Not substitute Alcohol to help with tapering If you have been on opioids for less than two weeks and do not have pain than it is ok to stop all together.  Plan to wean off of opioids This plan should start within one week post op of your joint replacement. Maintain the same interval or time between taking each dose and first decrease the dose.  Cut the total daily intake of opioids by one tablet  each day Next start to increase the time between doses. The last dose that should be eliminated is the evening dose.   MAKE SURE YOU:  Understand these instructions.  Get  help right away if you are not doing well or get worse.    Thank you for letting us be a part of your medical care team.  It is a privilege we respect greatly.  We hope these instructions will help you stay on track for a fast and full recovery!

## 2022-10-31 NOTE — Anesthesia Postprocedure Evaluation (Signed)
Anesthesia Post Note  Patient: Alison [name]  Procedure(s) Performed: TOTAL KNEE ARTHROPLASTY (Left: Knee)     Patient location during evaluation: PACU Anesthesia Type: General Level of consciousness: awake and alert Pain management: pain level controlled Vital Signs Assessment: post-procedure vital signs reviewed and stable Respiratory status: spontaneous breathing, nonlabored ventilation and respiratory function stable Cardiovascular status: blood pressure returned to baseline Postop Assessment: no apparent nausea or vomiting Anesthetic complications: no   No notable events documented.  Last Vitals:  Vitals:   10/31/22 1405 10/31/22 1414  BP: (!) 152/91 136/82  Pulse: (!) 47 (!) 46  Resp: 18 18  Temp: 36.5 C 36.5 C  SpO2: 94% 95%    Last Pain:  Vitals:   10/31/22 1414  TempSrc:   PainSc: [place]

## 2022-10-31 NOTE — Anesthesia Procedure Notes (Signed)
Anesthesia Regional Block: Adductor canal block   Pre-Anesthetic Checklist: , timeout performed,  Correct Patient, Correct Site, Correct Laterality,  Correct Procedure, Correct Position, site marked,  Risks and benefits discussed,  Pre-op evaluation,  At surgeon's request and post-op pain management  Laterality: Left  Prep: Maximum Sterile Barrier Precautions used, chloraprep       Needles:  Injection technique: Single-shot  Needle Type: Echogenic Stimulator Needle     Needle Length: 9cm  Needle Gauge: 22     Additional Needles:   Procedures:,,,, ultrasound used (permanent image in chart),,    Narrative:  Start time: 10/31/2022 9:47 AM End time: 10/31/2022 9:50 AM Injection made incrementally with aspirations every 5 mL.  Performed by: Personally  Anesthesiologist: Brennan Bailey, MD  Additional Notes: Risks, benefits, and alternative discussed. Patient gave consent for procedure. Patient prepped and draped in sterile fashion. Sedation administered, patient remains easily responsive to voice. Relevant anatomy identified with ultrasound guidance. Local anesthetic given in 5cc increments with no signs or symptoms of intravascular injection. No pain or paraesthesias with injection. Patient monitored throughout procedure with signs of LAST or immediate complications. Tolerated well. Ultrasound image placed in chart.  Tawny Asal, MD

## 2022-10-31 NOTE — Evaluation (Signed)
Physical Therapy Evaluation Patient Details Name: Alison [name] MRN: [id number] DOB: 06/09/1961 Today's Date: 10/31/2022  History of Present Illness  61 yo female S/P L TKA on 10/31/22. [name] PMH: ACDF, cervical myelopathy, anemis, TIA,         L femur fx  Clinical Impression   Patient is very drowsy, able to stay aroused to participate in mobility. BP 136/82 supine, 83/59 sitting, then 128/65 after ambulating. Patient able to ambulate and negotiate steps. Improved arousal and able to reach PT goals for DC. Son  states he feels comfortable,  instructed them that he should be with her until more aroused.        Recommendations for follow up therapy are one component of a multi-disciplinary discharge planning process, led by the attending physician.  Recommendations may be updated based on patient status, additional functional criteria and insurance authorization.  Follow Up Recommendations Follow physician's recommendations for discharge plan and follow up therapies      Assistance Recommended at Discharge Frequent or constant Supervision/Assistance  Patient can return home with the following  A little help with walking and/or transfers;A little help with bathing/dressing/bathroom;Assistance with cooking/housework;Assist for transportation;Help with stairs or ramp for entrance    Equipment Recommendations Rolling walker (2 wheels)  Recommendations for Other Services       Functional Status Assessment Patient has had a recent decline in their functional status and demonstrates the ability to make significant improvements in function in a reasonable and predictable amount of time.     Precautions / Restrictions Precautions Precautions: Fall Precaution Comments: very drowsy, low BP initially      Mobility  Bed Mobility Overal bed mobility: Needs Assistance Bed Mobility: Supine to Sit     Supine to sit: Min guard          Transfers Overall transfer level: Needs  assistance Equipment used: Rolling walker (2 wheels) Transfers: Sit to/from Stand Sit to Stand: Min assist           General transfer comment: close guard due to  pt. height and  stretcher height    Ambulation/Gait Ambulation/Gait assistance: Min assist Gait Distance (Feet): 50 Feet (then 80, then 20) Assistive device: Rolling walker (2 wheels) Gait Pattern/deviations: Step-to pattern, Step-through pattern Gait velocity: decr     General Gait Details: cues for safety and sequence and drowsy  Stairs Stairs: Yes Stairs assistance: Min assist Stair Management: No rails, Step to pattern, Forwards Number of Stairs: 1 General stair comments: son present for instruction  Wheelchair Mobility    Modified Rankin (Stroke Patients Only)       Balance Overall balance assessment: Mild deficits observed, not formally tested                                           Pertinent Vitals/Pain Pain Assessment Pain Assessment: 0-10 Pain Score: 5  Pain Location: left knee Pain Descriptors / Indicators: Aching Pain Intervention(s): Monitored during session, Premedicated before session    Home Living Family/patient expects to be discharged to:: Private residence Living Arrangements: Children Available Help at Discharge: Family;Available 24 hours/day Type of Home: House Home Access: Stairs to enter   Entrance Stairs-Number of Steps: 1 Alternate Level Stairs-Number of Steps: flight, has stair glide up, not on last 4 steps at top Home Layout: Two level;Able to live on main level with bedroom/bathroom Home Equipment: Conservation officer, nature (  2 wheels);BSC/3in1;Cane - single point      Prior Function Prior Level of Function : Independent/Modified Independent                     Hand Dominance        Extremity/Trunk Assessment   Upper Extremity Assessment Upper Extremity Assessment: Overall WFL for tasks assessed    Lower Extremity Assessment Lower  Extremity Assessment: LLE deficits/detail LLE Deficits / Details: + SLR, Knee flex 70       Communication   Communication: No difficulties  Cognition Arousal/Alertness: Lethargic, Suspect due to medications Behavior During Therapy: WFL for tasks assessed/performed Overall Cognitive Status: Within Functional Limits for tasks assessed                                 General Comments: gradually improved in alertness, able to participae in all  mobility        General Comments      Exercises Total Joint Exercises Ankle Circles/Pumps: AROM, Both, 10 reps Quad Sets: AROM, Left, 10 reps, Supine Straight Leg Raises: AROM, 10 reps, Supine Long Arc Quad: AROM, Left, 10 reps, Seated   Assessment/Plan    PT Assessment All further PT needs can be met in the next venue of care  PT Problem List Decreased strength;Decreased mobility;Decreased range of motion;Decreased knowledge of precautions;Decreased activity tolerance;Pain;Decreased knowledge of use of DME;Cardiopulmonary status limiting activity       PT Treatment Interventions      PT Goals (Current goals can be found in the Care Plan section)  Acute Rehab PT Goals PT Goal Formulation: All assessment and education complete, DC therapy    Frequency       Co-evaluation               AM-PAC PT "6 Clicks" Mobility  Outcome Measure Help needed turning from your back to your side while in a flat bed without using bedrails?: A Little Help needed moving from lying on your back to sitting on the side of a flat bed without using bedrails?: A Little Help needed moving to and from a bed to a chair (including a wheelchair)?: A Little Help needed standing up from a chair using your arms (e.g., wheelchair or bedside chair)?: A Little Help needed to walk in hospital room?: A Little Help needed climbing 3-5 steps with a railing? : A Little 6 Click Score: 18    End of Session Equipment Utilized During Treatment: Gait  belt Activity Tolerance: Patient tolerated treatment well Patient left: in chair;with call bell/phone within reach;with family/visitor present Nurse Communication: Mobility status PT Visit Diagnosis: Unsteadiness on feet (R26.81);Muscle weakness (generalized) (M62.81);Pain Pain - Right/Left: Left Pain - part of body: Knee    Time: 1515-1600 PT Time Calculation (min) (ACUTE ONLY): 45 min   Charges:   PT Evaluation $PT Eval Low Complexity: 1 Low PT Treatments $Gait Training: 8-22 mins $Self Care/Home Management: [place] Office [phone] Weekend [phone]   Claretha Cooper 10/31/2022, 4:36 PM

## 2022-10-31 NOTE — Interval H&P Note (Signed)

## 2022-10-31 NOTE — Progress Notes (Signed)
Orthopedic Tech Progress Note Patient Details:  Alison [name] 1961-05-07 [id number]  Patient ID: Alison [name], female   DOB: 08-28-1961, 61 y.o.   MRN: [id number]  Alison [name] 10/31/2022, 1:22 PM Bone foam applied to left leg in pacu

## 2022-11-02 DIAGNOSIS — M1711 Unilateral primary osteoarthritis, right knee: Secondary | ICD-10-CM | POA: Diagnosis not present

## 2022-11-02 DIAGNOSIS — R69 Illness, unspecified: Secondary | ICD-10-CM | POA: Diagnosis not present

## 2022-11-02 DIAGNOSIS — I10 Essential (primary) hypertension: Secondary | ICD-10-CM | POA: Diagnosis not present

## 2022-11-02 DIAGNOSIS — G959 Disease of spinal cord, unspecified: Secondary | ICD-10-CM | POA: Diagnosis not present

## 2022-11-02 DIAGNOSIS — E282 Polycystic ovarian syndrome: Secondary | ICD-10-CM | POA: Diagnosis not present

## 2022-11-02 DIAGNOSIS — Z471 Aftercare following joint replacement surgery: Secondary | ICD-10-CM | POA: Diagnosis not present

## 2022-11-02 DIAGNOSIS — G2581 Restless legs syndrome: Secondary | ICD-10-CM | POA: Diagnosis not present

## 2022-11-02 DIAGNOSIS — E890 Postprocedural hypothyroidism: Secondary | ICD-10-CM | POA: Diagnosis not present

## 2022-11-02 DIAGNOSIS — E669 Obesity, unspecified: Secondary | ICD-10-CM | POA: Diagnosis not present

## 2022-11-02 DIAGNOSIS — K219 Gastro-esophageal reflux disease without esophagitis: Secondary | ICD-10-CM | POA: Diagnosis not present

## 2022-11-02 DIAGNOSIS — G4700 Insomnia, unspecified: Secondary | ICD-10-CM | POA: Diagnosis not present

## 2022-11-02 DIAGNOSIS — J4520 Mild intermittent asthma, uncomplicated: Secondary | ICD-10-CM | POA: Diagnosis not present

## 2022-11-05 ENCOUNTER — Encounter (HOSPITAL_COMMUNITY): Payer: Self-pay | Admitting: Orthopedic Surgery

## 2022-11-05 DIAGNOSIS — R69 Illness, unspecified: Secondary | ICD-10-CM | POA: Diagnosis not present

## 2022-11-05 DIAGNOSIS — E282 Polycystic ovarian syndrome: Secondary | ICD-10-CM | POA: Diagnosis not present

## 2022-11-05 DIAGNOSIS — G4700 Insomnia, unspecified: Secondary | ICD-10-CM | POA: Diagnosis not present

## 2022-11-05 DIAGNOSIS — K219 Gastro-esophageal reflux disease without esophagitis: Secondary | ICD-10-CM | POA: Diagnosis not present

## 2022-11-05 DIAGNOSIS — E890 Postprocedural hypothyroidism: Secondary | ICD-10-CM | POA: Diagnosis not present

## 2022-11-05 DIAGNOSIS — E669 Obesity, unspecified: Secondary | ICD-10-CM | POA: Diagnosis not present

## 2022-11-05 DIAGNOSIS — I10 Essential (primary) hypertension: Secondary | ICD-10-CM | POA: Diagnosis not present

## 2022-11-05 DIAGNOSIS — M1711 Unilateral primary osteoarthritis, right knee: Secondary | ICD-10-CM | POA: Diagnosis not present

## 2022-11-05 DIAGNOSIS — G2581 Restless legs syndrome: Secondary | ICD-10-CM | POA: Diagnosis not present

## 2022-11-05 DIAGNOSIS — G959 Disease of spinal cord, unspecified: Secondary | ICD-10-CM | POA: Diagnosis not present

## 2022-11-05 DIAGNOSIS — J4520 Mild intermittent asthma, uncomplicated: Secondary | ICD-10-CM | POA: Diagnosis not present

## 2022-11-05 DIAGNOSIS — Z471 Aftercare following joint replacement surgery: Secondary | ICD-10-CM | POA: Diagnosis not present

## 2022-11-06 DIAGNOSIS — G2581 Restless legs syndrome: Secondary | ICD-10-CM | POA: Diagnosis not present

## 2022-11-06 DIAGNOSIS — K219 Gastro-esophageal reflux disease without esophagitis: Secondary | ICD-10-CM | POA: Diagnosis not present

## 2022-11-06 DIAGNOSIS — Z471 Aftercare following joint replacement surgery: Secondary | ICD-10-CM | POA: Diagnosis not present

## 2022-11-06 DIAGNOSIS — M1711 Unilateral primary osteoarthritis, right knee: Secondary | ICD-10-CM | POA: Diagnosis not present

## 2022-11-06 DIAGNOSIS — R69 Illness, unspecified: Secondary | ICD-10-CM | POA: Diagnosis not present

## 2022-11-06 DIAGNOSIS — E282 Polycystic ovarian syndrome: Secondary | ICD-10-CM | POA: Diagnosis not present

## 2022-11-06 DIAGNOSIS — G959 Disease of spinal cord, unspecified: Secondary | ICD-10-CM | POA: Diagnosis not present

## 2022-11-06 DIAGNOSIS — E669 Obesity, unspecified: Secondary | ICD-10-CM | POA: Diagnosis not present

## 2022-11-06 DIAGNOSIS — I10 Essential (primary) hypertension: Secondary | ICD-10-CM | POA: Diagnosis not present

## 2022-11-06 DIAGNOSIS — E890 Postprocedural hypothyroidism: Secondary | ICD-10-CM | POA: Diagnosis not present

## 2022-11-06 DIAGNOSIS — J4520 Mild intermittent asthma, uncomplicated: Secondary | ICD-10-CM | POA: Diagnosis not present

## 2022-11-06 DIAGNOSIS — G4700 Insomnia, unspecified: Secondary | ICD-10-CM | POA: Diagnosis not present

## 2022-11-09 DIAGNOSIS — Z96652 Presence of left artificial knee joint: Secondary | ICD-10-CM | POA: Diagnosis not present

## 2022-11-09 DIAGNOSIS — M6281 Muscle weakness (generalized): Secondary | ICD-10-CM | POA: Diagnosis not present

## 2022-11-09 DIAGNOSIS — M1712 Unilateral primary osteoarthritis, left knee: Secondary | ICD-10-CM | POA: Diagnosis not present

## 2022-11-12 DIAGNOSIS — M1712 Unilateral primary osteoarthritis, left knee: Secondary | ICD-10-CM | POA: Diagnosis not present

## 2022-11-12 DIAGNOSIS — M6281 Muscle weakness (generalized): Secondary | ICD-10-CM | POA: Diagnosis not present

## 2022-11-12 DIAGNOSIS — Z96652 Presence of left artificial knee joint: Secondary | ICD-10-CM | POA: Diagnosis not present

## 2022-11-13 ENCOUNTER — Other Ambulatory Visit: Payer: Self-pay | Admitting: Family Medicine

## 2022-11-14 DIAGNOSIS — M6281 Muscle weakness (generalized): Secondary | ICD-10-CM | POA: Diagnosis not present

## 2022-11-14 DIAGNOSIS — M1712 Unilateral primary osteoarthritis, left knee: Secondary | ICD-10-CM | POA: Diagnosis not present

## 2022-11-14 DIAGNOSIS — Z96652 Presence of left artificial knee joint: Secondary | ICD-10-CM | POA: Diagnosis not present

## 2022-11-19 DIAGNOSIS — M6281 Muscle weakness (generalized): Secondary | ICD-10-CM | POA: Diagnosis not present

## 2022-11-19 DIAGNOSIS — Z96652 Presence of left artificial knee joint: Secondary | ICD-10-CM | POA: Diagnosis not present

## 2022-11-19 DIAGNOSIS — M1712 Unilateral primary osteoarthritis, left knee: Secondary | ICD-10-CM | POA: Diagnosis not present

## 2022-11-22 DIAGNOSIS — M6281 Muscle weakness (generalized): Secondary | ICD-10-CM | POA: Diagnosis not present

## 2022-11-22 DIAGNOSIS — M1712 Unilateral primary osteoarthritis, left knee: Secondary | ICD-10-CM | POA: Diagnosis not present

## 2022-11-22 DIAGNOSIS — Z96652 Presence of left artificial knee joint: Secondary | ICD-10-CM | POA: Diagnosis not present

## 2022-11-26 DIAGNOSIS — M6281 Muscle weakness (generalized): Secondary | ICD-10-CM | POA: Diagnosis not present

## 2022-11-26 DIAGNOSIS — Z96652 Presence of left artificial knee joint: Secondary | ICD-10-CM | POA: Diagnosis not present

## 2022-11-26 DIAGNOSIS — M1712 Unilateral primary osteoarthritis, left knee: Secondary | ICD-10-CM | POA: Diagnosis not present

## 2022-11-29 DIAGNOSIS — M6281 Muscle weakness (generalized): Secondary | ICD-10-CM | POA: Diagnosis not present

## 2022-11-29 DIAGNOSIS — M1712 Unilateral primary osteoarthritis, left knee: Secondary | ICD-10-CM | POA: Diagnosis not present

## 2022-11-29 DIAGNOSIS — Z96652 Presence of left artificial knee joint: Secondary | ICD-10-CM | POA: Diagnosis not present

## 2022-12-05 DIAGNOSIS — M1712 Unilateral primary osteoarthritis, left knee: Secondary | ICD-10-CM | POA: Diagnosis not present

## 2022-12-05 DIAGNOSIS — Z96652 Presence of left artificial knee joint: Secondary | ICD-10-CM | POA: Diagnosis not present

## 2022-12-05 DIAGNOSIS — M6281 Muscle weakness (generalized): Secondary | ICD-10-CM | POA: Diagnosis not present

## 2022-12-12 DIAGNOSIS — M6281 Muscle weakness (generalized): Secondary | ICD-10-CM | POA: Diagnosis not present

## 2022-12-12 DIAGNOSIS — M1712 Unilateral primary osteoarthritis, left knee: Secondary | ICD-10-CM | POA: Diagnosis not present

## 2022-12-12 DIAGNOSIS — Z96652 Presence of left artificial knee joint: Secondary | ICD-10-CM | POA: Diagnosis not present

## 2022-12-18 DIAGNOSIS — M6281 Muscle weakness (generalized): Secondary | ICD-10-CM | POA: Diagnosis not present

## 2022-12-18 DIAGNOSIS — M1712 Unilateral primary osteoarthritis, left knee: Secondary | ICD-10-CM | POA: Diagnosis not present

## 2022-12-18 DIAGNOSIS — Z96652 Presence of left artificial knee joint: Secondary | ICD-10-CM | POA: Diagnosis not present

## 2022-12-21 DIAGNOSIS — M1712 Unilateral primary osteoarthritis, left knee: Secondary | ICD-10-CM | POA: Diagnosis not present

## 2022-12-21 DIAGNOSIS — M6281 Muscle weakness (generalized): Secondary | ICD-10-CM | POA: Diagnosis not present

## 2022-12-21 DIAGNOSIS — Z96652 Presence of left artificial knee joint: Secondary | ICD-10-CM | POA: Diagnosis not present

## 2022-12-24 ENCOUNTER — Ambulatory Visit: Payer: 59 | Admitting: Family Medicine

## 2022-12-25 DIAGNOSIS — M1712 Unilateral primary osteoarthritis, left knee: Secondary | ICD-10-CM | POA: Diagnosis not present

## 2022-12-25 DIAGNOSIS — M6281 Muscle weakness (generalized): Secondary | ICD-10-CM | POA: Diagnosis not present

## 2022-12-25 DIAGNOSIS — Z96652 Presence of left artificial knee joint: Secondary | ICD-10-CM | POA: Diagnosis not present

## 2022-12-27 ENCOUNTER — Encounter: Payer: Self-pay | Admitting: Nurse Practitioner

## 2023-01-04 ENCOUNTER — Ambulatory Visit (INDEPENDENT_AMBULATORY_CARE_PROVIDER_SITE_OTHER): Payer: 59 | Admitting: Family Medicine

## 2023-01-04 ENCOUNTER — Encounter: Payer: Self-pay | Admitting: Family Medicine

## 2023-01-04 VITALS — BP 108/69 | HR 78 | Temp 97.9°F | Ht 62.0 in | Wt 198.4 lb

## 2023-01-04 DIAGNOSIS — E890 Postprocedural hypothyroidism: Secondary | ICD-10-CM

## 2023-01-04 DIAGNOSIS — Z96652 Presence of left artificial knee joint: Secondary | ICD-10-CM | POA: Insufficient documentation

## 2023-01-04 DIAGNOSIS — M1712 Unilateral primary osteoarthritis, left knee: Secondary | ICD-10-CM | POA: Diagnosis not present

## 2023-01-04 DIAGNOSIS — I10 Essential (primary) hypertension: Secondary | ICD-10-CM

## 2023-01-04 LAB — TSH: TSH: 0.28 u[IU]/mL — ABNORMAL LOW (ref 0.35–5.50)

## 2023-01-04 MED ORDER — HYDROCHLOROTHIAZIDE 12.5 MG PO CAPS: 12.5000 mg | ORAL_CAPSULE | Freq: Every day | ORAL | 3 refills | Status: DC

## 2023-01-04 NOTE — Patient Instructions (Signed)
Please return in July for your annual complete physical; please come fasting.   I will release your lab results to you on your MyChart account with further instructions. You may see the results before I do, but when I review them I will send you a message with my report or have my assistant call you if things need to be discussed. Please reply to my message with any questions. Thank you!   If you have any questions or concerns, please don't hesitate to send me a message via MyChart or call the office at [phone]. Thank you for visiting with [place] today! It's our pleasure caring for you.

## 2023-01-04 NOTE — Progress Notes (Signed)
Subjective  CC:  Chief Complaint  Patient presents with   Follow-up   Hypertension    Pt wanted to speak about medicine    HPI: Alison [name] is a 62 y.o. female who presents to the office today to address the problems listed above in the chief complaint. Hypertension f/u: Control is good . Pt reports she is doing well. taking medications as instructed, no medication side effects noted, no TIAs, no chest pain on exertion, no dyspnea on exertion, no swelling of ankles. On hctz 25 qod. Likes it for fluid retention as well. She denies adverse effects from his BP medications. Compliance with medication is good.  Low thyroid now back on levotx 174mg daily since October for TSH that was suppressed. Feels great. S/p left knee replacement with good recovery. Feels that she may need TKR on the left as well.  Lab Results  Component Value Date   TSH 0.14 (L) 10/08/2022    Assessment  1. Essential hypertension   2. Postablative hypothyroidism   3. Status post left knee replacement   4. Primary osteoarthritis of left knee      Plan   Hypertension f/u: BP control is well controlled. Will change to hctz 12.5 daily for regular effects and constant bp control. Renal and lytes are fine.  Recheck TSH on levotx 1050m.  Knee replacement. Feeling much better. Working on rehab still. Now with left OA knee pain. Will f/u with ortho. Supportive care for now.  Education regarding management of these chronic disease states was given. Management strategies discussed on successive visits include dietary and exercise recommendations, goals of achieving and maintaining IBW, and lifestyle modifications aiming for adequate sleep and minimizing stressors.   Follow up: 6 mo for cpe  Orders Placed This Encounter  Procedures   TSH   Meds ordered this encounter  Medications   hydrochlorothiazide (MICROZIDE) 12.5 MG capsule    Sig: Take 1 capsule (12.5 mg total) by mouth daily.    Dispense:  90 capsule     Refill:  3      BP Readings from Last 3 Encounters:  01/04/23 108/69  10/31/22 128/65  10/24/22 129/85   Wt Readings from Last 3 Encounters:  01/04/23 198 lb 6.4 oz (90 kg)  10/31/22 198 lb 6.6 oz (90 kg)  10/24/22 199 lb (90.3 kg)    Lab Results  Component Value Date   CHOL 184 03/14/2022   CHOL 166 10/11/2020   CHOL 204 (H) 09/29/2019   Lab Results  Component Value Date   HDL 64.00 03/14/2022   HDL 54 10/11/2020   HDL 49.30 09/29/2019   Lab Results  Component Value Date   LDLCALC 107 (H) 03/14/2022   LDLCALC 95 10/11/2020   LDLCALC 131 (H) 09/29/2019   Lab Results  Component Value Date   TRIG 65.0 03/14/2022   TRIG 77 10/11/2020   TRIG 120.0 09/29/2019   Lab Results  Component Value Date   CHOLHDL 3 03/14/2022   CHOLHDL 3.1 10/11/2020   CHOLHDL 4 09/29/2019   No results found for: "LDLDIRECT" Lab Results  Component Value Date   CREATININE 0.79 10/24/2022   BUN 18 10/24/2022   NA 137 10/24/2022   K 3.8 10/24/2022   CL 106 10/24/2022   CO2 24 10/24/2022    The 10-year ASCVD risk score (Arnett DK, et al., 2019) is: 3.5%   Values used to calculate the score:     Age: [age]     Sex:  Female     Is Non-Hispanic African American: Yes     Diabetic: No     Tobacco smoker: No     Systolic Blood Pressure: 196 mmHg     Is BP treated: Yes     HDL Cholesterol: 64 mg/dL     Total Cholesterol: 184 mg/dL  I reviewed the patients updated PMH, FH, and SocHx.    Patient Active Problem List   Diagnosis Date Noted   Postablative hypothyroidism 02/03/2020    Priority: High   Systolic murmur 22/29/7989    Priority: High   GAD (generalized anxiety disorder) 06/24/2018    Priority: High   Mixed hyperlipidemia 06/24/2018    Priority: High   Essential hypertension 05/31/2018    Priority: High   Family history of premature CAD 12/29/2013    Priority: High   History of TIA (transient ischemic attack) 12/10/1980    Priority: High   Gastroesophageal reflux  disease 10/11/2020    Priority: Medium    S/P gastric sleeve procedure 10/11/2020    Priority: Medium    Mild intermittent asthma without complication 21/19/4174    Priority: Medium    Primary osteoarthritis of both knees 09/11/2016    Priority: Medium    Gastric banding status 10/07/2015    Priority: Medium    Restless legs syndrome (RLS) 02/14/2015    Priority: Medium    Insomnia 12/29/2013    Priority: Medium    Primary osteoarthritis of left knee 01/04/2023   Status post left knee replacement 01/04/2023    Allergies: Aspirin, Cherry, Other, Soy allergy, and Penicillins  Social History: Patient  reports that she has never smoked. She has never used smokeless tobacco. She reports that she does not currently use alcohol. She reports that she does not use drugs.  Current Meds  Medication Sig   acetaminophen (TYLENOL) 500 MG tablet Take 1,000 mg by mouth every 6 (six) hours as needed for moderate pain.   albuterol (VENTOLIN HFA) 108 (90 Base) MCG/ACT inhaler Inhale 2 puffs into the lungs every 6 (six) hours as needed. For asthma   ALPRAZolam (XANAX) 0.25 MG tablet Take 1 tablet (0.25 mg total) by mouth at bedtime as needed for anxiety or sleep.   calcium carbonate (TUMS - DOSED IN MG ELEMENTAL CALCIUM) 500 MG chewable tablet Chew 1 tablet by mouth 3 (three) times daily as needed for indigestion or heartburn.   escitalopram (LEXAPRO) 10 MG tablet Take 1 tablet (10 mg total) by mouth daily.   esomeprazole (NEXIUM) 40 MG capsule Take 40 mg by mouth every evening.   famotidine (PEPCID) 40 MG tablet Take 1 tablet (40 mg total) by mouth daily.   hydrochlorothiazide (MICROZIDE) 12.5 MG capsule Take 1 capsule (12.5 mg total) by mouth daily.   levothyroxine (SYNTHROID) 100 MCG tablet TAKE 1 TABLET BY MOUTH EVERY DAY BEFORE BREAKFAST   Multiple Vitamins-Minerals (HAIR/SKIN/NAILS) TABS Take 1 tablet by mouth daily.   pantoprazole (PROTONIX) 40 MG tablet Take 1 tablet (40 mg total) by mouth  daily. (Patient taking differently: Take 40 mg by mouth in the morning.)   rOPINIRole (REQUIP) 3 MG tablet Take 1 tablet (3 mg total) by mouth at bedtime.   rosuvastatin (CRESTOR) 40 MG tablet Take 1 tablet (40 mg total) by mouth at bedtime.   sucralfate (CARAFATE) 1 g tablet Take 1 g by mouth 2 (two) times daily.   traZODone (DESYREL) 50 MG tablet Take 1-2 tablets (50-100 mg total) by mouth at bedtime as needed for sleep.   [  DISCONTINUED] hydrochlorothiazide (HYDRODIURIL) 25 MG tablet Take 25 mg by mouth every other day.    Review of Systems: Cardiovascular: negative for chest pain, palpitations, leg swelling, orthopnea Respiratory: negative for SOB, wheezing or persistent cough Gastrointestinal: negative for abdominal pain Genitourinary: negative for dysuria or gross hematuria  Objective  Vitals: BP 108/69 (BP Location: Right Arm, Patient Position: Sitting)   Pulse 78   Temp 97.9 F (36.6 C) (Temporal)   Ht '5\' 2"'$  (1.575 m)   Wt 198 lb 6.4 oz (90 kg)   SpO2 98%   BMI 36.29 kg/m  General: no acute distress  Psych:  Alert and oriented, normal mood and affect HEENT:  Normocephalic, atraumatic, supple neck  Cardiovascular:  RRR without murmur. no edema Respiratory:  Good breath sounds bilaterally, CTAB with normal respiratory effort Left knee: well healed surgical scar. From. Intoeing Right knee w/o effusion  Commons side effects, risks, benefits, and alternatives for medications and treatment plan prescribed today were discussed, and the patient expressed understanding of the given instructions. Patient is instructed to call or message via MyChart if he/she has any questions or concerns regarding our treatment plan. No barriers to understanding were identified. We discussed Red Flag symptoms and signs in detail. Patient expressed understanding regarding what to do in case of urgent or emergency type symptoms.  Medication list was reconciled, printed and provided to the patient in AVS.  Patient instructions and summary information was reviewed with the patient as documented in the AVS. This note was prepared with assistance of Dragon voice recognition software. Occasional wrong-word or sound-a-like substitutions may have occurred due to the inherent limitation

## 2023-01-09 DIAGNOSIS — Z96652 Presence of left artificial knee joint: Secondary | ICD-10-CM | POA: Diagnosis not present

## 2023-01-09 DIAGNOSIS — M6281 Muscle weakness (generalized): Secondary | ICD-10-CM | POA: Diagnosis not present

## 2023-01-09 DIAGNOSIS — M1712 Unilateral primary osteoarthritis, left knee: Secondary | ICD-10-CM | POA: Diagnosis not present

## 2023-01-10 DIAGNOSIS — M1711 Unilateral primary osteoarthritis, right knee: Secondary | ICD-10-CM | POA: Diagnosis not present

## 2023-01-16 DIAGNOSIS — Z96652 Presence of left artificial knee joint: Secondary | ICD-10-CM | POA: Diagnosis not present

## 2023-01-16 DIAGNOSIS — M6281 Muscle weakness (generalized): Secondary | ICD-10-CM | POA: Diagnosis not present

## 2023-01-16 DIAGNOSIS — M1712 Unilateral primary osteoarthritis, left knee: Secondary | ICD-10-CM | POA: Diagnosis not present

## 2023-01-17 ENCOUNTER — Encounter: Payer: Self-pay | Admitting: Nurse Practitioner

## 2023-01-17 ENCOUNTER — Ambulatory Visit (INDEPENDENT_AMBULATORY_CARE_PROVIDER_SITE_OTHER): Payer: 59 | Admitting: Nurse Practitioner

## 2023-01-17 VITALS — BP 138/88 | HR 65 | Ht 62.0 in | Wt 200.0 lb

## 2023-01-17 DIAGNOSIS — K219 Gastro-esophageal reflux disease without esophagitis: Secondary | ICD-10-CM | POA: Diagnosis not present

## 2023-01-17 DIAGNOSIS — R112 Nausea with vomiting, unspecified: Secondary | ICD-10-CM

## 2023-01-17 NOTE — Patient Instructions (Addendum)
You have been scheduled for an endoscopy. Please follow written instructions given to you at your visit today. If you use inhalers (even only as needed), please bring them with you on the day of your procedure.  Pantoprazole 40 mg- 1 by mouth to be taken 30 minutes before breakfast and dinner  Contact our office in June 2024 to discuss a screening colonoscopy.  Due to recent changes in healthcare laws, you may see the results of your imaging and laboratory studies on MyChart before your provider has had a chance to review them.  We understand that in some cases there may be results that are confusing or concerning to you. Not all laboratory results come back in the same time frame and the provider may be waiting for multiple results in order to interpret others.  Please give [place] 48 hours in order for your provider to thoroughly review all the results before contacting the office for clarification of your results.    Thank you for trusting me with your gastrointestinal care!   Carl Best, CRNP

## 2023-01-17 NOTE — Progress Notes (Signed)
01/17/2023 Alison [name] [id number] Jun 27, 1961   CHIEF COMPLAINT: Acid reflux   HISTORY OF PRESENT ILLNESS: Alison [name] is a 62 year old female with a past medical history of hypertension, TIA in 1982, mild bilateral carotid stenosis, asthma, thyroid disease s/p ablation 2004, GERD and obesity s/p lap band surgery 2016 with subsequent lab band removal and gastric sleeve surgery 2021. She presents to our office today as referred by Dr. Gurney Maxin for further evaluation regarding reflux symptoms. She endorses having worsening reflux symptoms with nausea and vomiting a few times weekly for the past 6 months. She describes having increased saliva production followed by nausea and vomiting. No hematemesis. She also feels food is slow to pass down the esophagus and sometimes food gets stuck in the mid esophagus which either comes back up or in a minute or two passes down which has occurred for the past year. No abdominal pain. She alternates taking Pantoprazole 99m QD with Esomeprazole 428mQD and takes Famotidine 4065mD for the past 4 months. She also takes Carafate 1gm po QD. She underwent a barium swallow study 08/21/2022 which showed a patulous thoracic esophagus with moderate esophageal dysmotility, mild reflux and mild smooth fold thickening in the gastric antrum. She is passing a normal formed stool once daily. No rectal bleeding or black stools. She underwent a colonoscopy by a GI in [place] years ago which she reported found a few benign polyps. No known family history of colorectal cancer.   She is having right knee replacement surgery in February 25, 2023.      Latest Ref Rng & Units 10/24/2022   11:30 AM 03/14/2022    9:59 AM 05/16/2021    5:13 PM  CBC  WBC 4.0 - 10.5 K/uL 12.8  6.1  7.6   Hemoglobin 12.0 - 15.0 g/dL 14.6  13.9  14.8   Hematocrit 36.0 - 46.0 % 42.5  40.0  42.8   Platelets 150 - 400 K/uL 221  192.0  210        Latest Ref Rng & Units 10/24/2022   11:30 AM  03/14/2022    9:59 AM 05/16/2021    5:13 PM  CMP  Glucose 70 - 99 mg/dL 100  76  93   BUN 6 - 20 mg/dL 18  23  19   $ Creatinine 0.44 - 1.00 mg/dL 0.79  0.80  0.72   Sodium 135 - 145 mmol/L 137  139  139   Potassium 3.5 - 5.1 mmol/L 3.8  3.7  3.7   Chloride 98 - 111 mmol/L 106  105  103   CO2 22 - 32 mmol/L 24  27  27   $ Calcium 8.9 - 10.3 mg/dL 9.5  9.9  9.7   Total Protein 6.5 - 8.1 g/dL 6.9  6.0  6.4   Total Bilirubin 0.3 - 1.2 mg/dL 0.7  0.5  0.5   Alkaline Phos 38 - 126 U/L 66  67  82   AST 15 - 41 U/L 26  21  20   $ ALT 0 - 44 U/L 25  22  21      $ Barium Swallow Study 9/12/20023: 1. Patulous thoracic esophagus with moderate esophageal dysmotility, most pronounced in the lower thoracic esophagus, and probably the sequela of the prior chronic gastric band. 2. No hiatal hernia.  Mild gastroesophageal reflux elicited. 3. Mild smooth fold thickening in the gastric antrum, nonspecific, cannot exclude antral gastritis. No evidence of strictures, masses  or peptic ulcer disease.   TTE 02/20/2011: Study conclusions: Left ventricle: The cavity size was normal. Wall thickness was increased in a pattern of mild LVH. The estimated ejection fraction was 60%. Wall motion was normal; there were no regional wall motion normalities. Aortic valve: Sclerosis without stenosis.  Carotid US 08/18/18: Final Interpretation:  Right Carotid: Velocities in the right ICA are consistent with a 1-39%  stenosis.   Left Carotid: Velocities in the left ICA are consistent with a 1-39%  stenosis.   Vertebrals:  Bilateral vertebral arteries demonstrate antegrade flow.  Subclavians: Normal flow hemodynamics were seen in bilateral subclavian               arteries.   Past Medical History:  Diagnosis Date   Anemia    prior to hysterectomy    Anxiety    Arthritis    LEFT KNEE   Asthma    Cervical myelopathy ([place]) 08/28/2018   Family history of adverse reaction to anesthesia    Sister PONV   GERD  (gastroesophageal reflux disease)    Heart murmur    Hx-TIA (transient ischemic attack) 1992   Hyperlipidemia    Hypertension    Hypothyroidism    Obesity    Pneumonia    Polycystic ovarian syndrome    Past Surgical History:  Procedure Laterality Date   ABDOMINAL HYSTERECTOMY  2009   Partial   ANTERIOR CERVICAL DECOMPRESSION/DISCECTOMY FUSION 4 LEVELS N/A 08/28/2018   Procedure: ANTERIOR CERVICAL DECOMPRESSION FUSION, CERVICAL 4-5, CERVICAL 5-6, CERVICAL 6-7 WITH INSTRUMENTATION AND ALLOGRAFT;  Surgeon: Phylliss Bob, MD;  Location: [facility];  Service: Orthopedics;  Laterality: N/A;   BREAST LUMPECTOMY Left 06/27/2020   Procedure: LEFT BREAST LUMPECTOMY;  Surgeon: Coralie Keens, MD;  Location: [facility];  Service: General;  Laterality: Left;  LMA   DIAGNOSTIC LAPAROSCOPY     FEMUR FRACTURE SURGERY  2008   Left   FRACTURE SURGERY     I & D EXTREMITY Left 06/01/2018   Procedure: IRRIGATION AND DEBRIDEMENT LEFT CALF;  Surgeon: Mcarthur Rossetti, MD;  Location: [facility];  Service: Orthopedics;  Laterality: Left;   KNEE CARTILAGE SURGERY  2005   LAPAROSCOPIC GASTRIC BAND REMOVAL WITH LAPAROSCOPIC GASTRIC SLEEVE RESECTION N/A 05/03/2020   Procedure: LAPAROSCOPIC GASTRIC BAND REMOVAL WITH LAPAROSCOPIC GASTRIC SLEEVE RESECTION; UPPER ENDO AND ERAS PATHWAY;  Surgeon: Kinsinger, Arta Bruce, MD;  Location: [facility];  Service: General;  Laterality: N/A;   LAPAROSCOPIC GASTRIC BANDING  11/12/2011   Procedure: LAPAROSCOPIC GASTRIC BANDING;  Surgeon: Judieth Keens, DO;  Location: [facility];  Service: General;  Laterality: N/A;  nathanson liver retractor/ endostitch available 2-0 surgidac refills/ layton needle drivers available/ [facility] Doctor to proctor   TONSILLECTOMY  2007   TOTAL KNEE ARTHROPLASTY Left 10/31/2022   Procedure: TOTAL KNEE ARTHROPLASTY;  Surgeon: Willaim Sheng, MD;  Location: [facility];  Service: Orthopedics;  Laterality: Left;   Social History: She is single. 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 She has one sone. Nonsmoker. No alcohol use. No drug use.    Family History: family history includes Anesthesia problems in her sister; Breast cancer (age of onset: 24) in her maternal aunt; Cancer in her maternal aunt; Glaucoma in her mother; Heart disease in her father; Hyperthyroidism in her mother; Migraines in her sister; Myasthenia gravis in her paternal grandfather; Ovarian cancer in her maternal grandmother; Stroke in her father; Uterine cancer in her maternal grandmother. No known family history of esophageal, gastric or  colon cancer.   Allergies  Allergen Reactions   Aspirin Anaphylaxis   Cherry Hives and Shortness Of Breath   Other Hives and Itching    Walnuts and pecans - cause itching and hives in mouth   Soy Allergy Hives    " hives in mouth and wheezing"    Penicillins Itching    Has patient had a PCN reaction causing immediate rash, facial/tongue/throat swelling, SOB or lightheadedness with hypotension: No Has patient had a PCN reaction causing severe rash involving mucus membranes or skin necrosis: No Has patient had a PCN reaction that required hospitalization: No Has patient had a PCN reaction occurring within the last 10 years: No If all of the above answers are "NO", then may proceed with Cephalosporin use.       Outpatient Encounter Medications as of 01/17/2023  Medication Sig   acetaminophen (TYLENOL) 500 MG tablet Take 1,000 mg by mouth every 6 (six) hours as needed for moderate pain.   albuterol (VENTOLIN HFA) 108 (90 Base) MCG/ACT inhaler Inhale 2 puffs into the lungs every 6 (six) hours as needed. For asthma   ALPRAZolam (XANAX) 0.25 MG tablet Take 1 tablet (0.25 mg total) by mouth at bedtime as needed for anxiety or sleep.   calcium carbonate (TUMS - DOSED IN MG ELEMENTAL CALCIUM) 500 MG chewable tablet Chew 1 tablet by mouth 3 (three) times daily as needed for indigestion or heartburn.   escitalopram (LEXAPRO) 10 MG tablet Take 1 tablet  (10 mg total) by mouth daily.   esomeprazole (NEXIUM) 40 MG capsule Take 40 mg by mouth daily as needed.   famotidine (PEPCID) 40 MG tablet Take 1 tablet (40 mg total) by mouth daily.   hydrochlorothiazide (MICROZIDE) 12.5 MG capsule Take 1 capsule (12.5 mg total) by mouth daily.   levothyroxine (SYNTHROID) 100 MCG tablet TAKE 1 TABLET BY MOUTH EVERY DAY BEFORE BREAKFAST   Multiple Vitamins-Minerals (HAIR/SKIN/NAILS) TABS Take 1 tablet by mouth daily.   pantoprazole (PROTONIX) 40 MG tablet Take 1 tablet (40 mg total) by mouth daily. (Patient taking differently: Take 40 mg by mouth daily as needed.)   rOPINIRole (REQUIP) 3 MG tablet Take 1 tablet (3 mg total) by mouth at bedtime.   rosuvastatin (CRESTOR) 40 MG tablet Take 1 tablet (40 mg total) by mouth at bedtime.   sucralfate (CARAFATE) 1 g tablet Take 1 g by mouth daily in the afternoon.   traZODone (DESYREL) 50 MG tablet Take 1-2 tablets (50-100 mg total) by mouth at bedtime as needed for sleep.   No facility-administered encounter medications on file as of 01/17/2023.    REVIEW OF SYSTEMS:  Gen: Denies fever, sweats or chills. No weight loss.  CV: Denies chest pain, palpitations or edema. Resp: Denies cough, shortness of breath of hemoptysis.  GI: See HPI. GU : Denies urinary burning, blood in urine, increased urinary frequency or incontinence. MS: + Arthritis.  Derm: Denies rash, itchiness, skin lesions or unhealing ulcers. Psych: + Anxiety.  Heme: Denies bruising, easy bleeding. Neuro:  Denies headaches, dizziness or paresthesias. Endo:  No diabetes.   PHYSICAL EXAM: BP 138/88   Pulse 65   Ht 5' 2"$  (1.575 m)   Wt 200 lb (90.7 kg)   BMI 36.58 kg/m  General: 62 year old female in no acute distress. Head: Normocephalic and atraumatic. Eyes:  Sclerae non-icteric, conjunctive pink. Ears: Normal auditory acuity. Mouth: Dentition intact. No ulcers or lesions.  Neck: Supple, no lymphadenopathy or thyromegaly.  Lungs: Clear  bilaterally to  auscultation without wheezes, crackles or rhonchi. Heart: Regular rate and rhythm. Systolic murmur (since childhood). No rub or gallop appreciated.  Abdomen: Soft, nontender, nondistended. No masses. No hepatosplenomegaly. Normoactive bowel sounds x 4 quadrants.  Rectal: Deferred.  Musculoskeletal: Symmetrical with no gross deformities. Skin: Warm and dry. No rash or lesions on visible extremities. Extremities: No edema. Neurological: Alert oriented x 4, no focal deficits.  Psychological:  Alert and cooperative. Normal mood and affect.  ASSESSMENT AND PLAN:  86) 62 year old female s/p past lab band surgery, subsequent lab band removal and gastric sleeve surgery with acid reflux symptoms, N/V and dysphagia. Barium swallow study showed evidence of mild reflux, esophageal dysmotility and mild fold thickening to the gastric antrum. -Avoid spicy/fatty foods -EGD with possible esophagea dilatation benefits and risks discussed including risk with sedation, risk of bleeding, perforation and infection. Patient wishes to schedule EGD as soon as possible, prior to her right knee replacement surgery  -Pantoprazole 66m po QD, Famotidine 411mQD -Patient to contact office if symptoms worsen   2) Personal history of colon polyps per reported colonoscopy 15 years  -Colonoscopy benefits and risks discussed including risk with sedation, risk of bleeding, perforation and infection  -Patient wishes to schedule a colonoscopy after she recovers from her right total knee surgery which is scheduled 02/25/2023  Further recommendations to be determined after the above evaluation completed           CC:  Kinsinger, LuArta Bruce*

## 2023-01-21 NOTE — Progress Notes (Signed)
I agree with the assessment and plan as outlined by Ms. Berniece Pap.

## 2023-01-23 ENCOUNTER — Encounter: Payer: Self-pay | Admitting: Internal Medicine

## 2023-01-23 ENCOUNTER — Ambulatory Visit (AMBULATORY_SURGERY_CENTER): Payer: 59 | Admitting: Internal Medicine

## 2023-01-23 VITALS — BP 116/54 | HR 75 | Temp 97.3°F | Resp 14 | Ht 62.0 in | Wt 200.0 lb

## 2023-01-23 DIAGNOSIS — K2970 Gastritis, unspecified, without bleeding: Secondary | ICD-10-CM

## 2023-01-23 DIAGNOSIS — E039 Hypothyroidism, unspecified: Secondary | ICD-10-CM | POA: Diagnosis not present

## 2023-01-23 DIAGNOSIS — K319 Disease of stomach and duodenum, unspecified: Secondary | ICD-10-CM | POA: Diagnosis not present

## 2023-01-23 DIAGNOSIS — R112 Nausea with vomiting, unspecified: Secondary | ICD-10-CM | POA: Diagnosis not present

## 2023-01-23 DIAGNOSIS — K2990 Gastroduodenitis, unspecified, without bleeding: Secondary | ICD-10-CM

## 2023-01-23 DIAGNOSIS — K3189 Other diseases of stomach and duodenum: Secondary | ICD-10-CM | POA: Diagnosis not present

## 2023-01-23 DIAGNOSIS — F419 Anxiety disorder, unspecified: Secondary | ICD-10-CM | POA: Diagnosis not present

## 2023-01-23 DIAGNOSIS — K317 Polyp of stomach and duodenum: Secondary | ICD-10-CM

## 2023-01-23 DIAGNOSIS — E785 Hyperlipidemia, unspecified: Secondary | ICD-10-CM | POA: Diagnosis not present

## 2023-01-23 DIAGNOSIS — R1310 Dysphagia, unspecified: Secondary | ICD-10-CM

## 2023-01-23 DIAGNOSIS — K219 Gastro-esophageal reflux disease without esophagitis: Secondary | ICD-10-CM

## 2023-01-23 DIAGNOSIS — K449 Diaphragmatic hernia without obstruction or gangrene: Secondary | ICD-10-CM

## 2023-01-23 DIAGNOSIS — K2980 Duodenitis without bleeding: Secondary | ICD-10-CM

## 2023-01-23 DIAGNOSIS — E669 Obesity, unspecified: Secondary | ICD-10-CM | POA: Diagnosis not present

## 2023-01-23 DIAGNOSIS — I10 Essential (primary) hypertension: Secondary | ICD-10-CM | POA: Diagnosis not present

## 2023-01-23 MED ORDER — PANTOPRAZOLE SODIUM 40 MG PO TBEC: DELAYED_RELEASE_TABLET | ORAL | 2 refills | Status: DC

## 2023-01-23 MED ORDER — SODIUM CHLORIDE 0.9 % IV SOLN: 500.0000 mL | INTRAVENOUS | Status: DC

## 2023-01-23 NOTE — Op Note (Signed)
[facility] Patient Name: Alison [name] Procedure Date: 01/23/2023 11:03 AM MRN: [id number] Endoscopist: Adline Mango [place] , , [id number] Age: 62 Referring MD:  Date of Birth: 1961/04/14 Gender: Female Account #: [id number] Procedure:                Upper GI endoscopy Indications:              Dysphagia, Nausea with vomiting Medicines:                Monitored Anesthesia Care Procedure:                Pre-Anesthesia Assessment:                           - Prior to the procedure, a History and Physical                            was performed, and patient medications and                            allergies were reviewed. The patient's tolerance of                            previous anesthesia was also reviewed. The risks                            and benefits of the procedure and the sedation                            options and risks were discussed with the patient.                            All questions were answered, and informed consent                            was obtained. Prior Anticoagulants: The patient has                            taken no anticoagulant or antiplatelet agents. ASA                            Grade Assessment: II - A patient with mild systemic                            disease. After reviewing the risks and benefits,                            the patient was deemed in satisfactory condition to                            undergo the procedure.                           After obtaining informed consent, the endoscope was  passed under direct vision. Throughout the                            procedure, the patient's blood pressure, pulse, and                            oxygen saturations were monitored continuously. The                            Olympus Scope [id number] was introduced through the                            mouth, and advanced to the second part of duodenum.                            The upper GI  endoscopy was accomplished without                            difficulty. The patient tolerated the procedure                            well. Scope In: Scope Out: Findings:                 Biopsies were taken with a cold forceps in the                            proximal esophagus and in the distal esophagus for                            histology.                           A large hiatal hernia was present.                           Five 4 to 10 mm sessile polyps with no bleeding and                            no stigmata of recent bleeding were found in the                            cardia and in the gastric body. These polyps were                            removed with a cold snare. Resection and retrieval                            were complete.                           Localized mild inflammation characterized by                            congestion (edema) and erythema was  found in the                            gastric antrum. Biopsies were taken with a cold                            forceps for histology.                           Evidence of a sleeve gastrectomy was found in the                            gastric body. This was characterized by healthy                            appearing mucosa.                           Localized mild inflammation characterized by                            congestion (edema) and erythema was found in the                            duodenal bulb. Biopsies were taken with a cold                            forceps for histology. Complications:            No immediate complications. Estimated Blood Loss:     Estimated blood loss was minimal. Impression:               - Large hiatal hernia.                           - Five gastric polyps. Resected and retrieved.                           - Gastritis. Biopsied.                           - A sleeve gastrectomy was found, characterized by                            healthy appearing  mucosa.                           - Duodenitis. Biopsied.                           - Biopsies were taken with a cold forceps for                            histology in the proximal esophagus and in the                            distal esophagus. Recommendation:           -  Discharge patient to home (with escort).                           - Await pathology results.                           - Use Protonix (pantoprazole) 40 mg PO BID for 8                            weeks.                           - Return to GI clinic in 6 weeks.                           - The findings and recommendations were discussed                            with the patient. Dr Georgian Co "Lyndee Leo" Lorenso Courier,  01/23/2023 11:32:51 AM

## 2023-01-23 NOTE — Progress Notes (Signed)
Pt's states no medical or surgical changes since previsit or office visit. 

## 2023-01-23 NOTE — Progress Notes (Signed)
Report to pacu rn. Vss. Care resumed by rn. 

## 2023-01-23 NOTE — Patient Instructions (Addendum)
Await pathology results. Use Protonix (pantoprazole) 40 mg 1 tablet twice daily for 8 weeks. Return to GI clinic in 6 weeks. Monday 03-25-23 at 3:00 pm   YOU HAD AN ENDOSCOPIC PROCEDURE TODAY AT [facility]:   Refer to the procedure report that was given to you for any specific questions about what was found during the examination.  If the procedure report does not answer your questions, please call your gastroenterologist to clarify.  If you requested that your care partner not be given the details of your procedure findings, then the procedure report has been included in a sealed envelope for you to review at your convenience later.  YOU SHOULD EXPECT: Some feelings of bloating in the abdomen. Passage of more gas than usual.   Please Note:  You might notice some irritation and congestion in your nose or some drainage.  This is from the oxygen used during your procedure.  There is no need for concern and it should clear up in a day or so.  SYMPTOMS TO REPORT IMMEDIATELY: Following upper endoscopy (EGD)  Vomiting of blood or coffee ground material  New chest pain or pain under the shoulder blades  Painful or persistently difficult swallowing  New shortness of breath  Fever of 100F or higher  Black, tarry-looking stools  For urgent or emergent issues, a gastroenterologist can be reached at any hour by calling [phone]. Do not use MyChart messaging for urgent concerns.    DIET:  We do recommend a small meal at first, but then you may proceed to your regular diet.  Drink plenty of fluids but you should avoid alcoholic beverages for 24 hours.  ACTIVITY:  You should plan to take it easy for the rest of today and you should NOT DRIVE or use heavy machinery until tomorrow (because of the sedation medicines used during the test).    FOLLOW UP: Our staff will call the number listed on your records the next business day following your procedure.  We will call around 7:15-  8:00 am to check on you and address any questions or concerns that you may have regarding the information given to you following your procedure. If we do not reach you, we will leave a message.     If any biopsies were taken you will be contacted by phone or by letter within the next 1-3 weeks.  Please call us at [phone] if you have not heard about the biopsies in 3 weeks.    SIGNATURES/CONFIDENTIALITY: You and/or your care partner have signed paperwork which will be entered into your electronic medical record.  These signatures attest to the fact that that the information above on your After Visit Summary has been reviewed and is understood.  Full responsibility of the confidentiality of this discharge information lies with you and/or your care-partner.

## 2023-01-23 NOTE — Progress Notes (Signed)
GASTROENTEROLOGY PROCEDURE H&P NOTE   Primary Care Physician: Leamon Arnt, MD    Reason for Procedure:   N&V, dysphagia, history of gastric sleeve surgery  Plan:    EGD  Patient is appropriate for endoscopic procedure(s) in the ambulatory ([facility]) setting.  The nature of the procedure, as well as the risks, benefits, and alternatives were carefully and thoroughly reviewed with the patient. Ample time for discussion and questions allowed. The patient understood, was satisfied, and agreed to proceed.     HPI: Alison [name] is a 63 y.o. female who presents for EGD for evaluation of N&V, dysphagia, history of gastric sleeve surgery .  Patient was most recently seen in the Gastroenterology Clinic on 01/17/23.  No interval change in medical history since that appointment. Please refer to that note for full details regarding GI history and clinical presentation.   Past Medical History:  Diagnosis Date   Anemia    prior to hysterectomy    Anxiety    Arthritis    LEFT KNEE   Asthma    Cervical myelopathy ([place]) 08/28/2018   Family history of adverse reaction to anesthesia    Sister PONV   GERD (gastroesophageal reflux disease)    Heart murmur    Hx-TIA (transient ischemic attack) 1992   Hyperlipidemia    Hypertension    Hypothyroidism    Obesity    Pneumonia    Polycystic ovarian syndrome     Past Surgical History:  Procedure Laterality Date   ABDOMINAL HYSTERECTOMY  2009   Partial   ANTERIOR CERVICAL DECOMPRESSION/DISCECTOMY FUSION 4 LEVELS N/A 08/28/2018   Procedure: ANTERIOR CERVICAL DECOMPRESSION FUSION, CERVICAL 4-5, CERVICAL 5-6, CERVICAL 6-7 WITH INSTRUMENTATION AND ALLOGRAFT;  Surgeon: Phylliss Bob, MD;  Location: [place];  Service: Orthopedics;  Laterality: N/A;   BREAST LUMPECTOMY Left 06/27/2020   Procedure: LEFT BREAST LUMPECTOMY;  Surgeon: Coralie Keens, MD;  Location: [place];  Service: General;  Laterality: Left;  LMA   DIAGNOSTIC LAPAROSCOPY     FEMUR  FRACTURE SURGERY  2008   Left   FRACTURE SURGERY     I & D EXTREMITY Left 06/01/2018   Procedure: IRRIGATION AND DEBRIDEMENT LEFT CALF;  Surgeon: Mcarthur Rossetti, MD;  Location: [place];  Service: Orthopedics;  Laterality: Left;   KNEE CARTILAGE SURGERY  2005   LAPAROSCOPIC GASTRIC BAND REMOVAL WITH LAPAROSCOPIC GASTRIC SLEEVE RESECTION N/A 05/03/2020   Procedure: LAPAROSCOPIC GASTRIC BAND REMOVAL WITH LAPAROSCOPIC GASTRIC SLEEVE RESECTION; UPPER ENDO AND ERAS PATHWAY;  Surgeon: Kinsinger, Arta Bruce, MD;  Location: [facility];  Service: General;  Laterality: N/A;   LAPAROSCOPIC GASTRIC BANDING  11/12/2011   Procedure: LAPAROSCOPIC GASTRIC BANDING;  Surgeon: Judieth Keens, DO;  Location: [facility];  Service: General;  Laterality: N/A;  nathanson liver retractor/ endostitch available 2-0 surgidac refills/ layton needle drivers available/ Allergan Doctor to proctor   TONSILLECTOMY  2007   TOTAL KNEE ARTHROPLASTY Left 10/31/2022   Procedure: TOTAL KNEE ARTHROPLASTY;  Surgeon: Willaim Sheng, MD;  Location: [facility];  Service: Orthopedics;  Laterality: Left;    Prior to Admission medications   Medication Sig Start Date End Date Taking? Authorizing Provider  calcium carbonate (TUMS - DOSED IN MG ELEMENTAL CALCIUM) 500 MG chewable tablet Chew 1 tablet by mouth 3 (three) times daily as needed for indigestion or heartburn.   Yes [provider]  escitalopram (LEXAPRO) 10 MG tablet Take 1 tablet (10 mg total) by mouth daily. 03/14/22  Yes Leamon Arnt,  MD  esomeprazole (NEXIUM) 40 MG capsule Take 40 mg by mouth daily as needed. 08/09/22 08/09/23 Yes [provider]  famotidine (PEPCID) 40 MG tablet Take 1 tablet (40 mg total) by mouth daily. 05/16/21  Yes Long, Wonda Olds, MD  hydrochlorothiazide (MICROZIDE) 12.5 MG capsule Take 1 capsule (12.5 mg total) by mouth daily. 01/04/23  Yes Leamon Arnt, MD  levothyroxine (SYNTHROID) 100 MCG tablet TAKE 1 TABLET BY MOUTH EVERY DAY BEFORE  BREAKFAST 11/13/22  Yes Leamon Arnt, MD  Multiple Vitamins-Minerals (HAIR/SKIN/NAILS) TABS Take 1 tablet by mouth daily.   Yes [provider]  pantoprazole (PROTONIX) 40 MG tablet Take 1 tablet (40 mg total) by mouth daily. Patient taking differently: Take 40 mg by mouth daily as needed. 10/08/22  Yes Leamon Arnt, MD  rOPINIRole (REQUIP) 3 MG tablet Take 1 tablet (3 mg total) by mouth at bedtime. 06/22/22  Yes Leamon Arnt, MD  rosuvastatin (CRESTOR) 40 MG tablet Take 1 tablet (40 mg total) by mouth at bedtime. 10/11/20  Yes Leamon Arnt, MD  sucralfate (CARAFATE) 1 g tablet Take 1 g by mouth daily in the afternoon. 08/27/22 08/27/23 Yes [provider]  acetaminophen (TYLENOL) 500 MG tablet Take 1,000 mg by mouth every 6 (six) hours as needed for moderate pain.    [provider]  albuterol (VENTOLIN HFA) 108 (90 Base) MCG/ACT inhaler Inhale 2 puffs into the lungs every 6 (six) hours as needed. For asthma 06/22/22   Leamon Arnt, MD  ALPRAZolam Duanne Moron) 0.25 MG tablet Take 1 tablet (0.25 mg total) by mouth at bedtime as needed for anxiety or sleep. 10/08/22   Leamon Arnt, MD  traZODone (DESYREL) 50 MG tablet Take 1-2 tablets (50-100 mg total) by mouth at bedtime as needed for sleep. 05/27/19   Leamon Arnt, MD    Current Outpatient Medications  Medication Sig Dispense Refill   calcium carbonate (TUMS - DOSED IN MG ELEMENTAL CALCIUM) 500 MG chewable tablet Chew 1 tablet by mouth 3 (three) times daily as needed for indigestion or heartburn.     escitalopram (LEXAPRO) 10 MG tablet Take 1 tablet (10 mg total) by mouth daily. 90 tablet 3   esomeprazole (NEXIUM) 40 MG capsule Take 40 mg by mouth daily as needed.     famotidine (PEPCID) 40 MG tablet Take 1 tablet (40 mg total) by mouth daily. 30 tablet 0   hydrochlorothiazide (MICROZIDE) 12.5 MG capsule Take 1 capsule (12.5 mg total) by mouth daily. 90 capsule 3   levothyroxine (SYNTHROID) 100 MCG tablet TAKE  1 TABLET BY MOUTH EVERY DAY BEFORE BREAKFAST 90 tablet 3   Multiple Vitamins-Minerals (HAIR/SKIN/NAILS) TABS Take 1 tablet by mouth daily.     pantoprazole (PROTONIX) 40 MG tablet Take 1 tablet (40 mg total) by mouth daily. (Patient taking differently: Take 40 mg by mouth daily as needed.) 90 tablet 3   rOPINIRole (REQUIP) 3 MG tablet Take 1 tablet (3 mg total) by mouth at bedtime. 90 tablet 3   rosuvastatin (CRESTOR) 40 MG tablet Take 1 tablet (40 mg total) by mouth at bedtime. 90 tablet 3   sucralfate (CARAFATE) 1 g tablet Take 1 g by mouth daily in the afternoon.     acetaminophen (TYLENOL) 500 MG tablet Take 1,000 mg by mouth every 6 (six) hours as needed for moderate pain.     albuterol (VENTOLIN HFA) 108 (90 Base) MCG/ACT inhaler Inhale 2 puffs into the lungs every 6 (six) hours as  needed. For asthma 18 g 2   ALPRAZolam (XANAX) 0.25 MG tablet Take 1 tablet (0.25 mg total) by mouth at bedtime as needed for anxiety or sleep. 20 tablet 0   traZODone (DESYREL) 50 MG tablet Take 1-2 tablets (50-100 mg total) by mouth at bedtime as needed for sleep. 180 tablet 3   Current Facility-Administered Medications  Medication Dose Route Frequency Provider Last Rate Last Admin   0.9 %  sodium chloride infusion  500 mL Intravenous Continuous Sharyn Creamer, MD        Allergies as of 01/23/2023 - Review Complete 01/23/2023  Allergen Reaction Noted   Aspirin Anaphylaxis    Cherry Hives and Shortness Of Breath 08/27/2018   Other Hives and Itching 08/27/2018   Soy allergy Hives 08/27/2018   Penicillins Itching     Family History  Problem Relation Age of Onset   Colon polyps Mother    Glaucoma Mother    Hyperthyroidism Mother    Heart disease Father    Stroke Father    Anesthesia problems Sister    Migraines Sister    Cancer Maternal Aunt        Breast Cancer   Breast cancer Maternal Aunt 37   Ovarian cancer Maternal Grandmother    Uterine cancer Maternal Grandmother    Myasthenia gravis  Paternal Grandfather    Colon cancer Neg Hx    Esophageal cancer Neg Hx    Rectal cancer Neg Hx    Stomach cancer Neg Hx     Social History   Socioeconomic History   Marital status: Single    Spouse name: Not on file   Number of children: Not on file   Years of education: Not on file   Highest education level: Not on file  Occupational History   Not on file  Tobacco Use   Smoking status: Never   Smokeless tobacco: Never  Vaping Use   Vaping Use: Never used  Substance and Sexual Activity   Alcohol use: Not Currently    Comment: rare   Drug use: No   Sexual activity: Yes  Other Topics Concern   Not on file  Social History Narrative   Not on file   Social Determinants of Health   Financial Resource Strain: Not on file  Food Insecurity: Not on file  Transportation Needs: Not on file  Physical Activity: Not on file  Stress: Not on file  Social Connections: Not on file  Intimate Partner Violence: Not on file    Physical Exam: Vital signs in last 24 hours: BP 134/86   Pulse 72   Temp (!) 97.3 F (36.3 C) (Temporal)   Ht 5' 2"$  (1.575 m)   Wt 200 lb (90.7 kg)   SpO2 98%   BMI 36.58 kg/m  GEN: NAD EYE: Sclerae anicteric ENT: MMM CV: Non-tachycardic Pulm: No increased WOB GI: Soft NEURO:  Alert & Oriented   Christia Reading, MD [facility]   01/23/2023 10:56 AM

## 2023-01-23 NOTE — Progress Notes (Signed)
Called to room to assist during endoscopic procedure.  Patient ID and intended procedure confirmed with present staff. Received instructions for my participation in the procedure from the performing physician.  

## 2023-01-24 ENCOUNTER — Telehealth: Payer: Self-pay

## 2023-01-24 NOTE — Telephone Encounter (Signed)
Attempted to reach patient for post-procedure f/u call. No answer. Left message for her to please not hesitate to call if she has any questions/concerns regarding her care.

## 2023-01-25 ENCOUNTER — Encounter: Payer: Self-pay | Admitting: Internal Medicine

## 2023-02-06 DIAGNOSIS — Z96652 Presence of left artificial knee joint: Secondary | ICD-10-CM | POA: Diagnosis not present

## 2023-02-06 DIAGNOSIS — M1711 Unilateral primary osteoarthritis, right knee: Secondary | ICD-10-CM | POA: Diagnosis not present

## 2023-02-06 DIAGNOSIS — M6281 Muscle weakness (generalized): Secondary | ICD-10-CM | POA: Diagnosis not present

## 2023-02-06 DIAGNOSIS — M1712 Unilateral primary osteoarthritis, left knee: Secondary | ICD-10-CM | POA: Diagnosis not present

## 2023-02-06 NOTE — Progress Notes (Signed)
Sent message, via epic in basket, requesting orders in epic from surgeon.  

## 2023-02-11 NOTE — Progress Notes (Signed)
Second request for pre op orders in [facility]: Left a voicemail with [employer].

## 2023-02-12 ENCOUNTER — Ambulatory Visit: Payer: Self-pay | Admitting: Physician Assistant

## 2023-02-12 DIAGNOSIS — G8929 Other chronic pain: Secondary | ICD-10-CM

## 2023-02-12 NOTE — H&P (Signed)
TOTAL KNEE ADMISSION H&P  Patient is being admitted for right total knee arthroplasty.  Subjective:  Chief Complaint:right knee pain.  HPI: Alison [name], 62 y.o. female, has a history of pain and functional disability in the right knee due to arthritis and has failed non-surgical conservative treatments for greater than 12 weeks to includeNSAID's and/or analgesics, corticosteriod injections, and activity modification.  Onset of symptoms was gradual, starting 5 years ago with gradually worsening course since that time. The patient noted no past surgery on the right knee(s).  Patient currently rates pain in the right knee(s) at 8 out of 10 with activity. Patient has night pain, worsening of pain with activity and weight bearing, pain that interferes with activities of daily living, pain with passive range of motion, crepitus, and joint swelling.  Patient has evidence of periarticular osteophytes and joint space narrowing by imaging studies. There is no active infection.  Patient Active Problem List   Diagnosis Date Noted  . Primary osteoarthritis of left knee 01/04/2023  . Status post left knee replacement 01/04/2023  . Gastroesophageal reflux disease 10/11/2020  . S/P gastric sleeve procedure 10/11/2020  . Postablative hypothyroidism 02/03/2020  . Systolic murmur 123XX123  . GAD (generalized anxiety disorder) 06/24/2018  . Mild intermittent asthma without complication 123XX123  . Mixed hyperlipidemia 06/24/2018  . Essential hypertension 05/31/2018  . Primary osteoarthritis of both knees 09/11/2016  . Gastric banding status 10/07/2015  . Restless legs syndrome (RLS) 02/14/2015  . Family history of premature CAD 12/29/2013  . Insomnia 12/29/2013  . History of TIA (transient ischemic attack) 12/10/1980   Past Medical History:  Diagnosis Date  . Anemia    prior to hysterectomy   . Anxiety   . Arthritis    LEFT KNEE  . Asthma   . Cervical myelopathy ([place]) 08/28/2018  . Family  history of adverse reaction to anesthesia    Sister PONV  . GERD (gastroesophageal reflux disease)   . Heart murmur   . Hx-TIA (transient ischemic attack) 1992  . Hyperlipidemia   . Hypertension   . Hypothyroidism   . Obesity   . Pneumonia   . Polycystic ovarian syndrome     Past Surgical History:  Procedure Laterality Date  . ABDOMINAL HYSTERECTOMY  2009   Partial  . ANTERIOR CERVICAL DECOMPRESSION/DISCECTOMY FUSION 4 LEVELS N/A 08/28/2018   Procedure: ANTERIOR CERVICAL DECOMPRESSION FUSION, CERVICAL 4-5, CERVICAL 5-6, CERVICAL 6-7 WITH INSTRUMENTATION AND ALLOGRAFT;  Surgeon: Phylliss Bob, MD;  Location: [place];  Service: Orthopedics;  Laterality: N/A;  . BREAST LUMPECTOMY Left 06/27/2020   Procedure: LEFT BREAST LUMPECTOMY;  Surgeon: Coralie Keens, MD;  Location: [place];  Service: General;  Laterality: Left;  LMA  . DIAGNOSTIC LAPAROSCOPY    . FEMUR FRACTURE SURGERY  2008   Left  . FRACTURE SURGERY    . I & D EXTREMITY Left 06/01/2018   Procedure: IRRIGATION AND DEBRIDEMENT LEFT CALF;  Surgeon: Mcarthur Rossetti, MD;  Location: [facility];  Service: Orthopedics;  Laterality: Left;  . KNEE CARTILAGE SURGERY  2005  . LAPAROSCOPIC GASTRIC BAND REMOVAL WITH LAPAROSCOPIC GASTRIC SLEEVE RESECTION N/A 05/03/2020   Procedure: LAPAROSCOPIC GASTRIC BAND REMOVAL WITH LAPAROSCOPIC GASTRIC SLEEVE RESECTION; UPPER ENDO AND ERAS PATHWAY;  Surgeon: Kinsinger, Arta Bruce, MD;  Location: [facility];  Service: General;  Laterality: N/A;  . LAPAROSCOPIC GASTRIC BANDING  11/12/2011   Procedure: LAPAROSCOPIC GASTRIC BANDING;  Surgeon: Judieth Keens, DO;  Location: [facility];  Service: General;  Laterality: N/A;  Sherrie Sport  liver retractor/ endostitch available 2-0 surgidac refills/ layton needle drivers Economist to Pensions consultant  . TONSILLECTOMY  2007  . TOTAL KNEE ARTHROPLASTY Left 10/31/2022   Procedure: TOTAL KNEE ARTHROPLASTY;  Surgeon: Willaim Sheng, MD;  Location: [facility];   Service: Orthopedics;  Laterality: Left;    Current Outpatient Medications  Medication Sig Dispense Refill Last Dose  . acetaminophen (TYLENOL) 500 MG tablet Take 1,000 mg by mouth every 6 (six) hours as needed for moderate pain.     [name] albuterol (VENTOLIN HFA) 108 (90 Base) MCG/ACT inhaler Inhale 2 puffs into the lungs every 6 (six) hours as needed. For asthma 18 g 2   . ALPRAZolam (XANAX) 0.25 MG tablet Take 1 tablet (0.25 mg total) by mouth at bedtime as needed for anxiety or sleep. 20 tablet 0   . calcium carbonate (TUMS - DOSED IN MG ELEMENTAL CALCIUM) 500 MG chewable tablet Chew 1 tablet by mouth 3 (three) times daily as needed for indigestion or heartburn.     . escitalopram (LEXAPRO) 10 MG tablet Take 1 tablet (10 mg total) by mouth daily. 90 tablet 3   . esomeprazole (NEXIUM) 40 MG capsule Take 40 mg by mouth daily as needed.     . famotidine (PEPCID) 40 MG tablet Take 1 tablet (40 mg total) by mouth daily. 30 tablet 0   . hydrochlorothiazide (MICROZIDE) 12.5 MG capsule Take 1 capsule (12.5 mg total) by mouth daily. 90 capsule 3   . levothyroxine (SYNTHROID) 100 MCG tablet TAKE 1 TABLET BY MOUTH EVERY DAY BEFORE BREAKFAST 90 tablet 3   . Multiple Vitamins-Minerals (HAIR/SKIN/NAILS) TABS Take 1 tablet by mouth daily.     . pantoprazole (PROTONIX) 40 MG tablet Take 1 tablet (40 mg total) by mouth daily. (Patient taking differently: Take 40 mg by mouth daily as needed.) 90 tablet 3   . pantoprazole (PROTONIX) 40 MG tablet 1 tablet twice daily for 8 weeks 60 tablet 2   . rOPINIRole (REQUIP) 3 MG tablet Take 1 tablet (3 mg total) by mouth at bedtime. 90 tablet 3   . rosuvastatin (CRESTOR) 40 MG tablet Take 1 tablet (40 mg total) by mouth at bedtime. 90 tablet 3   . sucralfate (CARAFATE) 1 g tablet Take 1 g by mouth daily in the afternoon.     . traZODone (DESYREL) 50 MG tablet Take 1-2 tablets (50-100 mg total) by mouth at bedtime as needed for sleep. 180 tablet 3    No current  facility-administered medications for this visit.   Allergies  Allergen Reactions  . Aspirin Anaphylaxis  . Cherry Hives and Shortness Of Breath  . Other Hives and Itching    Walnuts and pecans - cause itching and hives in mouth  . Soy Allergy Hives    " hives in mouth and wheezing"   . Penicillins Itching    Has patient had a PCN reaction causing immediate rash, facial/tongue/throat swelling, SOB or lightheadedness with hypotension: No Has patient had a PCN reaction causing severe rash involving mucus membranes or skin necrosis: No Has patient had a PCN reaction that required hospitalization: No Has patient had a PCN reaction occurring within the last 10 years: No If all of the above answers are "NO", then may proceed with Cephalosporin use.     Social History   Tobacco Use  . Smoking status: Never  . Smokeless tobacco: Never  Substance Use Topics  . Alcohol use: Not Currently    Comment: rare  Family History  Problem Relation Age of Onset  . Colon polyps Mother   . Glaucoma Mother   . Hyperthyroidism Mother   . Heart disease Father   . Stroke Father   . Anesthesia problems Sister   . Migraines Sister   . Cancer Maternal Aunt        Breast Cancer  . Breast cancer Maternal Aunt 68  . Ovarian cancer Maternal Grandmother   . Uterine cancer Maternal Grandmother   . Myasthenia gravis Paternal Grandfather   . Colon cancer Neg Hx   . Esophageal cancer Neg Hx   . Rectal cancer Neg Hx   . Stomach cancer Neg Hx      Review of Systems  Musculoskeletal:  Positive for arthralgias.  All other systems reviewed and are negative.  Objective:  Physical Exam Constitutional:      Appearance: Normal appearance.  HENT:     Head: Normocephalic and atraumatic.  Eyes:     Extraocular Movements: Extraocular movements intact.     Pupils: Pupils are equal, round, and reactive to light.  Cardiovascular:     Rate and Rhythm: Normal rate and regular rhythm.     Pulses: Normal  pulses.     Heart sounds: Murmur heard.  Pulmonary:     Effort: Pulmonary effort is normal. No respiratory distress.     Breath sounds: Normal breath sounds. No wheezing.  Abdominal:     General: Abdomen is flat. Bowel sounds are normal. There is no distension.     Palpations: Abdomen is soft.     Tenderness: There is no abdominal tenderness.  Musculoskeletal:     Cervical back: Normal range of motion and neck supple.     Comments: examination of the right lower extremity shows she is neurovascularly intact.  She has no calf tenderness to palpation.  Intact dorsiflexion and plantarflexion of the ankle.  Range of motion with the right knee is 0 to 110 degrees.    Lymphadenopathy:     Cervical: No cervical adenopathy.  Skin:    General: Skin is warm and dry.     Findings: No erythema or rash.  Neurological:     General: No focal deficit present.     Mental Status: She is alert and oriented to person, place, and time.  Psychiatric:        Mood and Affect: Mood normal.        Behavior: Behavior normal.   Vital signs in last 24 hours: '@VSRANGES'$ @  Labs:   Estimated body mass index is 36.58 kg/m as calculated from the following:   Height as of 01/23/23: '5\' 2"'$  (1.575 m).   Weight as of 01/23/23: 90.7 kg.   Imaging Review Plain radiographs demonstrate moderate degenerative joint disease of the right knee(s). The overall alignment ismild valgus. The bone quality appears to be good for age and reported activity level.      Assessment/Plan:  End stage arthritis, right knee   The patient history, physical examination, clinical judgment of the provider and imaging studies are consistent with end stage degenerative joint disease of the right knee(s) and total knee arthroplasty is deemed medically necessary. The treatment options including medical management, injection therapy arthroscopy and arthroplasty were discussed at length. The risks and benefits of total knee arthroplasty were  presented and reviewed. The risks due to aseptic loosening, infection, stiffness, patella tracking problems, thromboembolic complications and other imponderables were discussed. The patient acknowledged the explanation, agreed to proceed with the plan and  consent was signed. Patient is being admitted for inpatient treatment for surgery, pain control, PT, OT, prophylactic antibiotics, VTE prophylaxis, progressive ambulation and ADL's and discharge planning. The patient is planning to be discharged  home with outpt PT     Patient's anticipated LOS is less than 2 midnights, meeting these requirements: - Younger than 71 - Lives within 1 hour of care - Has a competent adult at home to recover with post-op recover - NO history of  - Chronic pain requiring opiods  - Diabetes  - Coronary Artery Disease  - Heart failure  - Heart attack  - Stroke  - DVT/VTE  - Cardiac arrhythmia  - Respiratory Failure/COPD  - Renal failure  - Anemia  - Advanced Liver disease

## 2023-02-12 NOTE — H&P (View-Only) (Signed)
TOTAL KNEE ADMISSION H&P  Patient is being admitted for right total knee arthroplasty.  Subjective:  Chief Complaint:right knee pain.  HPI: Alison [name], 61 y.o. female, has a history of pain and functional disability in the right knee due to arthritis and has failed non-surgical conservative treatments for greater than 12 weeks to includeNSAID's and/or analgesics, corticosteriod injections, and activity modification.  Onset of symptoms was gradual, starting 5 years ago with gradually worsening course since that time. The patient noted no past surgery on the right knee(s).  Patient currently rates pain in the right knee(s) at 8 out of 10 with activity. Patient has night pain, worsening of pain with activity and weight bearing, pain that interferes with activities of daily living, pain with passive range of motion, crepitus, and joint swelling.  Patient has evidence of periarticular osteophytes and joint space narrowing by imaging studies. There is no active infection.  Patient Active Problem List   Diagnosis Date Noted  . Primary osteoarthritis of left knee 01/04/2023  . Status post left knee replacement 01/04/2023  . Gastroesophageal reflux disease 10/11/2020  . S/P gastric sleeve procedure 10/11/2020  . Postablative hypothyroidism 02/03/2020  . Systolic murmur 09/29/2019  . GAD (generalized anxiety disorder) 06/24/2018  . Mild intermittent asthma without complication 06/24/2018  . Mixed hyperlipidemia 06/24/2018  . Essential hypertension 05/31/2018  . Primary osteoarthritis of both knees 09/11/2016  . Gastric banding status 10/07/2015  . Restless legs syndrome (RLS) 02/14/2015  . Family history of premature CAD 12/29/2013  . Insomnia 12/29/2013  . History of TIA (transient ischemic attack) 12/10/1980   Past Medical History:  Diagnosis Date  . Anemia    prior to hysterectomy   . Anxiety   . Arthritis    LEFT KNEE  . Asthma   . Cervical myelopathy ([facility]) 08/28/2018  . Family  history of adverse reaction to anesthesia    Sister PONV  . GERD (gastroesophageal reflux disease)   . Heart murmur   . Hx-TIA (transient ischemic attack) 1992  . Hyperlipidemia   . Hypertension   . Hypothyroidism   . Obesity   . Pneumonia   . Polycystic ovarian syndrome     Past Surgical History:  Procedure Laterality Date  . ABDOMINAL HYSTERECTOMY  2009   Partial  . ANTERIOR CERVICAL DECOMPRESSION/DISCECTOMY FUSION 4 LEVELS N/A 08/28/2018   Procedure: ANTERIOR CERVICAL DECOMPRESSION FUSION, CERVICAL 4-5, CERVICAL 5-6, CERVICAL 6-7 WITH INSTRUMENTATION AND ALLOGRAFT;  Surgeon: Dumonski, Mark, MD;  Location: [facility];  Service: Orthopedics;  Laterality: N/A;  . BREAST LUMPECTOMY Left 06/27/2020   Procedure: LEFT BREAST LUMPECTOMY;  Surgeon: Blackman, Douglas, MD;  Location: [facility];  Service: General;  Laterality: Left;  LMA  . DIAGNOSTIC LAPAROSCOPY    . FEMUR FRACTURE SURGERY  2008   Left  . FRACTURE SURGERY    . I & D EXTREMITY Left 06/01/2018   Procedure: IRRIGATION AND DEBRIDEMENT LEFT CALF;  Surgeon: Blackman, Christopher Y, MD;  Location: [facility];  Service: Orthopedics;  Laterality: Left;  . KNEE CARTILAGE SURGERY  2005  . LAPAROSCOPIC GASTRIC BAND REMOVAL WITH LAPAROSCOPIC GASTRIC SLEEVE RESECTION N/A 05/03/2020   Procedure: LAPAROSCOPIC GASTRIC BAND REMOVAL WITH LAPAROSCOPIC GASTRIC SLEEVE RESECTION; UPPER ENDO AND ERAS PATHWAY;  Surgeon: Kinsinger, Luke Aaron, MD;  [facility];  Service: General;  Laterality: N/A;  . LAPAROSCOPIC GASTRIC BANDING  11/12/2011   Procedure: LAPAROSCOPIC GASTRIC BANDING;  Surgeon: Brian David Layton, DO;  [facility];  Service: General;  Laterality: N/A;  nathanson   liver retractor/ endostitch available 2-0 surgidac refills/ layton needle drivers available/ [employer] Doctor to proctor  . TONSILLECTOMY  2007  . TOTAL KNEE ARTHROPLASTY Left 10/31/2022   Procedure: TOTAL KNEE ARTHROPLASTY;  Surgeon: Marchwiany, Daniel A, MD;  [facility];   Service: Orthopedics;  Laterality: Left;    Current Outpatient Medications  Medication Sig Dispense Refill Last Dose  . acetaminophen (TYLENOL) 500 MG tablet Take 1,000 mg by mouth every 6 (six) hours as needed for moderate pain.     . albuterol (VENTOLIN HFA) 108 (90 Base) MCG/ACT inhaler Inhale 2 puffs into the lungs every 6 (six) hours as needed. For asthma 18 g 2   . ALPRAZolam (XANAX) 0.25 MG tablet Take 1 tablet (0.25 mg total) by mouth at bedtime as needed for anxiety or sleep. 20 tablet 0   . calcium carbonate (TUMS - DOSED IN MG ELEMENTAL CALCIUM) 500 MG chewable tablet Chew 1 tablet by mouth 3 (three) times daily as needed for indigestion or heartburn.     . escitalopram (LEXAPRO) 10 MG tablet Take 1 tablet (10 mg total) by mouth daily. 90 tablet 3   . esomeprazole (NEXIUM) 40 MG capsule Take 40 mg by mouth daily as needed.     . famotidine (PEPCID) 40 MG tablet Take 1 tablet (40 mg total) by mouth daily. 30 tablet 0   . hydrochlorothiazide (MICROZIDE) 12.5 MG capsule Take 1 capsule (12.5 mg total) by mouth daily. 90 capsule 3   . levothyroxine (SYNTHROID) 100 MCG tablet TAKE 1 TABLET BY MOUTH EVERY DAY BEFORE BREAKFAST 90 tablet 3   . Multiple Vitamins-Minerals (HAIR/SKIN/NAILS) TABS Take 1 tablet by mouth daily.     . pantoprazole (PROTONIX) 40 MG tablet Take 1 tablet (40 mg total) by mouth daily. (Patient taking differently: Take 40 mg by mouth daily as needed.) 90 tablet 3   . pantoprazole (PROTONIX) 40 MG tablet 1 tablet twice daily for 8 weeks 60 tablet 2   . rOPINIRole (REQUIP) 3 MG tablet Take 1 tablet (3 mg total) by mouth at bedtime. 90 tablet 3   . rosuvastatin (CRESTOR) 40 MG tablet Take 1 tablet (40 mg total) by mouth at bedtime. 90 tablet 3   . sucralfate (CARAFATE) 1 g tablet Take 1 g by mouth daily in the afternoon.     . traZODone (DESYREL) 50 MG tablet Take 1-2 tablets (50-100 mg total) by mouth at bedtime as needed for sleep. 180 tablet 3    No current  facility-administered medications for this visit.   Allergies  Allergen Reactions  . Aspirin Anaphylaxis  . Cherry Hives and Shortness Of Breath  . Other Hives and Itching    Walnuts and pecans - cause itching and hives in mouth  . Soy Allergy Hives    " hives in mouth and wheezing"   . Penicillins Itching    Has patient had a PCN reaction causing immediate rash, facial/tongue/throat swelling, SOB or lightheadedness with hypotension: No Has patient had a PCN reaction causing severe rash involving mucus membranes or skin necrosis: No Has patient had a PCN reaction that required hospitalization: No Has patient had a PCN reaction occurring within the last 10 years: No If all of the above answers are "NO", then may proceed with Cephalosporin use.     Social History   Tobacco Use  . Smoking status: Never  . Smokeless tobacco: Never  Substance Use Topics  . Alcohol use: Not Currently    Comment: rare      Family History  Problem Relation Age of Onset  . Colon polyps Mother   . Glaucoma Mother   . Hyperthyroidism Mother   . Heart disease Father   . Stroke Father   . Anesthesia problems Sister   . Migraines Sister   . Cancer Maternal Aunt        Breast Cancer  . Breast cancer Maternal Aunt 37  . Ovarian cancer Maternal Grandmother   . Uterine cancer Maternal Grandmother   . Myasthenia gravis Paternal Grandfather   . Colon cancer Neg Hx   . Esophageal cancer Neg Hx   . Rectal cancer Neg Hx   . Stomach cancer Neg Hx      Review of Systems  Musculoskeletal:  Positive for arthralgias.  All other systems reviewed and are negative.  Objective:  Physical Exam Constitutional:      Appearance: Normal appearance.  HENT:     Head: Normocephalic and atraumatic.  Eyes:     Extraocular Movements: Extraocular movements intact.     Pupils: Pupils are equal, round, and reactive to light.  Cardiovascular:     Rate and Rhythm: Normal rate and regular rhythm.     Pulses: Normal  pulses.     Heart sounds: Murmur heard.  Pulmonary:     Effort: Pulmonary effort is normal. No respiratory distress.     Breath sounds: Normal breath sounds. No wheezing.  Abdominal:     General: Abdomen is flat. Bowel sounds are normal. There is no distension.     Palpations: Abdomen is soft.     Tenderness: There is no abdominal tenderness.  Musculoskeletal:     Cervical back: Normal range of motion and neck supple.     Comments: examination of the right lower extremity shows she is neurovascularly intact.  She has no calf tenderness to palpation.  Intact dorsiflexion and plantarflexion of the ankle.  Range of motion with the right knee is 0 to 110 degrees.    Lymphadenopathy:     Cervical: No cervical adenopathy.  Skin:    General: Skin is warm and dry.     Findings: No erythema or rash.  Neurological:     General: No focal deficit present.     Mental Status: She is alert and oriented to person, place, and time.  Psychiatric:        Mood and Affect: Mood normal.        Behavior: Behavior normal.   Vital signs in last 24 hours: @VSRANGES@  Labs:   Estimated body mass index is 36.58 kg/m as calculated from the following:   Height as of 01/23/23: 5' 2" (1.575 m).   Weight as of 01/23/23: 90.7 kg.   Imaging Review Plain radiographs demonstrate moderate degenerative joint disease of the right knee(s). The overall alignment ismild valgus. The bone quality appears to be good for age and reported activity level.      Assessment/Plan:  End stage arthritis, right knee   The patient history, physical examination, clinical judgment of the provider and imaging studies are consistent with end stage degenerative joint disease of the right knee(s) and total knee arthroplasty is deemed medically necessary. The treatment options including medical management, injection therapy arthroscopy and arthroplasty were discussed at length. The risks and benefits of total knee arthroplasty were  presented and reviewed. The risks due to aseptic loosening, infection, stiffness, patella tracking problems, thromboembolic complications and other imponderables were discussed. The patient acknowledged the explanation, agreed to proceed with the plan and   consent was signed. Patient is being admitted for inpatient treatment for surgery, pain control, PT, OT, prophylactic antibiotics, VTE prophylaxis, progressive ambulation and ADL's and discharge planning. The patient is planning to be discharged  home with outpt PT     Patient's anticipated LOS is less than 2 midnights, meeting these requirements: - Younger than 65 - Lives within 1 hour of care - Has a competent adult at home to recover with post-op recover - NO history of  - Chronic pain requiring opiods  - Diabetes  - Coronary Artery Disease  - Heart failure  - Heart attack  - Stroke  - DVT/VTE  - Cardiac arrhythmia  - Respiratory Failure/COPD  - Renal failure  - Anemia  - Advanced Liver disease   

## 2023-02-12 NOTE — Patient Instructions (Signed)
SURGICAL WAITING ROOM VISITATION  Patients having surgery or a procedure may have no more than 2 support people in the waiting area - these visitors may rotate.    Children under the age of 25 must have an adult with them who is not the patient.  Due to an increase in RSV and influenza rates and associated hospitalizations, children ages 52 and under may not visit patients in [place].  If the patient needs to stay at the hospital during part of their recovery, the visitor guidelines for inpatient rooms apply. Pre-op nurse will coordinate an appropriate time for 1 support person to accompany patient in pre-op.  This support person may not rotate.    Please refer to the [facility] website for the visitor guidelines for Inpatients (after your surgery is over and you are in a regular room).       Your procedure is scheduled on:  02/25/2023    Report to [facility] Main Entrance    Report to admitting at  [place] AM   Call this number if you have problems the morning of surgery [phone]   Do not eat food :After Midnight.   After Midnight you may have the following liquids until ___ 0545___ AM  DAY OF SURGERY  Water Non-Citrus Juices (without pulp, NO RED-Apple, White grape, White cranberry) Black Coffee (NO MILK/CREAM OR CREAMERS, sugar ok)  Clear Tea (NO MILK/CREAM OR CREAMERS, sugar ok) regular and decaf                             Plain Jell-O (NO RED)                                           Fruit ices (not with fruit pulp, NO RED)                                     Popsicles (NO RED)                                                               Sports drinks like Gatorade (NO RED)                    The day of surgery:  Drink ONE (1) Pre-Surgery Clear Ensure or G2 at  0545 AM  ( have completed by ) the morning of surgery. Drink in one sitting. Do not sip.  This drink was given to you during your hospital  pre-op appointment visit. Nothing else to  drink after completing the  Pre-Surgery Clear Ensure or G2.          If you have questions, please contact your surgeon's office.       Oral Hygiene is also important to reduce your risk of infection.                                    Remember - BRUSH YOUR TEETH THE MORNING OF SURGERY  WITH YOUR REGULAR TOOTHPASTE  DENTURES WILL BE REMOVED PRIOR TO SURGERY PLEASE DO NOT APPLY "Poly grip" OR ADHESIVES!!!   Do NOT smoke after Midnight   Take these medicines the morning of surgery with A SIP OF WATER:  inhalers as usual and bring, lexapro, synthroid, pepcid   DO NOT TAKE ANY ORAL DIABETIC MEDICATIONS DAY OF YOUR SURGERY  Bring CPAP mask and tubing day of surgery.                              You may not have any metal on your body including hair pins, jewelry, and body piercing             Do not wear make-up, lotions, powders, perfumes/cologne, or deodorant  Do not wear nail polish including gel and S&S, artificial/acrylic nails, or any other type of covering on natural nails including finger and toenails. If you have artificial nails, gel coating, etc. that needs to be removed by a nail salon please have this removed prior to surgery or surgery may need to be canceled/ delayed if the surgeon/ anesthesia feels like they are unable to be safely monitored.   Do not shave  48 hours prior to surgery.               Men may shave face and neck.   Do not bring valuables to the hospital. [place].   Contacts, glasses, dentures or bridgework may not be worn into surgery.   Bring small overnight bag day of surgery.   DO NOT [place]. PHARMACY WILL DISPENSE MEDICATIONS LISTED ON YOUR MEDICATION LIST TO YOU DURING YOUR ADMISSION [place]!    Patients discharged on the day of surgery will not be allowed to drive home.  Someone NEEDS to stay with you for the first 24 hours after  anesthesia.   Special Instructions: Bring a copy of your healthcare power of attorney and living will documents the day of surgery if you haven't scanned them before.              Please read over the following fact sheets you were given: IF [place] [phone]   If you received a COVID test during your pre-op visit  it is requested that you wear a mask when out in public, stay away from anyone that may not be feeling well and notify your surgeon if you develop symptoms. If you test positive for Covid or have been in contact with anyone that has tested positive in the last 10 days please notify you surgeon.    [name] - Preparing for Surgery Before surgery, you can play an important role.  Because skin is not sterile, your skin needs to be as free of germs as possible.  You can reduce the number of germs on your skin by washing with CHG (chlorahexidine gluconate) soap before surgery.  CHG is an antiseptic cleaner which kills germs and bonds with the skin to continue killing germs even after washing. Please DO NOT use if you have an allergy to CHG or antibacterial soaps.  If your skin becomes reddened/irritated stop using the CHG and inform your nurse when you arrive at Short Stay. Do not shave (including legs and underarms) for at least  48 hours prior to the first CHG shower.  You may shave your face/neck. Please follow these instructions carefully:  1.  Shower with CHG Soap the night before surgery and the  morning of Surgery.  2.  If you choose to wash your hair, wash your hair first as usual with your  normal  shampoo.  3.  After you shampoo, rinse your hair and body thoroughly to remove the  shampoo.                           4.  Use CHG as you would any other liquid soap.  You can apply chg directly  to the skin and wash                       Gently with a scrungie or clean washcloth.  5.  Apply the CHG Soap to your body ONLY FROM THE  NECK DOWN.   Do not use on face/ open                           Wound or open sores. Avoid contact with eyes, ears mouth and genitals (private parts).                       Wash face,  Genitals (private parts) with your normal soap.             6.  Wash thoroughly, paying special attention to the area where your surgery  will be performed.  7.  Thoroughly rinse your body with warm water from the neck down.  8.  DO NOT shower/wash with your normal soap after using and rinsing off  the CHG Soap.                9.  Pat yourself dry with a clean towel.            10.  Wear clean pajamas.            11.  Place clean sheets on your bed the night of your first shower and do not  sleep with pets. Day of Surgery : Do not apply any lotions/deodorants the morning of surgery.  Please wear clean clothes to the hospital/surgery center.  FAILURE TO FOLLOW THESE INSTRUCTIONS MAY RESULT IN THE CANCELLATION OF YOUR SURGERY PATIENT SIGNATURE_________________________________  NURSE SIGNATURE__________________________________  ________________________________________________________________________

## 2023-02-12 NOTE — Progress Notes (Signed)
Anesthesia Review:  PCP: Cardiologist : Chest x-ray : EKG : 10/24/22  Echo : Stress test: Cardiac Cath :  Activity level:  Sleep Study/ CPAP : Fasting Blood Sugar :      / Checks Blood Sugar -- times a day:   Blood Thinner/ Instructions /Last Dose: ASA / Instructions/ Last Dose :

## 2023-02-13 ENCOUNTER — Encounter (HOSPITAL_COMMUNITY): Payer: Self-pay

## 2023-02-13 ENCOUNTER — Other Ambulatory Visit: Payer: Self-pay

## 2023-02-13 ENCOUNTER — Encounter (HOSPITAL_COMMUNITY)
Admission: RE | Admit: 2023-02-13 | Discharge: 2023-02-13 | Disposition: A | Discharge status: Home / Self Care | Payer: 59 | Source: Ambulatory Visit | Attending: Orthopedic Surgery | Admitting: Orthopedic Surgery

## 2023-02-13 VITALS — BP 140/99 | HR 62 | Temp 98.2°F | Resp 16 | Ht 62.0 in | Wt 196.0 lb

## 2023-02-13 DIAGNOSIS — Z01818 Encounter for other preprocedural examination: Secondary | ICD-10-CM

## 2023-02-13 DIAGNOSIS — G8929 Other chronic pain: Secondary | ICD-10-CM | POA: Diagnosis not present

## 2023-02-13 DIAGNOSIS — Z01812 Encounter for preprocedural laboratory examination: Secondary | ICD-10-CM | POA: Insufficient documentation

## 2023-02-13 DIAGNOSIS — M25561 Pain in right knee: Secondary | ICD-10-CM | POA: Diagnosis not present

## 2023-02-13 HISTORY — DX: Cerebral infarction, unspecified: I63.9

## 2023-02-13 LAB — CBC WITH DIFFERENTIAL/PLATELET
Abs Immature Granulocytes: 0.09 10*3/uL — ABNORMAL HIGH (ref 0.00–0.07)
Basophils Absolute: 0 10*3/uL (ref 0.0–0.1)
Basophils Relative: 1 %
Eosinophils Absolute: 0.2 10*3/uL (ref 0.0–0.5)
Eosinophils Relative: 3 %
HCT: 44.4 % (ref 36.0–46.0)
Hemoglobin: 14.7 g/dL (ref 12.0–15.0)
Immature Granulocytes: 1 %
Lymphocytes Relative: 32 %
Lymphs Abs: 2.2 10*3/uL (ref 0.7–4.0)
MCH: 30.9 pg (ref 26.0–34.0)
MCHC: 33.1 g/dL (ref 30.0–36.0)
MCV: 93.5 fL (ref 80.0–100.0)
Monocytes Absolute: 0.5 10*3/uL (ref 0.1–1.0)
Monocytes Relative: 7 %
Neutro Abs: 3.9 10*3/uL (ref 1.7–7.7)
Neutrophils Relative %: 56 %
Platelets: 248 10*3/uL (ref 150–400)
RBC: 4.75 MIL/uL (ref 3.87–5.11)
RDW: 12.2 % (ref 11.5–15.5)
WBC: 6.9 10*3/uL (ref 4.0–10.5)
nRBC: 0 % (ref 0.0–0.2)

## 2023-02-13 LAB — COMPREHENSIVE METABOLIC PANEL WITH GFR
ALT: 17 U/L (ref 0–44)
AST: 18 U/L (ref 15–41)
Albumin: 3.9 g/dL (ref 3.5–5.0)
Alkaline Phosphatase: 84 U/L (ref 38–126)
Anion gap: 7 (ref 5–15)
BUN: 13 mg/dL (ref 6–20)
CO2: 25 mmol/L (ref 22–32)
Calcium: 9.7 mg/dL (ref 8.9–10.3)
Chloride: 105 mmol/L (ref 98–111)
Creatinine, Ser: 0.71 mg/dL (ref 0.44–1.00)
GFR, Estimated: >60 mL/min
Glucose, Bld: 76 mg/dL (ref 70–99)
Potassium: 4 mmol/L (ref 3.5–5.1)
Sodium: 137 mmol/L (ref 135–145)
Total Bilirubin: 0.5 mg/dL (ref 0.3–1.2)
Total Protein: 7.1 g/dL (ref 6.5–8.1)

## 2023-02-13 LAB — SURGICAL PCR SCREEN
MRSA, PCR: NEGATIVE
Staphylococcus aureus: NEGATIVE

## 2023-02-25 ENCOUNTER — Ambulatory Visit (HOSPITAL_COMMUNITY)
Admission: RE | Admit: 2023-02-25 | Discharge: 2023-02-25 | Disposition: A | Discharge status: Home / Self Care | Payer: 59 | Attending: Orthopedic Surgery | Admitting: Orthopedic Surgery

## 2023-02-25 ENCOUNTER — Encounter (HOSPITAL_COMMUNITY)
Admission: RE | Disposition: A | Discharge status: Home / Self Care | Payer: Self-pay | Source: Home / Self Care | Attending: Orthopedic Surgery

## 2023-02-25 ENCOUNTER — Encounter (HOSPITAL_COMMUNITY): Payer: Self-pay | Admitting: Orthopedic Surgery

## 2023-02-25 ENCOUNTER — Other Ambulatory Visit: Payer: Self-pay

## 2023-02-25 ENCOUNTER — Ambulatory Visit (HOSPITAL_BASED_OUTPATIENT_CLINIC_OR_DEPARTMENT_OTHER): Payer: 59 | Admitting: Anesthesiology

## 2023-02-25 ENCOUNTER — Ambulatory Visit (HOSPITAL_COMMUNITY): Payer: 59

## 2023-02-25 ENCOUNTER — Ambulatory Visit (HOSPITAL_COMMUNITY): Payer: 59 | Admitting: Physician Assistant

## 2023-02-25 DIAGNOSIS — Z8349 Family history of other endocrine, nutritional and metabolic diseases: Secondary | ICD-10-CM | POA: Insufficient documentation

## 2023-02-25 DIAGNOSIS — Z6835 Body mass index (BMI) 35.0-35.9, adult: Secondary | ICD-10-CM | POA: Diagnosis not present

## 2023-02-25 DIAGNOSIS — I10 Essential (primary) hypertension: Secondary | ICD-10-CM | POA: Diagnosis not present

## 2023-02-25 DIAGNOSIS — F419 Anxiety disorder, unspecified: Secondary | ICD-10-CM | POA: Diagnosis not present

## 2023-02-25 DIAGNOSIS — E890 Postprocedural hypothyroidism: Secondary | ICD-10-CM | POA: Insufficient documentation

## 2023-02-25 DIAGNOSIS — K219 Gastro-esophageal reflux disease without esophagitis: Secondary | ICD-10-CM | POA: Diagnosis not present

## 2023-02-25 DIAGNOSIS — J4520 Mild intermittent asthma, uncomplicated: Secondary | ICD-10-CM | POA: Insufficient documentation

## 2023-02-25 DIAGNOSIS — E039 Hypothyroidism, unspecified: Secondary | ICD-10-CM | POA: Diagnosis not present

## 2023-02-25 DIAGNOSIS — E669 Obesity, unspecified: Secondary | ICD-10-CM | POA: Insufficient documentation

## 2023-02-25 DIAGNOSIS — J45909 Unspecified asthma, uncomplicated: Secondary | ICD-10-CM

## 2023-02-25 DIAGNOSIS — Z79899 Other long term (current) drug therapy: Secondary | ICD-10-CM | POA: Diagnosis not present

## 2023-02-25 DIAGNOSIS — M1711 Unilateral primary osteoarthritis, right knee: Secondary | ICD-10-CM | POA: Insufficient documentation

## 2023-02-25 DIAGNOSIS — K449 Diaphragmatic hernia without obstruction or gangrene: Secondary | ICD-10-CM | POA: Insufficient documentation

## 2023-02-25 DIAGNOSIS — Z471 Aftercare following joint replacement surgery: Secondary | ICD-10-CM | POA: Diagnosis not present

## 2023-02-25 DIAGNOSIS — Z01818 Encounter for other preprocedural examination: Secondary | ICD-10-CM

## 2023-02-25 DIAGNOSIS — Z8249 Family history of ischemic heart disease and other diseases of the circulatory system: Secondary | ICD-10-CM | POA: Diagnosis not present

## 2023-02-25 DIAGNOSIS — Z8673 Personal history of transient ischemic attack (TIA), and cerebral infarction without residual deficits: Secondary | ICD-10-CM | POA: Diagnosis not present

## 2023-02-25 DIAGNOSIS — G8929 Other chronic pain: Secondary | ICD-10-CM

## 2023-02-25 DIAGNOSIS — Z96651 Presence of right artificial knee joint: Secondary | ICD-10-CM | POA: Diagnosis not present

## 2023-02-25 HISTORY — DX: Diaphragmatic hernia without obstruction or gangrene: K44.9

## 2023-02-25 HISTORY — PX: TOTAL KNEE ARTHROPLASTY: SHX125

## 2023-02-25 SURGERY — ARTHROPLASTY, KNEE, TOTAL
Anesthesia: General | Site: Knee | Laterality: Right

## 2023-02-25 MED ORDER — FENTANYL CITRATE PF 50 MCG/ML IJ SOSY
PREFILLED_SYRINGE | INTRAMUSCULAR | Status: AC
  Filled 2023-02-25: qty 1

## 2023-02-25 MED ORDER — SODIUM CHLORIDE (PF) 0.9 % IJ SOLN
INTRAMUSCULAR | Status: AC
  Filled 2023-02-25: qty 50

## 2023-02-25 MED ORDER — PROPOFOL 10 MG/ML IV BOLUS
INTRAVENOUS | Status: DC | PRN
  Administered 2023-02-25: 100 ug/kg/min via INTRAVENOUS
  Administered 2023-02-25: 160 mg via INTRAVENOUS

## 2023-02-25 MED ORDER — 0.9 % SODIUM CHLORIDE (POUR BTL) OPTIME
TOPICAL | Status: DC | PRN
  Administered 2023-02-25: 1000 mL

## 2023-02-25 MED ORDER — OXYCODONE HCL 5 MG PO TABS
ORAL_TABLET | ORAL | Status: AC
  Filled 2023-02-25: qty 1

## 2023-02-25 MED ORDER — SODIUM CHLORIDE 0.9 % IR SOLN
Status: DC | PRN
  Administered 2023-02-25: 3000 mL

## 2023-02-25 MED ORDER — KETAMINE HCL 10 MG/ML IJ SOLN
INTRAMUSCULAR | Status: DC | PRN
  Administered 2023-02-25: 30 mg via INTRAVENOUS

## 2023-02-25 MED ORDER — ACETAMINOPHEN 500 MG PO TABS
1000.0000 mg | ORAL_TABLET | Freq: Once | ORAL | Status: AC
  Administered 2023-02-25: 1000 mg via ORAL
  Filled 2023-02-25: qty 2

## 2023-02-25 MED ORDER — FENTANYL CITRATE PF 50 MCG/ML IJ SOSY
PREFILLED_SYRINGE | INTRAMUSCULAR | Status: AC
  Filled 2023-02-25: qty 2

## 2023-02-25 MED ORDER — LACTATED RINGERS IV BOLUS: 250.0000 mL | Freq: Once | INTRAVENOUS | Status: DC

## 2023-02-25 MED ORDER — BUPIVACAINE LIPOSOME 1.3 % IJ SUSP
INTRAMUSCULAR | Status: AC
  Filled 2023-02-25: qty 20

## 2023-02-25 MED ORDER — DEXAMETHASONE SODIUM PHOSPHATE 10 MG/ML IJ SOLN
INTRAMUSCULAR | Status: DC | PRN
  Administered 2023-02-25 (×2): 10 mg

## 2023-02-25 MED ORDER — METHOCARBAMOL 750 MG PO TABS: 750.0000 mg | ORAL_TABLET | Freq: Three times a day (TID) | ORAL | 0 refills | Status: AC | PRN

## 2023-02-25 MED ORDER — SUCCINYLCHOLINE CHLORIDE 200 MG/10ML IV SOSY
PREFILLED_SYRINGE | INTRAVENOUS | Status: DC | PRN
  Administered 2023-02-25: 120 mg via INTRAVENOUS

## 2023-02-25 MED ORDER — FENTANYL CITRATE PF 50 MCG/ML IJ SOSY
50.0000 ug | PREFILLED_SYRINGE | Freq: Once | INTRAMUSCULAR | Status: AC
  Administered 2023-02-25: 50 ug via INTRAVENOUS
  Filled 2023-02-25: qty 2

## 2023-02-25 MED ORDER — BUPIVACAINE LIPOSOME 1.3 % IJ SUSP
INTRAMUSCULAR | Status: DC | PRN
  Administered 2023-02-25: 20 mL

## 2023-02-25 MED ORDER — LIDOCAINE HCL (CARDIAC) PF 100 MG/5ML IV SOSY
PREFILLED_SYRINGE | INTRAVENOUS | Status: DC | PRN
  Administered 2023-02-25: 100 mg via INTRAVENOUS

## 2023-02-25 MED ORDER — PROPOFOL 10 MG/ML IV BOLUS
INTRAVENOUS | Status: AC
  Filled 2023-02-25: qty 20

## 2023-02-25 MED ORDER — OXYCODONE HCL 5 MG PO TABS: 5.0000 mg | ORAL_TABLET | ORAL | 0 refills | Status: AC | PRN

## 2023-02-25 MED ORDER — SODIUM CHLORIDE 0.9 % IV SOLN: INTRAVENOUS | Status: DC

## 2023-02-25 MED ORDER — METHOCARBAMOL 500 MG IVPB - SIMPLE MED
500.0000 mg | Freq: Four times a day (QID) | INTRAVENOUS | Status: DC | PRN
  Administered 2023-02-25: 500 mg via INTRAVENOUS

## 2023-02-25 MED ORDER — KETAMINE HCL 10 MG/ML IJ SOLN
INTRAMUSCULAR | Status: AC
  Filled 2023-02-25: qty 1

## 2023-02-25 MED ORDER — CEFAZOLIN SODIUM-DEXTROSE 2-4 GM/100ML-% IV SOLN
2.0000 g | INTRAVENOUS | Status: AC
  Administered 2023-02-25: 2 g via INTRAVENOUS
  Filled 2023-02-25: qty 100

## 2023-02-25 MED ORDER — FENTANYL CITRATE PF 50 MCG/ML IJ SOSY
25.0000 ug | PREFILLED_SYRINGE | INTRAMUSCULAR | Status: DC | PRN
  Administered 2023-02-25 (×3): 50 ug via INTRAVENOUS

## 2023-02-25 MED ORDER — SODIUM CHLORIDE (PF) 0.9 % IJ SOLN
INTRAMUSCULAR | Status: AC
  Filled 2023-02-25: qty 10

## 2023-02-25 MED ORDER — ROCURONIUM BROMIDE 10 MG/ML (PF) SYRINGE
PREFILLED_SYRINGE | INTRAVENOUS | Status: AC
  Filled 2023-02-25: qty 10

## 2023-02-25 MED ORDER — FENTANYL CITRATE (PF) 100 MCG/2ML IJ SOLN
INTRAMUSCULAR | Status: DC | PRN
  Administered 2023-02-25: 100 ug via INTRAVENOUS
  Administered 2023-02-25: 50 ug via INTRAVENOUS

## 2023-02-25 MED ORDER — POVIDONE-IODINE 10 % EX SWAB
2.0000 | Freq: Once | CUTANEOUS | Status: AC
  Administered 2023-02-25: 2 via TOPICAL

## 2023-02-25 MED ORDER — BUPIVACAINE LIPOSOME 1.3 % IJ SUSP: 20.0000 mL | Freq: Once | INTRAMUSCULAR | Status: DC

## 2023-02-25 MED ORDER — LIDOCAINE HCL (PF) 2 % IJ SOLN
INTRAMUSCULAR | Status: AC
  Filled 2023-02-25: qty 5

## 2023-02-25 MED ORDER — METHOCARBAMOL 500 MG IVPB - SIMPLE MED
INTRAVENOUS | Status: AC
  Filled 2023-02-25: qty 55

## 2023-02-25 MED ORDER — FENTANYL CITRATE (PF) 100 MCG/2ML IJ SOLN
INTRAMUSCULAR | Status: AC
  Filled 2023-02-25: qty 2

## 2023-02-25 MED ORDER — PROMETHAZINE HCL 25 MG/ML IJ SOLN: 6.2500 mg | INTRAMUSCULAR | Status: DC | PRN

## 2023-02-25 MED ORDER — ONDANSETRON HCL 4 MG PO TABS: 4.0000 mg | ORAL_TABLET | Freq: Three times a day (TID) | ORAL | 0 refills | Status: AC | PRN

## 2023-02-25 MED ORDER — LACTATED RINGERS IV SOLN: INTRAVENOUS | Status: DC

## 2023-02-25 MED ORDER — LACTATED RINGERS IV BOLUS
500.0000 mL | Freq: Once | INTRAVENOUS | Status: AC
  Administered 2023-02-25: 500 mL via INTRAVENOUS

## 2023-02-25 MED ORDER — SUGAMMADEX SODIUM 200 MG/2ML IV SOLN
INTRAVENOUS | Status: DC | PRN
  Administered 2023-02-25: 200 mg via INTRAVENOUS

## 2023-02-25 MED ORDER — DEXAMETHASONE SODIUM PHOSPHATE 10 MG/ML IJ SOLN
INTRAMUSCULAR | Status: AC
  Filled 2023-02-25: qty 1

## 2023-02-25 MED ORDER — METHOCARBAMOL 500 MG PO TABS: 500.0000 mg | ORAL_TABLET | Freq: Four times a day (QID) | ORAL | Status: DC | PRN

## 2023-02-25 MED ORDER — CEFAZOLIN SODIUM-DEXTROSE 2-4 GM/100ML-% IV SOLN: 2.0000 g | Freq: Four times a day (QID) | INTRAVENOUS | Status: DC

## 2023-02-25 MED ORDER — ROCURONIUM BROMIDE 100 MG/10ML IV SOLN
INTRAVENOUS | Status: DC | PRN
  Administered 2023-02-25: 30 mg via INTRAVENOUS

## 2023-02-25 MED ORDER — ONDANSETRON HCL 4 MG/2ML IJ SOLN
INTRAMUSCULAR | Status: DC | PRN
  Administered 2023-02-25: 4 mg via INTRAVENOUS

## 2023-02-25 MED ORDER — ONDANSETRON HCL 4 MG/2ML IJ SOLN
INTRAMUSCULAR | Status: AC
  Filled 2023-02-25: qty 2

## 2023-02-25 MED ORDER — CHLORHEXIDINE GLUCONATE 0.12 % MT SOLN
15.0000 mL | Freq: Once | OROMUCOSAL | Status: AC
  Administered 2023-02-25: 15 mL via OROMUCOSAL

## 2023-02-25 MED ORDER — ACETAMINOPHEN 500 MG PO TABS: 1000.0000 mg | ORAL_TABLET | Freq: Three times a day (TID) | ORAL | 0 refills | Status: AC | PRN

## 2023-02-25 MED ORDER — MIDAZOLAM HCL 2 MG/2ML IJ SOLN
1.0000 mg | Freq: Once | INTRAMUSCULAR | Status: AC
  Administered 2023-02-25: 2 mg via INTRAVENOUS
  Filled 2023-02-25: qty 2

## 2023-02-25 MED ORDER — SODIUM CHLORIDE (PF) 0.9 % IJ SOLN
INTRAMUSCULAR | Status: DC | PRN
  Administered 2023-02-25: 60 mL

## 2023-02-25 MED ORDER — LACTATED RINGERS IV SOLN: INTRAVENOUS | Status: DC | PRN

## 2023-02-25 MED ORDER — ROPIVACAINE HCL 5 MG/ML IJ SOLN
INTRAMUSCULAR | Status: DC | PRN
  Administered 2023-02-25: 20 mL via PERINEURAL

## 2023-02-25 MED ORDER — PHENYLEPHRINE 80 MCG/ML (10ML) SYRINGE FOR IV PUSH (FOR BLOOD PRESSURE SUPPORT)
PREFILLED_SYRINGE | INTRAVENOUS | Status: AC
  Filled 2023-02-25: qty 10

## 2023-02-25 MED ORDER — PROPOFOL 1000 MG/100ML IV EMUL
INTRAVENOUS | Status: AC
  Filled 2023-02-25: qty 100

## 2023-02-25 MED ORDER — TRANEXAMIC ACID-NACL 1000-0.7 MG/100ML-% IV SOLN
1000.0000 mg | INTRAVENOUS | Status: AC
  Administered 2023-02-25: 1000 mg via INTRAVENOUS
  Filled 2023-02-25: qty 100

## 2023-02-25 MED ORDER — ORAL CARE MOUTH RINSE: 15.0000 mL | Freq: Once | OROMUCOSAL | Status: AC

## 2023-02-25 MED ORDER — ISOPROPYL ALCOHOL 70 % SOLN
Status: DC | PRN
  Administered 2023-02-25: 1 via TOPICAL

## 2023-02-25 MED ORDER — APIXABAN 2.5 MG PO TABS: 2.5000 mg | ORAL_TABLET | Freq: Two times a day (BID) | ORAL | 0 refills | Status: DC

## 2023-02-25 MED ORDER — WATER FOR IRRIGATION, STERILE IR SOLN
Status: DC | PRN
  Administered 2023-02-25: 2000 mL

## 2023-02-25 MED ORDER — OXYCODONE HCL 5 MG PO TABS
5.0000 mg | ORAL_TABLET | ORAL | Status: DC | PRN
  Administered 2023-02-25: 5 mg via ORAL

## 2023-02-25 SURGICAL SUPPLY — 67 items
ADH SKN CLS APL DERMABOND .7 (GAUZE/BANDAGES/DRESSINGS) ×1
APL PRP STRL LF DISP 70% ISPRP (MISCELLANEOUS) ×2
BAG COUNTER SPONGE SURGICOUNT (BAG) IMPLANT
BAG SPNG CNTER NS LX DISP (BAG) ×1
BLADE SAG 18X100X1.27 (BLADE) ×1 IMPLANT
BLADE SAW SAG 35X64 .89 (BLADE) ×1 IMPLANT
BNDG CMPR 5X3 CHSV STRCH STRL (GAUZE/BANDAGES/DRESSINGS) ×1
BNDG CMPR MED 10X6 ELC LF (GAUZE/BANDAGES/DRESSINGS) ×1
BNDG COHESIVE 3X5 TAN ST LF (GAUZE/BANDAGES/DRESSINGS) ×1 IMPLANT
BNDG ELASTIC 6X10 VLCR STRL LF (GAUZE/BANDAGES/DRESSINGS) ×1 IMPLANT
BOWL SMART MIX CTS (DISPOSABLE) ×1 IMPLANT
CEMENT BONE R 1X40 (Cement) IMPLANT
CEMENT BONE REFOBACIN R1X40 US (Cement) IMPLANT
CHLORAPREP W/TINT 26 (MISCELLANEOUS) ×2 IMPLANT
CLSR STERI-STRIP ANTIMIC 1/2X4 (GAUZE/BANDAGES/DRESSINGS) IMPLANT
COMP FEM PS STD 4 RT (Joint) ×1 IMPLANT
COMP PATELLAR 10X35 METAL (Joint) ×1 IMPLANT
COMPONENT FEM PS STD 4 RT (Joint) IMPLANT
COMPONENT PATELLAR 10X35 METAL (Joint) IMPLANT
COVER SURGICAL LIGHT HANDLE (MISCELLANEOUS) ×1 IMPLANT
CUFF TOURN SGL QUICK 34 (TOURNIQUET CUFF) ×1
CUFF TRNQT CYL 34X4.125X (TOURNIQUET CUFF) ×1 IMPLANT
DERMABOND ADVANCED .7 DNX12 (GAUZE/BANDAGES/DRESSINGS) ×1 IMPLANT
DRAPE INCISE IOBAN 85X60 (DRAPES) ×1 IMPLANT
DRAPE SHEET LG 3/4 BI-LAMINATE (DRAPES) ×1 IMPLANT
DRAPE U-SHAPE 47X51 STRL (DRAPES) ×1 IMPLANT
DRESSING AQUACEL AG SP 3.5X10 (GAUZE/BANDAGES/DRESSINGS) ×1 IMPLANT
DRSG AQUACEL AG ADV 3.5X10 (GAUZE/BANDAGES/DRESSINGS) IMPLANT
DRSG AQUACEL AG SP 3.5X10 (GAUZE/BANDAGES/DRESSINGS) ×1
ELECT REM PT RETURN 15FT ADLT (MISCELLANEOUS) ×1 IMPLANT
GAUZE SPONGE 4X4 12PLY STRL (GAUZE/BANDAGES/DRESSINGS) ×1 IMPLANT
GLOVE BIO SURGEON STRL SZ 6.5 (GLOVE) ×2 IMPLANT
GLOVE BIOGEL PI IND STRL 6.5 (GLOVE) ×1 IMPLANT
GLOVE BIOGEL PI IND STRL 8 (GLOVE) ×1 IMPLANT
GLOVE SURG ORTHO 8.0 STRL STRW (GLOVE) ×2 IMPLANT
GOWN STRL REUS W/ TWL XL LVL3 (GOWN DISPOSABLE) ×2 IMPLANT
GOWN STRL REUS W/TWL XL LVL3 (GOWN DISPOSABLE) ×2
HANDPIECE INTERPULSE COAX TIP (DISPOSABLE) ×1
HDLS TROCR DRIL PIN KNEE 75 (PIN) ×1
HOLDER FOLEY CATH W/STRAP (MISCELLANEOUS) ×1 IMPLANT
HOOD PEEL AWAY T7 (MISCELLANEOUS) ×3 IMPLANT
IMPL ASF RT PSN 4-5/CD 10 (Joint) IMPLANT
IMPLANT ASF RT PSN 4-5/CD 10 (Joint) ×1 IMPLANT
KIT TURNOVER KIT A (KITS) IMPLANT
MANIFOLD NEPTUNE II (INSTRUMENTS) ×1 IMPLANT
MARKER SKIN DUAL TIP RULER LAB (MISCELLANEOUS) ×1 IMPLANT
NS IRRIG 1000ML POUR BTL (IV SOLUTION) ×1 IMPLANT
PACK TOTAL KNEE CUSTOM (KITS) ×1 IMPLANT
PIN DRILL HDLS TROCAR 75 4PK (PIN) IMPLANT
SCREW HEADED 33MM KNEE (MISCELLANEOUS) IMPLANT
SET HNDPC FAN SPRY TIP SCT (DISPOSABLE) ×1 IMPLANT
SOLUTION IRRIG SURGIPHOR (IV SOLUTION) IMPLANT
SPIKE FLUID TRANSFER (MISCELLANEOUS) ×1 IMPLANT
STEM TIB PS KNEE D 0D RT (Stem) IMPLANT
STRIP CLOSURE SKIN 1/2X4 (GAUZE/BANDAGES/DRESSINGS) ×1 IMPLANT
SUT MNCRL AB 3-0 PS2 18 (SUTURE) ×1 IMPLANT
SUT STRATAFIX 0 PDS 27 VIOLET (SUTURE) ×1
SUT STRATAFIX PDO 1 14 VIOLET (SUTURE) ×1
SUT STRATFX PDO 1 14 VIOLET (SUTURE) ×1
SUT VIC AB 2-0 CT2 27 (SUTURE) ×2 IMPLANT
SUTURE STRATFX 0 PDS 27 VIOLET (SUTURE) ×1 IMPLANT
SUTURE STRATFX PDO 1 14 VIOLET (SUTURE) ×1 IMPLANT
SYR 50ML LL SCALE MARK (SYRINGE) ×1 IMPLANT
TRAY FOLEY MTR SLVR 14FR STAT (SET/KITS/TRAYS/PACK) IMPLANT
TUBE SUCTION HIGH CAP CLEAR NV (SUCTIONS) ×1 IMPLANT
UNDERPAD 30X36 HEAVY ABSORB (UNDERPADS AND DIAPERS) ×1 IMPLANT
WRAP KNEE MAXI GEL POST OP (GAUZE/BANDAGES/DRESSINGS) IMPLANT

## 2023-02-25 NOTE — Anesthesia Preprocedure Evaluation (Addendum)
Anesthesia Evaluation  Patient identified by MRN, date of birth, ID band Patient awake    Reviewed: Allergy & Precautions, NPO status , Patient's Chart, lab work & pertinent test results  Airway Mallampati: III  TM Distance: >3 FB Neck ROM: Full    Dental  (+) Teeth Intact, Dental Advisory Given Upper and lower braces :   Pulmonary asthma    Pulmonary exam normal breath sounds clear to auscultation       Cardiovascular hypertension, Pt. on medications  Rhythm:Regular Rate:Normal + Systolic murmurs    Neuro/Psych  PSYCHIATRIC DISORDERS Anxiety     Cervical myelopathy s/p ACDF TIA   GI/Hepatic Neg liver ROS, hiatal hernia,GERD  Medicated and Poorly Controlled,,  Endo/Other  Hypothyroidism  Obesity   Renal/GU negative Renal ROS     Musculoskeletal  (+) Arthritis , Osteoarthritis,    Abdominal   Peds  Hematology negative hematology ROS (+) Plt 248k   Anesthesia Other Findings Day of surgery medications reviewed with the patient.  Reproductive/Obstetrics                             Anesthesia Physical Anesthesia Plan  ASA: 3  Anesthesia Plan: General   Post-op Pain Management: Regional block* and Tylenol PO (pre-op)*   Induction: Intravenous  PONV Risk Score and Plan: 3 and Midazolam, Dexamethasone, Ondansetron and TIVA  Airway Management Planned: Oral ETT  Additional Equipment:   Intra-op Plan:   Post-operative Plan: Extubation in OR  Informed Consent: I have reviewed the patients History and Physical, chart, labs and discussed the procedure including the risks, benefits and alternatives for the proposed anesthesia with the patient or authorized representative who has indicated his/her understanding and acceptance.     Dental advisory given  Plan Discussed with: CRNA  Anesthesia Plan Comments:         Anesthesia Quick Evaluation

## 2023-02-25 NOTE — Transfer of Care (Signed)
Immediate Anesthesia Transfer of Care Note  Patient: GRACELYNNE KUCHAR  Procedure(s) Performed: TOTAL KNEE ARTHROPLASTY (Right: Knee)  Patient Location: PACU  Anesthesia Type:General  Level of Consciousness: awake and alert   Airway & Oxygen Therapy: Patient Spontanous Breathing and Patient connected to nasal cannula oxygen  Post-op Assessment: Report given to RN and Post -op Vital signs reviewed and stable  Post vital signs: Reviewed and stable  Last Vitals:  Vitals Value Taken Time  BP 143/95 02/25/23 1223  Temp    Pulse 138 02/25/23 1228  Resp 17 02/25/23 1228  SpO2 100 % 02/25/23 1228  Vitals shown include unvalidated device data.  Last Pain:  Vitals:   02/25/23 0901  TempSrc:   PainSc: 0-No pain         Complications: No notable events documented.

## 2023-02-25 NOTE — Anesthesia Procedure Notes (Signed)
Anesthesia Regional Block: Adductor canal block   Pre-Anesthetic Checklist: , timeout performed,  Correct Patient, Correct Site, Correct Laterality,  Correct Procedure, Correct Position, site marked,  Risks and benefits discussed,  Surgical consent,  Pre-op evaluation,  At surgeon's request and post-op pain management  Laterality: Right  Prep: chloraprep       Needles:  Injection technique: Single-shot  Needle Type: Echogenic Needle     Needle Length: 9cm  Needle Gauge: 21     Additional Needles:   Procedures:,,,, ultrasound used (permanent image in chart),,    Narrative:  Start time: 02/25/2023 8:55 AM End time: 02/25/2023 9:05 AM Injection made incrementally with aspirations every 5 mL.  Performed by: Personally  Anesthesiologist: [name], MD  Additional Notes: No pain on injection. No increased resistance to injection. Injection made in 5cc increments.  Good needle visualization.  Patient tolerated procedure well.

## 2023-02-25 NOTE — Discharge Instructions (Signed)
INSTRUCTIONS AFTER JOINT REPLACEMENT   Remove items at home which could result in a fall. This includes throw rugs or furniture in walking pathways ICE to the affected joint every three hours while awake for 30 minutes at a time, for at least the first 3-5 days, and then as needed for pain and swelling.  Continue to use ice for pain and swelling. You may notice swelling that will progress down to the foot and ankle.  This is normal after surgery.  Elevate your leg when you are not up walking on it.   Continue to use the breathing machine you got in the hospital (incentive spirometer) which will help keep your temperature down.  It is common for your temperature to cycle up and down following surgery, especially at night when you are not up moving around and exerting yourself.  The breathing machine keeps your lungs expanded and your temperature down.   DIET:  As you were doing prior to hospitalization, we recommend a well-balanced diet.  DRESSING / WOUND CARE / SHOWERING  Keep the surgical dressing until follow up.  The dressing is water proof, so you can shower without any extra covering.  IF THE DRESSING FALLS OFF or the wound gets wet inside, change the dressing with sterile gauze.  Please use good hand washing techniques before changing the dressing.  Do not use any lotions or creams on the incision until instructed by your surgeon.    ACTIVITY  Increase activity slowly as tolerated, but follow the weight bearing instructions below.   No driving for 6 weeks or until further direction given by your physician.  You cannot drive while taking narcotics.  No lifting or carrying greater than 10 lbs. until further directed by your surgeon. Avoid periods of inactivity such as sitting longer than an hour when not asleep. This helps prevent blood clots.  You may return to work once you are authorized by your doctor.     WEIGHT BEARING   Weight bearing as tolerated with assist device (walker, cane,  etc) as directed, use it as long as suggested by your surgeon or therapist, typically at least 4-6 weeks.   EXERCISES  Results after joint replacement surgery are often greatly improved when you follow the exercise, range of motion and muscle strengthening exercises prescribed by your doctor. Safety measures are also important to protect the joint from further injury. Any time any of these exercises cause you to have increased pain or swelling, decrease what you are doing until you are comfortable again and then slowly increase them. If you have problems or questions, call your caregiver or physical therapist for advice.   Rehabilitation is important following a joint replacement. After just a few days of immobilization, the muscles of the leg can become weakened and shrink (atrophy).  These exercises are designed to build up the tone and strength of the thigh and leg muscles and to improve motion. Often times heat used for twenty to thirty minutes before working out will loosen up your tissues and help with improving the range of motion but do not use heat for the first two weeks following surgery (sometimes heat can increase post-operative swelling).   These exercises can be done on a training (exercise) mat, on the floor, on a table or on a bed. Use whatever works the best and is most comfortable for you.    Use music or television while you are exercising so that the exercises are a pleasant break in your   day. This will make your life better with the exercises acting as a break in your routine that you can look forward to.   Perform all exercises about fifteen times, three times per day or as directed.  You should exercise both the operative leg and the other leg as well.  Exercises include:   Quad Sets - Tighten up the muscle on the front of the thigh (Quad) and hold for 5-10 seconds.   Straight Leg Raises - With your knee straight (if you were given a brace, keep it on), lift the leg to 60  degrees, hold for 3 seconds, and slowly lower the leg.  Perform this exercise against resistance later as your leg gets stronger.  Leg Slides: Lying on your back, slowly slide your foot toward your buttocks, bending your knee up off the floor (only go as far as is comfortable). Then slowly slide your foot back down until your leg is flat on the floor again.  Angel Wings: Lying on your back spread your legs to the side as far apart as you can without causing discomfort.  Hamstring Strength:  Lying on your back, push your heel against the floor with your leg straight by tightening up the muscles of your buttocks.  Repeat, but this time bend your knee to a comfortable angle, and push your heel against the floor.  You may put a pillow under the heel to make it more comfortable if necessary.   A rehabilitation program following joint replacement surgery can speed recovery and prevent re-injury in the future due to weakened muscles. Contact your doctor or a physical therapist for more information on knee rehabilitation.    CONSTIPATION  Constipation is defined medically as fewer than three stools per week and severe constipation as less than one stool per week.  Even if you have a regular bowel pattern at home, your normal regimen is likely to be disrupted due to multiple reasons following surgery.  Combination of anesthesia, postoperative narcotics, change in appetite and fluid intake all can affect your bowels.   YOU MUST use at least one of the following options; they are listed in order of increasing strength to get the job done.  They are all available over the counter, and you may need to use some, POSSIBLY even all of these options:    Drink plenty of fluids (prune juice may be helpful) and high fiber foods Colace 100 mg by mouth twice a day  Senokot for constipation as directed and as needed Dulcolax (bisacodyl), take with full glass of water  Miralax (polyethylene glycol) once or twice a day as  needed.  If you have tried all these things and are unable to have a bowel movement in the first 3-4 days after surgery call either your surgeon or your primary doctor.    If you experience loose stools or diarrhea, hold the medications until you stool forms back up.  If your symptoms do not get better within 1 week or if they get worse, check with your doctor.  If you experience "the worst abdominal pain ever" or develop nausea or vomiting, please contact the office immediately for further recommendations for treatment.   ITCHING:  If you experience itching with your medications, try taking only a single pain pill, or even half a pain pill at a time.  You can also use Benadryl over the counter for itching or also to help with sleep.   TED HOSE STOCKINGS:  Use stockings on both   legs until for at least 2 weeks or as directed by physician office. They may be removed at night for sleeping.  MEDICATIONS:  See your medication summary on the "After Visit Summary" that nursing will review with you.  You may have some home medications which will be placed on hold until you complete the course of blood thinner medication.  It is important for you to complete the blood thinner medication as prescribed.   Blood clot prevention (DVT Prophylaxis): After surgery you are at an increased risk for a blood clot. you were prescribed a blood thinner, Eliquis 2.5mg, to be taken twice daily for a total of 4 weeks from surgery to help reduce your risk of getting a blood clot. This will help prevent a blood clot. Signs of a pulmonary embolus (blood clot in the lungs) include sudden short of breath, feeling lightheaded or dizzy, chest pain with a deep breath, rapid pulse rapid breathing. Signs of a blood clot in your arms or legs include new unexplained swelling and cramping, warm, red or darkened skin around the painful area. Please call the office or 911 right away if these signs or symptoms develop.  PRECAUTIONS:  If you  experience chest pain or shortness of breath - call 911 immediately for transfer to the hospital emergency department.   If you develop a fever greater that 101 F, purulent drainage from wound, increased redness or drainage from wound, foul odor from the wound/dressing, or calf pain - CONTACT YOUR SURGEON.                                                   FOLLOW-UP APPOINTMENTS:  If you do not already have a post-op appointment, please call the office for an appointment to be seen by your surgeon.  Guidelines for how soon to be seen are listed in your "After Visit Summary", but are typically between 2-3 weeks after surgery.  OTHER INSTRUCTIONS:   Knee Replacement:  Do not place pillow under knee, focus on keeping the knee straight while resting. DO NOT modify, tear, cut, or change the foam block in any way.  POST-OPERATIVE OPIOID TAPER INSTRUCTIONS: It is important to wean off of your opioid medication as soon as possible. If you do not need pain medication after your surgery it is ok to stop day one. Opioids include: Codeine, Hydrocodone(Norco, Vicodin), Oxycodone(Percocet, oxycontin) and hydromorphone amongst others.  Long term and even short term use of opiods can cause: Increased pain response Dependence Constipation Depression Respiratory depression And more.  Withdrawal symptoms can include Flu like symptoms Nausea, vomiting And more Techniques to manage these symptoms Hydrate well Eat regular healthy meals Stay active Use relaxation techniques(deep breathing, meditating, yoga) Do Not substitute Alcohol to help with tapering If you have been on opioids for less than two weeks and do not have pain than it is ok to stop all together.  Plan to wean off of opioids This plan should start within one week post op of your joint replacement. Maintain the same interval or time between taking each dose and first decrease the dose.  Cut the total daily intake of opioids by one tablet  each day Next start to increase the time between doses. The last dose that should be eliminated is the evening dose.   MAKE SURE YOU:  Understand these instructions.    Get help right away if you are not doing well or get worse.    Thank you for letting us be a part of your medical care team.  It is a privilege we respect greatly.  We hope these instructions will help you stay on track for a fast and full recovery!         

## 2023-02-25 NOTE — Interval H&P Note (Signed)
The patient has been re-examined, and the chart reviewed, and there have been no interval changes to the documented history and physical.    Plan for R TKA  The operative side was examined and the patient was confirmed to have sensation to DPN, SPN, TN intact, Motor EHL, ext, flex 5/5, and DP 2+, PT 2+, No significant edema.   The risks, benefits, and alternatives have been discussed at length with patient, and the patient is willing to proceed.  Right knee marked. Consent has been signed.

## 2023-02-25 NOTE — Anesthesia Procedure Notes (Signed)
Procedure Name: Intubation Date/Time: 02/25/2023 10:46 AM  Performed by: Timoteo Expose, CRNAPre-anesthesia Checklist: Patient identified, Emergency Drugs available, Suction available and Patient being monitored Patient Re-evaluated:Patient Re-evaluated prior to induction Oxygen Delivery Method: Circle system utilized Preoxygenation: Pre-oxygenation with 100% oxygen Induction Type: IV induction Ventilation: Mask ventilation without difficulty Laryngoscope Size: Mac and 3 Grade View: Grade I Tube type: Oral Tube size: 7.0 mm Number of attempts: 1 Airway Equipment and Method: Stylet and Oral airway Placement Confirmation: ETT inserted through vocal cords under direct vision, positive ETCO2 and breath sounds checked- equal and bilateral Secured at: 21 cm Tube secured with: Tape Dental Injury: Teeth and Oropharynx as per pre-operative assessment

## 2023-02-25 NOTE — Progress Notes (Signed)
Orthopedic Tech Progress Note Patient Details:  Alison [name] 19-Apr-1961 [id number]  Ortho Devices Type of Ortho Device: Bone foam zero knee Ortho Device/Splint Interventions: Ordered      Alison [name] 02/25/2023, 1:12 PM

## 2023-02-25 NOTE — Evaluation (Signed)
Physical Therapy Evaluation Patient Details Name: Alison [name] MRN: [id number] DOB: [birth year] Today's Date: 02/25/2023  History of Present Illness  62 yo female presents to therapy s/p R TKA on 02/25/2023 due to failure of conservative measures. Pt PMH includes but is not limited to: L TKA (10/31/2022), gastric sleeve, RLS, TIA, HDL, HTN, ACDF C4-5, 5-6 and 6-7.  Clinical Impression    Alison [name] is a 62 y.o. female POD 0 s/p R TKA. Patient reports IND with mobility at baseline. Patient is now limited by functional impairments (see PT problem list below) and requires min guard and cues for transfers and gait with RW. Patient was able to ambulate 45 and 40 feet with RW and min guard and cues for safe walker management. Patient educated on safe sequencing for stair mobility, car transfers, pain management, use of ice man and fall risk prevention and verbalized safe guarding position for people assisting with mobility. Patient instructed in exercises to facilitate ROM and circulation reviewed and HO provided. Patient will benefit from continued skilled PT interventions to address impairments and progress towards PLOF. Patient has met mobility goals at adequate level for discharge home with family support and OPPT; will continue to follow if pt continues acute stay to progress towards Mod I goals.      Recommendations for follow up therapy are one component of a multi-disciplinary discharge planning process, led by the attending physician.  Recommendations may be updated based on patient status, additional functional criteria and insurance authorization.  Follow Up Recommendations Outpatient PT      Assistance Recommended at Discharge Intermittent Supervision/Assistance  Patient can return home with the following  A little help with walking and/or transfers;A little help with bathing/dressing/bathroom;Assistance with cooking/housework;Assist for transportation;Help with stairs or ramp for  entrance    Equipment Recommendations None recommended by PT (DME in home setting)  Recommendations for Other Services       Functional Status Assessment Patient has had a recent decline in their functional status and demonstrates the ability to make significant improvements in function in a reasonable and predictable amount of time.     Precautions / Restrictions Precautions Precautions: Knee;Fall Restrictions Weight Bearing Restrictions: No      Mobility  Bed Mobility Overal bed mobility: Needs Assistance Bed Mobility: Supine to Sit     Supine to sit: Min guard          Transfers Overall transfer level: Needs assistance Equipment used: Rolling walker (2 wheels) Transfers: Sit to/from Stand Sit to Stand: Min guard           General transfer comment: cues for proper UE and R LE placement    Ambulation/Gait Ambulation/Gait assistance: Min guard Gait Distance (Feet): 45 Feet Assistive device: Rolling walker (2 wheels) Gait Pattern/deviations: Step-to pattern, Trunk flexed Gait velocity: decreased     General Gait Details: B UE support at [employer] Stairs: Yes Stairs assistance: Min guard Stair Management: Two rails Number of Stairs: 3 General stair comments: cues for sequencing and technique as well as walker placement to navigate one step to enter home  Wheelchair Mobility    Modified Rankin (Stroke Patients Only)       Balance Overall balance assessment: Needs assistance Sitting-balance support: Feet unsupported, No upper extremity supported Sitting balance-Leahy Scale: Fair     Standing balance support: Reliant on assistive device for balance, During functional activity (static stand no UE support) Standing balance-Leahy Scale: Fair  Pertinent Vitals/Pain Pain Assessment Pain Assessment: 0-10 Pain Score: 3  Pain Location: R knee Pain Descriptors / Indicators: Aching, Constant, Operative site  guarding Pain Intervention(s): Limited activity within patient's tolerance, Monitored during session, Premedicated before session, Repositioned, Ice applied    Home Living Family/patient expects to be discharged to:: Private residence Living Arrangements: Children Available Help at Discharge: Family;Available 24 hours/day Type of Home: House Home Access: Stairs to enter Entrance Stairs-Rails: None Entrance Stairs-Number of Steps: 1 Alternate Level Stairs-Number of Steps: flight, has stair glide up, not on last 4 steps at top Home Layout: Two level;Able to live on main level with bedroom/bathroom Home Equipment: Conservation officer, nature (2 wheels);BSC/3in1      Prior Function Prior Level of Function : Independent/Modified Independent             Mobility Comments: IND wtih all ADLs, self care tasks, IADLs, driving and working from home       Hand Dominance        Extremity/Trunk Assessment        Lower Extremity Assessment Lower Extremity Assessment: RLE deficits/detail RLE Deficits / Details: ankle DF/PF 5/5 SLR , 10 degree lag, residual quad weakness unable to perform LAQ RLE Sensation: WNL       Communication   Communication: No difficulties  Cognition Arousal/Alertness: Awake/alert Behavior During Therapy: WFL for tasks assessed/performed Overall Cognitive Status: Within Functional Limits for tasks assessed                                          General Comments      Exercises Total Joint Exercises Ankle Circles/Pumps: AROM, Both, 20 reps Quad Sets: AROM, Right, 5 reps Short Arc Quad: AROM, Right, 5 reps Heel Slides: AROM, Right, 5 reps Hip ABduction/ADduction: AROM, Right, 5 reps Straight Leg Raises: AROM, Right, 5 reps Long Arc Quad: AAROM, Right, 5 reps   Assessment/Plan    PT Assessment Patient needs continued PT services  PT Problem List Decreased strength;Decreased range of motion;Decreased activity tolerance;Decreased  balance;Decreased mobility;Decreased coordination;Other (comment)       PT Treatment Interventions DME instruction;Gait training;Stair training;Functional mobility training;Therapeutic activities;Therapeutic exercise;Balance training;Neuromuscular re-education;Patient/family education;Modalities    PT Goals (Current goals can be found in the Care Plan section)  Acute Rehab PT Goals Patient Stated Goal: to be able to walk my dog 3 mils per day PT Goal Formulation: With patient Time For Goal Achievement: 03/11/23 Potential to Achieve Goals: Good    Frequency       Co-evaluation               AM-PAC PT "6 Clicks" Mobility  Outcome Measure Help needed turning from your back to your side while in a flat bed without using bedrails?: None Help needed moving from lying on your back to sitting on the side of a flat bed without using bedrails?: A Little Help needed moving to and from a bed to a chair (including a wheelchair)?: A Little Help needed standing up from a chair using your arms (e.g., wheelchair or bedside chair)?: A Little Help needed to walk in hospital room?: A Little Help needed climbing 3-5 steps with a railing? : A Little 6 Click Score: 19    End of Session Equipment Utilized During Treatment: Gait belt Activity Tolerance: Patient tolerated treatment well;No increased pain Patient left: in chair;with call bell/phone within reach;with family/visitor present Nurse Communication: Mobility  status;Other (comment) (progression toward d/c) PT Visit Diagnosis: Unsteadiness on feet (R26.81);Other abnormalities of gait and mobility (R26.89);Muscle weakness (generalized) (M62.81);Difficulty in walking, not elsewhere classified (R26.2);Pain Pain - Right/Left: Right Pain - part of body: Knee    Time: 1440-1518 PT Time Calculation (min) (ACUTE ONLY): 38 min   Charges:   PT Evaluation $PT Eval Low Complexity: 1 Low PT Treatments $Gait Training: 8-22 mins        Baird Lyons, PT   Adair Patter 02/25/2023, 3:33 PM

## 2023-02-25 NOTE — Op Note (Signed)
DATE OF SURGERY:  02/25/2023 TIME: 11:36 AM  PATIENT NAME:  Alison [name]   AGE: 62 y.o.    PRE-OPERATIVE DIAGNOSIS: End-stage right knee osteoarthritis  POST-OPERATIVE DIAGNOSIS:  Same  PROCEDURE: Right total Knee Arthroplasty  SURGEON:  Charmeka Freeburg A Aislyn Hayse, MD   ASSISTANT: Dorise Bullion, PA-C, present and scrubbed throughout the case, critical for assistance with exposure, retraction, instrumentation, and closure.   OPERATIVE IMPLANTS:  Implant Name Type Inv. Item Serial No. Manufacturer Lot No. LRB No. Used Action  STEM TIB PS KNEE D 0D RT - [id number] Stem STEM TIB PS KNEE D 0D RT  [employer](ORTH,TRAU,BIO,SG) [id number] Right 1 Implanted  COMP FEM PS STD 4 RT - [id number] Joint COMP FEM PS STD 4 RT  [employer](ORTH,TRAU,BIO,SG) [id number] Right 1 Implanted  COMP PATELLAR 10X35 METAL - [id number] Joint COMP PATELLAR 10X35 METAL  [employer](ORTH,TRAU,BIO,SG) [id number] Right 1 Implanted  IMPLANT ASF RT PSN 4-5/CD 10 [id number] Joint IMPLANT ASF RT PSN 4-5/CD 10  [employer]ON(ORTH,TRAU,BIO,SG) [id number] Right 1 Implanted      PREOPERATIVE INDICATIONS:  Alison [name] is a 62 y.o. year old female with end stage bone on bone degenerative arthritis of the knee who failed conservative treatment, including injections, antiinflammatories, activity modification, and assistive devices, and had significant impairment of their activities of daily living, and elected for Total Knee Arthroplasty.   The risks, benefits, and alternatives were discussed at length including but not limited to the risks of infection, bleeding, nerve injury, stiffness, blood clots, the need for revision surgery, cardiopulmonary complications, among others, and they were willing to proceed.   ESTIMATED BLOOD LOSS: 250cc (patient's blood pressure went up during the surgery creating a venous tourniquet resulting in moderate oozing from the bone surfaces during the case.)  OPERATIVE DESCRIPTION:   Once adequate  anesthesia was induced, preoperative antibiotics, 2 gm of ancef,1 gm of Tranexamic Acid, and 8 mg of Decadron administered, the patient was positioned supine with a right thigh tourniquet placed.  The right lower extremity was prepped and draped in sterile fashion.  A time-  out was performed identifying the patient, planned procedure, and the appropriate extremity.     The leg was  exsanguinated, tourniquet elevated to 250 mmHg.  A midline incision was  made followed by median parapatellar arthrotomy. Anterior horn of the medial meniscus was released and resected. A medial release was performed, the infrapatellar fat pad was resected with care taken to protect the patellar tendon. The suprapatellar fat was removed to exposed the distal anterior femur. The anterior horn of the lateral meniscus and ACL were released.    Following initial  exposure, I first started with the femur  The femoral  canal was opened with a drill, canal was suctioned to try to prevent fat emboli.  An  intramedullary rod was passed set at 5 degrees valgus, 10 mm. The distal femur was resected.  Following this resection, the tibia was  subluxated anteriorly.  Using the extramedullary guide, 10 mm of bone was resected off   the proximal lateral tibia.  We confirmed the gap would be  stable medially and laterally with a size 17mm spacer block as well as confirmed that the tibial cut was perpendicular in the coronal plane, checking with an alignment rod.    Once this was done, the posterior femoral referencing femoral sizer was placed under to the posterior condyles with 5 degrees of external rotational which was parallel to the transepicondylar axis and perpendicular to Alison [name]  line. The femur was sized to be a size 4 in the anterior-  posterior dimension. The  anterior, posterior, and  chamfer cuts were made without difficulty nor   notching making certain that I was along the anterior cortex to help  with flexion gap stability. Next  a laminar spreader was placed with the knee in flexion and the medial lateral menisci were resected.  5 cc of the Exparel mixture was injected in the medial side of the back of the knee and 3 cc in the lateral side.  1/2 inch curved osteotome was used to resect posterior osteophyte that was then removed with a pituitary rongeur.       At this point, the tibia was sized to be a size D.  The size D tray was  then pinned in position. Trial reduction was now carried with a 4 femur, D tibia, a 10 mm MC insert.  The knee had full extension and was stable to varus valgus stress in extension.  The knee was slightly tight in flexion and the PCL was partially released.   Attention was next directed to the patella.  Precut  measurement was noted to be 22 mm.  I resected down to 14 mm and used a  38mm patellar button to restore patellar height as well as cover the cut surface.     The patella lug holes were drilled and a 62mm patella poly trial was placed.    The knee was brought to full extension with good flexion stability with the patella tracking through the trochlea without application of pressure.    Next the femoral component was again assessed and determined to be seated and appropriately lateralized.  The femoral lug holes were drilled.  The femoral component was then removed. Tibial component was again assessed and felt to be seated and appropriately rotated with the medial third of the tubercle. The tibia was then drilled, and keel punched.     Final components were  opened and impacted into place.   The knee was irrigated with sterile Betadine diluted in saline as well as pulse lavage normal saline. The synovial lining was  then injected a dilute Exparel.     I confirmed that I was satisfied with the range of motion and stability, and the final 10 mm MC poly insert was chosen.  It was placed into the knee.         The tourniquet had been let down at 56 minutes.  No significant hemostasis was  required.  The medial parapatellar arthrotomy was then reapproximated using #1 Stratafix sutures with the knee  in flexion.  The remaining wound was closed with 0 stratafix, 2-0 Vicryl, and running 3-0 Monocryl. The knee was cleaned, dried, dressed sterilely using Dermabond and   Aquacel dressing.  The patient was then brought to recovery room in stable condition, tolerating the procedure  well. There were no complications.   Post op recs: WB: WBAT Abx: ancef Imaging: PACU xrays DVT prophylaxis: Eliquis 2.5mg  BID x4 weeks Follow up: 2 weeks after surgery for a wound check with Dr. Zachery Dakins at [facility].  Address: [address], [place], [place] [address]  Office Phone: [phone]  Charlies Constable, MD Orthopaedic Surgery

## 2023-02-26 ENCOUNTER — Encounter (HOSPITAL_COMMUNITY): Payer: Self-pay | Admitting: Orthopedic Surgery

## 2023-02-26 DIAGNOSIS — M1711 Unilateral primary osteoarthritis, right knee: Secondary | ICD-10-CM | POA: Diagnosis not present

## 2023-02-26 NOTE — Anesthesia Postprocedure Evaluation (Signed)
Anesthesia Post Note  Patient: Alison [name]  Procedure(s) Performed: TOTAL KNEE ARTHROPLASTY (Right: Knee)     Patient location during evaluation: PACU Anesthesia Type: General Level of consciousness: awake and alert Pain management: pain level controlled Vital Signs Assessment: post-procedure vital signs reviewed and stable Respiratory status: spontaneous breathing, nonlabored ventilation, respiratory function stable and patient connected to nasal cannula oxygen Cardiovascular status: blood pressure returned to baseline and stable Postop Assessment: no apparent nausea or vomiting Anesthetic complications: no   No notable events documented.  Last Vitals:  Vitals:   02/25/23 1345 02/25/23 1430  BP: 127/73 129/76  Pulse: (!) 59 74  Resp: 16 16  Temp: (!) 36.4 C   SpO2: 96% 96%    Last Pain:  Vitals:   02/25/23 1430  TempSrc:   PainSc: 3    Pain Goal:                   [name]

## 2023-03-25 ENCOUNTER — Ambulatory Visit: Payer: 59 | Admitting: Internal Medicine

## 2023-03-27 ENCOUNTER — Other Ambulatory Visit: Payer: Self-pay | Admitting: Family Medicine

## 2023-03-27 DIAGNOSIS — F411 Generalized anxiety disorder: Secondary | ICD-10-CM

## 2023-05-14 ENCOUNTER — Encounter: Payer: Self-pay | Admitting: Gastroenterology

## 2023-05-14 ENCOUNTER — Ambulatory Visit (INDEPENDENT_AMBULATORY_CARE_PROVIDER_SITE_OTHER): Payer: No Typology Code available for payment source | Admitting: Gastroenterology

## 2023-05-14 VITALS — BP 122/74 | HR 88 | Ht 62.0 in | Wt 197.0 lb

## 2023-05-14 DIAGNOSIS — K219 Gastro-esophageal reflux disease without esophagitis: Secondary | ICD-10-CM | POA: Diagnosis not present

## 2023-05-14 DIAGNOSIS — R112 Nausea with vomiting, unspecified: Secondary | ICD-10-CM | POA: Insufficient documentation

## 2023-05-14 MED ORDER — FAMOTIDINE 20 MG PO TABS: 20.0000 mg | ORAL_TABLET | Freq: Every day | ORAL | 3 refills | Status: DC

## 2023-05-14 NOTE — Progress Notes (Signed)
05/14/2023 Alison [name] [id number] 1961-08-25   HISTORY OF PRESENT ILLNESS:  this is a 62 year old female who is a patient of Dr. Derek Mound with a past medical history of hypertension, TIA in 1982, mild bilateral carotid stenosis, asthma, thyroid disease s/p ablation 2004, GERD and obesity s/p lap band surgery 2016 with subsequent lab band removal and gastric sleeve surgery 2021.   Recent EGD 01/2023: - Large hiatal/ hernia. - Five gastric polyps. Resected and retrieved. - Gastritis. Biopsied. - A sleeve gastrectomy was found, characterize: d by healthy appearing mucosa. - Duodenitis. Biopsied. - Biopsies were taken with a cold forceps for histology in the proximal esophagus and in the distal esophagus.  1. Surgical [P], duodenal biopsies - DUODENAL MUCOSA WITH NO SIGNIFICANT PATHOLOGY. 2. Surgical [P], gastric biopsies - ANTRAL MUCOSA WITH CHEMICAL/REACTIVE GASTROPATHY. - OXYNTIC MUCOSA WITH NO SIGNIFICANT PATHOLOGY. - NO HELICOBACTER PYLORI ORGANISMS IDENTIFIED ON H&E STAINED SLIDE. 3. Surgical [P], gastric polyps, polyp (5) - PREDOMINANTLY OXYNTIC TYPE MUCOSA WITH FOCAL FUNDIC GLAND DILATATION AS WELL AS SUPERFICIAL FOVEOLAR HYPERPLASIA, OVERLAPPING FEATURES OF FUNDIC GLAND POLYP AND HYPERPLASTIC POLYP. - NEGATIVE FOR DYSPLASIA. 4. Surgical [P], esophageal biopsies - SQUAMOUS MUCOSA WITH NO SIGNIFICANT PATHOLOGY.  She is here today for follow-up.  She has pantoprazole 40 mg twice daily.  She says that overall she is feeling better.  She knows that she needs to be consistent with taking that and needs to make some changes to her diet.  Says that she has bad eating habits.  She does notice more acid reflux issues when lying back.  Is not interested in surgery/hiatal hernia repair at this time.   Past Medical History:  Diagnosis Date   Anxiety    Arthritis    LEFT KNEE   Asthma    Cervical myelopathy (HCC) 08/28/2018   Family history of adverse reaction to anesthesia    Sister  PONV   GERD (gastroesophageal reflux disease)    Heart murmur    Hiatal hernia    Hx-TIA (transient ischemic attack) 1992   Hyperlipidemia    Hypertension    not on meds for 2 mos at preop on 02/13/23   Hypothyroidism    Obesity    Pneumonia    Polycystic ovarian syndrome    Stroke (HCC)    TIA - 1982   Past Surgical History:  Procedure Laterality Date   ABDOMINAL HYSTERECTOMY  2009   Partial   ANTERIOR CERVICAL DECOMPRESSION/DISCECTOMY FUSION 4 LEVELS N/A 08/28/2018   Procedure: ANTERIOR CERVICAL DECOMPRESSION FUSION, CERVICAL 4-5, CERVICAL 5-6, CERVICAL 6-7 WITH INSTRUMENTATION AND ALLOGRAFT;  Surgeon: Estill Bamberg, MD;  Location: [facility] OR;  Service: Orthopedics;  Laterality: N/A;   BREAST LUMPECTOMY Left 06/27/2020   Procedure: LEFT BREAST LUMPECTOMY;  Surgeon: Abigail Miyamoto, MD;  Location: [facility] OR;  Service: General;  Laterality: Left;  LMA   DIAGNOSTIC LAPAROSCOPY     FEMUR FRACTURE SURGERY  2008   Left   FRACTURE SURGERY     I & D EXTREMITY Left 06/01/2018   Procedure: IRRIGATION AND DEBRIDEMENT LEFT CALF;  Surgeon: Kathryne Hitch, MD;  Location: [facility] OR;  Service: Orthopedics;  Laterality: Left;   KNEE CARTILAGE SURGERY  2005   LAPAROSCOPIC GASTRIC BAND REMOVAL WITH LAPAROSCOPIC GASTRIC SLEEVE RESECTION N/A 05/03/2020   Procedure: LAPAROSCOPIC GASTRIC BAND REMOVAL WITH LAPAROSCOPIC GASTRIC SLEEVE RESECTION; UPPER ENDO AND ERAS PATHWAY;  Surgeon: Kinsinger, De Blanch, MD;  Location: [facility] ORS;  Service: General;  Laterality: N/A;  LAPAROSCOPIC GASTRIC BANDING  11/12/2011   Procedure: LAPAROSCOPIC GASTRIC BANDING;  Surgeon: Rulon Abide, DO;  Location: [facility] ORS;  Service: General;  Laterality: N/A;  nathanson liver retractor/ endostitch available 2-0 surgidac refills/ layton needle drivers available/ [employer] Doctor to proctor   TONSILLECTOMY  2007   TOTAL KNEE ARTHROPLASTY Left 10/31/2022   Procedure: TOTAL KNEE ARTHROPLASTY;  Surgeon: Joen Laura, MD;   Location: [facility] ORS;  Service: Orthopedics;  Laterality: Left;   TOTAL KNEE ARTHROPLASTY Right 02/25/2023   Procedure: TOTAL KNEE ARTHROPLASTY;  Surgeon: Joen Laura, MD;  Location: [facility] ORS;  Service: Orthopedics;  Laterality: Right;    reports that she has never smoked. She has never used smokeless tobacco. She reports that she does not currently use alcohol. She reports that she does not use drugs. family history includes Anesthesia problems in her sister; Breast cancer (age of onset: 84) in her maternal aunt; Cancer in her maternal aunt; Colon polyps in her mother; Glaucoma in her mother; Heart disease in her father; Hyperthyroidism in her mother; Migraines in her sister; Myasthenia gravis in her paternal grandfather; Ovarian cancer in her maternal grandmother; Stroke in her father; Uterine cancer in her maternal grandmother. Allergies  Allergen Reactions   Aspirin Anaphylaxis   Cherry Hives and Shortness Of Breath   Other Hives and Itching    Walnuts and pecans - cause itching and hives in mouth   Nsaids Other (See Comments)    Per patient, Drs have told her not to take nsaids because she was a bad reaction to aspirin   Soy Allergy Hives    " hives in mouth and wheezing"    Penicillins Itching    Has patient had a PCN reaction causing immediate rash, facial/tongue/throat swelling, SOB or lightheadedness with hypotension: No Has patient had a PCN reaction causing severe rash involving mucus membranes or skin necrosis: No Has patient had a PCN reaction that required hospitalization: No Has patient had a PCN reaction occurring within the last 10 years: No If all of the above answers are "NO", then may proceed with Cephalosporin use.       Outpatient Encounter Medications as of 05/14/2023  Medication Sig   albuterol (VENTOLIN HFA) 108 (90 Base) MCG/ACT inhaler Inhale 2 puffs into the lungs every 6 (six) hours as needed. For asthma   ALPRAZolam (XANAX) 0.25 MG tablet Take 1 tablet (0.25  mg total) by mouth at bedtime as needed for anxiety or sleep.   calcium carbonate (TUMS - DOSED IN MG ELEMENTAL CALCIUM) 500 MG chewable tablet Chew 3 tablets by mouth 3 (three) times daily as needed for indigestion or heartburn.   escitalopram (LEXAPRO) 10 MG tablet TAKE 1 TABLET BY MOUTH EVERY DAY   hydrochlorothiazide (MICROZIDE) 12.5 MG capsule Take 1 capsule (12.5 mg total) by mouth daily.   levothyroxine (SYNTHROID) 100 MCG tablet TAKE 1 TABLET BY MOUTH EVERY DAY BEFORE BREAKFAST   pantoprazole (PROTONIX) 40 MG tablet 1 tablet twice daily for 8 weeks   rOPINIRole (REQUIP) 3 MG tablet Take 1 tablet (3 mg total) by mouth at bedtime.   rosuvastatin (CRESTOR) 40 MG tablet Take 1 tablet (40 mg total) by mouth at bedtime. (Patient taking differently: Take 40 mg by mouth 2 (two) times a week.)   traZODone (DESYREL) 50 MG tablet Take 1-2 tablets (50-100 mg total) by mouth at bedtime as needed for sleep.   [DISCONTINUED] apixaban (ELIQUIS) 2.5 MG TABS tablet Take 1 tablet (2.5 mg total) by mouth 2 (  two) times daily.   [DISCONTINUED] Calcium Citrate-Vitamin D (CALCIUM + D PO) Take 1 tablet by mouth 2 (two) times a week.   [DISCONTINUED] famotidine (PEPCID) 40 MG tablet Take 1 tablet (40 mg total) by mouth daily. (Patient not taking: Reported on 02/12/2023)   [DISCONTINUED] pantoprazole (PROTONIX) 40 MG tablet Take 1 tablet (40 mg total) by mouth daily. (Patient not taking: Reported on 05/14/2023)   [DISCONTINUED] sucralfate (CARAFATE) 1 g tablet Take 1 g by mouth 2 (two) times daily as needed (heartburn).   No facility-administered encounter medications on file as of 05/14/2023.    REVIEW OF SYSTEMS  : All other systems reviewed and negative except where noted in the History of Present Illness.   PHYSICAL EXAM: BP 122/74   Pulse 88   Ht 5\' 2"  (1.575 m)   Wt 197 lb (89.4 kg)   BMI 36.03 kg/m  General: Well developed female in no acute distress Head: Normocephalic and atraumatic Eyes: Sclerae  anicteric, conjunctiva pink. Ears: Normal auditory acuity Musculoskeletal: Symmetrical with no gross deformities  Skin: No lesions on visible extremities Extremities: No edema; scars noted on B/L knees from TKAs  Neurological: Alert oriented x 4, grossly nonfocal Psychological:  Alert and cooperative. Normal mood and affect  ASSESSMENT AND PLAN: *62 year old female with complaints of GERD and intermittent issues of nausea and vomiting with associated large hiatal hernia.  She is on pantoprazole 40 mg twice daily.  She knows that she needs to be consistent with taking this and needs to make some changes to her diet, etc.  Specifically has issues with lying down at night.  Will add Pepcid 20 mg at bedtime.  Prescription sent to pharmacy.  She is not interested in hiatal hernia repair at this time.   CC:  Willow Ora, MD

## 2023-05-14 NOTE — Patient Instructions (Signed)
We have sent the following medications to your pharmacy for you to pick up at your convenience: Famotidine 20 mg nightly.   Follow up as needed.   _______________________________________________________  If your blood pressure at your visit was 140/90 or greater, please contact your primary care physician to follow up on this.  _______________________________________________________  If you are age 61 or older, your body mass index should be between 23-30. Your Body mass index is 36.03 kg/m. If this is out of the aforementioned range listed, please consider follow up with your Primary Care Provider.  If you are age 93 or younger, your body mass index should be between 19-25. Your Body mass index is 36.03 kg/m. If this is out of the aformentioned range listed, please consider follow up with your Primary Care Provider.   ________________________________________________________  The [place] [facility] providers would like to encourage you to use [facility] to communicate with providers for non-urgent requests or questions.  Due to long hold times on the telephone, sending your provider a message by [facility] may be a faster and more efficient way to get a response.  Please allow 48 business hours for a response.  Please remember that this is for non-urgent requests.  _______________________________________________________

## 2023-05-15 NOTE — Progress Notes (Signed)
I agree with the assessment and plan as outlined by Ms. Zehr. 

## 2023-05-16 ENCOUNTER — Telehealth: Payer: Self-pay

## 2023-05-16 NOTE — Telephone Encounter (Signed)
-----   Message from Leta Baptist, PA-C sent at 05/16/2023  1:07 PM EDT ----- Please let the patient know that I discussed with Dr. 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 Nothing to do from a medication standpoint. I typically do a follow up EGD to look for any additional polyps that may be present. If that follow up EGD looks good, then she doesn't need any further EGDs for surveillance. Her fundic gland polyps are completely benign.  Thank you,  Jess

## 2023-05-17 NOTE — Telephone Encounter (Signed)
Left message on machine to call back  

## 2023-05-20 NOTE — Telephone Encounter (Signed)
Left message on machine to call back  

## 2023-05-21 NOTE — Telephone Encounter (Signed)
Attempted to reach pt x3 without success.  I have mailed a letter to the pt home address  and sent to My Chart

## 2023-06-24 ENCOUNTER — Ambulatory Visit (INDEPENDENT_AMBULATORY_CARE_PROVIDER_SITE_OTHER): Payer: No Typology Code available for payment source | Admitting: Family Medicine

## 2023-06-24 ENCOUNTER — Encounter: Payer: Self-pay | Admitting: Family Medicine

## 2023-06-24 VITALS — BP 120/71 | HR 73 | Temp 98.3°F | Ht 62.0 in | Wt 194.0 lb

## 2023-06-24 DIAGNOSIS — I10 Essential (primary) hypertension: Secondary | ICD-10-CM | POA: Diagnosis not present

## 2023-06-24 DIAGNOSIS — Z9884 Bariatric surgery status: Secondary | ICD-10-CM | POA: Diagnosis not present

## 2023-06-24 DIAGNOSIS — F411 Generalized anxiety disorder: Secondary | ICD-10-CM

## 2023-06-24 DIAGNOSIS — E782 Mixed hyperlipidemia: Secondary | ICD-10-CM

## 2023-06-24 DIAGNOSIS — Z0000 Encounter for general adult medical examination without abnormal findings: Secondary | ICD-10-CM | POA: Diagnosis not present

## 2023-06-24 DIAGNOSIS — K2100 Gastro-esophageal reflux disease with esophagitis, without bleeding: Secondary | ICD-10-CM

## 2023-06-24 DIAGNOSIS — Z96651 Presence of right artificial knee joint: Secondary | ICD-10-CM | POA: Insufficient documentation

## 2023-06-24 DIAGNOSIS — G2581 Restless legs syndrome: Secondary | ICD-10-CM

## 2023-06-24 DIAGNOSIS — R011 Cardiac murmur, unspecified: Secondary | ICD-10-CM

## 2023-06-24 DIAGNOSIS — K449 Diaphragmatic hernia without obstruction or gangrene: Secondary | ICD-10-CM | POA: Insufficient documentation

## 2023-06-24 DIAGNOSIS — Z1231 Encounter for screening mammogram for malignant neoplasm of breast: Secondary | ICD-10-CM

## 2023-06-24 DIAGNOSIS — F5101 Primary insomnia: Secondary | ICD-10-CM

## 2023-06-24 DIAGNOSIS — E890 Postprocedural hypothyroidism: Secondary | ICD-10-CM

## 2023-06-24 LAB — CBC WITH DIFFERENTIAL/PLATELET
Basophils Absolute: 0 10*3/uL (ref 0.0–0.1)
Basophils Relative: 0.6 % (ref 0.0–3.0)
Eosinophils Absolute: 0.2 10*3/uL (ref 0.0–0.7)
Eosinophils Relative: 3.2 % (ref 0.0–5.0)
HCT: 43.3 % (ref 36.0–46.0)
Hemoglobin: 14.6 g/dL (ref 12.0–15.0)
Lymphocytes Relative: 32.7 % (ref 12.0–46.0)
Lymphs Abs: 2.2 10*3/uL (ref 0.7–4.0)
MCHC: 33.8 g/dL (ref 30.0–36.0)
MCV: 91.1 fl (ref 78.0–100.0)
Monocytes Absolute: 0.4 10*3/uL (ref 0.1–1.0)
Monocytes Relative: 5.4 % (ref 3.0–12.0)
Neutro Abs: 3.9 10*3/uL (ref 1.4–7.7)
Neutrophils Relative %: 58.1 % (ref 43.0–77.0)
Platelets: 263 10*3/uL (ref 150.0–400.0)
RBC: 4.75 Mil/uL (ref 3.87–5.11)
RDW: 13.3 % (ref 11.5–15.5)
WBC: 6.8 10*3/uL (ref 4.0–10.5)

## 2023-06-24 LAB — TSH: TSH: 44.7 u[IU]/mL — ABNORMAL HIGH (ref 0.35–5.50)

## 2023-06-24 LAB — VITAMIN B12: Vitamin B-12: 476 pg/mL (ref 211–911)

## 2023-06-24 LAB — VITAMIN D 25 HYDROXY (VIT D DEFICIENCY, FRACTURES): VITD: 29.85 ng/mL — ABNORMAL LOW (ref 30.00–100.00)

## 2023-06-24 MED ORDER — ROPINIROLE HCL 3 MG PO TABS: 3.0000 mg | ORAL_TABLET | Freq: Every day | ORAL | 3 refills | Status: DC

## 2023-06-24 NOTE — Progress Notes (Signed)
Subjective  Chief Complaint  Patient presents with   Annual Exam    Pt here for Annual Exam and is currently fasting    Hypertension    HPI: Alison [name] is a 62 y.o. female who presents to [facility] [place] today for a Female Wellness Visit. She also has the concerns and/or needs as listed above in the chief complaint. These will be addressed in addition to the Health Maintenance Visit.   Wellness Visit: annual visit with health maintenance review and exam without Pap  Health maintenance: Pap screen current, due again next year.  Colonoscopy due again next year.  Mammogram due in September and patient needs to schedule.  Overall she is doing very well. Chronic disease f/u and/or acute problem visit: (deemed necessary to be done in addition to the wellness visit): Hypertension: She brings her blood pressure cuff in today to check against ours.  Readings are consistent.  Home readings however are much better.  I reviewed her home blood pressure logs, she checks weekly.  Blood pressure average 120s over 70s.  She feels well on HCTZ 12.5 mg daily.  No edema.  No chest pain or shortness of breath.  No lightheadedness or palpitations. Long-term systolic murmur: She reports normal echocardiogram in 2012. Hyperlipidemia: She stopped her Crestor about 3 months ago due to GI related symptoms due to a large hiatal hernia found on EGD.  She is willing to restart. GERD with esophagitis and hiatal hernia: Reviewed GI notes.  On Protonix and Pepcid.  Surgical repair was recommended but patient defers.  She is managing with her current medications.  Although sometimes she will have to stop eating, digest and then eat a little bit more later.  Occasionally will have nausea and vomiting. Obesity s/p gastric band: Due for vitamin checks.  Weight is stable however she would like to lose weight.  She is inquiring about GLP-1 use.  She does have a history of PCOS Status post right knee  replacement in March.  Recovering very well.  Starting to walk for exercise Hypothyroidism: She feels her energy is good.  She is compliant with her medications.  Due for recheck.  Last TSH was just below normal. Anxiety disorder remains well-controlled on Lexapro 10. Restless leg disorder is well-controlled on ropinirole 3 mg nightly  Assessment  1. Annual physical exam   2. Screening mammogram for breast cancer   3. Essential hypertension   4. GAD (generalized anxiety disorder)   5. Mixed hyperlipidemia   6. Postablative hypothyroidism   7. Gastric banding status   8. Gastroesophageal reflux disease with esophagitis without hemorrhage   9. Primary insomnia   10. Restless legs syndrome (RLS)   11. Class 2 severe obesity with body mass index (BMI) of 35 to 39.9 with serious comorbidity (HCC) Chronic  12. Hiatal hernia   13. Systolic murmur   14. Status post right knee replacement      Plan  Female Wellness Visit: Age appropriate Health Maintenance and Prevention measures were discussed with patient. Included topics are cancer screening recommendations, ways to keep healthy (see AVS) including dietary and exercise recommendations, regular eye and dental care, use of seat belts, and avoidance of moderate alcohol use and tobacco use.  Pap smear colonoscopy will be due next year. BMI: discussed patient's BMI and encouraged positive lifestyle modifications to help get to or maintain a target BMI. HM needs and immunizations were addressed and ordered. See below for orders. See HM  and immunization section for updates. Routine labs and screening tests ordered including cmp, cbc and lipids where appropriate. Discussed recommendations regarding Vit D and calcium supplementation (see AVS)  Chronic disease management visit and/or acute problem visit: Hypertension: Well-controlled by home readings.  Continue HCTZ 12.5 mg daily.  Check renal function electrolytes.  Low-sodium diet. Obesity: We  discussed use of GLP-1's.  Due to slowing gastric emptying I would be hesitant to start.  She will discuss with her GI and then follow-up with me.  It would be helpful for her metabolically. Gastric banding status: Check vitamin levels Restless legs: Stable on ropinirole 3 mg nightly.  Refilled.  Check iron levels and CBC Hypothyroidism on levothyroxine at doses listed below.  Recheck TSH.  Adjust down dose if TSH remains below normal.  Patient agrees Continue Lexapro 10 mg for anxiety.  Well-controlled Hyperlipidemia: Patient would like to recheck her lipids today.  She will restart Crestor if indicated.  She will take in the morning instead of evening due to GI related symptoms at night.  This is reasonable. GERD: Continue Pepcid and Protonix per GI Murmur: 3/6 systolic murmur.  Check echocardiogram to assess talcs.  Patient agrees.  She is asymptomatic  Outpatient Encounter Medications as of 06/24/2023  Medication Sig   albuterol (VENTOLIN HFA) 108 (90 Base) MCG/ACT inhaler Inhale 2 puffs into the lungs every 6 (six) hours as needed. For asthma   ALPRAZolam (XANAX) 0.25 MG tablet Take 1 tablet (0.25 mg total) by mouth at bedtime as needed for anxiety or sleep.   calcium carbonate (TUMS - DOSED IN MG ELEMENTAL CALCIUM) 500 MG chewable tablet Chew 3 tablets by mouth 3 (three) times daily as needed for indigestion or heartburn.   escitalopram (LEXAPRO) 10 MG tablet TAKE 1 TABLET BY MOUTH EVERY DAY   famotidine (PEPCID) 20 MG tablet Take 1 tablet (20 mg total) by mouth at bedtime.   hydrochlorothiazide (MICROZIDE) 12.5 MG capsule Take 1 capsule (12.5 mg total) by mouth daily.   levothyroxine (SYNTHROID) 100 MCG tablet TAKE 1 TABLET BY MOUTH EVERY DAY BEFORE BREAKFAST   pantoprazole (PROTONIX) 40 MG tablet 1 tablet twice daily for 8 weeks   [DISCONTINUED] rOPINIRole (REQUIP) 3 MG tablet Take 1 tablet (3 mg total) by mouth at bedtime.   [DISCONTINUED] traZODone (DESYREL) 50 MG tablet Take 1-2 tablets  (50-100 mg total) by mouth at bedtime as needed for sleep.   rOPINIRole (REQUIP) 3 MG tablet Take 1 tablet (3 mg total) by mouth at bedtime.   rosuvastatin (CRESTOR) 40 MG tablet Take 1 tablet (40 mg total) by mouth at bedtime. (Patient not taking: Reported on 06/24/2023)   No facility-administered encounter medications on file as of 06/24/2023.    Follow up: 12 months for complete physical Orders Placed This Encounter  Procedures   VITAMIN D 25 Hydroxy (Vit-D Deficiency, Fractures)   CBC with Differential/Platelet   Comprehensive metabolic panel   Lipid panel   TSH   Iron, TIBC and Ferritin Panel   Vitamin B12   ECHOCARDIOGRAM COMPLETE   Meds ordered this encounter  Medications   rOPINIRole (REQUIP) 3 MG tablet    Sig: Take 1 tablet (3 mg total) by mouth at bedtime.    Dispense:  90 tablet    Refill:  3      Body mass index is 35.48 kg/m. Wt Readings from Last 3 Encounters:  06/24/23 194 lb (88 kg)  05/14/23 197 lb (89.4 kg)  02/25/23 196 lb (88.9 kg)  Patient Active Problem List   Diagnosis Date Noted Date Diagnosed   Hiatal hernia 06/24/2023     Priority: High    Large, in chest by EGD 2023, Dr. Leonides Schanz; pt declines surgical repair; symptomatic    Class 2 severe obesity with body mass index (BMI) of 35 to 39.9 with serious comorbidity (HCC) 03/02/2020     Priority: High   Postablative hypothyroidism 02/03/2020     Priority: High   Systolic murmur 09/29/2019     Priority: High    Normal echo 2012;  Louder: repeat in 2024    GAD (generalized anxiety disorder) 06/24/2018     Priority: High    Overview:  Did well on paxil but stopped due to weight gain; wellbutrin - failed after 6 months. stabilized on lexapro.    Mixed hyperlipidemia 06/24/2018     Priority: High   Essential hypertension 05/31/2018     Priority: High    Overview:  Nl Echo 02/2011    Family history of premature CAD 12/29/2013     Priority: High    Overview:  Father, 22 yo    History  of TIA (transient ischemic attack) 12/10/1980     Priority: High    When on OCPs    Gastroesophageal reflux disease 10/11/2020     Priority: Medium    S/P gastric sleeve procedure 10/11/2020     Priority: Medium    Mild intermittent asthma without complication 06/24/2018     Priority: Medium     Overview:  Negative sleep study, 04/2011    Primary osteoarthritis of both knees 09/11/2016     Priority: Medium     Overview:  Dr. Thurston Hole    Gastric banding status 10/07/2015     Priority: Medium     Removed 2021; converted to gastric sleeve    Restless legs syndrome (RLS) 02/14/2015     Priority: Medium    Insomnia 12/29/2013     Priority: Medium     Overview:  Failed lunesta and ambien and trazadone; Uses xanax rarely    Status post right knee replacement 06/24/2023     Priority: Low   Status post left knee replacement 01/04/2023     Priority: Low   Health Maintenance  Topic Date Due   COVID-19 Vaccine (7 - 2023-24 season) 07/10/2023 (Originally 11/29/2022)   INFLUENZA VACCINE  07/11/2023   MAMMOGRAM  08/21/2023   PAP SMEAR-Modifier  10/11/2025   Colonoscopy  10/24/2025   DTaP/Tdap/Td (3 - Td or Tdap) 06/22/2032   Hepatitis C Screening  Completed   HIV Screening  Completed   Zoster Vaccines- Shingrix  Completed   HPV VACCINES  Aged Out   Immunization History  Administered Date(s) Administered   Influenza, Quadrivalent, Recombinant, Inj, Pf 09/17/2019   Influenza, Seasonal, Injecte, Preservative Fre 08/20/2014, 09/12/2015   Influenza,inj,Quad PF,6+ Mos 09/11/2016, 09/25/2017, 09/24/2018, 09/18/2019, 10/11/2020, 01/19/2022   Moderna Covid-19 Vaccine Bivalent Booster 2yrs & up 10/04/2022   Moderna Sars-Covid-2 Vaccination 02/09/2020, 03/08/2020, 11/15/2020, 07/28/2021, 12/15/2021   Pneumococcal Polysaccharide-23 12/10/2012   Tdap 12/11/2011, 06/22/2022   Zoster Recombinant(Shingrix) 01/19/2022, 06/22/2022   We updated and reviewed the patient's past history in detail  and it is documented below. Allergies: Patient is allergic to aspirin, cherry, other, nsaids, soy allergy, and penicillins. Past Medical History Patient  has a past medical history of Anxiety, Arthritis, Asthma, Cervical myelopathy (HCC) (08/28/2018), Family history of adverse reaction to anesthesia, GERD (gastroesophageal reflux disease), Heart murmur, Hiatal hernia, TIA (transient ischemic attack) (1992), Hyperlipidemia,  Hypertension, Hypothyroidism, Obesity, Pneumonia, Polycystic ovarian syndrome, and Stroke (HCC). Past Surgical History Patient  has a past surgical history that includes Knee cartilage surgery (2005); Abdominal hysterectomy (2009); Tonsillectomy (2007); Femur fracture surgery (2008); Laparoscopic gastric banding (11/12/2011); I & D extremity (Left, 06/01/2018); Anterior cervical decompression/discectomy fusion 4 level (N/A, 08/28/2018); Laparoscopic gastric band removal with laparoscopic gastric sleeve resection (N/A, 05/03/2020); Fracture surgery; Diagnostic laparoscopy; Breast lumpectomy (Left, 06/27/2020); Total knee arthroplasty (Left, 10/31/2022); and Total knee arthroplasty (Right, 02/25/2023). Family History: Patient family history includes Anesthesia problems in her sister; Breast cancer (age of onset: 46) in her maternal aunt; Cancer in her maternal aunt; Colon polyps in her mother; Glaucoma in her mother; Heart disease in her father; Hyperthyroidism in her mother; Migraines in her sister; Myasthenia gravis in her paternal grandfather; Ovarian cancer in her maternal grandmother; Stroke in her father; Uterine cancer in her maternal grandmother. Social History:  Patient  reports that she has never smoked. She has never used smokeless tobacco. She reports that she does not currently use alcohol. She reports that she does not use drugs.  Review of Systems: Constitutional: negative for fever or malaise Ophthalmic: negative for photophobia, double vision or loss of  vision Cardiovascular: negative for chest pain, dyspnea on exertion, or new LE swelling Respiratory: negative for SOB or persistent cough Gastrointestinal: negative for abdominal pain, change in bowel habits or melena Genitourinary: negative for dysuria or gross hematuria, no abnormal uterine bleeding or disharge Musculoskeletal: negative for new gait disturbance or muscular weakness Integumentary: negative for new or persistent rashes, no breast lumps Neurological: negative for TIA or stroke symptoms Psychiatric: negative for SI or delusions Allergic/Immunologic: negative for hives  Patient Care Team    Relationship Specialty Notifications Start End  Willow Ora, MD PCP - General Family Medicine  06/24/18     Objective  Vitals: BP 120/71 Comment: by consistent home readings and verified cuff in office  Pulse 73   Temp 98.3 F (36.8 C)   Ht 5\' 2"  (1.575 m)   Wt 194 lb (88 kg)   SpO2 97%   BMI 35.48 kg/m  General:  Well developed, well nourished, no acute distress  Psych:  Alert and orientedx3,normal mood and affect HEENT:  Normocephalic, atraumatic, non-icteric sclera,  supple neck without adenopathy, mass or thyromegaly Cardiovascular:  Normal S1, S2, RRR 3/6 systolic murmur heard loudest at right upper sternal border, radiates to carotids Respiratory:  Good breath sounds bilaterally, CTAB with normal respiratory effort Gastrointestinal: normal bowel sounds, soft, non-tender, no noted masses. No HSM MSK: extremities without edema, joints without erythema or swelling Neurologic:    Mental status is normal.  Gross motor and sensory exams are normal.  No tremor  Commons side effects, risks, benefits, and alternatives for medications and treatment plan prescribed today were discussed, and the patient expressed understanding of the given instructions. Patient is instructed to call or message via MyChart if he/she has any questions or concerns regarding our treatment plan. No  barriers to understanding were identified. We discussed Red Flag symptoms and signs in detail. Patient expressed understanding regarding what to do in case of urgent or emergency type symptoms.  Medication list was reconciled, printed and provided to the patient in AVS. Patient instructions and summary information was reviewed with the patient as documented in the AVS. This note was prepared with assistance of Dragon voice recognition software. Occasional wrong-word or sound-a-like substitutions may have occurred due to the inherent limitations of voice recognition software

## 2023-06-24 NOTE — Patient Instructions (Signed)
Please return in 12 months for your annual complete physical; please come fasting.   I will release your lab results to you on your MyChart account with further instructions. You may see the results before I do, but when I review them I will send you a message with my report or have my assistant call you if things need to be discussed. Please reply to my message with any questions. Thank you!   If you have any questions or concerns, please don't hesitate to send me a message via MyChart or call the office at [phone]. Thank you for visiting with [place] today! It's our pleasure caring for you.   Please call the office checked below to schedule your appointment for your mammogram and/or bone density screen (the checked studies were ordered): [x]   Mammogram  []   Bone Density  [x]   [facility]     [address] [place], [place]        [phone]         []   [facility]  [address] [place], [place]  [phone]

## 2023-06-25 ENCOUNTER — Encounter: Payer: Self-pay | Admitting: Internal Medicine

## 2023-06-25 LAB — LIPID PANEL
Cholesterol: 348 mg/dL — ABNORMAL HIGH (ref 0–200)
HDL: 74 mg/dL (ref >39.00)
LDL Cholesterol: 258 mg/dL — ABNORMAL HIGH (ref 0–99)
NonHDL: 274.18
Total CHOL/HDL Ratio: 5
Triglycerides: 83 mg/dL (ref 0.0–149.0)
VLDL: 16.6 mg/dL (ref 0.0–40.0)

## 2023-06-25 LAB — COMPREHENSIVE METABOLIC PANEL
ALT: 13 U/L (ref 0–35)
AST: 16 U/L (ref 0–37)
Albumin: 4.1 g/dL (ref 3.5–5.2)
Alkaline Phosphatase: 87 U/L (ref 39–117)
BUN: 12 mg/dL (ref 6–23)
CO2: 26 mEq/L (ref 19–32)
Calcium: 9.9 mg/dL (ref 8.4–10.5)
Chloride: 99 mEq/L (ref 96–112)
Creatinine, Ser: 0.8 mg/dL (ref 0.40–1.20)
GFR: 79.6 mL/min (ref >60.00)
Glucose, Bld: 88 mg/dL (ref 70–99)
Potassium: 3.3 mEq/L — ABNORMAL LOW (ref 3.5–5.1)
Sodium: 135 mEq/L (ref 135–145)
Total Bilirubin: 0.4 mg/dL (ref 0.2–1.2)
Total Protein: 7.1 g/dL (ref 6.0–8.3)

## 2023-06-25 LAB — IRON,TIBC AND FERRITIN PANEL
%SAT: 23 % (calc) (ref 16–45)
Ferritin: 44 ng/mL (ref 16–288)
Iron: 78 ug/dL (ref 45–160)
TIBC: 336 mcg/dL (calc) (ref 250–450)

## 2023-06-25 MED ORDER — POTASSIUM CHLORIDE CRYS ER 20 MEQ PO TBCR: 20.0000 meq | EXTENDED_RELEASE_TABLET | Freq: Every day | ORAL | 3 refills | Status: DC

## 2023-06-25 NOTE — Progress Notes (Signed)
See mychart note Dear Ms. Harmes, Your lab results show a few things that need our attention.  Your potassium is running low on the hydrochlorothiazide.  I recommend taking a potassium supplement along with it daily.  I have ordered this for you. Your cholesterol numbers are extremely elevated again!  It is imperative that you restart your Crestor 40 mg daily.  Commend rechecking this in 3 months.  Please schedule an appointment.  The good news is your HDL or happy cholesterol is high which is protective.  Your cholesterol is partly high also because your thyroid is uncontrolled.  Did you stop your thyroid medication?  Please let me know so that I can adjust things for you.  We need to get your thyroid back under control.  Your vitamin levels and B12 levels and iron levels look fairly good.  Continue oral supplements. Sincerely, Dr. Mardelle Matte

## 2023-06-25 NOTE — Addendum Note (Signed)
Addended by: Asencion Partridge on: 06/25/2023 02:46 PM   Modules accepted: Orders

## 2023-06-26 ENCOUNTER — Encounter: Payer: Self-pay | Admitting: Family Medicine

## 2023-06-26 DIAGNOSIS — E890 Postprocedural hypothyroidism: Secondary | ICD-10-CM

## 2023-06-27 ENCOUNTER — Encounter: Payer: Self-pay | Admitting: Family Medicine

## 2023-06-27 MED ORDER — LEVOTHYROXINE SODIUM 125 MCG PO TABS: 125.0000 ug | ORAL_TABLET | Freq: Every day | ORAL | 0 refills | Status: DC

## 2023-07-26 ENCOUNTER — Encounter: Payer: Self-pay | Admitting: Family Medicine

## 2023-07-26 ENCOUNTER — Other Ambulatory Visit: Payer: Self-pay

## 2023-07-26 MED ORDER — ROSUVASTATIN CALCIUM 40 MG PO TABS: 40.0000 mg | ORAL_TABLET | Freq: Every day | ORAL | 3 refills | Status: DC

## 2023-08-05 ENCOUNTER — Encounter: Payer: Self-pay | Admitting: Family Medicine

## 2023-08-06 ENCOUNTER — Ambulatory Visit (HOSPITAL_COMMUNITY): Payer: No Typology Code available for payment source | Attending: Family Medicine

## 2023-08-06 DIAGNOSIS — R011 Cardiac murmur, unspecified: Secondary | ICD-10-CM | POA: Insufficient documentation

## 2023-08-06 DIAGNOSIS — I5030 Unspecified diastolic (congestive) heart failure: Secondary | ICD-10-CM | POA: Diagnosis not present

## 2023-08-06 DIAGNOSIS — I350 Nonrheumatic aortic (valve) stenosis: Secondary | ICD-10-CM

## 2023-08-06 LAB — ECHOCARDIOGRAM COMPLETE
AR max vel: 1.34 cm2
AV Area VTI: 1.31 cm2
AV Area mean vel: 1.29 cm2
AV Mean grad: 13 mmHg
AV Peak grad: 24.2 mmHg
Ao pk vel: 2.46 m/s
Area-P 1/2: 4.25 cm2
S' Lateral: 2.6 cm

## 2023-08-07 ENCOUNTER — Other Ambulatory Visit: Payer: Self-pay

## 2023-08-07 DIAGNOSIS — R931 Abnormal findings on diagnostic imaging of heart and coronary circulation: Secondary | ICD-10-CM

## 2023-08-07 DIAGNOSIS — E890 Postprocedural hypothyroidism: Secondary | ICD-10-CM

## 2023-08-07 DIAGNOSIS — R011 Cardiac murmur, unspecified: Secondary | ICD-10-CM

## 2023-08-07 DIAGNOSIS — E876 Hypokalemia: Secondary | ICD-10-CM

## 2023-08-07 MED ORDER — LEVOTHYROXINE SODIUM 150 MCG PO TABS: 150.0000 ug | ORAL_TABLET | Freq: Every day | ORAL | 3 refills | Status: DC

## 2023-08-07 NOTE — Progress Notes (Signed)
Please call patient:I have received her request for surgical clearance.  However, this can not be given until we get your thyroid levels back under control. I will need you to increase the dose of levothyroxine to daily and we will need to recheck in 6 weeks. Please order a tsh and schedule lab visit and order meds Alison [name].also order a BMP and ensure she has started her potassium supplement as instructed in July's result note. Dx hypokalemia.   Also, echocardiogram shows calcium on the aortic valve. I do not believe this is causing problems but need to have you see a cardiologist to review and then give cardiac clearance. Please schedule the cardiology consult for abnormal echocardiogram and murmur.  Once these things are cleared, you may proceed with surgery. Thanks!

## 2023-08-15 ENCOUNTER — Encounter: Payer: Self-pay | Admitting: Cardiovascular Disease

## 2023-08-15 ENCOUNTER — Ambulatory Visit
Payer: No Typology Code available for payment source | Attending: Cardiovascular Disease | Admitting: Cardiovascular Disease

## 2023-08-15 VITALS — BP 120/80 | HR 88 | Ht 62.0 in | Wt 187.0 lb

## 2023-08-15 DIAGNOSIS — Q231 Congenital insufficiency of aortic valve: Secondary | ICD-10-CM | POA: Diagnosis not present

## 2023-08-15 DIAGNOSIS — Z01818 Encounter for other preprocedural examination: Secondary | ICD-10-CM | POA: Diagnosis not present

## 2023-08-15 NOTE — Patient Instructions (Signed)
Medication Instructions:  Your physician recommends that you continue on your current medications as directed. Please refer to the Current Medication list given to you today.  *If you need a refill on your cardiac medications before your next appointment, please call your pharmacy*  Lab Work: If you have labs (blood work) drawn today and your tests are completely normal, you will receive your results only by: MyChart Message (if you have MyChart) OR A paper copy in the mail If you have any lab test that is abnormal or we need to change your treatment, we will call you to review the results.  Follow-Up: At [facility], you and your health needs are our priority.  As part of our continuing mission to provide you with exceptional heart care, we have created designated Provider Care Teams.  These Care Teams include your primary Cardiologist (physician) and Advanced Practice Providers (APPs -  Physician Assistants and Nurse Practitioners) who all work together to provide you with the care you need, when you need it.  We recommend signing up for the patient portal called "MyChart".  Sign up information is provided on this After Visit Summary.  MyChart is used to connect with patients for Virtual Visits (Telemedicine).  Patients are able to view lab/test results, encounter notes, upcoming appointments, etc.  Non-urgent messages can be sent to your provider as well.   To learn more about what you can do with MyChart, go to [email or web address].    Your next appointment:   1 year(s)  Provider:   Kristeen Miss, MD

## 2023-08-15 NOTE — Progress Notes (Signed)
  Cardiology Office Note:  .   Date:  08/15/2023  ID:  Alison [name], DOB January 01, 1961, MRN [id number] PCP: Willow Ora, MD  [name] Providers Cardiologist:  Marily Konczal  Click to update primary MD,subspecialty MD or APP then REFRESH:1}   History of Present Illness: .   Alison [name] is a 62 y.o. female with hx of HTN, HLD, obestiy, s/p gastric banding ,  hypothyroidism,  She was found to have a heart murmur Echocardiogram from August 06, 2023 reveals normal left ventricular systolic function with EF of 55 to 60%.  She has grade 1 diastolic dysfunction. Trivial mitral regurgitation Bicuspid aortic valve with moderate calcification of the aortic valve.  Trivial aortic insufficiency, mild aortic stenosis.  Mean aortic valve gradient is 13 mmHg.  Aortic root is normal sized.   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 2023 and April of 2024   She does quite a bit of yard work.  She push mows half an acre yard every week or so She also walks the dog on occasion    ROS:    Studies Reviewed: .        Nov. 15, 2023:   NSR .  NS TWI in V3, V4. ? Due to LVH  No changes compared to previous ECGs    Risk Assessment/Calculations:             Physical Exam:   VS:  BP 120/80   Pulse 88   Ht 5\' 2"  (1.575 m)   Wt 187 lb (84.8 kg)   SpO2 96%   BMI 34.20 kg/m    Wt Readings from Last 3 Encounters:  08/15/23 187 lb (84.8 kg)  06/24/23 194 lb (88 kg)  05/14/23 197 lb (89.4 kg)    GEN: Well nourished, well developed in no acute distress NECK: No JVD; No carotid bruits CARDIAC: RR 2/6 systolic murmur , radiating to the upper R sternal border and a 2nd murmur radiaing below her left breast  RESPIRATORY:  Clear to auscultation without rales, wheezing or rhonchi  ABDOMEN: Soft, non-tender, non-distended EXTREMITIES:   No edema; No deformity ,  radial pulses are 2+   ASSESSMENT AND PLAN: .     1.  Bicuspid aortic valve: [name] has known about her bicuspid valve for years.  She is very active.  She denies any chest pain or shortness of breath.  She does not have any limitations.  Her echocardiogram shows mild aortic stenosis with some calcification.  Her aortic root is normal size  We discussed the possibility that she would need to have this valve replaced at some point but certainly it does not appear that it needs replacing anytime soon.  2.  Preoperative evaluation: She would like to have some plastic surgery/liposuction.  She is able to work fairly vigorously was that without any problems.  She is at low risk for her upcoming surgery.  3.  Obesity:   she has lost 50 lbs so far  Encourage her to continue with her weight loss efforts        Dispo: 1 year   Signed, Kristeen Miss, MD

## 2023-09-17 ENCOUNTER — Other Ambulatory Visit (INDEPENDENT_AMBULATORY_CARE_PROVIDER_SITE_OTHER): Payer: No Typology Code available for payment source

## 2023-09-17 DIAGNOSIS — E890 Postprocedural hypothyroidism: Secondary | ICD-10-CM | POA: Diagnosis not present

## 2023-09-17 DIAGNOSIS — E876 Hypokalemia: Secondary | ICD-10-CM

## 2023-09-17 LAB — TSH: TSH: 0.01 u[IU]/mL — ABNORMAL LOW (ref 0.35–5.50)

## 2023-09-17 LAB — BASIC METABOLIC PANEL
BUN: 9 mg/dL (ref 6–23)
CO2: 24 meq/L (ref 19–32)
Calcium: 9.9 mg/dL (ref 8.4–10.5)
Chloride: 106 meq/L (ref 96–112)
Creatinine, Ser: 0.71 mg/dL (ref 0.40–1.20)
GFR: 91.71 mL/min (ref >60.00)
Glucose, Bld: 83 mg/dL (ref 70–99)
Potassium: 3.8 meq/L (ref 3.5–5.1)
Sodium: 139 meq/L (ref 135–145)

## 2023-09-18 NOTE — Progress Notes (Signed)
Please call patient:labs show thyroid is overtreated again.  Stop levothyroxine Order levothyroxine daily #90 with zero refills.  I want to see her in the office in 8 weeks please have her schedule with me.

## 2023-09-19 ENCOUNTER — Telehealth: Payer: Self-pay | Admitting: Family Medicine

## 2023-09-19 ENCOUNTER — Other Ambulatory Visit: Payer: Self-pay

## 2023-09-19 MED ORDER — LEVOTHYROXINE SODIUM 125 MCG PO TABS: 125.0000 ug | ORAL_TABLET | Freq: Every day | ORAL | 0 refills | Status: DC

## 2023-09-19 NOTE — Telephone Encounter (Signed)
Lvm requesting pt to cb to schedule a f/u with Dr.Andy in 8 weeks .

## 2023-09-23 ENCOUNTER — Other Ambulatory Visit: Payer: Self-pay | Admitting: Family Medicine

## 2023-09-25 ENCOUNTER — Other Ambulatory Visit: Payer: Self-pay | Admitting: Family Medicine

## 2023-09-25 DIAGNOSIS — F411 Generalized anxiety disorder: Secondary | ICD-10-CM

## 2023-11-18 ENCOUNTER — Observation Stay (HOSPITAL_COMMUNITY)
Admission: EM | Admit: 2023-11-18 | Discharge: 2023-11-21 | Disposition: A | Discharge status: Home / Self Care | Payer: No Typology Code available for payment source | Attending: Internal Medicine | Admitting: Internal Medicine

## 2023-11-18 ENCOUNTER — Emergency Department (HOSPITAL_COMMUNITY): Payer: No Typology Code available for payment source

## 2023-11-18 ENCOUNTER — Other Ambulatory Visit: Payer: Self-pay

## 2023-11-18 ENCOUNTER — Telehealth: Payer: Self-pay | Admitting: Family Medicine

## 2023-11-18 DIAGNOSIS — F32A Depression, unspecified: Secondary | ICD-10-CM | POA: Diagnosis not present

## 2023-11-18 DIAGNOSIS — R739 Hyperglycemia, unspecified: Secondary | ICD-10-CM | POA: Diagnosis not present

## 2023-11-18 DIAGNOSIS — Z7901 Long term (current) use of anticoagulants: Secondary | ICD-10-CM | POA: Diagnosis not present

## 2023-11-18 DIAGNOSIS — N63 Unspecified lump in unspecified breast: Secondary | ICD-10-CM | POA: Diagnosis not present

## 2023-11-18 DIAGNOSIS — R011 Cardiac murmur, unspecified: Secondary | ICD-10-CM | POA: Diagnosis present

## 2023-11-18 DIAGNOSIS — I10 Essential (primary) hypertension: Secondary | ICD-10-CM | POA: Diagnosis present

## 2023-11-18 DIAGNOSIS — I2699 Other pulmonary embolism without acute cor pulmonale: Principal | ICD-10-CM | POA: Insufficient documentation

## 2023-11-18 DIAGNOSIS — R0602 Shortness of breath: Secondary | ICD-10-CM | POA: Diagnosis present

## 2023-11-18 DIAGNOSIS — E785 Hyperlipidemia, unspecified: Secondary | ICD-10-CM | POA: Diagnosis not present

## 2023-11-18 DIAGNOSIS — R509 Fever, unspecified: Secondary | ICD-10-CM | POA: Insufficient documentation

## 2023-11-18 DIAGNOSIS — K219 Gastro-esophageal reflux disease without esophagitis: Secondary | ICD-10-CM | POA: Diagnosis not present

## 2023-11-18 DIAGNOSIS — E890 Postprocedural hypothyroidism: Secondary | ICD-10-CM | POA: Diagnosis present

## 2023-11-18 DIAGNOSIS — Z8673 Personal history of transient ischemic attack (TIA), and cerebral infarction without residual deficits: Secondary | ICD-10-CM | POA: Insufficient documentation

## 2023-11-18 DIAGNOSIS — Z96653 Presence of artificial knee joint, bilateral: Secondary | ICD-10-CM | POA: Insufficient documentation

## 2023-11-18 DIAGNOSIS — I82401 Acute embolism and thrombosis of unspecified deep veins of right lower extremity: Secondary | ICD-10-CM | POA: Insufficient documentation

## 2023-11-18 DIAGNOSIS — Z86711 Personal history of pulmonary embolism: Secondary | ICD-10-CM | POA: Diagnosis present

## 2023-11-18 LAB — CBC
HCT: 43.4 % (ref 36.0–46.0)
Hemoglobin: 14.1 g/dL (ref 12.0–15.0)
MCH: 29.6 pg (ref 26.0–34.0)
MCHC: 32.5 g/dL (ref 30.0–36.0)
MCV: 91.2 fL (ref 80.0–100.0)
Platelets: 201 10*3/uL (ref 150–400)
RBC: 4.76 MIL/uL (ref 3.87–5.11)
RDW: 12.5 % (ref 11.5–15.5)
WBC: 10.4 10*3/uL (ref 4.0–10.5)
nRBC: 0 % (ref 0.0–0.2)

## 2023-11-18 LAB — COMPREHENSIVE METABOLIC PANEL
ALT: 12 U/L (ref 0–44)
AST: 14 U/L — ABNORMAL LOW (ref 15–41)
Albumin: 4 g/dL (ref 3.5–5.0)
Alkaline Phosphatase: 78 U/L (ref 38–126)
Anion gap: 9 (ref 5–15)
BUN: 11 mg/dL (ref 8–23)
CO2: 24 mmol/L (ref 22–32)
Calcium: 9.9 mg/dL (ref 8.9–10.3)
Chloride: 105 mmol/L (ref 98–111)
Creatinine, Ser: 0.47 mg/dL (ref 0.44–1.00)
GFR, Estimated: >60 mL/min (ref >60)
Glucose, Bld: 87 mg/dL (ref 70–99)
Potassium: 4 mmol/L (ref 3.5–5.1)
Sodium: 138 mmol/L (ref 135–145)
Total Bilirubin: 0.9 mg/dL (ref <1.2)
Total Protein: 7.6 g/dL (ref 6.5–8.1)

## 2023-11-18 LAB — TROPONIN I (HIGH SENSITIVITY): Troponin I (High Sensitivity): 3 ng/L (ref <18)

## 2023-11-18 LAB — D-DIMER, QUANTITATIVE: D-Dimer, Quant: 5.86 ug{FEU}/mL — ABNORMAL HIGH (ref 0.00–0.50)

## 2023-11-18 MED ORDER — APIXABAN 5 MG PO TABS
10.0000 mg | ORAL_TABLET | Freq: Once | ORAL | Status: AC
  Administered 2023-11-18: 10 mg via ORAL
  Filled 2023-11-18: qty 2

## 2023-11-18 MED ORDER — ONDANSETRON HCL 4 MG/2ML IJ SOLN
4.0000 mg | Freq: Once | INTRAMUSCULAR | Status: AC
  Administered 2023-11-18: 4 mg via INTRAVENOUS
  Filled 2023-11-18: qty 2

## 2023-11-18 MED ORDER — MORPHINE SULFATE (PF) 4 MG/ML IV SOLN
4.0000 mg | Freq: Once | INTRAVENOUS | Status: AC
  Administered 2023-11-18: 4 mg via INTRAVENOUS
  Filled 2023-11-18: qty 1

## 2023-11-18 NOTE — ED Triage Notes (Signed)
Pt arrived via POV. C/o SOB and chest pain. Was seen yesterday in [facility]- dx with pulmonary embolism. Has personal things to take care of so could not be admitted there.

## 2023-11-18 NOTE — Telephone Encounter (Signed)
Spoke with pt and told her that she is advised to f/U with ER bc what she has going on is a very serous condition. Pt verbalized understanding.

## 2023-11-18 NOTE — ED Provider Notes (Signed)
[place] EMERGENCY DEPARTMENT AT [facility] Provider Note   CSN: [id number] Arrival date & time: 11/18/23  1810     History  Chief Complaint  Patient presents with   Shortness of Breath   pulmonary embolism   Chest Pain    Alison [name] is a 62 y.o. female.   Shortness of Breath Associated symptoms: chest pain   Chest Pain Associated symptoms: shortness of breath      Patient presented to the ED for evaluation of a pulmonary embolism.  Patient states she was down in [facility] for vacation.  Patient states she started having chest pain and shortness of breath last evening.  The pain was pleuritic on the right side of her chest.  Patient went to an emergency room there and had a CT scan that showed a pulmonary embolism.  Patient reports getting started on Eliquis.  The doctor wanted to admit her to a hospital however patient had her elderly mother with her and did not want to be admitted.  She still has been having some pleuritic cramping pain in her right chest.    Home Medications Prior to Admission medications   Medication Sig Start Date End Date Taking? Authorizing Provider  albuterol (VENTOLIN HFA) 108 (90 Base) MCG/ACT inhaler Inhale 2 puffs into the lungs every 6 (six) hours as needed. For asthma 06/22/22   Willow Ora, MD  ALPRAZolam Prudy Feeler) 0.25 MG tablet Take 1 tablet (0.25 mg total) by mouth at bedtime as needed for anxiety or sleep. 10/08/22   Willow Ora, MD  Blood Pressure Monitoring Soln KIT  04/16/23   [provider]  calcium carbonate (TUMS - DOSED IN MG ELEMENTAL CALCIUM) 500 MG chewable tablet Chew 3 tablets by mouth 3 (three) times daily as needed for indigestion or heartburn.    [provider]  escitalopram (LEXAPRO) 10 MG tablet TAKE 1 TABLET BY MOUTH EVERY DAY 09/25/23   Willow Ora, MD  famotidine (PEPCID) 20 MG tablet Take 1 tablet (20 mg total) by mouth at bedtime. 05/14/23   Zehr, Princella Pellegrini, PA-C   hydrochlorothiazide (MICROZIDE) 12.5 MG capsule Take 1 capsule (12.5 mg total) by mouth daily. 01/04/23   Willow Ora, MD  levothyroxine (SYNTHROID) 125 MCG tablet TAKE 1 TABLET BY MOUTH DAILY BEFORE BREAKFAST. 09/23/23   Willow Ora, MD  pantoprazole (PROTONIX) 40 MG tablet 1 tablet twice daily for 8 weeks Patient taking differently: Take 40 mg by mouth daily. 1 tablet twice daily for 8 weeks 01/23/23   Imogene Burn, MD  potassium chloride SA (KLOR-CON M) 20 MEQ tablet Take 1 tablet (20 mEq total) by mouth daily. 06/25/23   Willow Ora, MD  rOPINIRole (REQUIP) 3 MG tablet Take 1 tablet (3 mg total) by mouth at bedtime. 06/24/23   Willow Ora, MD  rosuvastatin (CRESTOR) 40 MG tablet Take 1 tablet (40 mg total) by mouth at bedtime. 07/26/23   Willow Ora, MD      Allergies    Aspirin, Cherry, Other, Nsaids, Soy allergy (do not select), and Penicillins    Review of Systems   Review of Systems  Respiratory:  Positive for shortness of breath.   Cardiovascular:  Positive for chest pain.    Physical Exam Updated Vital Signs BP 139/79   Pulse 100   Temp 99.3 F (37.4 C) (Oral)   Resp (!) 24   Ht 1.575 m (5\' 2" )   Wt 78.5 kg  SpO2 93%   BMI 31.64 kg/m  Physical Exam Vitals and nursing note reviewed.  Constitutional:      Appearance: She is well-developed. She is not diaphoretic.  HENT:     Head: Normocephalic and atraumatic.     Right Ear: External ear normal.     Left Ear: External ear normal.  Eyes:     General: No scleral icterus.       Right eye: No discharge.        Left eye: No discharge.     Conjunctiva/sclera: Conjunctivae normal.  Neck:     Trachea: No tracheal deviation.  Cardiovascular:     Rate and Rhythm: Normal rate and regular rhythm.  Pulmonary:     Effort: Pulmonary effort is normal. No respiratory distress.     Breath sounds: Normal breath sounds. No stridor. No wheezing or rales.  Abdominal:     General: Bowel sounds are normal. There  is no distension.     Palpations: Abdomen is soft.     Tenderness: There is no abdominal tenderness. There is no guarding or rebound.  Musculoskeletal:        General: No tenderness or deformity.     Cervical back: Neck supple.  Skin:    General: Skin is warm and dry.     Findings: No rash.  Neurological:     General: No focal deficit present.     Mental Status: She is alert.     Cranial Nerves: No cranial nerve deficit, dysarthria or facial asymmetry.     Sensory: No sensory deficit.     Motor: No abnormal muscle tone or seizure activity.     Coordination: Coordination normal.  Psychiatric:        Mood and Affect: Mood normal.     ED Results / Procedures / Treatments   Labs (all labs ordered are listed, but only abnormal results are displayed) Labs Reviewed  COMPREHENSIVE METABOLIC PANEL - Abnormal; Notable for the following components:      Result Value   AST 14 (*)    All other components within normal limits  D-DIMER, QUANTITATIVE - Abnormal; Notable for the following components:   D-Dimer, Quant 5.86 (*)    All other components within normal limits  CBC  TROPONIN I (HIGH SENSITIVITY)  TROPONIN I (HIGH SENSITIVITY)    EKG EKG Interpretation Date/Time:  Monday November 18 2023 19:23:44 EST Ventricular Rate:  88 PR Interval:  161 QRS Duration:  78 QT Interval:  309 QTC Calculation: 374 R Axis:   -9  Text Interpretation: Sinus rhythm Nonspecific T abnormalities, inferior leads No significant change since last tracing Confirmed by Linwood Dibbles [phone]) on 11/18/2023 10:59:14 PM  Radiology DG Chest Port 1 View  Result Date: 11/18/2023 CLINICAL DATA:  Known PEs, shortness of breath, right rib pain EXAM: PORTABLE CHEST 1 VIEW COMPARISON:  05/16/2021 FINDINGS: Patchy right lower lobe opacity. 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 Mild left basilar opacity, possibly atelectasis. No pleural  effusion or pneumothorax. The heart is normal in size. Cervical spine fixation hardware. 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 Electronically Signed   By: Charline Bills M.D.   On: 11/18/2023 20:13    Procedures Procedures    Medications Ordered in ED Medications  morphine (PF) 4 MG/ML injection 4 mg (4 mg Intravenous Given 11/18/23 2100)  ondansetron (ZOFRAN) injection 4 mg (  4 mg Intravenous Given 11/18/23 2100)  apixaban (ELIQUIS) tablet 10 mg (10 mg Oral Given 11/18/23 2140)  morphine (PF) 4 MG/ML injection 4 mg (4 mg Intravenous Given 11/18/23 2212)    ED Course/ Medical Decision Making/ A&P Clinical Course as of 11/18/23 2300  Mon Nov 18, 2023  2228 Patient's troponin is 3. [JK]  2228 Her CT scan shows multiple pulmonary emboli in the right lung with a wedge-shaped groundglass airspace opacity in the right lung base concerning for pulmonary infarct.  There is no evidence of heart strain [JK]  2300 Case discussed with Dr. Julian Reil [JK]    Clinical Course User Index [JK] Linwood Dibbles, MD                                 Medical Decision Making Problems Addressed: Other acute pulmonary embolism without acute cor pulmonale [facility]): acute illness or injury that poses a threat to life or bodily functions  Amount and/or Complexity of Data Reviewed Labs: ordered. Decision-making details documented in ED Course.  Risk Prescription drug management.   Patient presented to the ED for evaluation of persistent pain associated with pulmonary embolism.  Patient was seen in another medical facility and had a CT scan performed yesterday evening.  Showed multiple pulmonary emboli in the right lung with a wedge-shaped groundglass airspace opacity in the right lung base concerning for pulmonary infarct.  There is no evidence of right heart strain.  Patient also was noted to have a slightly asymmetric nodular soft tissue  density in the right upper outer quadrant of the breast that was nonspecific.  Recommendation for correlation with mammography.  Patient is not hypoxic here but she is continue to have pleuritic chest pain she has required IV doses of narcotics.  Etiology of her pulmonary embolism is also unclear.  I do think she would benefit from admission to the hospital for further evaluation.  I will consult the medical service.        Final Clinical Impression(s) / ED Diagnoses Final diagnoses:  Other acute pulmonary embolism without acute cor pulmonale [facility])    Rx / DC Orders ED Discharge Orders     None         Linwood Dibbles, MD 11/18/23 2300

## 2023-11-18 NOTE — ED Notes (Signed)
Pt brought in from triage due to sob and severe pain in right side of abdomen. Asher Muir, PA at bedside and orders will be placed

## 2023-11-18 NOTE — Telephone Encounter (Signed)
Pt called stating she is currently in [facility], [place] and went to the ED where she was diagnosed with 3 blood clots in the outer part of her lung. States they prescribed her 2 tabs of Eliquis BID and has been taking tylenol for pain prn. States they wanted to admit her to hospital down there but she declined due to her dog being in the car and her mother being [place] the resort. Pt informed me that her son is coming down to pick her up and take her back to [employer]. Patient is requesting advice on if PCP wants her to admit herself to the hospital upon arrival back home or do something else. Please advise.

## 2023-11-19 ENCOUNTER — Observation Stay (HOSPITAL_BASED_OUTPATIENT_CLINIC_OR_DEPARTMENT_OTHER): Payer: No Typology Code available for payment source

## 2023-11-19 DIAGNOSIS — I10 Essential (primary) hypertension: Secondary | ICD-10-CM | POA: Diagnosis not present

## 2023-11-19 DIAGNOSIS — Z86711 Personal history of pulmonary embolism: Secondary | ICD-10-CM | POA: Diagnosis present

## 2023-11-19 DIAGNOSIS — I2609 Other pulmonary embolism with acute cor pulmonale: Secondary | ICD-10-CM | POA: Diagnosis not present

## 2023-11-19 DIAGNOSIS — N63 Unspecified lump in unspecified breast: Secondary | ICD-10-CM | POA: Diagnosis present

## 2023-11-19 DIAGNOSIS — R011 Cardiac murmur, unspecified: Secondary | ICD-10-CM

## 2023-11-19 DIAGNOSIS — E890 Postprocedural hypothyroidism: Secondary | ICD-10-CM

## 2023-11-19 DIAGNOSIS — I2699 Other pulmonary embolism without acute cor pulmonale: Secondary | ICD-10-CM | POA: Diagnosis not present

## 2023-11-19 LAB — URINALYSIS, ROUTINE W REFLEX MICROSCOPIC
Bilirubin Urine: NEGATIVE
Glucose, UA: NEGATIVE mg/dL
Hgb urine dipstick: NEGATIVE
Ketones, ur: NEGATIVE mg/dL
Leukocytes,Ua: NEGATIVE
Nitrite: NEGATIVE
Protein, ur: NEGATIVE mg/dL
Specific Gravity, Urine: 1.032 — ABNORMAL HIGH (ref 1.005–1.030)
pH: 5 (ref 5.0–8.0)

## 2023-11-19 LAB — ECHOCARDIOGRAM COMPLETE
AR max vel: 1.55 cm2
AV Area VTI: 1.38 cm2
AV Area mean vel: 1.38 cm2
AV Mean grad: 14 mm[Hg]
AV Peak grad: 22.9 mm[Hg]
Ao pk vel: 2.4 m/s
Area-P 1/2: 3.99 cm2
Height: 62 in
S' Lateral: 2.5 cm
Weight: 2768 [oz_av]

## 2023-11-19 LAB — CBC
HCT: 38.3 % (ref 36.0–46.0)
Hemoglobin: 12.6 g/dL (ref 12.0–15.0)
MCH: 29.9 pg (ref 26.0–34.0)
MCHC: 32.9 g/dL (ref 30.0–36.0)
MCV: 91 fL (ref 80.0–100.0)
Platelets: 195 10*3/uL (ref 150–400)
RBC: 4.21 MIL/uL (ref 3.87–5.11)
RDW: 12.8 % (ref 11.5–15.5)
WBC: 9.8 10*3/uL (ref 4.0–10.5)
nRBC: 0 % (ref 0.0–0.2)

## 2023-11-19 LAB — BASIC METABOLIC PANEL
Anion gap: 9 (ref 5–15)
BUN: 15 mg/dL (ref 8–23)
CO2: 24 mmol/L (ref 22–32)
Calcium: 9.2 mg/dL (ref 8.9–10.3)
Chloride: 104 mmol/L (ref 98–111)
Creatinine, Ser: 0.7 mg/dL (ref 0.44–1.00)
GFR, Estimated: >60 mL/min (ref >60)
Glucose, Bld: 93 mg/dL (ref 70–99)
Potassium: 4.1 mmol/L (ref 3.5–5.1)
Sodium: 137 mmol/L (ref 135–145)

## 2023-11-19 LAB — TROPONIN I (HIGH SENSITIVITY): Troponin I (High Sensitivity): 3 ng/L (ref <18)

## 2023-11-19 LAB — HIV ANTIBODY (ROUTINE TESTING W REFLEX): HIV Screen 4th Generation wRfx: NONREACTIVE

## 2023-11-19 LAB — TSH: TSH: 0.02 u[IU]/mL — ABNORMAL LOW (ref 0.350–4.500)

## 2023-11-19 MED ORDER — HYDROXYZINE HCL 25 MG PO TABS
50.0000 mg | ORAL_TABLET | Freq: Three times a day (TID) | ORAL | Status: DC | PRN
  Administered 2023-11-19 – 2023-11-21 (×2): 50 mg via ORAL
  Filled 2023-11-19 (×2): qty 2

## 2023-11-19 MED ORDER — ACETAMINOPHEN 650 MG RE SUPP: 650.0000 mg | Freq: Four times a day (QID) | RECTAL | Status: DC | PRN

## 2023-11-19 MED ORDER — APIXABAN 5 MG PO TABS: 5.0000 mg | ORAL_TABLET | Freq: Two times a day (BID) | ORAL | Status: DC

## 2023-11-19 MED ORDER — ROPINIROLE HCL 1 MG PO TABS
3.0000 mg | ORAL_TABLET | Freq: Every day | ORAL | Status: DC
  Administered 2023-11-19 – 2023-11-20 (×2): 3 mg via ORAL
  Filled 2023-11-19 (×2): qty 3

## 2023-11-19 MED ORDER — SODIUM CHLORIDE 0.9 % IV SOLN: INTRAVENOUS | Status: AC

## 2023-11-19 MED ORDER — ESCITALOPRAM OXALATE 10 MG PO TABS
10.0000 mg | ORAL_TABLET | Freq: Every day | ORAL | Status: DC
  Administered 2023-11-19 – 2023-11-21 (×3): 10 mg via ORAL
  Filled 2023-11-19 (×3): qty 1

## 2023-11-19 MED ORDER — MORPHINE SULFATE (PF) 2 MG/ML IV SOLN
2.0000 mg | INTRAVENOUS | Status: DC | PRN
  Administered 2023-11-19: 2 mg via INTRAVENOUS
  Filled 2023-11-19: qty 1

## 2023-11-19 MED ORDER — ACETAMINOPHEN 325 MG PO TABS
650.0000 mg | ORAL_TABLET | Freq: Four times a day (QID) | ORAL | Status: DC | PRN
  Administered 2023-11-19 – 2023-11-20 (×5): 650 mg via ORAL
  Filled 2023-11-19 (×5): qty 2

## 2023-11-19 MED ORDER — FAMOTIDINE 20 MG PO TABS
20.0000 mg | ORAL_TABLET | Freq: Every day | ORAL | Status: DC
  Administered 2023-11-19 – 2023-11-20 (×2): 20 mg via ORAL
  Filled 2023-11-19 (×2): qty 1

## 2023-11-19 MED ORDER — APIXABAN 5 MG PO TABS
10.0000 mg | ORAL_TABLET | Freq: Two times a day (BID) | ORAL | Status: DC
  Administered 2023-11-19 – 2023-11-21 (×5): 10 mg via ORAL
  Filled 2023-11-19 (×5): qty 2

## 2023-11-19 MED ORDER — OXYCODONE HCL 5 MG PO TABS
5.0000 mg | ORAL_TABLET | ORAL | Status: DC | PRN
  Administered 2023-11-19: 5 mg via ORAL
  Filled 2023-11-19: qty 2

## 2023-11-19 MED ORDER — ONDANSETRON HCL 4 MG PO TABS: 4.0000 mg | ORAL_TABLET | Freq: Four times a day (QID) | ORAL | Status: DC | PRN

## 2023-11-19 MED ORDER — ONDANSETRON HCL 4 MG/2ML IJ SOLN: 4.0000 mg | Freq: Four times a day (QID) | INTRAMUSCULAR | Status: DC | PRN

## 2023-11-19 MED ORDER — ROSUVASTATIN CALCIUM 10 MG PO TABS
40.0000 mg | ORAL_TABLET | Freq: Every day | ORAL | Status: DC
  Administered 2023-11-19 – 2023-11-20 (×2): 40 mg via ORAL
  Filled 2023-11-19 (×2): qty 4

## 2023-11-19 MED ORDER — ALBUTEROL SULFATE (2.5 MG/3ML) 0.083% IN NEBU: 2.5000 mg | INHALATION_SOLUTION | Freq: Four times a day (QID) | RESPIRATORY_TRACT | Status: DC | PRN

## 2023-11-19 MED ORDER — ALBUTEROL SULFATE HFA 108 (90 BASE) MCG/ACT IN AERS: 2.0000 | INHALATION_SPRAY | Freq: Four times a day (QID) | RESPIRATORY_TRACT | Status: DC | PRN

## 2023-11-19 MED ORDER — LEVOTHYROXINE SODIUM 125 MCG PO TABS
125.0000 ug | ORAL_TABLET | Freq: Every day | ORAL | Status: DC
Start: 2023-11-19 — End: 2023-11-20
  Administered 2023-11-19 – 2023-11-20 (×2): 125 ug via ORAL
  Filled 2023-11-19 (×2): qty 1

## 2023-11-19 NOTE — Assessment & Plan Note (Signed)
Doesn't look very well controlled at the moment with TSH all over the map in recent months. Ill get a repeat TSH Reorder synthroid depending on what TSH comes back as.

## 2023-11-19 NOTE — ED Notes (Signed)
MD was notified of pts BP.

## 2023-11-19 NOTE — Progress Notes (Signed)
I came by pt's room in the ED to educate her on Eliquis. 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  I provided her with a 30-day free Eliquis coupon and information sheet on Eliquis.   Dorna Leitz, PharmD, BCPS 11/19/2023 10:34 AM

## 2023-11-19 NOTE — Progress Notes (Signed)
Bilateral lower extremity venous duplex has been completed. Preliminary results can be found in CV Proc through chart review.  Results were given to Dr. Jarvis Newcomer.  11/19/23 9:30 AM Olen Cordial RVT

## 2023-11-19 NOTE — Progress Notes (Signed)
  Echocardiogram 2D Echocardiogram has been performed.  Leda Roys RDCS 11/19/2023, 2:30 PM

## 2023-11-19 NOTE — Progress Notes (Signed)
[facility] PROGRESS NOTE  Alison [name] (DOB: September 03, 1961) ZOX:[id number] PCP: Willow Ora, MD  Brief Narrative: Alison [name] is a 62 y.o. female with a history of HTN, HLD, hypothyroidism, bicuspid aortic valve, and many surgeries who presented to the ED on 11/18/2023 with PE. She had driven to [facility] [place], developed leg cramping and abrupt onset right pleuritic chest pain. Went to ED where segmental PE's on right were confirmed by CT (per faxed radiology report) without RV strain. Admission was recommended, though she opted to drive back home here to the ED after confirming her elderly mother with dementia was cared for. She was not hypoxic, not in respiratory distress, had normal vital signs, but still had pain after IV morphine. So she was admitted early this morning by Dr. Julian Reil. Echo, LE venous U/S pending.   Subjective: Pain is improved with morphine. Amenable to attempting oxycodone. She's a Engineer, civil (consulting), works telephonically and wants to know how long it might be until she can speak regularly.   Objective: BP (!) 90/50   Pulse 76   Temp 99.9 F (37.7 C) (Oral)   Resp 18   Ht 5\' 2"  (1.575 m)   Wt 78.5 kg   SpO2 94%   BMI 31.64 kg/m   Gen: No distress, very pleasant Pulm: Clear, diminished without crackles or wheezes, limited excursions.  CV: RRR, rate in 70's, III/VI early systolic murmur at base, no rub or gallop, no pitting edema or JVD. GI: Soft, NT, ND, +BS  Neuro: Alert and oriented. No new focal deficits. Ext: Warm, dry, Right bunion surgery, bilateral knee replacement surgery scars noted, healed. TMT bossing on R > L feet.  Skin: No acute-appearing rashes, lesions or ulcers on visualized skin   Assessment & Plan: Acute provoked right segmental PE:  - Continue eliquis - Echo, venous U/S.  - Evidence of pulmonary infarct noted, the cause of her pain. Give oxycodone and if needed morphine for refractory pain. This, we discussed, is to facilitate her  mobility/incentive spirometry use.   Hypotension: Without tachycardia, overt bleeding, source for sepsis, etc. suspect morphine is contributing.  - Give 1L NS over 10 hours and monitor closely.   Ordered home medicines.  Right breast nodular density: Seen on CT.  - Pt well aware and will pursue mammogram thru her PCP after hospitalization. It is odd that she's had many surgeries and no previous DVTs.   Tyrone Nine, MD [facility] [email or web address] 11/19/2023, 9:08 AM

## 2023-11-19 NOTE — Assessment & Plan Note (Signed)
Cont home BP meds once med rec completed ?

## 2023-11-19 NOTE — Assessment & Plan Note (Addendum)
Noted as present on CTA of chest done in [facility] earlier today. Pt is already aware of this finding and that she needs follow up diagnostic mammogram following this admission.

## 2023-11-19 NOTE — H&P (Signed)
History and Physical    Patient: Alison [name] VWU:[id number] DOB: [birth year] DOA: 11/18/2023 DOS: the patient was seen and examined on 11/19/2023 PCP: Willow Ora, MD  Patient coming from: Home  Chief Complaint:  Chief Complaint  Patient presents with   Shortness of Breath   pulmonary embolism   Chest Pain   HPI: Alison [name] is a 62 y.o. female with medical history significant of bicuspid aortic valve, HTN, hypothyroidism, HLD.  Pt in to ED in [place] earlier in day today with onset of R chest pain.  Pleuritic.  Worse with deep breaths or movement.  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  Pt given eliquis, admit to hospital recommended at that time but pt chose to drive herself back to [place].  Pt into ED here in [place] at recommendation of PCP.   Review of Systems: As mentioned in the history of present illness. All other systems reviewed and are negative. Past Medical History:  Diagnosis Date   Anxiety    Arthritis    LEFT KNEE   Asthma    Cervical myelopathy (HCC) 08/28/2018   GERD (gastroesophageal reflux disease)    Heart murmur    Hiatal hernia    Hx-TIA (transient ischemic attack) 1982   Hyperlipidemia    Hypertension    not on meds for 2 mos at preop on 02/13/23   Hypothyroidism    Obesity    Pneumonia    Polycystic ovarian syndrome    Past Surgical History:  Procedure Laterality Date   ABDOMINAL HYSTERECTOMY  2009   Partial   ANTERIOR CERVICAL DECOMPRESSION/DISCECTOMY FUSION 4 LEVELS N/A 08/28/2018   Procedure: ANTERIOR CERVICAL DECOMPRESSION FUSION, CERVICAL 4-5, CERVICAL 5-6, CERVICAL 6-7 WITH INSTRUMENTATION AND ALLOGRAFT;  Surgeon: Estill Bamberg, MD;  Location: [facility] OR;  Service: Orthopedics;  Laterality: N/A;   BREAST LUMPECTOMY Left 06/27/2020   Procedure: LEFT BREAST LUMPECTOMY;  Surgeon: Abigail Miyamoto, MD;  Location: [facility] OR;  Service: General;  Laterality: Left;  LMA   DIAGNOSTIC  LAPAROSCOPY     FEMUR FRACTURE SURGERY  2008   Left   FRACTURE SURGERY     I & D EXTREMITY Left 06/01/2018   Procedure: IRRIGATION AND DEBRIDEMENT LEFT CALF;  Surgeon: Kathryne Hitch, MD;  Location: [facility] OR;  Service: Orthopedics;  Laterality: Left;   KNEE CARTILAGE SURGERY  2005   LAPAROSCOPIC GASTRIC BAND REMOVAL WITH LAPAROSCOPIC GASTRIC SLEEVE RESECTION N/A 05/03/2020   Procedure: LAPAROSCOPIC GASTRIC BAND REMOVAL WITH LAPAROSCOPIC GASTRIC SLEEVE RESECTION; UPPER ENDO AND ERAS PATHWAY;  Surgeon: Kinsinger, De Blanch, MD;  Location: [facility] ORS;  Service: General;  Laterality: N/A;   LAPAROSCOPIC GASTRIC BANDING  11/12/2011   Procedure: LAPAROSCOPIC GASTRIC BANDING;  Surgeon: Rulon Abide, DO;  Location: [facility] ORS;  Service: General;  Laterality: N/A;  nathanson liver retractor/ endostitch available 2-0 surgidac refills/ layton needle drivers available/ [facility] Doctor to proctor   TONSILLECTOMY  2007   TOTAL KNEE ARTHROPLASTY Left 10/31/2022   Procedure: TOTAL KNEE ARTHROPLASTY;  Surgeon: Joen Laura, MD;  Location: [facility] ORS;  Service: Orthopedics;  Laterality: Left;   TOTAL KNEE ARTHROPLASTY Right 02/25/2023   Procedure: TOTAL KNEE ARTHROPLASTY;  Surgeon: Joen Laura, MD;  Location: [facility] ORS;  Service: Orthopedics;  Laterality: Right;   Social History:  reports that she has never smoked. She has never used smokeless tobacco. She reports that she does not currently use alcohol.  She reports that she does not use drugs.  Allergies  Allergen Reactions   Aspirin Anaphylaxis   Cherry Hives and Shortness Of Breath   Other Hives and Itching    Walnuts and pecans - cause itching and hives in mouth   Nsaids Other (See Comments)    Per patient, Drs have told her not to take nsaids because she was a bad reaction to aspirin   Soy Allergy (Do Not Select) Hives    " hives in mouth and wheezing"    Penicillins Itching    Has patient had a PCN reaction causing immediate rash,  facial/tongue/throat swelling, SOB or lightheadedness with hypotension: No Has patient had a PCN reaction causing severe rash involving mucus membranes or skin necrosis: No Has patient had a PCN reaction that required hospitalization: No Has patient had a PCN reaction occurring within the last 10 years: No If all of the above answers are "NO", then may proceed with Cephalosporin use.     Family History  Problem Relation Age of Onset   Colon polyps Mother    Glaucoma Mother    Hyperthyroidism Mother    Heart disease Father    Stroke Father    Anesthesia problems Sister    Migraines Sister    Cancer Maternal Aunt        Breast Cancer   Breast cancer Maternal Aunt 37   Ovarian cancer Maternal Grandmother    Uterine cancer Maternal Grandmother    Myasthenia gravis Paternal Grandfather    Colon cancer Neg Hx    Esophageal cancer Neg Hx    Rectal cancer Neg Hx    Stomach cancer Neg Hx     Prior to Admission medications   Medication Sig Start Date End Date Taking? Authorizing Provider  albuterol (VENTOLIN HFA) 108 (90 Base) MCG/ACT inhaler Inhale 2 puffs into the lungs every 6 (six) hours as needed. For asthma 06/22/22  Yes Willow Ora, MD  calcium carbonate (TUMS - DOSED IN MG ELEMENTAL CALCIUM) 500 MG chewable tablet Chew 3 tablets by mouth 3 (three) times daily as needed for indigestion or heartburn.   Yes [provider]  ELIQUIS 5 MG TABS tablet Take 5 mg by mouth 2 (two) times daily. 11/18/23  Yes [provider]  escitalopram (LEXAPRO) 10 MG tablet TAKE 1 TABLET BY MOUTH EVERY DAY 09/25/23  Yes Willow Ora, MD  famotidine (PEPCID) 20 MG tablet Take 1 tablet (20 mg total) by mouth at bedtime. 05/14/23  Yes Zehr, Princella Pellegrini, PA-C  hydrochlorothiazide (MICROZIDE) 12.5 MG capsule Take 1 capsule (12.5 mg total) by mouth daily. Patient taking differently: Take 12.5 mg by mouth every other day. 01/04/23  Yes Willow Ora, MD  levothyroxine (SYNTHROID) 125 MCG  tablet TAKE 1 TABLET BY MOUTH DAILY BEFORE BREAKFAST. 09/23/23  Yes Willow Ora, MD  rOPINIRole (REQUIP) 3 MG tablet Take 1 tablet (3 mg total) by mouth at bedtime. 06/24/23  Yes Willow Ora, MD  rosuvastatin (CRESTOR) 40 MG tablet Take 1 tablet (40 mg total) by mouth at bedtime. 07/26/23  Yes Willow Ora, MD  ALPRAZolam Prudy Feeler) 0.25 MG tablet Take 1 tablet (0.25 mg total) by mouth at bedtime as needed for anxiety or sleep. Patient not taking: Reported on 11/18/2023 10/08/22   Willow Ora, MD  Blood Pressure Monitoring Soln KIT  04/16/23   [provider]  pantoprazole (PROTONIX) 40 MG tablet 1 tablet twice daily for 8 weeks Patient taking differently: Take  40 mg by mouth daily. 1 tablet twice daily for 8 weeks 01/23/23   Imogene Burn, MD  potassium chloride SA (KLOR-CON M) 20 MEQ tablet Take 1 tablet (20 mEq total) by mouth daily. Patient not taking: Reported on 11/18/2023 06/25/23   Willow Ora, MD    Physical Exam: Vitals:   11/19/23 0000 11/19/23 0200 11/19/23 0315 11/19/23 0345  BP: 101/76 115/75 112/71   Pulse: 87 78 82   Resp: (!) 22 20 19    Temp: 99.8 F (37.7 C)   99.6 F (37.6 C)  TempSrc: Oral   Oral  SpO2: 93% 94% 91%   Weight:      Height:       Constitutional: NAD, calm, comfortable Respiratory: clear to auscultation bilaterally, no wheezing, no crackles. Normal respiratory effort. No accessory muscle use.  Cardiovascular: Regular rate and rhythm, no murmurs / rubs / gallops. No extremity edema. 2+ pedal pulses. No carotid bruits.  Abdomen: no tenderness, no masses palpated. No hepatosplenomegaly. Bowel sounds positive.  Neurologic: CN 2-12 grossly intact. Sensation intact, DTR normal. Strength 5/5 in all 4.  Psychiatric: Normal judgment and insight. Alert and oriented x 3. Normal mood.   Data Reviewed:    Labs on Admission: I have personally reviewed following labs and imaging studies  CBC: Recent Labs  Lab 11/18/23 1941  WBC 10.4  HGB  14.1  HCT 43.4  MCV 91.2  PLT 201   Basic Metabolic Panel: Recent Labs  Lab 11/18/23 1941  NA 138  K 4.0  CL 105  CO2 24  GLUCOSE 87  BUN 11  CREATININE 0.47  CALCIUM 9.9   GFR: Estimated Creatinine Clearance: 71.7 mL/min (by C-G formula based on SCr of 0.47 mg/dL). Liver Function Tests: Recent Labs  Lab 11/18/23 1941  AST 14*  ALT 12  ALKPHOS 78  BILITOT 0.9  PROT 7.6  ALBUMIN 4.0   No results for input(s): "LIPASE", "AMYLASE" in the last 168 hours. No results for input(s): "AMMONIA" in the last 168 hours. Coagulation Profile: No results for input(s): "INR", "PROTIME" in the last 168 hours. Cardiac Enzymes: No results for input(s): "CKTOTAL", "CKMB", "CKMBINDEX", "TROPONINI" in the last 168 hours. BNP (last 3 results) No results for input(s): "PROBNP" in the last 8760 hours. HbA1C: No results for input(s): "HGBA1C" in the last 72 hours. CBG: No results for input(s): "GLUCAP" in the last 168 hours. Lipid Profile: No results for input(s): "CHOL", "HDL", "LDLCALC", "TRIG", "CHOLHDL", "LDLDIRECT" in the last 72 hours. Thyroid Function Tests: No results for input(s): "TSH", "T4TOTAL", "FREET4", "T3FREE", "THYROIDAB" in the last 72 hours. Anemia Panel: No results for input(s): "VITAMINB12", "FOLATE", "FERRITIN", "TIBC", "IRON", "RETICCTPCT" in the last 72 hours. Urine analysis:    Component Value Date/Time   COLORURINE YELLOW 08/28/2018 1003   APPEARANCEUR HAZY (A) 08/28/2018 1003   LABSPEC 1.020 08/28/2018 1003   PHURINE 6.0 08/28/2018 1003   GLUCOSEU NEGATIVE 08/28/2018 1003   HGBUR NEGATIVE 08/28/2018 1003   BILIRUBINUR NEGATIVE 08/28/2018 1003   KETONESUR NEGATIVE 08/28/2018 1003   PROTEINUR NEGATIVE 08/28/2018 1003   NITRITE NEGATIVE 08/28/2018 1003   LEUKOCYTESUR NEGATIVE 08/28/2018 1003    Radiological Exams on Admission: DG Chest Port 1 View  Result Date: 11/18/2023 CLINICAL DATA:  Known PEs, shortness of breath, right rib pain EXAM: PORTABLE  CHEST 1 VIEW COMPARISON:  05/16/2021 FINDINGS: Patchy right lower lobe opacity. 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 Mild left basilar  opacity, possibly atelectasis. No pleural effusion or pneumothorax. The heart is normal in size. Cervical spine fixation hardware. 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 Electronically Signed   By: Charline Bills M.D.   On: 11/18/2023 20:13    EKG: Independently reviewed.  Also reviewed CT result from OSH.  Assessment and Plan: * Acute pulmonary embolism without acute cor pulmonale (HCC) Provoked PE in setting of recent travel (car trip to [facility]) no evidence of PE on CT scan. Pt already started on eliquis, got dose earlier today and again this evening Will just continue eliquis therefore 2d echo BLE [place] for DVT  Postablative hypothyroidism Doesn't look very well controlled at the moment with TSH all over the map in recent months. Ill get a repeat TSH Reorder synthroid depending on what TSH comes back as.  Systolic murmur Pt with known AS and bicuspid aortic valve, being followed by cards.  Essential hypertension Cont home BP meds once med rec completed.      Advance Care Planning:   Code Status: Full Code  Consults: None  Family Communication: No family in room  Severity of Illness: The appropriate patient status for this patient is OBSERVATION. Observation status is judged to be reasonable and necessary in order to provide the required intensity of service to ensure the patient's safety. The patient's presenting symptoms, physical exam findings, and initial radiographic and laboratory data in the context of their medical condition is felt to place them at decreased risk for further clinical deterioration. Furthermore, it is anticipated that the patient will be medically stable for discharge  from the hospital within 2 midnights of admission.   Author: Hillary Bow., DO 11/19/2023 3:59 AM  For on call review [email or web address].

## 2023-11-19 NOTE — ED Notes (Signed)
ED TO INPATIENT HANDOFF REPORT  Name/Age/Gender Barron Alvine 62 y.o. female  Code Status    Code Status Orders  (From admission, onward)           Start     Ordered   11/19/23 0104  Full code  Continuous       Question:  By:  Answer:  Default: patient does not have capacity for decision making, no surrogate or prior directive available   11/19/23 0105           Code Status History     Date Active Date Inactive Code Status Order ID Comments User Context   05/03/2020 1205 05/04/2020 1527 Full Code [id number]  Kinsinger, De Blanch, MD Inpatient   08/28/2018 1641 08/29/2018 1446 Full Code [id number]  Estill Bamberg, MD Inpatient   05/31/2018 1541 06/02/2018 1527 Full Code [id number]  Russella Dar, NP ED       Home/SNF/Other Home  Chief Complaint Acute pulmonary embolism without acute cor pulmonale (HCC) [I26.99]  Level of Care/Admitting Diagnosis ED Disposition     ED Disposition  Admit   Condition  --   Comment  Hospital Area: [facility] [[id number]]  Level of Care: Telemetry [5]  Admit to tele based on following criteria: Complex arrhythmia (Bradycardia/Tachycardia)  May place patient in observation at [facility] or [facility] if equivalent level of care is available:: No  Covid Evaluation: Asymptomatic - no recent exposure (last 10 days) testing not required  Diagnosis: Acute pulmonary embolism without acute cor pulmonale [facility]) [[id number]]  Admitting Physician: Hillary Bow [phone]  Attending Physician: Hillary Bow [phone]          Medical History Past Medical History:  Diagnosis Date   Anxiety    Arthritis    LEFT KNEE   Asthma    Cervical myelopathy (HCC) 08/28/2018   GERD (gastroesophageal reflux disease)    Heart murmur    Hiatal hernia    Hx-TIA (transient ischemic attack) 1982   Hyperlipidemia    Hypertension    not on meds for 2 mos at preop on 02/13/23   Hypothyroidism    Obesity    Pneumonia    Polycystic  ovarian syndrome     Allergies Allergies  Allergen Reactions   Aspirin Anaphylaxis   Cherry Hives and Shortness Of Breath   Other Hives and Itching    Walnuts and pecans - cause itching and hives in mouth   Nsaids Other (See Comments)    Per patient, Drs have told her not to take nsaids because she was a bad reaction to aspirin   Soy Allergy (Do Not Select) Hives    " hives in mouth and wheezing"    Penicillins Itching    Has patient had a PCN reaction causing immediate rash, facial/tongue/throat swelling, SOB or lightheadedness with hypotension: No Has patient had a PCN reaction causing severe rash involving mucus membranes or skin necrosis: No Has patient had a PCN reaction that required hospitalization: No Has patient had a PCN reaction occurring within the last 10 years: No If all of the above answers are "NO", then may proceed with Cephalosporin use.     IV Location/Drains/Wounds Patient Lines/Drains/Airways Status     Active Line/Drains/Airways     Name Placement date Placement time Site Days   Peripheral IV 11/18/23 22 G Left;Posterior Hand 11/18/23  2057  Hand  1   Incision - 6 Ports Abdomen Right;Lateral Right;Medial Mid;Upper Mid;Medial Left;Medial Left;Lateral 05/03/20  5784  -- 1295            Labs/Imaging Results for orders placed or performed during the hospital encounter of 11/18/23 (from the past 48 hour(s))  CBC     Status: None   Collection Time: 11/18/23  7:41 PM  Result Value Ref Range   WBC 10.4 4.0 - 10.5 K/uL   RBC 4.76 3.87 - 5.11 MIL/uL   Hemoglobin 14.1 12.0 - 15.0 g/dL   HCT 69.6 29.5 - 28.4 %   MCV 91.2 80.0 - 100.0 fL   MCH 29.6 26.0 - 34.0 pg   MCHC 32.5 30.0 - 36.0 g/dL   RDW 13.2 44.0 - 10.2 %   Platelets 201 150 - 400 K/uL   nRBC 0.0 0.0 - 0.2 %    Comment: Performed at [facility], [address] [place], [place] [address]  Comprehensive metabolic panel     Status: Abnormal   Collection Time: 11/18/23  7:41  PM  Result Value Ref Range   Sodium 138 135 - 145 mmol/L   Potassium 4.0 3.5 - 5.1 mmol/L   Chloride 105 98 - 111 mmol/L   CO2 24 22 - 32 mmol/L   Glucose, Bld 87 70 - 99 mg/dL    Comment: Glucose reference range applies only to samples taken after fasting for at least 8 hours.   BUN 11 8 - 23 mg/dL   Creatinine, Ser 6.44 0.44 - 1.00 mg/dL   Calcium 9.9 8.9 - 03.4 mg/dL   Total Protein 7.6 6.5 - 8.1 g/dL   Albumin 4.0 3.5 - 5.0 g/dL   AST 14 (L) 15 - 41 U/L   ALT 12 0 - 44 U/L   Alkaline Phosphatase 78 38 - 126 U/L   Total Bilirubin 0.9 <1.2 mg/dL   GFR, Estimated >74 >25 mL/min    Comment: (NOTE) Calculated using the CKD-EPI Creatinine Equation (2021)    Anion gap 9 5 - 15    Comment: Performed at [facility], [address] [place], [place] [address]  D-dimer, quantitative     Status: Abnormal   Collection Time: 11/18/23  7:41 PM  Result Value Ref Range   D-Dimer, Quant 5.86 (H) 0.00 - 0.50 ug/mL-FEU    Comment: (NOTE) At the manufacturer cut-off value of 0.5 g/mL FEU, this assay has a negative predictive value of 95-100%.This assay is intended for use in conjunction with a clinical pretest probability (PTP) assessment model to exclude pulmonary embolism (PE) and deep venous thrombosis (DVT) in outpatients suspected of PE or DVT. Results should be correlated with clinical presentation. Performed at [facility], [address] [place], [place] [address]   Troponin I (High Sensitivity)     Status: None   Collection Time: 11/18/23  9:27 PM  Result Value Ref Range   Troponin I (High Sensitivity) 3 <18 ng/L    Comment: (NOTE) Elevated high sensitivity troponin I (hsTnI) values and significant  changes across serial measurements may suggest ACS but many other  chronic and acute conditions are known to elevate hsTnI results.  Refer to the "Links" section for chest pain algorithms and additional  guidance. Performed at [facility], [address] [place], [place] [address]   Troponin I (High Sensitivity)     Status: None   Collection Time: 11/18/23 11:45 PM  Result Value Ref Range   Troponin I (High Sensitivity) 3 <18 ng/L    Comment: (NOTE) Elevated high  sensitivity troponin I (hsTnI) values and significant  changes across serial measurements may suggest ACS but many other  chronic and acute conditions are known to elevate hsTnI results.  Refer to the "Links" section for chest pain algorithms and additional  guidance. Performed at [facility], [address], [place], [place] [address]   HIV Antibody (routine testing w rflx)     Status: None   Collection Time: 11/19/23  5:45 AM  Result Value Ref Range   HIV Screen 4th Generation wRfx Non Reactive Non Reactive    Comment: Performed at [facility], [address] [place], [place] [address]  CBC     Status: None   Collection Time: 11/19/23  5:45 AM  Result Value Ref Range   WBC 9.8 4.0 - 10.5 K/uL   RBC 4.21 3.87 - 5.11 MIL/uL   Hemoglobin 12.6 12.0 - 15.0 g/dL   HCT 82.9 56.2 - 13.0 %   MCV 91.0 80.0 - 100.0 fL   MCH 29.9 26.0 - 34.0 pg   MCHC 32.9 30.0 - 36.0 g/dL   RDW 86.5 78.4 - 69.6 %   Platelets 195 150 - 400 K/uL   nRBC 0.0 0.0 - 0.2 %    Comment: Performed at [facility], [address], [place], [place] [address]  Basic metabolic panel     Status: None   Collection Time: 11/19/23  5:45 AM  Result Value Ref Range   Sodium 137 135 - 145 mmol/L   Potassium 4.1 3.5 - 5.1 mmol/L   Chloride 104 98 - 111 mmol/L   CO2 24 22 - 32 mmol/L   Glucose, Bld 93 70 - 99 mg/dL    Comment: Glucose reference range applies only to samples taken after fasting for at least 8 hours.   BUN 15 8 - 23 mg/dL   Creatinine, Ser 4.13 0.44 - 1.00 mg/dL   Calcium 9.2 8.9 - 24.4 mg/dL   GFR, Estimated >01 >02 mL/min    Comment: (NOTE) Calculated using the CKD-EPI Creatinine Equation (2021)    Anion  gap 9 5 - 15    Comment: Performed at [facility], [address] [place], [place] [address]  TSH     Status: Abnormal   Collection Time: 11/19/23  5:45 AM  Result Value Ref Range   TSH 0.020 (L) 0.350 - 4.500 uIU/mL    Comment: Performed by a 3rd Generation assay with a functional sensitivity of <=0.01 uIU/mL. Performed at [facility], [address], [place], [place] [address]    VAS [place] LOWER EXTREMITY VENOUS (DVT)  Result Date: 11/19/2023  Lower Venous DVT Study Patient Name:  CLAIRESSA MADURA  Date of Exam:   11/19/2023 Medical Rec #: [id number]     Accession #:    [id number] Date of Birth: 1961/05/02      Patient Gender: F Patient Age:   52 years Exam Location:  [facility] Procedure:      VAS [place] LOWER EXTREMITY VENOUS (DVT) Referring Phys: Lyda Perone --------------------------------------------------------------------------------  Indications: Pulmonary embolism.  Risk Factors: Confirmed PE. Anticoagulation: Eliquis. Limitations: Poor ultrasound/tissue interface. Comparison Study: No prior studies. Performing Technologist: Chanda Busing RVT  Examination Guidelines: A complete evaluation includes B-mode imaging, spectral Doppler, color Doppler, and power Doppler as needed of all accessible portions of each vessel. Bilateral testing is considered an integral part of a complete examination. Limited examinations for reoccurring indications may be performed as noted.  The reflux portion of the exam is performed with the patient in reverse Trendelenburg.  +---------+---------------+---------+-----------+----------+--------------+ RIGHT    CompressibilityPhasicitySpontaneityPropertiesThrombus Aging +---------+---------------+---------+-----------+----------+--------------+ CFV      Full           Yes      Yes                                 +---------+---------------+---------+-----------+----------+--------------+ SFJ      Full                                                         +---------+---------------+---------+-----------+----------+--------------+ FV Prox  Full                                                        +---------+---------------+---------+-----------+----------+--------------+ FV Mid   Full                                                        +---------+---------------+---------+-----------+----------+--------------+ FV DistalFull                                                        +---------+---------------+---------+-----------+----------+--------------+ PFV      Full                                                        +---------+---------------+---------+-----------+----------+--------------+ POP      Partial        Yes      Yes                  Acute          +---------+---------------+---------+-----------+----------+--------------+ PTV      Full                                                        +---------+---------------+---------+-----------+----------+--------------+ PERO     None                                         Acute          +---------+---------------+---------+-----------+----------+--------------+ Gastroc  Full                                                        +---------+---------------+---------+-----------+----------+--------------+   +---------+---------------+---------+-----------+----------+--------------+  LEFT     CompressibilityPhasicitySpontaneityPropertiesThrombus Aging +---------+---------------+---------+-----------+----------+--------------+ CFV      Full           Yes      Yes                                 +---------+---------------+---------+-----------+----------+--------------+ SFJ      Full                                                        +---------+---------------+---------+-----------+----------+--------------+ FV Prox  Full                                                         +---------+---------------+---------+-----------+----------+--------------+ FV Mid   Full                                                        +---------+---------------+---------+-----------+----------+--------------+ FV DistalFull                                                        +---------+---------------+---------+-----------+----------+--------------+ PFV      Full                                                        +---------+---------------+---------+-----------+----------+--------------+ POP      Full           Yes      Yes                                 +---------+---------------+---------+-----------+----------+--------------+ PTV      Full                                                        +---------+---------------+---------+-----------+----------+--------------+ PERO     Full                                                        +---------+---------------+---------+-----------+----------+--------------+     Summary: RIGHT: - Findings consistent with acute deep vein thrombosis involving the right popliteal vein, and right peroneal veins.  - No cystic structure found in the popliteal fossa.  LEFT: - There is no evidence of deep vein thrombosis in the lower  extremity.  - No cystic structure found in the popliteal fossa.  *See table(s) above for measurements and observations. Electronically signed by Gerarda Fraction on 11/19/2023 at 9:59:19 AM.    Final    DG Chest Port 1 View  Result Date: 11/18/2023 CLINICAL DATA:  Known PEs, shortness of breath, right rib pain EXAM: PORTABLE CHEST 1 VIEW COMPARISON:  05/16/2021 FINDINGS: Patchy right lower lobe opacity. 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 Mild left basilar opacity, possibly atelectasis. No pleural effusion or pneumothorax. The heart is normal in size. Cervical spine fixation hardware. 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 Electronically Signed   By: Charline Bills M.D.   On: 11/18/2023 20:13    Pending Labs Unresulted Labs (From admission, onward)    None       Vitals/Pain Today's Vitals   11/19/23 0958 11/19/23 1000 11/19/23 1100 11/19/23 1200  BP:  106/64 95/70 93/60   Pulse:  80 66 84  Resp:  17 18 18   Temp: 98 F (36.7 C)     TempSrc: Oral     SpO2:  96% 97% 96%  Weight:      Height:      PainSc:        Isolation Precautions No active isolations  Medications Medications  apixaban (ELIQUIS) tablet 10 mg (10 mg Oral Given 11/19/23 0956)    Followed by  apixaban (ELIQUIS) tablet 5 mg (has no administration in time range)  acetaminophen (TYLENOL) tablet 650 mg (650 mg Oral Given 11/19/23 0552)    Or  acetaminophen (TYLENOL) suppository 650 mg ( Rectal See Alternative 11/19/23 0552)  ondansetron (ZOFRAN) tablet 4 mg (has no administration in time range)    Or  ondansetron (ZOFRAN) injection 4 mg (has no administration in time range)  morphine (PF) 2 MG/ML injection 2-4 mg (2 mg Intravenous Given 11/19/23 0225)  oxyCODONE (Oxy IR/ROXICODONE) immediate release tablet 5-10 mg (has no administration in time range)  0.9 %  sodium chloride infusion ( Intravenous New Bag/Given 11/19/23 1002)  escitalopram (LEXAPRO) tablet 10 mg (10 mg Oral Given 11/19/23 0957)  famotidine (PEPCID) tablet 20 mg (has no administration in time range)  levothyroxine (SYNTHROID) tablet 125 mcg (125 mcg Oral Given 11/19/23 0957)  rOPINIRole (REQUIP) tablet 3 mg (has no administration in time range)  rosuvastatin (CRESTOR) tablet 40 mg (has no administration in time range)  albuterol (PROVENTIL) (2.5 MG/3ML) 0.083% nebulizer solution 2.5 mg (has no administration in time range)  morphine (PF) 4 MG/ML injection 4 mg (4 mg Intravenous Given 11/18/23 2100)  ondansetron (ZOFRAN) injection 4 mg (4 mg Intravenous Given 11/18/23 2100)   apixaban (ELIQUIS) tablet 10 mg (10 mg Oral Given 11/18/23 2140)  morphine (PF) 4 MG/ML injection 4 mg (4 mg Intravenous Given 11/18/23 2212)    Mobility walks

## 2023-11-19 NOTE — Discharge Instructions (Signed)
Information on my medicine - ELIQUIS (apixaban)  This medication education was reviewed with me or my healthcare representative as part of my discharge preparation.    Why was Eliquis prescribed for you? Eliquis was prescribed to treat blood clots that may have been found in the veins of your legs (deep vein thrombosis) or in your lungs (pulmonary embolism) and to reduce the risk of them occurring again.  What do You need to know about Eliquis ? The starting dose is 10 mg (two 5 mg tablets) taken TWICE daily for the FIRST SEVEN (7) DAYS, then on 11/25/23  the dose is reduced to ONE 5 mg tablet taken TWICE daily.  Eliquis may be taken with or without food.   Try to take the dose about the same time in the morning and in the evening. If you have difficulty swallowing the tablet whole please discuss with your pharmacist how to take the medication safely.  Take Eliquis exactly as prescribed and DO NOT stop taking Eliquis without talking to the doctor who prescribed the medication.  Stopping may increase your risk of developing a new blood clot.  Refill your prescription before you run out.  After discharge, you should have regular check-up appointments with your healthcare provider that is prescribing your Eliquis.    What do you do if you miss a dose? If a dose of ELIQUIS is not taken at the scheduled time, take it as soon as possible on the same day and twice-daily administration should be resumed. The dose should not be doubled to make up for a missed dose.  Important Safety Information A possible side effect of Eliquis is bleeding. You should call your healthcare provider right away if you experience any of the following: Bleeding from an injury or your nose that does not stop. Unusual colored urine (red or dark brown) or unusual colored stools (red or black). Unusual bruising for unknown reasons. A serious fall or if you hit your head (even if there is no bleeding).  Some  medicines may interact with Eliquis and might increase your risk of bleeding or clotting while on Eliquis. To help avoid this, consult your healthcare provider or pharmacist prior to using any new prescription or non-prescription medications, including herbals, vitamins, non-steroidal anti-inflammatory drugs (NSAIDs) and supplements.  This website has more information on Eliquis (apixaban): http://www.eliquis.com/eliquis/home

## 2023-11-19 NOTE — Assessment & Plan Note (Signed)
Provoked PE in setting of recent travel (car trip to [facility]) no evidence of PE on CT scan. Pt already started on eliquis, got dose earlier today and again this evening Will just continue eliquis therefore 2d echo BLE [place] for DVT

## 2023-11-19 NOTE — Assessment & Plan Note (Signed)
Pt with known AS and bicuspid aortic valve, being followed by cards.

## 2023-11-20 ENCOUNTER — Observation Stay (HOSPITAL_COMMUNITY): Payer: No Typology Code available for payment source

## 2023-11-20 DIAGNOSIS — I10 Essential (primary) hypertension: Secondary | ICD-10-CM | POA: Diagnosis not present

## 2023-11-20 DIAGNOSIS — E890 Postprocedural hypothyroidism: Secondary | ICD-10-CM | POA: Diagnosis not present

## 2023-11-20 DIAGNOSIS — I82401 Acute embolism and thrombosis of unspecified deep veins of right lower extremity: Secondary | ICD-10-CM

## 2023-11-20 DIAGNOSIS — I2699 Other pulmonary embolism without acute cor pulmonale: Secondary | ICD-10-CM | POA: Diagnosis not present

## 2023-11-20 DIAGNOSIS — N63 Unspecified lump in unspecified breast: Secondary | ICD-10-CM | POA: Diagnosis not present

## 2023-11-20 LAB — COMPREHENSIVE METABOLIC PANEL
ALT: 8 U/L (ref 0–44)
AST: 11 U/L — ABNORMAL LOW (ref 15–41)
Albumin: 2.7 g/dL — ABNORMAL LOW (ref 3.5–5.0)
Alkaline Phosphatase: 61 U/L (ref 38–126)
Anion gap: 7 (ref 5–15)
BUN: 13 mg/dL (ref 8–23)
CO2: 22 mmol/L (ref 22–32)
Calcium: 9 mg/dL (ref 8.9–10.3)
Chloride: 106 mmol/L (ref 98–111)
Creatinine, Ser: 0.55 mg/dL (ref 0.44–1.00)
GFR, Estimated: >60 mL/min (ref >60)
Glucose, Bld: 143 mg/dL — ABNORMAL HIGH (ref 70–99)
Potassium: 3.5 mmol/L (ref 3.5–5.1)
Sodium: 135 mmol/L (ref 135–145)
Total Bilirubin: 0.5 mg/dL (ref <1.2)
Total Protein: 5.8 g/dL — ABNORMAL LOW (ref 6.5–8.1)

## 2023-11-20 LAB — CBC WITH DIFFERENTIAL/PLATELET
Abs Immature Granulocytes: 0.02 10*3/uL (ref 0.00–0.07)
Basophils Absolute: 0 10*3/uL (ref 0.0–0.1)
Basophils Relative: 0 %
Eosinophils Absolute: 0.2 10*3/uL (ref 0.0–0.5)
Eosinophils Relative: 3 %
HCT: 36 % (ref 36.0–46.0)
Hemoglobin: 12 g/dL (ref 12.0–15.0)
Immature Granulocytes: 0 %
Lymphocytes Relative: 26 %
Lymphs Abs: 1.8 10*3/uL (ref 0.7–4.0)
MCH: 30.9 pg (ref 26.0–34.0)
MCHC: 33.3 g/dL (ref 30.0–36.0)
MCV: 92.8 fL (ref 80.0–100.0)
Monocytes Absolute: 0.4 10*3/uL (ref 0.1–1.0)
Monocytes Relative: 6 %
Neutro Abs: 4.5 10*3/uL (ref 1.7–7.7)
Neutrophils Relative %: 65 %
Platelets: 193 10*3/uL (ref 150–400)
RBC: 3.88 MIL/uL (ref 3.87–5.11)
RDW: 12.8 % (ref 11.5–15.5)
WBC: 6.9 10*3/uL (ref 4.0–10.5)
nRBC: 0 % (ref 0.0–0.2)

## 2023-11-20 LAB — PHOSPHORUS: Phosphorus: 2.6 mg/dL (ref 2.5–4.6)

## 2023-11-20 LAB — MAGNESIUM: Magnesium: 2 mg/dL (ref 1.7–2.4)

## 2023-11-20 MED ORDER — PANTOPRAZOLE SODIUM 40 MG PO TBEC
40.0000 mg | DELAYED_RELEASE_TABLET | Freq: Every day | ORAL | Status: DC
  Administered 2023-11-20 – 2023-11-21 (×2): 40 mg via ORAL
  Filled 2023-11-20 (×2): qty 1

## 2023-11-20 MED ORDER — LEVOTHYROXINE SODIUM 100 MCG PO TABS
100.0000 ug | ORAL_TABLET | Freq: Every day | ORAL | Status: DC
  Administered 2023-11-21: 100 ug via ORAL
  Filled 2023-11-20: qty 1

## 2023-11-20 NOTE — Plan of Care (Signed)

## 2023-11-20 NOTE — Progress Notes (Signed)
PROGRESS NOTE    Alison [name]  WGN:[id number] DOB: 1961-06-18 DOA: 11/18/2023 PCP: Willow Ora, MD   Brief Narrative:  The patient is a 62 year old African-American female with a past medical history significant for but not limited to hypertension, hyperlipidemia, hypothyroidism, bicuspid aortic valve and many other surgeries presented to the ED on 11/18/2023 with shortness of breath and found to have an acute PE.  She had driven to [facility] developed leg cramping abrupt onset of right sided pleuritic chest pain and went to the ED with segmental PEs were confirmed the right by CT scan without RV strain.  At that time admission was recommended but she opted to drive back to to [facility] to be admitted given that she needed a take care of her elderly mother with dementia.  At that time she was not hypoxic and not in respiratory distress.  She had pain after having IV morphine so she was admitted yesterday morning by Dr. Julian Reil and echocardiogram and lower extremity venous duplex were ordered.  She is found to have an acute right leg DVT and normal EF on echo findings.  She is now on anticoagulation but spiked a temperature overnight so we will monitor to see if she has any infection and anticipate discharging her she is afebrile in the next 24 hours  Assessment and Plan:  Acute Provoked Right Segmental PE with Associated Right Leg DVT -Continue Anticoagulation with Apixaban -LE Venous Duplex done and showed "Findings consistent with acute deep vein thrombosis involving the right popliteal vein, and right peroneal veins." -Evidence of pulmonary infarct noted, the cause of her pain.  -ECHOCardiogram done and showed EF of 60-65% and Grade 1 DD with Normal R Ventricular Systolic Function -Give oxycodone and if needed morphine for refractory pain -Dr. Jarvis Newcomer discussed with her that this is to facilitate her mobility/incentive spirometry use.   Fever -Had a Temp of 101 over night and  likely in the setting of PE/DVT but want to rule out infection -Check Blood Cx x2, Urinalysis and repeat CXR in the AM  -U/A negative -CXR this AM done and showed "Worsening right basilar opacity with possible new small pleural effusion. This could reflect worsening pulmonary infarct, atelectasis, or pneumonia." -Blood Cx x2 Pending  -Currently has no Leukocytosis as WBC Trend: Recent Labs  Lab 11/18/23 1941 11/19/23 0545 11/20/23 0932  WBC 10.4 9.8 6.9  -Continue to Monitor and Trend and Repeat CBC and Monitor Fever Curve   Hypotension but has a history of Hypertension -Without tachycardia, overt bleeding, source for sepsis, etc. suspect morphine is contributing.  -Hold antihypertensives at this time occluding hydrochlorothiazide -She was given 1L NS over 10 hours and monitor closely.  -Continue Monitor BP per Protocol -Last BP reading was 123/66    Right Breast Nodular Density -Seen on CT.  -Pt well aware and will pursue mammogram thru her PCP after hospitalization.  -It is odd that she's had many surgeries and no previous DVTs but had one after a 6 hour nonstop trip to [place]   Hypothyroidism -TSH was 0.020 and so we have decreased her levothyroxine dose to 100 mcg p.o. daily  Hyperlipidemia -Continue Rosuvastatin 40 mg p.o. nightly  Depression and Anxiety Will continue with escitalopram 10 mL p.o. daily -If necessary will resume her alprazolam 0.25 mg p.o. nightly  GERD/GI Prophylaxis -Continue with pantoprazole 40 mg p.o. twice daily and famotidine 20 mg p.o. nightly  Hyperglycemia -Glucose Level Trend: Recent Labs  Lab 11/18/23 1941 11/19/23  1027 11/20/23 0932  GLUCOSE 87 93 143*  -Check HbA1c in the AM   Hypoalbuminemia -Patient's Albumin Trend: Recent Labs  Lab 11/18/23 1941 11/20/23 0932  ALBUMIN 4.0 2.7*  -Continue to Monitor and Trend and repeat CMP in the AM  DVT prophylaxis:  apixaban (ELIQUIS) tablet 10 mg  apixaban (ELIQUIS) tablet 5 mg     Code Status: Full Code Family Communication: No family currently at bedside  Disposition Plan:  Level of care: Telemetry Status is: Observation The patient will require care spanning > 2 midnights and should be moved to inpatient because: Needs evaluate her for fever given that she was febrile over 101 overnight   Consultants:  None  Procedures:  ECHOCARDIOGRAM  IMPRESSIONS     1. Left ventricular ejection fraction, by estimation, is 60 to 65%. The  left ventricle has normal function. The left ventricle has no regional  wall motion abnormalities. Left ventricular diastolic parameters are  consistent with Grade I diastolic  dysfunction (impaired relaxation).   2. Right ventricular systolic function is normal. The right ventricular  size is normal. There is normal pulmonary artery systolic pressure. The  estimated right ventricular systolic pressure is 29.4 mmHg.   3. The mitral valve is normal in structure. No evidence of mitral valve  regurgitation. No evidence of mitral stenosis.   4. The aortic valve is tricuspid. There is moderate calcification of the  aortic valve. There is moderate thickening of the aortic valve. Aortic  valve regurgitation is not visualized. Mild to moderate aortic valve  stenosis. Aortic valve mean gradient  measures 14.0 mmHg. Aortic valve Vmax measures 2.40 m/s.   Comparison(s): No significant change from prior study. Prior images  reviewed side by side.   FINDINGS   Left Ventricle: Left ventricular ejection fraction, by estimation, is 60  to 65%. The left ventricle has normal function. The left ventricle has no  regional wall motion abnormalities. The left ventricular internal cavity  size was normal in size. There is   no left ventricular hypertrophy. Left ventricular diastolic parameters  are consistent with Grade I diastolic dysfunction (impaired relaxation).  Indeterminate filling pressures.   Right Ventricle: The right ventricular size is  normal. No increase in  right ventricular wall thickness. Right ventricular systolic function is  normal. There is normal pulmonary artery systolic pressure. The tricuspid  regurgitant velocity is 2.57 m/s, and   with an assumed right atrial pressure of 3 mmHg, the estimated right  ventricular systolic pressure is 29.4 mmHg.   Left Atrium: Left atrial size was normal in size.   Right Atrium: Right atrial size was normal in size.   Pericardium: There is no evidence of pericardial effusion.   Mitral Valve: The mitral valve is normal in structure. Mild to moderate  mitral annular calcification. No evidence of mitral valve regurgitation.  No evidence of mitral valve stenosis.   Tricuspid Valve: The tricuspid valve is normal in structure. Tricuspid  valve regurgitation is mild.   Aortic Valve: The aortic valve is tricuspid. There is moderate  calcification of the aortic valve. There is moderate thickening of the  aortic valve. Aortic valve regurgitation is not visualized. Mild to  moderate aortic stenosis is present. Aortic valve  mean gradient measures 14.0 mmHg. Aortic valve peak gradient measures 22.9  mmHg. Aortic valve area, by VTI measures 1.38 cm.   Pulmonic Valve: The pulmonic valve was not well visualized. Pulmonic valve  regurgitation is not visualized. No evidence of pulmonic stenosis.  Aorta: The aortic root and ascending aorta are structurally normal, with  no evidence of dilitation.   IAS/Shunts: No atrial level shunt detected by color flow Doppler.     LEFT VENTRICLE  PLAX 2D  LVIDd:         3.90 cm   Diastology  LVIDs:         2.50 cm   LV e' medial:    5.55 cm/s  LV PW:         0.80 cm   LV E/e' medial:  17.0  LV IVS:        0.80 cm   LV e' lateral:   9.46 cm/s  LVOT diam:     1.90 cm   LV E/e' lateral: 10.0  LV SV:         63  LV SV Index:   35  LVOT Area:     2.84 cm     RIGHT VENTRICLE             IVC  RV S prime:     19.70 cm/s  IVC diam: 1.60 cm   TAPSE (M-mode): 2.3 cm   LEFT ATRIUM             Index        RIGHT ATRIUM           Index  LA diam:        3.70 cm 2.06 cm/m   RA Area:     16.00 cm  LA Vol (A2C):   29.7 ml 16.52 ml/m  RA Volume:   40.30 ml  22.42 ml/m  LA Vol (A4C):   37.7 ml 20.97 ml/m  LA Biplane Vol: 33.7 ml 18.75 ml/m   AORTIC VALVE  AV Area (Vmax):    1.55 cm  AV Area (Vmean):   1.38 cm  AV Area (VTI):     1.38 cm  AV Vmax:           239.50 cm/s  AV Vmean:          179.500 cm/s  AV VTI:            0.456 m  AV Peak Grad:      22.9 mmHg  AV Mean Grad:      14.0 mmHg  LVOT Vmax:         131.00 cm/s  LVOT Vmean:        87.500 cm/s  LVOT VTI:          0.222 m  LVOT/AV VTI ratio: 0.49    AORTA  Ao Root diam: 2.80 cm  Ao Asc diam:  3.30 cm   MITRAL VALVE                TRICUSPID VALVE  MV Area (PHT): 3.99 cm     TR Peak grad:   26.4 mmHg  MV Decel Time: 190 msec     TR Vmax:        257.00 cm/s  MV E velocity: 94.50 cm/s  MV A velocity: 111.00 cm/s  SHUNTS  MV E/A ratio:  0.85         Systemic VTI:  0.22 m                              Systemic Diam: 1.90 cm    LE VENOUS DUPLEX  Summary:  RIGHT:  - Findings consistent with acute deep vein thrombosis  involving the right  popliteal vein, and right peroneal veins.    - No cystic structure found in the popliteal fossa.    LEFT:  - There is no evidence of deep vein thrombosis in the lower extremity.    - No cystic structure found in the popliteal fossa.    *See table(s) above for measurements and observations.    Antimicrobials:  Anti-infectives (From admission, onward)    None       Subjective: Seen and examined at bedside still having some chest discomfort in the right side.  Denies any nausea or vomiting.  Feels okay.  Still has some dyspnea.  No other concerns or complaints at this time but was febrile overnight.  No other concerns or complaint at this time.  Objective: Vitals:   11/19/23 2018 11/20/23 0003 11/20/23 0509  11/20/23 1241  BP: (!) 95/51 112/62 122/67 123/66  Pulse: 69 82 65 64  Resp: 16 16 16 16   Temp: 98.8 F (37.1 C) 98.9 F (37.2 C) 98.3 F (36.8 C) 98.2 F (36.8 C)  TempSrc:      SpO2: 97% 95% 97% 98%  Weight:      Height:        Intake/Output Summary (Last 24 hours) at 11/20/2023 1701 Last data filed at 11/19/2023 1717 Gross per 24 hour  Intake 725 ml  Output --  Net 725 ml   Filed Weights   11/18/23 1832  Weight: 78.5 kg   Examination: Physical Exam:  Constitutional: WN/WD obese African-American female in no acute distress Respiratory: Diminished to auscultation bilaterally, no wheezing, rales, rhonchi or crackles. Normal respiratory effort and patient is not tachypenic. No accessory muscle use.  Unlabored breathing Cardiovascular: RRR, no murmurs / rubs / gallops. S1 and S2 auscultated.  Has 1+ lower extremity edema bilaterally in the lower extremities Abdomen: Soft, non-tender, distended secondary to body habitus. Bowel sounds positive.  GU: Deferred. Musculoskeletal: No clubbing / cyanosis of digits/nails. No joint deformity upper and lower extremities.  Skin: No rashes, lesions, ulcers. No induration; Warm and dry.  Neurologic: CN 2-12 grossly intact with no focal deficits. Romberg sign and cerebellar reflexes not assessed.  Psychiatric: Normal judgment and insight. Alert and oriented x 3. Normal mood and appropriate affect.   Data Reviewed: I have personally reviewed following labs and imaging studies  CBC: Recent Labs  Lab 11/18/23 1941 11/19/23 0545 11/20/23 0932  WBC 10.4 9.8 6.9  NEUTROABS  --   --  4.5  HGB 14.1 12.6 12.0  HCT 43.4 38.3 36.0  MCV 91.2 91.0 92.8  PLT 201 195 193   Basic Metabolic Panel: Recent Labs  Lab 11/18/23 1941 11/19/23 0545 11/20/23 0932  NA 138 137 135  K 4.0 4.1 3.5  CL 105 104 106  CO2 24 24 22   GLUCOSE 87 93 143*  BUN 11 15 13   CREATININE 0.47 0.70 0.55  CALCIUM 9.9 9.2 9.0  MG  --   --  2.0  PHOS  --   --   2.6   GFR: Estimated Creatinine Clearance: 71.7 mL/min (by C-G formula based on SCr of 0.55 mg/dL). Liver Function Tests: Recent Labs  Lab 11/18/23 1941 11/20/23 0932  AST 14* 11*  ALT 12 8  ALKPHOS 78 61  BILITOT 0.9 0.5  PROT 7.6 5.8*  ALBUMIN 4.0 2.7*   No results for input(s): "LIPASE", "AMYLASE" in the last 168 hours. No results for input(s): "AMMONIA" in the last 168 hours. Coagulation Profile: No results for  input(s): "INR", "PROTIME" in the last 168 hours. Cardiac Enzymes: No results for input(s): "CKTOTAL", "CKMB", "CKMBINDEX", "TROPONINI" in the last 168 hours. BNP (last 3 results) No results for input(s): "PROBNP" in the last 8760 hours. HbA1C: No results for input(s): "HGBA1C" in the last 72 hours. CBG: No results for input(s): "GLUCAP" in the last 168 hours. Lipid Profile: No results for input(s): "CHOL", "HDL", "LDLCALC", "TRIG", "CHOLHDL", "LDLDIRECT" in the last 72 hours. Thyroid Function Tests: Recent Labs    11/19/23 0545  TSH 0.020*   Anemia Panel: No results for input(s): "VITAMINB12", "FOLATE", "FERRITIN", "TIBC", "IRON", "RETICCTPCT" in the last 72 hours. Sepsis Labs: No results for input(s): "PROCALCITON", "LATICACIDVEN" in the last 168 hours.  Recent Results (from the past 240 hour(s))  Culture, blood (Routine X 2) w Reflex to ID Panel     Status: None (Preliminary result)   Collection Time: 11/20/23  9:22 AM   Specimen: BLOOD RIGHT ARM  Result Value Ref Range Status   Specimen Description   Final    BLOOD RIGHT ARM Performed at [facility], [address], [place], [place] [address]    Special Requests   Final    BOTTLES DRAWN AEROBIC AND ANAEROBIC Blood Culture results may not be optimal due to an inadequate volume of blood received in culture bottles Performed at [facility], [address], [place], [place] [address]    Culture PENDING  Incomplete   Report Status PENDING  Incomplete  Culture, blood (Routine  X 2) w Reflex to ID Panel     Status: None (Preliminary result)   Collection Time: 11/20/23  9:32 AM   Specimen: BLOOD LEFT HAND  Result Value Ref Range Status   Specimen Description   Final    BLOOD LEFT HAND Performed at [facility], [address], [place], [place] [address]    Special Requests   Final    BOTTLES DRAWN AEROBIC ONLY Blood Culture results may not be optimal due to an inadequate volume of blood received in culture bottles Performed at [facility], [address] [place], [place] [address]    Culture PENDING  Incomplete   Report Status PENDING  Incomplete    Radiology Studies: DG CHEST PORT 1 VIEW  Result Date: 11/20/2023 CLINICAL DATA:  Fever.  Known pulmonary embolism. EXAM: PORTABLE CHEST 1 VIEW COMPARISON:  Chest x-ray dated November 18, 2023. FINDINGS: Normal heart size. Low lung volumes. Worsening right basilar opacity with possible new small pleural effusion. Slightly worsened mild left basilar atelectasis. No pneumothorax. No acute osseous abnormality. IMPRESSION: 1. Worsening right basilar opacity with possible new small pleural effusion. This could reflect worsening pulmonary infarct, atelectasis, or pneumonia. Electronically Signed   By: Obie Dredge M.D.   On: 11/20/2023 13:04   ECHOCARDIOGRAM COMPLETE  Result Date: 11/19/2023    ECHOCARDIOGRAM REPORT   Patient Name:   Alison [name] Date of Exam: 11/19/2023 Medical Rec #:  [id number]    Height:       62.0 in Accession #:    [id number]   Weight:       173.0 lb Date of Birth:  Jul 30, 1961     BSA:          1.797 m Patient Age:    62 years     BP:           103/58 mmHg Patient Gender: F            HR:  79 bpm. Exam Location:  Inpatient Procedure: 2D Echo, Color Doppler and Cardiac Doppler Indications:    Pulmonary Embolus I26.09  History:        Patient has prior history of Echocardiogram examinations, most                 recent 08/06/2023.  Sonographer:    Harriette Bouillon RDCS  Referring Phys: [phone] JARED M GARDNER IMPRESSIONS  1. Left ventricular ejection fraction, by estimation, is 60 to 65%. The left ventricle has normal function. The left ventricle has no regional wall motion abnormalities. Left ventricular diastolic parameters are consistent with Grade I diastolic dysfunction (impaired relaxation).  2. Right ventricular systolic function is normal. The right ventricular size is normal. There is normal pulmonary artery systolic pressure. The estimated right ventricular systolic pressure is 29.4 mmHg.  3. The mitral valve is normal in structure. No evidence of mitral valve regurgitation. No evidence of mitral stenosis.  4. The aortic valve is tricuspid. There is moderate calcification of the aortic valve. There is moderate thickening of the aortic valve. Aortic valve regurgitation is not visualized. Mild to moderate aortic valve stenosis. Aortic valve mean gradient measures 14.0 mmHg. Aortic valve Vmax measures 2.40 m/s. Comparison(s): No significant change from prior study. Prior images reviewed side by side. FINDINGS  Left Ventricle: Left ventricular ejection fraction, by estimation, is 60 to 65%. The left ventricle has normal function. The left ventricle has no regional wall motion abnormalities. The left ventricular internal cavity size was normal in size. There is  no left ventricular hypertrophy. Left ventricular diastolic parameters are consistent with Grade I diastolic dysfunction (impaired relaxation). Indeterminate filling pressures. Right Ventricle: The right ventricular size is normal. No increase in right ventricular wall thickness. Right ventricular systolic function is normal. There is normal pulmonary artery systolic pressure. The tricuspid regurgitant velocity is 2.57 m/s, and  with an assumed right atrial pressure of 3 mmHg, the estimated right ventricular systolic pressure is 29.4 mmHg. Left Atrium: Left atrial size was normal in size. Right Atrium: Right atrial size  was normal in size. Pericardium: There is no evidence of pericardial effusion. Mitral Valve: The mitral valve is normal in structure. Mild to moderate mitral annular calcification. No evidence of mitral valve regurgitation. No evidence of mitral valve stenosis. Tricuspid Valve: The tricuspid valve is normal in structure. Tricuspid valve regurgitation is mild. Aortic Valve: The aortic valve is tricuspid. There is moderate calcification of the aortic valve. There is moderate thickening of the aortic valve. Aortic valve regurgitation is not visualized. Mild to moderate aortic stenosis is present. Aortic valve mean gradient measures 14.0 mmHg. Aortic valve peak gradient measures 22.9 mmHg. Aortic valve area, by VTI measures 1.38 cm. Pulmonic Valve: The pulmonic valve was not well visualized. Pulmonic valve regurgitation is not visualized. No evidence of pulmonic stenosis. Aorta: The aortic root and ascending aorta are structurally normal, with no evidence of dilitation. IAS/Shunts: No atrial level shunt detected by color flow Doppler.  LEFT VENTRICLE PLAX 2D LVIDd:         3.90 cm   Diastology LVIDs:         2.50 cm   LV e' medial:    5.55 cm/s LV PW:         0.80 cm   LV E/e' medial:  17.0 LV IVS:        0.80 cm   LV e' lateral:   9.46 cm/s LVOT diam:     1.90 cm  LV E/e' lateral: 10.0 LV SV:         63 LV SV Index:   35 LVOT Area:     2.84 cm  RIGHT VENTRICLE             IVC RV S prime:     19.70 cm/s  IVC diam: 1.60 cm TAPSE (M-mode): 2.3 cm LEFT ATRIUM             Index        RIGHT ATRIUM           Index LA diam:        3.70 cm 2.06 cm/m   RA Area:     16.00 cm LA Vol (A2C):   29.7 ml 16.52 ml/m  RA Volume:   40.30 ml  22.42 ml/m LA Vol (A4C):   37.7 ml 20.97 ml/m LA Biplane Vol: 33.7 ml 18.75 ml/m  AORTIC VALVE AV Area (Vmax):    1.55 cm AV Area (Vmean):   1.38 cm AV Area (VTI):     1.38 cm AV Vmax:           239.50 cm/s AV Vmean:          179.500 cm/s AV VTI:            0.456 m AV Peak Grad:      22.9  mmHg AV Mean Grad:      14.0 mmHg LVOT Vmax:         131.00 cm/s LVOT Vmean:        87.500 cm/s LVOT VTI:          0.222 m LVOT/AV VTI ratio: 0.49  AORTA Ao Root diam: 2.80 cm Ao Asc diam:  3.30 cm MITRAL VALVE                TRICUSPID VALVE MV Area (PHT): 3.99 cm     TR Peak grad:   26.4 mmHg MV Decel Time: 190 msec     TR Vmax:        257.00 cm/s MV E velocity: 94.50 cm/s MV A velocity: 111.00 cm/s  SHUNTS MV E/A ratio:  0.85         Systemic VTI:  0.22 m                             Systemic Diam: 1.90 cm Rachelle Hora Croitoru MD Electronically signed by Thurmon Fair MD Signature Date/Time: 11/19/2023/4:31:53 PM    Final    VAS [place] LOWER EXTREMITY VENOUS (DVT)  Result Date: 11/19/2023  Lower Venous DVT Study Patient Name:  Alison [name]  Date of Exam:   11/19/2023 Medical Rec #: [id number]     Accession #:    [id number] Date of Birth: [birth year]      Patient Gender: F Patient Age:   28 years Exam [facility]  [facility] Procedure:      VAS [place] LOWER EXTREMITY VENOUS (DVT) Referring Phys: Lyda Perone --------------------------------------------------------------------------------  Indications: Pulmonary embolism.  Risk Factors: Confirmed PE. Anticoagulation: Eliquis. Limitations: Poor ultrasound/tissue interface. Comparison Study: No prior studies. Performing Technologist: Chanda Busing RVT  Examination Guidelines: A complete evaluation includes B-mode imaging, spectral Doppler, color Doppler, and power Doppler as needed of all accessible portions of each vessel. Bilateral testing is considered an integral part of a complete examination. Limited examinations for reoccurring indications may be performed as noted. The reflux portion of the exam is performed with the  patient in reverse Trendelenburg.  +---------+---------------+---------+-----------+----------+--------------+ RIGHT    CompressibilityPhasicitySpontaneityPropertiesThrombus Aging  +---------+---------------+---------+-----------+----------+--------------+ CFV      Full           Yes      Yes                                 +---------+---------------+---------+-----------+----------+--------------+ SFJ      Full                                                        +---------+---------------+---------+-----------+----------+--------------+ FV Prox  Full                                                        +---------+---------------+---------+-----------+----------+--------------+ FV Mid   Full                                                        +---------+---------------+---------+-----------+----------+--------------+ FV DistalFull                                                        +---------+---------------+---------+-----------+----------+--------------+ PFV      Full                                                        +---------+---------------+---------+-----------+----------+--------------+ POP      Partial        Yes      Yes                  Acute          +---------+---------------+---------+-----------+----------+--------------+ PTV      Full                                                        +---------+---------------+---------+-----------+----------+--------------+ PERO     None                                         Acute          +---------+---------------+---------+-----------+----------+--------------+ Gastroc  Full                                                        +---------+---------------+---------+-----------+----------+--------------+   +---------+---------------+---------+-----------+----------+--------------+  LEFT     CompressibilityPhasicitySpontaneityPropertiesThrombus Aging +---------+---------------+---------+-----------+----------+--------------+ CFV      Full           Yes      Yes                                  +---------+---------------+---------+-----------+----------+--------------+ SFJ      Full                                                        +---------+---------------+---------+-----------+----------+--------------+ FV Prox  Full                                                        +---------+---------------+---------+-----------+----------+--------------+ FV Mid   Full                                                        +---------+---------------+---------+-----------+----------+--------------+ FV DistalFull                                                        +---------+---------------+---------+-----------+----------+--------------+ PFV      Full                                                        +---------+---------------+---------+-----------+----------+--------------+ POP      Full           Yes      Yes                                 +---------+---------------+---------+-----------+----------+--------------+ PTV      Full                                                        +---------+---------------+---------+-----------+----------+--------------+ PERO     Full                                                        +---------+---------------+---------+-----------+----------+--------------+     Summary: RIGHT: - Findings consistent with acute deep vein thrombosis involving the right popliteal vein, and right peroneal veins.  - No cystic structure found in the popliteal fossa.  LEFT: - There is no evidence of deep vein thrombosis in the lower  extremity.  - No cystic structure found in the popliteal fossa.  *See table(s) above for measurements and observations. Electronically signed by Gerarda Fraction on 11/19/2023 at 9:59:19 AM.    Final    DG Chest Port 1 View  Result Date: 11/18/2023 CLINICAL DATA:  Known PEs, shortness of breath, right rib pain EXAM: PORTABLE CHEST 1 VIEW COMPARISON:  05/16/2021 FINDINGS: Patchy right lower lobe  opacity. 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 Mild left basilar opacity, possibly atelectasis. No pleural effusion or pneumothorax. The heart is normal in size. Cervical spine fixation hardware. 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 Electronically Signed   By: Charline Bills M.D.   On: 11/18/2023 20:13    Scheduled Meds:  apixaban  10 mg Oral BID   Followed by   Melene Muller ON 11/25/2023] apixaban  5 mg Oral BID   escitalopram  10 mg Oral Daily   famotidine  20 mg Oral QHS   [START ON 11/21/2023] levothyroxine  100 mcg Oral Q0600   pantoprazole  40 mg Oral Daily   rOPINIRole  3 mg Oral QHS   rosuvastatin  40 mg Oral QHS   Continuous Infusions:   LOS: 0 days   Marguerita Merles, DO [facility] Available via Epic secure chat 7am-7pm After these hours, please refer to coverage provider listed on [email or web address] 11/20/2023, 5:01 PM

## 2023-11-20 NOTE — Hospital Course (Addendum)
The patient is a 62 year old African-American female with a past medical history significant for but not limited to hypertension, hyperlipidemia, hypothyroidism, bicuspid aortic valve and many other surgeries presented to the ED on 11/18/2023 with shortness of breath and found to have an acute PE.  She had driven to [facility] developed leg cramping abrupt onset of right sided pleuritic chest pain and went to the ED with segmental PEs were confirmed the right by CT scan without RV strain.  At that time admission was recommended but she opted to drive back to to [facility] to be admitted given that she needed a take care of her elderly mother with dementia.  At that time she was not hypoxic and not in respiratory distress.  She had pain after having IV morphine so she was admitted yesterday morning by Dr. Julian Reil and echocardiogram and lower extremity venous duplex were ordered.  She is found to have an acute right leg DVT and normal EF on echo findings.  She is now on anticoagulation but spiked a temperature overnight so we will monitor to see if she has any infection and anticipate discharging her she is afebrile in the next 24 hours.  Her fever resolved and she was not febrile overnight.  She did well and did not desaturate on home O2 screen.  She is medically stable for discharge at this time with follow-up with PCP within 1 week and follow-up with hematology in outpatient setting for hypercoagulable workup.   Assessment and Plan:  Acute Provoked Right Segmental PE with Associated Right Leg DVT -Continue Anticoagulation with Apixaban -LE Venous Duplex done and showed "Findings consistent with acute deep vein thrombosis involving the right popliteal vein, and right peroneal veins." -Evidence of pulmonary infarct noted, the cause of her pain.  -ECHOCardiogram done and showed EF of 60-65% and Grade 1 DD with Normal R Ventricular Systolic Function -Give oxycodone and if needed morphine for  refractory pain -Dr. Jarvis Newcomer discussed with her that this is to facilitate her mobility/incentive spirometry use.  -She is medically stable and she is improved for discharge and will need to follow-up with PCP and hematology outpatient setting  Fever, improved -Had a Temp of 101 over night and likely in the setting of PE/DVT but want to rule out infection -Check Blood Cx x2, Urinalysis and repeat CXR in the AM  -U/A negative -CXR this AM done and showed "Worsening right basilar opacity with possible new small pleural effusion. This could reflect worsening pulmonary infarct, atelectasis, or pneumonia." -Blood Cx x2  showed NGTD at 3 Days  -Currently has no Leukocytosis as WBC Trend: Recent Labs  Lab 11/18/23 1941 11/19/23 0545 11/20/23 0932 11/21/23 0526  WBC 10.4 9.8 6.9 6.9  -Continue to Monitor and Trend and Repeat CBC and Monitor Fever Curve   Hypotension but has a history of Hypertension, improved  -Without tachycardia, overt bleeding, source for sepsis, etc. suspect morphine is contributing.  -Hold antihypertensives at this time occluding hydrochlorothiazide -She was given 1L NS over 10 hours and monitor closely.  -Continue Monitor BP per Protocol -Last BP reading was 123/66    Right Breast Nodular Density -Seen on CT.  -Pt well aware and will pursue mammogram through her PCP after hospitalization.  -It is odd that she's had many surgeries and no previous DVTs but had one after a 6 hour nonstop trip to [place]   Hypothyroidism -TSH was 0.020 and so we have decreased her levothyroxine dose to 100 mcg p.o. daily  Hyperlipidemia -Continue Rosuvastatin 40 mg p.o. nightly  Depression and Anxiety Will continue with escitalopram 10 mL p.o. daily -If necessary will resume her alprazolam 0.25 mg p.o. nightly  GERD/GI Prophylaxis -Continue with pantoprazole 40 mg p.o. twice daily and famotidine 20 mg p.o. nightly  Normocytic Anemia -Hgb/Hct Trend: Recent Labs  Lab  11/18/23 1941 11/19/23 0545 11/20/23 0932 11/21/23 0526  HGB 14.1 12.6 12.0 11.5*  HCT 43.4 38.3 36.0 34.4*  MCV 91.2 91.0 92.8 90.8  -Anemia Panel and it showed a iron level of 31, UIBC 180, TIBC 211, saturation of 15%, ferritin 190, folate level 4.3, vitamin B12 640 -Continue monitor for signs of bleeding and repeat CBC within 1 week the patient is on anticoagulation  Hyperglycemia -Glucose Level Trend: Recent Labs  Lab 11/18/23 1941 11/19/23 0545 11/20/23 0932 11/21/23 0526  GLUCOSE 87 93 143* 77  -Check HbA1c in the AM   Hypoalbuminemia -Patient's Albumin Trend: Recent Labs  Lab 11/18/23 1941 11/20/23 0932 11/21/23 0526  ALBUMIN 4.0 2.7* 2.6*  -Continue to Monitor and Trend and repeat CMP in the AM  Class I Obesity -Complicates overall prognosis and care -Estimated body mass index is 31.64 kg/m as calculated from the following:   Height as of this encounter: 5\' 2"  (1.575 m).   Weight as of this encounter: 78.5 kg.  -Weight Loss and Dietary Counseling given

## 2023-11-20 NOTE — Progress Notes (Signed)
Mobility Specialist - Progress Note   11/20/23 1036  Mobility  Activity Ambulated with assistance in hallway  Level of Assistance Independent after set-up  Assistive Device None  Distance Ambulated (ft) 275 ft  Range of Motion/Exercises Active  Activity Response Tolerated well  Mobility Referral Yes  Mobility visit 1 Mobility  Mobility Specialist Start Time (ACUTE ONLY) 1009  Mobility Specialist Stop Time (ACUTE ONLY) 1025  Mobility Specialist Time Calculation (min) (ACUTE ONLY) 16 min   Received in chair and agreed to mobility. Some pain throughout L side. No other issues beyond that. Returned to chair with all needs met.  Marilynne Halsted Mobility Specialist

## 2023-11-20 NOTE — Progress Notes (Signed)
   11/20/23 1054  TOC Brief Assessment  Insurance and Status Reviewed  Patient has primary care physician Yes  Home environment has been reviewed Single family home  Prior level of function: Independent  Prior/Current Home Services No current home services  Social Determinants of Health Reivew SDOH reviewed no interventions necessary  Readmission risk has been reviewed Yes  Transition of care needs no transition of care needs at this time

## 2023-11-21 ENCOUNTER — Observation Stay (HOSPITAL_COMMUNITY): Payer: No Typology Code available for payment source

## 2023-11-21 ENCOUNTER — Encounter: Payer: Self-pay | Admitting: Family Medicine

## 2023-11-21 DIAGNOSIS — N63 Unspecified lump in unspecified breast: Secondary | ICD-10-CM | POA: Diagnosis not present

## 2023-11-21 DIAGNOSIS — I10 Essential (primary) hypertension: Secondary | ICD-10-CM | POA: Diagnosis not present

## 2023-11-21 DIAGNOSIS — E890 Postprocedural hypothyroidism: Secondary | ICD-10-CM | POA: Diagnosis not present

## 2023-11-21 DIAGNOSIS — I2699 Other pulmonary embolism without acute cor pulmonale: Secondary | ICD-10-CM | POA: Diagnosis not present

## 2023-11-21 LAB — RETICULOCYTES
Immature Retic Fract: 5.9 % (ref 2.3–15.9)
RBC.: 3.68 MIL/uL — ABNORMAL LOW (ref 3.87–5.11)
Retic Count, Absolute: 25.4 10*3/uL (ref 19.0–186.0)
Retic Ct Pct: 0.7 % (ref 0.4–3.1)

## 2023-11-21 LAB — IRON AND TIBC
Iron: 31 ug/dL (ref 28–170)
Saturation Ratios: 15 % (ref 10.4–31.8)
TIBC: 211 ug/dL — ABNORMAL LOW (ref 250–450)
UIBC: 180 ug/dL

## 2023-11-21 LAB — CBC WITH DIFFERENTIAL/PLATELET
Abs Immature Granulocytes: 0.02 10*3/uL (ref 0.00–0.07)
Basophils Absolute: 0 10*3/uL (ref 0.0–0.1)
Basophils Relative: 1 %
Eosinophils Absolute: 0.3 10*3/uL (ref 0.0–0.5)
Eosinophils Relative: 5 %
HCT: 34.4 % — ABNORMAL LOW (ref 36.0–46.0)
Hemoglobin: 11.5 g/dL — ABNORMAL LOW (ref 12.0–15.0)
Immature Granulocytes: 0 %
Lymphocytes Relative: 37 %
Lymphs Abs: 2.5 10*3/uL (ref 0.7–4.0)
MCH: 30.3 pg (ref 26.0–34.0)
MCHC: 33.4 g/dL (ref 30.0–36.0)
MCV: 90.8 fL (ref 80.0–100.0)
Monocytes Absolute: 0.5 10*3/uL (ref 0.1–1.0)
Monocytes Relative: 7 %
Neutro Abs: 3.5 10*3/uL (ref 1.7–7.7)
Neutrophils Relative %: 50 %
Platelets: 198 10*3/uL (ref 150–400)
RBC: 3.79 MIL/uL — ABNORMAL LOW (ref 3.87–5.11)
RDW: 12.4 % (ref 11.5–15.5)
WBC: 6.9 10*3/uL (ref 4.0–10.5)
nRBC: 0 % (ref 0.0–0.2)

## 2023-11-21 LAB — VITAMIN B12: Vitamin B-12: 614 pg/mL (ref 180–914)

## 2023-11-21 LAB — COMPREHENSIVE METABOLIC PANEL
ALT: 10 U/L (ref 0–44)
AST: 11 U/L — ABNORMAL LOW (ref 15–41)
Albumin: 2.6 g/dL — ABNORMAL LOW (ref 3.5–5.0)
Alkaline Phosphatase: 61 U/L (ref 38–126)
Anion gap: 6 (ref 5–15)
BUN: 12 mg/dL (ref 8–23)
CO2: 23 mmol/L (ref 22–32)
Calcium: 8.8 mg/dL — ABNORMAL LOW (ref 8.9–10.3)
Chloride: 105 mmol/L (ref 98–111)
Creatinine, Ser: 0.61 mg/dL (ref 0.44–1.00)
GFR, Estimated: >60 mL/min (ref >60)
Glucose, Bld: 77 mg/dL (ref 70–99)
Potassium: 3.5 mmol/L (ref 3.5–5.1)
Sodium: 134 mmol/L — ABNORMAL LOW (ref 135–145)
Total Bilirubin: 0.5 mg/dL (ref <1.2)
Total Protein: 5.5 g/dL — ABNORMAL LOW (ref 6.5–8.1)

## 2023-11-21 LAB — FOLATE: Folate: 4.3 ng/mL — ABNORMAL LOW (ref >5.9)

## 2023-11-21 LAB — MAGNESIUM: Magnesium: 2.1 mg/dL (ref 1.7–2.4)

## 2023-11-21 LAB — FERRITIN: Ferritin: 190 ng/mL (ref 11–307)

## 2023-11-21 LAB — PHOSPHORUS: Phosphorus: 3.1 mg/dL (ref 2.5–4.6)

## 2023-11-21 MED ORDER — ACETAMINOPHEN 325 MG PO TABS: 650.0000 mg | ORAL_TABLET | Freq: Four times a day (QID) | ORAL | 0 refills | Status: DC | PRN

## 2023-11-21 MED ORDER — ELIQUIS 5 MG PO TABS: ORAL_TABLET | ORAL | Status: DC

## 2023-11-21 MED ORDER — ONDANSETRON HCL 4 MG PO TABS: 4.0000 mg | ORAL_TABLET | Freq: Four times a day (QID) | ORAL | 0 refills | Status: DC | PRN

## 2023-11-21 NOTE — Progress Notes (Signed)
Patient Saturations on Room Air at Rest = 98%  Patient Saturations on [employer] while Ambulating = 97%   Levora Angel, RN

## 2023-11-21 NOTE — Discharge Summary (Signed)
Physician Discharge Summary   Patient: Alison [name] MRN: [id number] DOB: 03-Jul-1961  Admit date:     11/18/2023  Discharge date: 11/21/2023  Discharge Physician: Marguerita Merles, DO   PCP: Willow Ora, MD   Recommendations at discharge:   Follow-up with PCP within 1 to 2 weeks and repeat CBC, CMP, mag, Phos within 1 week Follow-up with hematology in outpatient setting for outpatient hypercoagulable workup Follow-up with pulmonary outpatient setting if necessary and repeat chest x-ray in 3 to 6 weeks Follow-up with cardiology outpatient setting for bicuspid atrial valve Will need further workup and evaluation for the right breast nodular density noted and will need outpatient mammogram with PCP after hospitalization  Discharge Diagnoses: Principal Problem:   Acute pulmonary embolism without acute cor pulmonale (HCC) Active Problems:   Postablative hypothyroidism   Subcutaneous nodule of breast   Essential hypertension   Systolic murmur  Resolved Problems:   * No resolved hospital problems. [facility] Course: The patient is a 62 year old African-American female with a past medical history significant for but not limited to hypertension, hyperlipidemia, hypothyroidism, bicuspid aortic valve and many other surgeries presented to the ED on 11/18/2023 with shortness of breath and found to have an acute PE.  She had driven to [facility] developed leg cramping abrupt onset of right sided pleuritic chest pain and went to the ED with segmental PEs were confirmed the right by CT scan without RV strain.  At that time admission was recommended but she opted to drive back to to [facility] to be admitted given that she needed a take care of her elderly mother with dementia.  At that time she was not hypoxic and not in respiratory distress.  She had pain after having IV morphine so she was admitted yesterday morning by Dr. Julian Reil and echocardiogram and lower extremity venous duplex were  ordered.  She is found to have an acute right leg DVT and normal EF on echo findings.  She is now on anticoagulation but spiked a temperature overnight so we will monitor to see if she has any infection and anticipate discharging her she is afebrile in the next 24 hours  Assessment and Plan:  Acute Provoked Right Segmental PE with Associated Right Leg DVT -Continue Anticoagulation with Apixaban -LE Venous Duplex done and showed "Findings consistent with acute deep vein thrombosis involving the right popliteal vein, and right peroneal veins." -Evidence of pulmonary infarct noted, the cause of her pain.  -ECHOCardiogram done and showed EF of 60-65% and Grade 1 DD with Normal R Ventricular Systolic Function -Give oxycodone and if needed morphine for refractory pain -Dr. Jarvis Newcomer discussed with her that this is to facilitate her mobility/incentive spirometry use.   Fever -Had a Temp of 101 over night and likely in the setting of PE/DVT but want to rule out infection -Check Blood Cx x2, Urinalysis and repeat CXR in the AM  -U/A negative -CXR this AM done and showed "Worsening right basilar opacity with possible new small pleural effusion. This could reflect worsening pulmonary infarct, atelectasis, or pneumonia." -Blood Cx x2 Pending  -Currently has no Leukocytosis as WBC Trend: Recent Labs  Lab 11/18/23 1941 11/19/23 0545 11/20/23 0932  WBC 10.4 9.8 6.9  -Continue to Monitor and Trend and Repeat CBC and Monitor Fever Curve   Hypotension but has a history of Hypertension -Without tachycardia, overt bleeding, source for sepsis, etc. suspect morphine is contributing.  -Hold antihypertensives at this time occluding hydrochlorothiazide -She was given  1L NS over 10 hours and monitor closely.  -Continue Monitor BP per Protocol -Last BP reading was 123/66    Right Breast Nodular Density -Seen on CT.  -Pt well aware and will pursue mammogram thru her PCP after hospitalization.  -It is odd that  she's had many surgeries and no previous DVTs but had one after a 6 hour nonstop trip to [place]   Hypothyroidism -TSH was 0.020 and so we have decreased her levothyroxine dose to 100 mcg p.o. daily  Hyperlipidemia -Continue Rosuvastatin 40 mg p.o. nightly  Depression and Anxiety Will continue with escitalopram 10 mL p.o. daily -If necessary will resume her alprazolam 0.25 mg p.o. nightly  GERD/GI Prophylaxis -Continue with pantoprazole 40 mg p.o. twice daily and famotidine 20 mg p.o. nightly  Hyperglycemia -Glucose Level Trend: Recent Labs  Lab 11/18/23 1941 11/19/23 0545 11/20/23 0932  GLUCOSE 87 93 143*  -Check HbA1c in the AM   Hypoalbuminemia -Patient's Albumin Trend: Recent Labs  Lab 11/18/23 1941 11/20/23 0932  ALBUMIN 4.0 2.7*  -Continue to Monitor and Trend and repeat CMP in the AM  Consultants: PCCM/Pulmonary  Procedures performed: As delineated as above Disposition: Home Diet recommendation:  Discharge Diet Orders (From admission, onward)     Start     Ordered   11/21/23 0000  Diet - low sodium heart healthy        11/21/23 1143           Cardiac diet DISCHARGE MEDICATION: Allergies as of 11/21/2023       Reactions   Aspirin Anaphylaxis   Cherry Hives, Shortness Of Breath   Other Hives, Itching   Walnuts and pecans - cause itching and hives in mouth   Nsaids Other (See Comments)   Per patient, Drs have told her not to take nsaids because she was a bad reaction to aspirin   Soy Allergy (do Not Select) Hives   " hives in mouth and wheezing"    Penicillins Itching   Has patient had a PCN reaction causing immediate rash, facial/tongue/throat swelling, SOB or lightheadedness with hypotension: No Has patient had a PCN reaction causing severe rash involving mucus membranes or skin necrosis: No Has patient had a PCN reaction that required hospitalization: No Has patient had a PCN reaction occurring within the last 10 years: No If all of the above  answers are "NO", then may proceed with Cephalosporin use.        Medication List     STOP taking these medications    ALPRAZolam 0.25 MG tablet Commonly known as: XANAX   potassium chloride SA 20 MEQ tablet Commonly known as: KLOR-CON M       TAKE these medications    acetaminophen 325 MG tablet Commonly known as: TYLENOL Take 2 tablets (650 mg total) by mouth every 6 (six) hours as needed for mild pain (pain score 1-3) (or Fever >/= 101).   albuterol 108 (90 Base) MCG/ACT inhaler Commonly known as: VENTOLIN HFA Inhale 2 puffs into the lungs every 6 (six) hours as needed. For asthma   Blood Pressure Monitoring Soln Kit   calcium carbonate 500 MG chewable tablet Commonly known as: TUMS - dosed in mg elemental calcium Chew 3 tablets by mouth 3 (three) times daily as needed for indigestion or heartburn.   Eliquis 5 MG Tabs tablet Generic drug: apixaban Take 2 tablets (10 mg total) by mouth 2 (two) times daily for 7 days, THEN 1 tablet (5 mg total) 2 (two) times daily  for 23 days. Take 10 mg BID for 7 days and then change. Start taking on: November 21, 2023 What changed: See the new instructions.   escitalopram 10 MG tablet Commonly known as: LEXAPRO TAKE 1 TABLET BY MOUTH EVERY DAY   famotidine 20 MG tablet Commonly known as: PEPCID Take 1 tablet (20 mg total) by mouth at bedtime.   hydrochlorothiazide 12.5 MG capsule Commonly known as: MICROZIDE Take 1 capsule (12.5 mg total) by mouth daily. What changed: when to take this   levothyroxine 125 MCG tablet Commonly known as: SYNTHROID TAKE 1 TABLET BY MOUTH DAILY BEFORE BREAKFAST.   ondansetron 4 MG tablet Commonly known as: ZOFRAN Take 1 tablet (4 mg total) by mouth every 6 (six) hours as needed for nausea.   pantoprazole 40 MG tablet Commonly known as: PROTONIX 1 tablet twice daily for 8 weeks What changed:  how much to take how to take this when to take this   rOPINIRole 3 MG tablet Commonly known  as: REQUIP Take 1 tablet (3 mg total) by mouth at bedtime.   rosuvastatin 40 MG tablet Commonly known as: CRESTOR Take 1 tablet (40 mg total) by mouth at bedtime.       Discharge Exam: Filed Weights   11/18/23 1832  Weight: 78.5 kg   Vitals:   11/20/23 1915 11/21/23 0539  BP: 118/70 133/87  Pulse: 78 63  Resp: 20 20  Temp: 98.4 F (36.9 C) 98 F (36.7 C)  SpO2: 100% 96%   Examination: Physical Exam:  Constitutional: WN/WD obese female in no acute distress appears calm Respiratory: Diminished to auscultation bilaterally, no wheezing, rales, rhonchi or crackles. Normal respiratory effort and patient is not tachypenic. No accessory muscle use.  Unlabored breathing Cardiovascular: RRR, no murmurs / rubs / gallops. S1 and S2 auscultated.  Has mild to 1+ extremity edema Abdomen: Soft, non-tender, distended secondary to body habitus. Bowel sounds positive.  GU: Deferred. Musculoskeletal: No clubbing / cyanosis of digits/nails. No joint deformity upper and lower extremities.  Skin: No rashes, lesions, ulcers on limited skin evaluation. No induration; Warm and dry.  Neurologic: CN 2-12 grossly intact with no focal deficits. Romberg sign and cerebellar reflexes not assessed.  Psychiatric: Normal judgment and insight. Alert and oriented x 3. Normal mood and appropriate affect.   Condition at discharge: stable  The results of significant diagnostics from this hospitalization (including imaging, microbiology, ancillary and laboratory) are listed below for reference.   Imaging Studies: DG CHEST PORT 1 VIEW Result Date: 11/21/2023 CLINICAL DATA:  141880 SOB (shortness of breath) [id number] EXAM: PORTABLE CHEST 1 VIEW COMPARISON:  January 20, 2023 FINDINGS: The cardiomediastinal silhouette is unchanged and enlarged in contour.Atherosclerotic calcifications. No pneumothorax. Persistent heterogeneous opacification of the RIGHT lower lung base likely reflecting a combination of airspace  opacity and pleural fluid favored trace LEFT pleural effusion. IMPRESSION: Persistent heterogeneous opacification of the RIGHT lower lung base likely reflecting a combination of airspace opacity and pleural fluid. Given reported history of pulmonary embolism, this could reflect pulmonary infarct or infection. Electronically Signed   By: Meda Klinefelter M.D.   On: 11/21/2023 08:00   DG CHEST PORT 1 VIEW Result Date: 11/20/2023 CLINICAL DATA:  Fever.  Known pulmonary embolism. EXAM: PORTABLE CHEST 1 VIEW COMPARISON:  Chest x-ray dated November 18, 2023. FINDINGS: Normal heart size. Low lung volumes. Worsening right basilar opacity with possible new small pleural effusion. Slightly worsened mild left basilar atelectasis. No pneumothorax. No acute osseous abnormality. IMPRESSION: 1. Worsening right  basilar opacity with possible new small pleural effusion. This could reflect worsening pulmonary infarct, atelectasis, or pneumonia. Electronically Signed   By: Obie Dredge M.D.   On: 11/20/2023 13:04   ECHOCARDIOGRAM COMPLETE Result Date: 11/19/2023    ECHOCARDIOGRAM REPORT   Patient Name:   CEDAR GIGLIO Date of Exam: 11/19/2023 Medical Rec #:  [id number]    Height:       62.0 in Accession #:    [id number]   Weight:       173.0 lb Date of Birth:  May 10, 1961     BSA:          1.797 m Patient Age:    62 years     BP:           103/58 mmHg Patient Gender: F            HR:           79 bpm. Exam Location:  Inpatient Procedure: 2D Echo, Color Doppler and Cardiac Doppler Indications:    Pulmonary Embolus I26.09  History:        Patient has prior history of Echocardiogram examinations, most                 recent 08/06/2023.  Sonographer:    Harriette Bouillon RDCS Referring Phys: [phone] JARED M GARDNER IMPRESSIONS  1. Left ventricular ejection fraction, by estimation, is 60 to 65%. The left ventricle has normal function. The left ventricle has no regional wall motion abnormalities. Left ventricular diastolic parameters are  consistent with Grade I diastolic dysfunction (impaired relaxation).  2. Right ventricular systolic function is normal. The right ventricular size is normal. There is normal pulmonary artery systolic pressure. The estimated right ventricular systolic pressure is 29.4 mmHg.  3. The mitral valve is normal in structure. No evidence of mitral valve regurgitation. No evidence of mitral stenosis.  4. The aortic valve is tricuspid. There is moderate calcification of the aortic valve. There is moderate thickening of the aortic valve. Aortic valve regurgitation is not visualized. Mild to moderate aortic valve stenosis. Aortic valve mean gradient measures 14.0 mmHg. Aortic valve Vmax measures 2.40 m/s. Comparison(s): No significant change from prior study. Prior images reviewed side by side. FINDINGS  Left Ventricle: Left ventricular ejection fraction, by estimation, is 60 to 65%. The left ventricle has normal function. The left ventricle has no regional wall motion abnormalities. The left ventricular internal cavity size was normal in size. There is  no left ventricular hypertrophy. Left ventricular diastolic parameters are consistent with Grade I diastolic dysfunction (impaired relaxation). Indeterminate filling pressures. Right Ventricle: The right ventricular size is normal. No increase in right ventricular wall thickness. Right ventricular systolic function is normal. There is normal pulmonary artery systolic pressure. The tricuspid regurgitant velocity is 2.57 m/s, and  with an assumed right atrial pressure of 3 mmHg, the estimated right ventricular systolic pressure is 29.4 mmHg. Left Atrium: Left atrial size was normal in size. Right Atrium: Right atrial size was normal in size. Pericardium: There is no evidence of pericardial effusion. Mitral Valve: The mitral valve is normal in structure. Mild to moderate mitral annular calcification. No evidence of mitral valve regurgitation. No evidence of mitral valve stenosis.  Tricuspid Valve: The tricuspid valve is normal in structure. Tricuspid valve regurgitation is mild. Aortic Valve: The aortic valve is tricuspid. There is moderate calcification of the aortic valve. There is moderate thickening of the aortic valve. Aortic valve regurgitation is not visualized. Mild to moderate  aortic stenosis is present. Aortic valve mean gradient measures 14.0 mmHg. Aortic valve peak gradient measures 22.9 mmHg. Aortic valve area, by VTI measures 1.38 cm. Pulmonic Valve: The pulmonic valve was not well visualized. Pulmonic valve regurgitation is not visualized. No evidence of pulmonic stenosis. Aorta: The aortic root and ascending aorta are structurally normal, with no evidence of dilitation. IAS/Shunts: No atrial level shunt detected by color flow Doppler.  LEFT VENTRICLE PLAX 2D LVIDd:         3.90 cm   Diastology LVIDs:         2.50 cm   LV e' medial:    5.55 cm/s LV PW:         0.80 cm   LV E/e' medial:  17.0 LV IVS:        0.80 cm   LV e' lateral:   9.46 cm/s LVOT diam:     1.90 cm   LV E/e' lateral: 10.0 LV SV:         63 LV SV Index:   35 LVOT Area:     2.84 cm  RIGHT VENTRICLE             IVC RV S prime:     19.70 cm/s  IVC diam: 1.60 cm TAPSE (M-mode): 2.3 cm LEFT ATRIUM             Index        RIGHT ATRIUM           Index LA diam:        3.70 cm 2.06 cm/m   RA Area:     16.00 cm LA Vol (A2C):   29.7 ml 16.52 ml/m  RA Volume:   40.30 ml  22.42 ml/m LA Vol (A4C):   37.7 ml 20.97 ml/m LA Biplane Vol: 33.7 ml 18.75 ml/m  AORTIC VALVE AV Area (Vmax):    1.55 cm AV Area (Vmean):   1.38 cm AV Area (VTI):     1.38 cm AV Vmax:           239.50 cm/s AV Vmean:          179.500 cm/s AV VTI:            0.456 m AV Peak Grad:      22.9 mmHg AV Mean Grad:      14.0 mmHg LVOT Vmax:         131.00 cm/s LVOT Vmean:        87.500 cm/s LVOT VTI:          0.222 m LVOT/AV VTI ratio: 0.49  AORTA Ao Root diam: 2.80 cm Ao Asc diam:  3.30 cm MITRAL VALVE                TRICUSPID VALVE MV Area (PHT): 3.99  cm     TR Peak grad:   26.4 mmHg MV Decel Time: 190 msec     TR Vmax:        257.00 cm/s MV E velocity: 94.50 cm/s MV A velocity: 111.00 cm/s  SHUNTS MV E/A ratio:  0.85         Systemic VTI:  0.22 m                             Systemic Diam: 1.90 cm Rachelle Hora Croitoru MD Electronically signed by Thurmon Fair MD Signature Date/Time: 11/19/2023/4:31:53 PM    Final    VAS [place] LOWER EXTREMITY  VENOUS (DVT) Result Date: 11/19/2023  Lower Venous DVT Study Patient Name:  LANISA ACAMPORA  Date of Exam:   11/19/2023 Medical Rec #: [id number]     Accession #:    [id number] Date of Birth: 1961/10/05      Patient Gender: F Patient Age:   2 years Exam Location:  [facility] Procedure:      VAS [place] LOWER EXTREMITY VENOUS (DVT) Referring Phys: Lyda Perone --------------------------------------------------------------------------------  Indications: Pulmonary embolism.  Risk Factors: Confirmed PE. Anticoagulation: Eliquis. Limitations: Poor ultrasound/tissue interface. Comparison Study: No prior studies. Performing Technologist: Chanda Busing RVT  Examination Guidelines: A complete evaluation includes B-mode imaging, spectral Doppler, color Doppler, and power Doppler as needed of all accessible portions of each vessel. Bilateral testing is considered an integral part of a complete examination. Limited examinations for reoccurring indications may be performed as noted. The reflux portion of the exam is performed with the patient in reverse Trendelenburg.  +---------+---------------+---------+-----------+----------+--------------+ RIGHT    CompressibilityPhasicitySpontaneityPropertiesThrombus Aging +---------+---------------+---------+-----------+----------+--------------+ CFV      Full           Yes      Yes                                 +---------+---------------+---------+-----------+----------+--------------+ SFJ      Full                                                         +---------+---------------+---------+-----------+----------+--------------+ FV Prox  Full                                                        +---------+---------------+---------+-----------+----------+--------------+ FV Mid   Full                                                        +---------+---------------+---------+-----------+----------+--------------+ FV DistalFull                                                        +---------+---------------+---------+-----------+----------+--------------+ PFV      Full                                                        +---------+---------------+---------+-----------+----------+--------------+ POP      Partial        Yes      Yes                  Acute          +---------+---------------+---------+-----------+----------+--------------+ PTV      Full                                                        +---------+---------------+---------+-----------+----------+--------------+  PERO     None                                         Acute          +---------+---------------+---------+-----------+----------+--------------+ Gastroc  Full                                                        +---------+---------------+---------+-----------+----------+--------------+   +---------+---------------+---------+-----------+----------+--------------+ LEFT     CompressibilityPhasicitySpontaneityPropertiesThrombus Aging +---------+---------------+---------+-----------+----------+--------------+ CFV      Full           Yes      Yes                                 +---------+---------------+---------+-----------+----------+--------------+ SFJ      Full                                                        +---------+---------------+---------+-----------+----------+--------------+ FV Prox  Full                                                         +---------+---------------+---------+-----------+----------+--------------+ FV Mid   Full                                                        +---------+---------------+---------+-----------+----------+--------------+ FV DistalFull                                                        +---------+---------------+---------+-----------+----------+--------------+ PFV      Full                                                        +---------+---------------+---------+-----------+----------+--------------+ POP      Full           Yes      Yes                                 +---------+---------------+---------+-----------+----------+--------------+ PTV      Full                                                        +---------+---------------+---------+-----------+----------+--------------+  PERO     Full                                                        +---------+---------------+---------+-----------+----------+--------------+     Summary: RIGHT: - Findings consistent with acute deep vein thrombosis involving the right popliteal vein, and right peroneal veins.  - No cystic structure found in the popliteal fossa.  LEFT: - There is no evidence of deep vein thrombosis in the lower extremity.  - No cystic structure found in the popliteal fossa.  *See table(s) above for measurements and observations. Electronically signed by Gerarda Fraction on 11/19/2023 at 9:59:19 AM.    Final    DG Chest Port 1 View Result Date: 11/18/2023 CLINICAL DATA:  Known PEs, shortness of breath, right rib pain EXAM: PORTABLE CHEST 1 VIEW COMPARISON:  05/16/2021 FINDINGS: Patchy right lower lobe opacity. 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 Mild left basilar opacity, possibly atelectasis. No pleural effusion or pneumothorax. The heart is normal in size. Cervical spine fixation hardware. 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 Electronically Signed   By: Charline Bills M.D.   On: 11/18/2023 20:13   Microbiology: Results for orders placed or performed during the hospital encounter of 11/18/23  Culture, blood (Routine X 2) w Reflex to ID Panel     Status: None (Preliminary result)   Collection Time: 11/20/23  9:22 AM   Specimen: BLOOD RIGHT ARM  Result Value Ref Range Status   Specimen Description   Final    BLOOD RIGHT ARM Performed at [facility], [address] [place], [place] [address]    Special Requests   Final    BOTTLES DRAWN AEROBIC AND ANAEROBIC Blood Culture results may not be optimal due to an inadequate volume of blood received in culture bottles Performed at [facility], [address] [place], [place] [address]    Culture   Final    NO GROWTH < 24 HOURS Performed at [facility], [address] [place], [place] [address]    Report Status PENDING  Incomplete  Culture, blood (Routine X 2) w Reflex to ID Panel     Status: None (Preliminary result)   Collection Time: 11/20/23  9:32 AM   Specimen: BLOOD LEFT HAND  Result Value Ref Range Status   Specimen Description   Final    BLOOD LEFT HAND Performed at [facility], [address] [place], [place] [address]    Special Requests   Final    BOTTLES DRAWN AEROBIC ONLY Blood Culture results may not be optimal due to an inadequate volume of blood received in culture bottles Performed at [facility], [address] [place], [place] [address]    Culture   Final    NO GROWTH < 24 HOURS Performed at [facility], [address], [place], [place] [address]    Report Status PENDING  Incomplete   Labs: CBC: Recent Labs  Lab 11/18/23 1941 11/19/23 0545 11/20/23 0932 11/21/23 0526  WBC 10.4 9.8 6.9 6.9  NEUTROABS  --   --  4.5 3.5  HGB 14.1 12.6 12.0 11.5*  HCT 43.4 38.3 36.0  34.4*   MCV 91.2 91.0 92.8 90.8  PLT 201 195 193 198   Basic Metabolic Panel: Recent Labs  Lab 11/18/23 1941 11/19/23 0545 11/20/23 0932 11/21/23 0526  NA 138 137 135 134*  K 4.0 4.1 3.5 3.5  CL 105 104 106 105  CO2 24 24 22 23   GLUCOSE 87 93 143* 77  BUN 11 15 13 12   CREATININE 0.47 0.70 0.55 0.61  CALCIUM 9.9 9.2 9.0 8.8*  MG  --   --  2.0 2.1  PHOS  --   --  2.6 3.1   Liver Function Tests: Recent Labs  Lab 11/18/23 1941 11/20/23 0932 11/21/23 0526  AST 14* 11* 11*  ALT 12 8 10   ALKPHOS 78 61 61  BILITOT 0.9 0.5 0.5  PROT 7.6 5.8* 5.5*  ALBUMIN 4.0 2.7* 2.6*   CBG: No results for input(s): "GLUCAP" in the last 168 hours.  Discharge time spent: greater than 30 minutes.  Signed: Marguerita Merles, DO [facility] 11/21/2023

## 2023-11-25 ENCOUNTER — Other Ambulatory Visit: Payer: Self-pay | Admitting: Family Medicine

## 2023-11-25 DIAGNOSIS — Z0000 Encounter for general adult medical examination without abnormal findings: Secondary | ICD-10-CM

## 2023-11-25 LAB — CULTURE, BLOOD (ROUTINE X 2)
Culture: NO GROWTH
Culture: NO GROWTH

## 2023-11-28 ENCOUNTER — Encounter (HOSPITAL_COMMUNITY): Payer: Self-pay | Admitting: *Deleted

## 2023-11-28 ENCOUNTER — Other Ambulatory Visit: Payer: Self-pay | Admitting: Family Medicine

## 2023-11-28 ENCOUNTER — Ambulatory Visit: Payer: No Typology Code available for payment source | Admitting: Family Medicine

## 2023-11-28 ENCOUNTER — Encounter: Payer: Self-pay | Admitting: Family Medicine

## 2023-11-28 VITALS — BP 132/88 | HR 77 | Temp 97.9°F | Ht 62.0 in | Wt 169.4 lb

## 2023-11-28 DIAGNOSIS — E8809 Other disorders of plasma-protein metabolism, not elsewhere classified: Secondary | ICD-10-CM

## 2023-11-28 DIAGNOSIS — K219 Gastro-esophageal reflux disease without esophagitis: Secondary | ICD-10-CM | POA: Diagnosis not present

## 2023-11-28 DIAGNOSIS — E890 Postprocedural hypothyroidism: Secondary | ICD-10-CM | POA: Diagnosis not present

## 2023-11-28 DIAGNOSIS — E46 Unspecified protein-calorie malnutrition: Secondary | ICD-10-CM

## 2023-11-28 DIAGNOSIS — I82431 Acute embolism and thrombosis of right popliteal vein: Secondary | ICD-10-CM | POA: Diagnosis not present

## 2023-11-28 DIAGNOSIS — Z23 Encounter for immunization: Secondary | ICD-10-CM | POA: Diagnosis not present

## 2023-11-28 DIAGNOSIS — N6311 Unspecified lump in the right breast, upper outer quadrant: Secondary | ICD-10-CM

## 2023-11-28 DIAGNOSIS — D242 Benign neoplasm of left breast: Secondary | ICD-10-CM

## 2023-11-28 DIAGNOSIS — Z86718 Personal history of other venous thrombosis and embolism: Secondary | ICD-10-CM | POA: Insufficient documentation

## 2023-11-28 DIAGNOSIS — I2699 Other pulmonary embolism without acute cor pulmonale: Secondary | ICD-10-CM | POA: Diagnosis not present

## 2023-11-28 DIAGNOSIS — N63 Unspecified lump in unspecified breast: Secondary | ICD-10-CM

## 2023-11-28 DIAGNOSIS — I10 Essential (primary) hypertension: Secondary | ICD-10-CM | POA: Diagnosis not present

## 2023-11-28 LAB — COMPREHENSIVE METABOLIC PANEL
ALT: 15 U/L (ref 0–35)
AST: 16 U/L (ref 0–37)
Albumin: 3.7 g/dL (ref 3.5–5.2)
Alkaline Phosphatase: 82 U/L (ref 39–117)
BUN: 15 mg/dL (ref 6–23)
CO2: 25 meq/L (ref 19–32)
Calcium: 9.9 mg/dL (ref 8.4–10.5)
Chloride: 106 meq/L (ref 96–112)
Creatinine, Ser: 0.68 mg/dL (ref 0.40–1.20)
GFR: 93.81 mL/min (ref >60.00)
Glucose, Bld: 89 mg/dL (ref 70–99)
Potassium: 3.8 meq/L (ref 3.5–5.1)
Sodium: 138 meq/L (ref 135–145)
Total Bilirubin: 0.3 mg/dL (ref 0.2–1.2)
Total Protein: 6.5 g/dL (ref 6.0–8.3)

## 2023-11-28 LAB — CBC WITH DIFFERENTIAL/PLATELET
Basophils Absolute: 0 10*3/uL (ref 0.0–0.1)
Basophils Relative: 0.7 % (ref 0.0–3.0)
Eosinophils Absolute: 0.2 10*3/uL (ref 0.0–0.7)
Eosinophils Relative: 3.6 % (ref 0.0–5.0)
HCT: 41.7 % (ref 36.0–46.0)
Hemoglobin: 14 g/dL (ref 12.0–15.0)
Lymphocytes Relative: 26 % (ref 12.0–46.0)
Lymphs Abs: 1.4 10*3/uL (ref 0.7–4.0)
MCHC: 33.6 g/dL (ref 30.0–36.0)
MCV: 89.7 fL (ref 78.0–100.0)
Monocytes Absolute: 0.3 10*3/uL (ref 0.1–1.0)
Monocytes Relative: 5.9 % (ref 3.0–12.0)
Neutro Abs: 3.5 10*3/uL (ref 1.4–7.7)
Neutrophils Relative %: 63.8 % (ref 43.0–77.0)
Platelets: 338 10*3/uL (ref 150.0–400.0)
RBC: 4.64 Mil/uL (ref 3.87–5.11)
RDW: 13.2 % (ref 11.5–15.5)
WBC: 5.4 10*3/uL (ref 4.0–10.5)

## 2023-11-28 MED ORDER — ELIQUIS 5 MG PO TABS: 5.0000 mg | ORAL_TABLET | Freq: Two times a day (BID) | ORAL | Status: DC

## 2023-11-28 MED ORDER — LEVOTHYROXINE SODIUM 100 MCG PO TABS
100.0000 ug | ORAL_TABLET | Freq: Every day | ORAL | 3 refills | Status: DC
Start: 2023-11-28 — End: 2024-01-13

## 2023-11-28 NOTE — Progress Notes (Signed)
See mychart note Dear Ms. Dowse, Your lab results have all improved. Things look better. Please take care, eat well and I will see you in 6 weeks.  Sincerely, Dr. Mardelle Matte

## 2023-11-28 NOTE — Patient Instructions (Addendum)
Please return in 6 weeks for recheck.  I will release your lab results to you on your MyChart account with further instructions. You may see the results before I do, but when I review them I will send you a message with my report or have my assistant call you if things need to be discussed. Please reply to my message with any questions. Thank you!   If you have any questions or concerns, please don't hesitate to send me a message via MyChart or call the office at [phone]. Thank you for visiting with [place] today! It's our pleasure caring for you.   VISIT SUMMARY:  You came in today for a follow-up after your recent hospitalization. You reported feeling well with improved breathing and no pain. We discussed your current medications, weight loss, and nutritional status, as well as your history of various health conditions.  YOUR PLAN:  -DEEP VEIN THROMBOSIS (DVT) AND PULMONARY EMBOLISM (PE): DVT is a blood clot in a deep vein, often in the legs, which can travel to the lungs and cause a PE. You are currently taking Eliquis 5 mg twice daily to prevent further clots. We will reassess your condition in 6 weeks.  -HYPERTENSION: Hypertension is high blood pressure. Your blood pressure was low in the hospital, so you have stopped taking hydrochlorothiazide. Please monitor your blood pressure at home and we can restart medication if it becomes elevated again. due to your weight loss, it may have resolved.   -HYPOTHYROIDISM: Hypothyroidism is when your thyroid gland doesn't produce enough hormones. Due to your weight loss, we are reducing your levothyroxine dose to 100 mcg daily. We will recheck your thyroid levels in six weeks.  -NUTRITIONAL DEFICIENCY AND MUSCLE WASTING: You have lost significant weight and have low protein levels, leading to muscle wasting. It's important to increase your protein intake through diet and supplements. Consider high-protein foods like peanut butter, quinoa, beans, and  protein shakes, and a multivitamin to address your folic acid deficiency  -HIATAL HERNIA AND GASTROESOPHAGEAL REFLUX DISEASE (GERD): A hiatal hernia occurs when part of the stomach pushes up into the chest, causing acid reflux. Continue taking famotidine as needed and try to eat small, frequent meals to manage your symptoms.  -BREAST LESION: You have an abnormal breast lesion and are due for a diagnostic mammogram. This imaging is important to rule out any malignancy. I have ordered a diagnostic mammogram.  -GENERAL HEALTH MAINTENANCE: Your folic acid level is low, likely due to nutritional deficiencies. We recommend taking an over-the-counter multivitamin to address this.  INSTRUCTIONS:  Please follow up in six weeks to recheck your thyroid levels and blood pressure. We will also reassess your nutritional status and protein intake, and evaluate your response to the reduced levothyroxine dose.

## 2023-11-28 NOTE — Progress Notes (Signed)
Subjective  CC:  Chief Complaint  Patient presents with   Hospitalization Follow-up    11/18/2023 - 11/21/2023 (3 days) [facility]   Acute pulmonary embolism without acute cor pulmonale      HPI: Alison [name] is a 62 y.o. female who presents to the office today to address the problems listed above in the chief complaint. Discussed the use of AI scribe software for clinical note transcription with the patient, who gave verbal consent to proceed.  History of Present Illness   The patient, with a history of DVT and PE, presents for follow-up after recent hospitalization.  I reviewed notes from [facility] and our hospital. Reviewed CT scan and labs and cxr. + right DVT after travel to [facility], then developed right lower chest pain: found to have right LL PE w/ wedge shaped infarct. Treated with eloquis in [place] and monitored due to fever. Since d/c, She reports feeling well, with improved breathing and no pain. She was started on Eliquis for anticoagulation. She denies any bleeding or need for pain medication. She was previously on hydrochlorothiazide for hypertension, but has stopped taking it due to low blood pressure readings. She has lost significant weight, from 230 lbs to 169 lbs since her gastric surgery , and has been noted to have muscle wasting and low protein stores, ablumin 2.7. . She reports a lack of appetite and difficulty eating large amounts due to a hiatal hernia. She also has a history of a TIA, thought to be related to birth control pill use. She has an abnormal breast lesion and is due for a mammogram. She also has a history of thyroid disease and is currently on levothyroxine 125 mcg, which may need to be adjusted due to weight loss. TSH remains low in spite of lowering dose in October.     Assessment  1. Other acute pulmonary embolism without acute cor pulmonale (HCC)   2. Need for influenza vaccination   3. Gastroesophageal reflux disease without esophagitis   4.  Hypoalbuminemia due to protein-calorie malnutrition (HCC)   5. Acute deep vein thrombosis (DVT) of right popliteal vein (HCC)   6. Essential hypertension   7. Postablative hypothyroidism   8. Papilloma of left breast   9. Mass of upper outer quadrant of right breast      Plan  Assessment and Plan    Deep Vein Thrombosis (DVT) and Pulmonary Embolism (PE) DVT in the right leg led to PE, likely due to prolonged immobility. Currently on Eliquis 5 mg BID. No signs of bleeding or pain, and breathing is improving. Discussed hematology referral for anticoagulation duration and further assessment. Informed about risks of continued anticoagulation, including bleeding, and benefits of preventing further clots. Discussed potential need for blood work to rule out other blood disorders. - Continue Eliquis 5 mg BID - likely 3-6 months of treatment since provoked - Reassess in three months to determine anticoagulation duration, and will perform hypercoagulability work up once treatment is complete  Hypertension Blood pressure was low in the hospital, and has not been taking hydrochlorothiazide. Advised to monitor blood pressure at home and restart medication if hypertension recurs. Discussed risks of untreated hypertension and benefits of medication in preventing complications. - Discontinue hydrochlorothiazide - Monitor blood pressure at home - Restart hydrochlorothiazide if blood pressure increases  Hypothyroidism TSH levels are low on current dose of levothyroxine 125 mcg. Weight loss may necessitate a lower dose. Informed about risks of overmedication, including hyperthyroidism symptoms, and benefits  of dose adjustment to current weight. - Reduce levothyroxine to 100 mcg daily - Recheck TSH levels in six weeks  Nutritional Deficiency and Muscle Wasting Significant weight loss and low protein intake leading to muscle wasting and low albumin levels. Low appetite and difficulty eating due to hiatal  hernia and acid reflux. Discussed importance of increasing protein intake to prevent further muscle wasting and improve overall health. - Increase protein intake through diet and supplements - Recommend high-protein foods such as peanut butter, quinoa, beans, and protein shakes - Consider multivitamin supplementation  Hiatal Hernia and Gastroesophageal Reflux Disease (GERD) Experiences acid reflux and has a hiatal hernia, limiting food intake and contributing to nutritional deficiencies. Discussed benefits of small, frequent meals and continuing famotidine to manage symptoms. - Continue famotidine as needed for acid reflux - Encourage small, frequent meals to manage symptoms  Breast Lesion found on chest CT. Due for screening mammo in September 2024 Abnormal breast lesion, due for diagnostic mammogram. Discussed importance of follow-up imaging to rule out malignancy. - Order diagnostic mammogram - Schedule follow-up based on results  General Health Maintenance Folic acid level is low, likely due to nutritional deficiencies. Discussed benefits of multivitamin supplementation to address this deficiency. - Recommend over-the-counter multivitamin  Follow-up - Follow-up in six weeks to recheck thyroid levels and blood pressure - Reassess nutritional status and protein intake - Evaluate response to reduced levothyroxine dose.        Orders Placed This Encounter  Procedures   MM Digital Diagnostic Bilat   Flu vaccine trivalent PF, 6mos and older(Flulaval,Afluria,Fluarix,Fluzone)   Comprehensive metabolic panel   CBC with Differential/Platelet   Meds ordered this encounter  Medications   levothyroxine (SYNTHROID) 100 MCG tablet    Sig: Take 1 tablet (100 mcg total) by mouth daily.    Dispense:  90 tablet    Refill:  3   ELIQUIS 5 MG TABS tablet    Sig: Take 1 tablet (5 mg total) by mouth 2 (two) times daily.     I reviewed the patients updated PMH, FH, and SocHx.    Patient  Active Problem List   Diagnosis Date Noted   Hiatal hernia 06/24/2023    Priority: High   Class 2 severe obesity with body mass index (BMI) of 35 to 39.9 with serious comorbidity (HCC) 03/02/2020    Priority: High   Postablative hypothyroidism 02/03/2020    Priority: High   Systolic murmur 09/29/2019    Priority: High   GAD (generalized anxiety disorder) 06/24/2018    Priority: High   Mixed hyperlipidemia 06/24/2018    Priority: High   Essential hypertension 05/31/2018    Priority: High   Family history of premature CAD 12/29/2013    Priority: High   History of TIA (transient ischemic attack) 12/10/1980    Priority: High   Gastroesophageal reflux disease 10/11/2020    Priority: Medium    S/P gastric sleeve procedure 10/11/2020    Priority: Medium    Mild intermittent asthma without complication 06/24/2018    Priority: Medium    Primary osteoarthritis of both knees 09/11/2016    Priority: Medium    Gastric banding status 10/07/2015    Priority: Medium    Restless legs syndrome (RLS) 02/14/2015    Priority: Medium    Insomnia 12/29/2013    Priority: Medium    Status post right knee replacement 06/24/2023    Priority: Low   Status post left knee replacement 01/04/2023    Priority: Low   Acute  deep vein thrombosis (DVT) of right popliteal vein (HCC) 11/28/2023   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   albuterol (VENTOLIN HFA) 108 (90 Base) MCG/ACT inhaler Inhale 2 puffs into the lungs every 6 (six) hours as needed. For asthma   Blood Pressure Monitoring Soln KIT    calcium carbonate (TUMS - DOSED IN MG ELEMENTAL CALCIUM) 500 MG chewable  tablet Chew 3 tablets by mouth 3 (three) times daily as needed for indigestion or heartburn.   escitalopram (LEXAPRO) 10 MG tablet TAKE 1 TABLET BY MOUTH EVERY DAY   famotidine (PEPCID) 20 MG tablet Take 1 tablet (20 mg total) by mouth at bedtime.   levothyroxine (SYNTHROID) 100 MCG tablet Take 1 tablet (100 mcg total) by mouth daily.   ondansetron (ZOFRAN) 4 MG tablet Take 1 tablet (4 mg total) by mouth every 6 (six) hours as needed for nausea.   rOPINIRole (REQUIP) 3 MG tablet Take 1 tablet (3 mg total) by mouth at bedtime.   rosuvastatin (CRESTOR) 40 MG tablet Take 1 tablet (40 mg total) by mouth at bedtime.   [DISCONTINUED] ELIQUIS 5 MG TABS tablet Take 2 tablets (10 mg total) by mouth 2 (two) times daily for 7 days, THEN 1 tablet (5 mg total) 2 (two) times daily for 23 days. Take 10 mg BID for 7 days and then change.   [DISCONTINUED] hydrochlorothiazide (MICROZIDE) 12.5 MG capsule Take 1 capsule (12.5 mg total) by mouth daily. (Patient taking differently: Take 12.5 mg by mouth every other day.)   [DISCONTINUED] levothyroxine (SYNTHROID) 125 MCG tablet TAKE 1 TABLET BY MOUTH DAILY BEFORE BREAKFAST.   [DISCONTINUED] pantoprazole (PROTONIX) 40 MG tablet 1 tablet twice daily for 8 weeks (Patient taking differently: Take 40 mg by mouth daily. 1 tablet twice daily for 8 weeks)    Allergies: Patient is allergic to aspirin, cherry, other, nsaids, soy allergy (do not select), and penicillins. Family History: Patient family history includes Anesthesia problems in her sister; Breast cancer (age of onset: 36) in her maternal aunt; Cancer in her maternal aunt; Colon polyps in her mother; Glaucoma in her mother; Heart disease in her father; Hyperthyroidism in her mother; Migraines in her sister; Myasthenia gravis in her paternal grandfather; Ovarian cancer in her maternal grandmother; Stroke in her father; Uterine cancer in her maternal grandmother. Social History:  Patient  reports that she has never  smoked. She has never used smokeless tobacco. She reports that she does not currently use alcohol. She reports that she does not use drugs.  Review of Systems: Constitutional: Negative for fever malaise or anorexia Cardiovascular: negative for chest pain Respiratory: negative for SOB or persistent cough Gastrointestinal: negative for abdominal pain  Objective  Vitals: BP 132/88   Pulse 77   Temp 97.9 F (36.6 C)   Ht 5\' 2"  (1.575 m)   Wt 169 lb 6.4 oz (76.8 kg)   SpO2 98%   BMI 30.98 kg/m  General: no acute distress , A&Ox3 HEENT: PEERL, conjunctiva normal, neck is supple, temporal wasting present Cardiovascular:  RRR without murmur or gallop.  Respiratory:  Good breath sounds bilaterally, CTAB with normal respiratory effort Skin:  Warm, no rashes Right leg:  no cords, edema or ttp.   Commons side effects, risks, benefits, and alternatives for medications and treatment plan prescribed today were discussed, and the patient expressed understanding of the given instructions. Patient is instructed to call or message via MyChart if he/she has any questions or concerns regarding our treatment plan. No barriers to understanding were identified. We discussed Red Flag symptoms and signs in detail. Patient expressed understanding regarding what to do in case of urgent or emergency type symptoms.  Medication list was reconciled, printed and provided to the patient in AVS. Patient instructions and summary information was reviewed with the patient as documented in the AVS. This note was prepared with assistance of Dragon voice recognition software. Occasional wrong-word or sound-a-like substitutions may have occurred due to the inherent limitations of voice recognition software

## 2023-11-29 ENCOUNTER — Telehealth: Payer: Self-pay | Admitting: Family Medicine

## 2023-11-29 NOTE — Telephone Encounter (Signed)
Spoke with pt to let her know that Dr. Mardelle Matte is on vacation and will not return until 12/09/2023. Pt verbalized understanding

## 2023-11-29 NOTE — Telephone Encounter (Signed)
Patient dropped off document  Short Term Disability , to be filled out by provider. Patient requested to send it back via Call Patient to pick up within 5-days. Document is located in providers tray at front office.Please advise at Mobile [phone] (mobile)

## 2023-12-13 ENCOUNTER — Encounter: Payer: Self-pay | Admitting: Family Medicine

## 2023-12-15 ENCOUNTER — Other Ambulatory Visit: Payer: Self-pay | Admitting: Family Medicine

## 2023-12-18 ENCOUNTER — Other Ambulatory Visit: Payer: Self-pay | Admitting: Family Medicine

## 2023-12-19 ENCOUNTER — Encounter: Payer: Self-pay | Admitting: Family Medicine

## 2023-12-19 NOTE — Telephone Encounter (Signed)
 LVM for pt to cb to the office or send me a MyChart message on why she is needing Xanax again since she has been off of it for over a year.

## 2023-12-20 ENCOUNTER — Encounter: Payer: Self-pay | Admitting: Oncology

## 2023-12-20 ENCOUNTER — Inpatient Hospital Stay: Payer: No Typology Code available for payment source | Attending: Oncology | Admitting: Oncology

## 2023-12-20 ENCOUNTER — Inpatient Hospital Stay: Payer: No Typology Code available for payment source

## 2023-12-20 VITALS — HR 71 | Temp 97.8°F | Resp 17 | Wt 169.4 lb

## 2023-12-20 DIAGNOSIS — E039 Hypothyroidism, unspecified: Secondary | ICD-10-CM | POA: Diagnosis not present

## 2023-12-20 DIAGNOSIS — Z803 Family history of malignant neoplasm of breast: Secondary | ICD-10-CM | POA: Insufficient documentation

## 2023-12-20 DIAGNOSIS — Z823 Family history of stroke: Secondary | ICD-10-CM | POA: Insufficient documentation

## 2023-12-20 DIAGNOSIS — Z8673 Personal history of transient ischemic attack (TIA), and cerebral infarction without residual deficits: Secondary | ICD-10-CM | POA: Insufficient documentation

## 2023-12-20 DIAGNOSIS — Z79899 Other long term (current) drug therapy: Secondary | ICD-10-CM | POA: Diagnosis not present

## 2023-12-20 DIAGNOSIS — F419 Anxiety disorder, unspecified: Secondary | ICD-10-CM | POA: Diagnosis not present

## 2023-12-20 DIAGNOSIS — Z0000 Encounter for general adult medical examination without abnormal findings: Secondary | ICD-10-CM | POA: Insufficient documentation

## 2023-12-20 DIAGNOSIS — I7090 Unspecified atherosclerosis: Secondary | ICD-10-CM | POA: Insufficient documentation

## 2023-12-20 DIAGNOSIS — I2699 Other pulmonary embolism without acute cor pulmonale: Secondary | ICD-10-CM | POA: Diagnosis present

## 2023-12-20 DIAGNOSIS — Z809 Family history of malignant neoplasm, unspecified: Secondary | ICD-10-CM

## 2023-12-20 DIAGNOSIS — I82431 Acute embolism and thrombosis of right popliteal vein: Secondary | ICD-10-CM | POA: Diagnosis not present

## 2023-12-20 DIAGNOSIS — Z886 Allergy status to analgesic agent status: Secondary | ICD-10-CM | POA: Diagnosis not present

## 2023-12-20 DIAGNOSIS — K219 Gastro-esophageal reflux disease without esophagitis: Secondary | ICD-10-CM | POA: Diagnosis not present

## 2023-12-20 DIAGNOSIS — Z7901 Long term (current) use of anticoagulants: Secondary | ICD-10-CM | POA: Diagnosis not present

## 2023-12-20 DIAGNOSIS — N6310 Unspecified lump in the right breast, unspecified quadrant: Secondary | ICD-10-CM | POA: Diagnosis not present

## 2023-12-20 DIAGNOSIS — Z8249 Family history of ischemic heart disease and other diseases of the circulatory system: Secondary | ICD-10-CM | POA: Insufficient documentation

## 2023-12-20 DIAGNOSIS — I358 Other nonrheumatic aortic valve disorders: Secondary | ICD-10-CM | POA: Insufficient documentation

## 2023-12-20 DIAGNOSIS — Z83511 Family history of glaucoma: Secondary | ICD-10-CM | POA: Insufficient documentation

## 2023-12-20 DIAGNOSIS — E860 Dehydration: Secondary | ICD-10-CM | POA: Insufficient documentation

## 2023-12-20 DIAGNOSIS — Z8041 Family history of malignant neoplasm of ovary: Secondary | ICD-10-CM | POA: Insufficient documentation

## 2023-12-20 DIAGNOSIS — Z8049 Family history of malignant neoplasm of other genital organs: Secondary | ICD-10-CM

## 2023-12-20 DIAGNOSIS — I10 Essential (primary) hypertension: Secondary | ICD-10-CM | POA: Insufficient documentation

## 2023-12-20 DIAGNOSIS — E669 Obesity, unspecified: Secondary | ICD-10-CM | POA: Diagnosis not present

## 2023-12-20 DIAGNOSIS — E785 Hyperlipidemia, unspecified: Secondary | ICD-10-CM | POA: Insufficient documentation

## 2023-12-20 DIAGNOSIS — Z86711 Personal history of pulmonary embolism: Secondary | ICD-10-CM | POA: Insufficient documentation

## 2023-12-20 DIAGNOSIS — Z90710 Acquired absence of both cervix and uterus: Secondary | ICD-10-CM | POA: Diagnosis not present

## 2023-12-20 DIAGNOSIS — Z9884 Bariatric surgery status: Secondary | ICD-10-CM | POA: Diagnosis not present

## 2023-12-20 DIAGNOSIS — Z880 Allergy status to penicillin: Secondary | ICD-10-CM | POA: Insufficient documentation

## 2023-12-20 DIAGNOSIS — K449 Diaphragmatic hernia without obstruction or gangrene: Secondary | ICD-10-CM | POA: Diagnosis not present

## 2023-12-20 DIAGNOSIS — Z8349 Family history of other endocrine, nutritional and metabolic diseases: Secondary | ICD-10-CM | POA: Insufficient documentation

## 2023-12-20 DIAGNOSIS — Z885 Allergy status to narcotic agent status: Secondary | ICD-10-CM | POA: Insufficient documentation

## 2023-12-20 DIAGNOSIS — Z91018 Allergy to other foods: Secondary | ICD-10-CM | POA: Diagnosis not present

## 2023-12-20 DIAGNOSIS — I359 Nonrheumatic aortic valve disorder, unspecified: Secondary | ICD-10-CM | POA: Insufficient documentation

## 2023-12-20 DIAGNOSIS — Z83719 Family history of colon polyps, unspecified: Secondary | ICD-10-CM | POA: Insufficient documentation

## 2023-12-20 LAB — COMPREHENSIVE METABOLIC PANEL
ALT: 9 U/L (ref 0–44)
AST: 15 U/L (ref 15–41)
Albumin: 3.8 g/dL (ref 3.5–5.0)
Alkaline Phosphatase: 92 U/L (ref 38–126)
Anion gap: 6 (ref 5–15)
BUN: 7 mg/dL — ABNORMAL LOW (ref 8–23)
CO2: 28 mmol/L (ref 22–32)
Calcium: 9.8 mg/dL (ref 8.9–10.3)
Chloride: 104 mmol/L (ref 98–111)
Creatinine, Ser: 0.82 mg/dL (ref 0.44–1.00)
GFR, Estimated: >60 mL/min (ref >60)
Glucose, Bld: 72 mg/dL (ref 70–99)
Potassium: 3.4 mmol/L — ABNORMAL LOW (ref 3.5–5.1)
Sodium: 138 mmol/L (ref 135–145)
Total Bilirubin: 0.4 mg/dL (ref 0.0–1.2)
Total Protein: 6.7 g/dL (ref 6.5–8.1)

## 2023-12-20 LAB — CBC WITH DIFFERENTIAL/PLATELET
Abs Immature Granulocytes: 0.02 10*3/uL (ref 0.00–0.07)
Basophils Absolute: 0 10*3/uL (ref 0.0–0.1)
Basophils Relative: 1 %
Eosinophils Absolute: 0.2 10*3/uL (ref 0.0–0.5)
Eosinophils Relative: 4 %
HCT: 41.9 % (ref 36.0–46.0)
Hemoglobin: 14.2 g/dL (ref 12.0–15.0)
Immature Granulocytes: 0 %
Lymphocytes Relative: 29 %
Lymphs Abs: 1.6 10*3/uL (ref 0.7–4.0)
MCH: 29.9 pg (ref 26.0–34.0)
MCHC: 33.9 g/dL (ref 30.0–36.0)
MCV: 88.2 fL (ref 80.0–100.0)
Monocytes Absolute: 0.4 10*3/uL (ref 0.1–1.0)
Monocytes Relative: 7 %
Neutro Abs: 3.3 10*3/uL (ref 1.7–7.7)
Neutrophils Relative %: 59 %
Platelets: 185 10*3/uL (ref 150–400)
RBC: 4.75 MIL/uL (ref 3.87–5.11)
RDW: 13.3 % (ref 11.5–15.5)
WBC: 5.6 10*3/uL (ref 4.0–10.5)
nRBC: 0 % (ref 0.0–0.2)

## 2023-12-20 LAB — D-DIMER, QUANTITATIVE: D-Dimer, Quant: 0.41 ug{FEU}/mL (ref 0.00–0.50)

## 2023-12-20 MED ORDER — ELIQUIS 5 MG PO TABS: 5.0000 mg | ORAL_TABLET | Freq: Two times a day (BID) | ORAL | 4 refills | Status: DC

## 2023-12-20 NOTE — Progress Notes (Signed)
 [facility]  HEMATOLOGY CLINIC CONSULTATION NOTE   PATIENT NAME: Alison [name]   MR#: [id number] DOB: 08-29-1961  DATE OF SERVICE: 12/20/2023   [facility] follow up  Patient Care Team: Jodie Lavern CROME, MD as PCP - General (Family Medicine) Nahser, Aleene PARAS, MD as PCP - Cardiology (Cardiology)   REASON FOR CONSULTATION/ CHIEF COMPLAINT:  Evaluation for possible hypercoagulable state, with recent multiple pulmonary emboli diagnosed in December 2024.  ASSESSMENT & PLAN:  Alison [name] is a 63 y.o. lady with a past medical history of hypertension, hypothyroidism, dyslipidemia, was referred to our service for evaluation of possible hypercoagulable state, given her history of multiple pulmonary emboli diagnosed in December 2024.    Acute pulmonary embolism without acute cor pulmonale (HCC) -Currently on Eliquis  5 mg twice daily. No symptoms of pain or respiratory distress. Discussed importance of continuing anticoagulation therapy for at least six months to prevent recurrence and severe complications.   -Long road trips and dehydration may have contributed to clot formation. Emphasized adherence to medication, hydration, and taking breaks during long trips.  -Given the degree of thromboembolism, it would be prudent to rule out underlying hypercoagulable state.  Hence today we will check factor V Leiden mutation, prothrombin gene mutation, beta-2  glycoprotein antibodies, anticardiolipin antibodies, lupus anticoagulant.  Protein C activity, protein S activity, Antithrombin III  activity will be deferred, since they can be falsely abnormal given recent thromboembolism.  - Continue Eliquis  5 mg twice daily for six months  -D-dimer is normal at 0.41 today, indicating adequate anticoagulation  - Schedule follow-up phone call in two weeks to discuss lab results  - Schedule in-person follow-up in two months  - Consider repeat CT scan at the end of six  months if clinically indicated  Acute deep vein thrombosis (DVT) of right popliteal vein (HCC) DVT in the right lower leg confirmed via imaging. No symptoms such as swelling, redness, or pain. Prolonged immobility during a long drive (6 hours) may have contributed to clot formation.   Discussed importance of hydration and taking breaks during long trips to prevent recurrence.  -continue anticoagulation as above.   - Continue wearing compression socks - Monitor for any new symptoms of DVT  Mass of right breast Right breast lesion identified on CT scan. History of benign left breast lumpectomy in 2021. Delays in obtaining diagnostic imaging due to medical records transfer issues. No palpable mass detected. Discussed importance of timely diagnostic imaging to rule out malignancy. Patient plans to speak with a supervisor if delays continue. - Follow up with the breast center to expedite diagnostic imaging  Aortic valve calcification Aortic valve calcification confirmed on recent Echo. No symptoms such as chest pain or shortness of breath. Discussed potential need for valve replacement in the future as indicated by cardiology. - Monitor for any new symptoms - Continue regular follow-ups with cardiology  Healthcare maintenance Mammogram and other workup for breast lesion as mentioned above  Due for colonoscopy in 2026.    I reviewed lab results and outside records for this visit and discussed relevant results with the patient. Diagnosis, plan of care and treatment options were also discussed in detail with the patient. Opportunity provided to ask questions and answers provided to her apparent satisfaction. Provided instructions to call our clinic with any problems, questions or concerns prior to return visit. I recommended to continue follow-up with PCP and sub-specialists. She verbalized understanding and agreed with the plan. No barriers to learning  was detected.  Alison Patten, MD   12/20/2023 6:40 PM  [facility] - A DEPT OF [facility] [address] [place] [address] Dept: [phone] Dept Fax: [phone]   HISTORY OF PRESENT ILLNESS:  Discussed the use of AI scribe software for clinical note transcription with the patient, who gave verbal consent to proceed.   She presents for follow-up after a recent diagnosis of pulmonary emboli and deep vein thrombosis (DVT) in her right lower leg. The patient experienced severe pain in her side and difficulty breathing while on a trip to [facility], [place], which led to her hospitalization and diagnosis. She reports no current symptoms related to the DVT, including swelling, redness, or pain in the leg. She is currently on Eliquis  for anticoagulation and has been compliant with her medication.  In addition to her DVT and pulmonary emboli, the patient also mentions a right breast lesion detected on a CT scan during her hospitalization. She has been trying to get a diagnostic mammogram scheduled but has faced delays due to issues with medical records transfer. She has a history of a benign lumpectomy in her left breast two years ago.  The patient also has a history of knee replacements, with the most recent one done in March 2024. She reports no current issues with her knees. She also has a hiatal hernia, for which she takes Pepcid  and Tums, and is due for a follow-up endoscopy in February.  Of note, she drove from [place] all the way to [facility] [place] just prior to the diagnosis of DVT and pulmonary emboli, which is about 5 and half hours of drive, without taking any breaks.  MEDICAL HISTORY Past Medical History:  Diagnosis Date   Anxiety    Arthritis    LEFT KNEE   Asthma    Cervical myelopathy (HCC) 08/28/2018   GERD (gastroesophageal reflux disease)    Heart murmur    Hiatal hernia    Hx-TIA (transient ischemic attack) 1982   Hyperlipidemia     Hypertension    not on meds for 2 mos at preop on 02/13/23   Hypothyroidism    Obesity    Pneumonia    Polycystic ovarian syndrome      SURGICAL HISTORY Past Surgical History:  Procedure Laterality Date   ABDOMINAL HYSTERECTOMY  2009   Partial   ANTERIOR CERVICAL DECOMPRESSION/DISCECTOMY FUSION 4 LEVELS N/A 08/28/2018   Procedure: ANTERIOR CERVICAL DECOMPRESSION FUSION, CERVICAL 4-5, CERVICAL 5-6, CERVICAL 6-7 WITH INSTRUMENTATION AND ALLOGRAFT;  Surgeon: Beuford Anes, MD;  Location: [facility] OR;  Service: Orthopedics;  Laterality: N/A;   BREAST LUMPECTOMY Left 06/27/2020   Procedure: LEFT BREAST LUMPECTOMY;  Surgeon: Vernetta Berg, MD;  Location: [facility] OR;  Service: General;  Laterality: Left;  LMA   DIAGNOSTIC LAPAROSCOPY     FEMUR FRACTURE SURGERY  2008   Left   FRACTURE SURGERY     I & D EXTREMITY Left 06/01/2018   Procedure: IRRIGATION AND DEBRIDEMENT LEFT CALF;  Surgeon: Vernetta Lonni GRADE, MD;  Location: [facility] OR;  Service: Orthopedics;  Laterality: Left;   KNEE CARTILAGE SURGERY  2005   LAPAROSCOPIC GASTRIC BAND REMOVAL WITH LAPAROSCOPIC GASTRIC SLEEVE RESECTION N/A 05/03/2020   Procedure: LAPAROSCOPIC GASTRIC BAND REMOVAL WITH LAPAROSCOPIC GASTRIC SLEEVE RESECTION; UPPER ENDO AND ERAS PATHWAY;  Surgeon: Kinsinger, Herlene Righter, MD;  Location: [facility] ORS;  Service: General;  Laterality: N/A;   LAPAROSCOPIC GASTRIC BANDING  11/12/2011  Procedure: LAPAROSCOPIC GASTRIC BANDING;  Surgeon: Redell Alm Faith, DO;  Location: [facility] ORS;  Service: General;  Laterality: N/A;  nathanson liver retractor/ endostitch available 2-0 surgidac refills/ [place]ton needle drivers available/ [employer]er[facility] Doctor to proctor   TONSILLECTOMY  2007   TOTAL KNEE ARTHROPLASTY Left 10/31/2022   Procedure: TOTAL KNEE ARTHROPLASTY;  Surgeon: Edna Toribio LABOR, MD;  Location: [facility] ORS;  Service: Orthopedics;  Laterality: Left;   TOTAL KNEE ARTHROPLASTY Right 02/25/2023   Procedure: TOTAL KNEE ARTHROPLASTY;  Surgeon: Edna Toribio LABOR, MD;  Location: [facility];  Service: Orthopedics;  Laterality: Right;     SOCIAL HISTORY: She reports that she has never smoked. She has never used smokeless tobacco. She reports that she does not currently use alcohol . She reports that she does not use drugs. Social History   Socioeconomic History   Marital status: Single    Spouse name: Not on file   Number of children: Not on file   Years of education: Not on file   Highest education level: Not on file  Occupational History   Not on file  Tobacco Use   Smoking status: Never   Smokeless tobacco: Never  Vaping Use   Vaping status: Never Used  Substance and Sexual Activity   Alcohol  use: Not Currently    Comment: rare   Drug use: No   Sexual activity: Not Currently  Other Topics Concern   Not on file  Social History Narrative   Not on file   Social Drivers of Health   Financial Resource Strain: Not on file  Food Insecurity: No Food Insecurity (11/19/2023)   Hunger Vital Sign    Worried About Running Out of Food in the Last Year: Never true    Ran Out of Food in the Last Year: Never true  Transportation Needs: No Transportation Needs (11/19/2023)   PRAPARE - Administrator, Civil Service (Medical): No    Lack of Transportation (Non-Medical): No  Physical Activity: Not on file  Stress: Not on file  Social Connections: Not on file  Intimate Partner Violence: Not At Risk (11/19/2023)   Humiliation, Afraid, Rape, and Kick questionnaire    Fear of Current or Ex-Partner: No    Emotionally Abused: No    Physically Abused: No    Sexually Abused: No    FAMILY HISTORY: Her family history includes Anesthesia problems in her sister; Breast cancer (age of onset: 9) in her maternal aunt; Cancer in her maternal aunt; Colon polyps in her mother; Glaucoma in her mother; Heart disease in her father; Hyperthyroidism in her mother; Migraines in her sister; Myasthenia gravis in her paternal grandfather; Ovarian cancer  in her maternal grandmother; Stroke in her father; Uterine cancer in her maternal grandmother.  CURRENT MEDICATIONS   Current Outpatient Medications  Medication Instructions   acetaminophen  (TYLENOL ) 650 mg, Oral, Every 6 hours PRN   albuterol  (VENTOLIN  HFA) 108 (90 Base) MCG/ACT inhaler 2 puffs, Inhalation, Every 6 hours PRN, For asthma   ALPRAZolam  (XANAX ) 0.25 MG tablet TAKE 1 TABLET BY MOUTH AT BEDTIME AS NEEDED FOR ANXIETY OR SLEEP.   Blood Pressure Monitoring Soln KIT    calcium  carbonate (TUMS - DOSED IN MG ELEMENTAL CALCIUM ) 500 MG chewable tablet 3 tablets, 3 times daily PRN   Eliquis  5 mg, Oral, 2 times daily   escitalopram  (LEXAPRO ) 10 mg, Oral, Daily   famotidine  (PEPCID ) 20 mg, Oral, Daily at bedtime   levothyroxine  (SYNTHROID ) 100 mcg, Oral, Daily   ondansetron  (ZOFRAN )  4 mg, Oral, Every 6 hours PRN   pantoprazole  (PROTONIX ) 40 mg, Daily   rOPINIRole  (REQUIP ) 3 mg, Oral, Daily at bedtime   rosuvastatin  (CRESTOR ) 40 mg, Oral, Daily at bedtime     ALLERGIES  She is allergic to cherry, other, nsaids, oxycodone , soy allergy (do not select), and penicillins.  REVIEW OF SYSTEMS:  Review of Systems - Oncology   Rest of the pertinent review of systems is unremarkable except as mentioned above in HPI.  PHYSICAL EXAMINATION:  ECOG PERFORMANCE STATUS: 1 - Symptomatic but completely ambulatory  Vitals:   12/20/23 0907  Pulse: 71  Resp: 17  Temp: 97.8 F (36.6 C)  SpO2: 98%   Filed Weights   12/20/23 0907  Weight: 169 lb 6.4 oz (76.8 kg)    Physical Exam Constitutional:      General: She is not in acute distress.    Appearance: Normal appearance.  HENT:     Head: Normocephalic and atraumatic.  Eyes:     General: No scleral icterus.    Conjunctiva/sclera: Conjunctivae normal.  Cardiovascular:     Rate and Rhythm: Normal rate and regular rhythm.     Heart sounds: Normal heart sounds.  Pulmonary:     Effort: Pulmonary effort is normal.     Breath sounds:  Normal breath sounds.  Abdominal:     General: There is no distension.  Musculoskeletal:     Right lower leg: No edema.     Left lower leg: No edema.  Neurological:     General: No focal deficit present.     Mental Status: She is alert and oriented to person, place, and time.  Psychiatric:        Mood and Affect: Mood normal.        Behavior: Behavior normal.        Thought Content: Thought content normal.     LABORATORY DATA:   I have reviewed the data as listed.  Results for orders placed or performed in visit on 12/20/23  D-dimer, quantitative  Result Value Ref Range   D-Dimer, Quant 0.41 0.00 - 0.50 ug/mL-FEU  Comprehensive metabolic panel  Result Value Ref Range   Sodium 138 135 - 145 mmol/L   Potassium 3.4 (L) 3.5 - 5.1 mmol/L   Chloride 104 98 - 111 mmol/L   CO2 28 22 - 32 mmol/L   Glucose, Bld 72 70 - 99 mg/dL   BUN 7 (L) 8 - 23 mg/dL   Creatinine, Ser 9.17 0.44 - 1.00 mg/dL   Calcium  9.8 8.9 - 10.3 mg/dL   Total Protein 6.7 6.5 - 8.1 g/dL   Albumin 3.8 3.5 - 5.0 g/dL   AST 15 15 - 41 U/L   ALT 9 0 - 44 U/L   Alkaline Phosphatase 92 38 - 126 U/L   Total Bilirubin 0.4 0.0 - 1.2 mg/dL   GFR, Estimated >39 >39 mL/min   Anion gap 6 5 - 15  CBC with Differential/Platelet  Result Value Ref Range   WBC 5.6 4.0 - 10.5 K/uL   RBC 4.75 3.87 - 5.11 MIL/uL   Hemoglobin 14.2 12.0 - 15.0 g/dL   HCT 58.0 63.9 - 53.9 %   MCV 88.2 80.0 - 100.0 fL   MCH 29.9 26.0 - 34.0 pg   MCHC 33.9 30.0 - 36.0 g/dL   RDW 86.6 88.4 - 84.4 %   Platelets 185 150 - 400 K/uL   nRBC 0.0 0.0 - 0.2 %   Neutrophils Relative %  59 %   Neutro Abs 3.3 1.7 - 7.7 K/uL   Lymphocytes Relative 29 %   Lymphs Abs 1.6 0.7 - 4.0 K/uL   Monocytes Relative 7 %   Monocytes Absolute 0.4 0.1 - 1.0 K/uL   Eosinophils Relative 4 %   Eosinophils Absolute 0.2 0.0 - 0.5 K/uL   Basophils Relative 1 %   Basophils Absolute 0.0 0.0 - 0.1 K/uL   Immature Granulocytes 0 %   Abs Immature Granulocytes 0.02 0.00 -  0.07 K/uL    RADIOGRAPHIC STUDIES:  I have personally reviewed the radiological images as listed and agreed with the findings in the report.  DG CHEST PORT 1 VIEW Result Date: 11/21/2023 CLINICAL DATA:  [id number] SOB (shortness of breath) [id number] EXAM: PORTABLE CHEST 1 VIEW COMPARISON:  January 20, 2023 FINDINGS: The cardiomediastinal silhouette is unchanged and enlarged in contour.Atherosclerotic calcifications. No pneumothorax. Persistent heterogeneous opacification of the RIGHT lower lung base likely reflecting a combination of airspace opacity and pleural fluid favored trace LEFT pleural effusion. IMPRESSION: Persistent heterogeneous opacification of the RIGHT lower lung base likely reflecting a combination of airspace opacity and pleural fluid. Given reported history of pulmonary embolism, this could reflect pulmonary infarct or infection. Electronically Signed   By: Corean Salter M.D.   On: 11/21/2023 08:00     Orders Placed This Encounter  Procedures   CBC with Differential/Platelet    Standing Status:   Future    Number of Occurrences:   1    Expiration Date:   12/19/2024   Comprehensive metabolic panel    Standing Status:   Future    Number of Occurrences:   1    Expiration Date:   12/19/2024   Prothrombin gene mutation    Standing Status:   Future    Number of Occurrences:   1    Expiration Date:   12/19/2024   Factor 5 leiden    Standing Status:   Future    Number of Occurrences:   1    Expiration Date:   12/19/2024   Cardiolipin antibodies, IgG, IgM, IgA    Standing Status:   Future    Number of Occurrences:   1    Expiration Date:   12/19/2024   Beta-2 -glycoprotein i abs, IgG/M/A    Standing Status:   Future    Number of Occurrences:   1    Expiration Date:   12/19/2024   D-dimer, quantitative    Standing Status:   Future    Number of Occurrences:   1    Expiration Date:   12/19/2024   Lupus anticoagulant panel    Standing Status:   Future    Number of  Occurrences:   1    Expiration Date:   12/19/2024    Future Appointments  Date Time Provider Department Center  12/23/2023 10:10 AM [facility] DIAG TOMO 2 [facility]  12/23/2023 10:20 AM [facility] US  2 [facility]  01/03/2024  3:40 PM Josefita Weissmann, Chinita, MD [facility] None  01/07/2024 11:30 AM Jodie Lavern CROME, MD [facility]  02/14/2024 10:45 AM [facility] LAB [facility] None  02/14/2024 11:20 AM Rose-Marie Hickling, Chinita, MD [facility] None  07/07/2024  9:30 AM Jodie Lavern CROME, MD [facility]    I spent a total of 55 minutes during this encounter with the patient including review of chart and various tests results, discussions about plan of care and coordination of care plan.  This document was completed utilizing speech recognition software. Grammatical  errors, random word insertions, pronoun errors, and incomplete sentences are an occasional consequence of this system due to software limitations, ambient noise, and hardware issues. Any formal questions or concerns about the content, text or information contained within the body of this dictation should be directly addressed to the provider for clarification.

## 2023-12-20 NOTE — Assessment & Plan Note (Signed)
 DVT in the right lower leg confirmed via imaging. No symptoms such as swelling, redness, or pain. Prolonged immobility during a long drive (6 hours) may have contributed to clot formation.   Discussed importance of hydration and taking breaks during long trips to prevent recurrence.  -continue anticoagulation as above.   - Continue wearing compression socks - Monitor for any new symptoms of DVT

## 2023-12-20 NOTE — Assessment & Plan Note (Signed)
 Aortic valve calcification confirmed on recent Echo. No symptoms such as chest pain or shortness of breath. Discussed potential need for valve replacement in the future as indicated by cardiology. - Monitor for any new symptoms - Continue regular follow-ups with cardiology

## 2023-12-20 NOTE — Assessment & Plan Note (Signed)
-  Currently on Eliquis  5 mg twice daily. No symptoms of pain or respiratory distress. Discussed importance of continuing anticoagulation therapy for at least six months to prevent recurrence and severe complications.   -Long road trips and dehydration may have contributed to clot formation. Emphasized adherence to medication, hydration, and taking breaks during long trips.  -Given the degree of thromboembolism, it would be prudent to rule out underlying hypercoagulable state.  Hence today we will check factor V Leiden mutation, prothrombin gene mutation, beta-2  glycoprotein antibodies, anticardiolipin antibodies, lupus anticoagulant.  Protein C activity, protein S activity, Antithrombin III  activity will be deferred, since they can be falsely abnormal given recent thromboembolism.  - Continue Eliquis  5 mg twice daily for six months  -D-dimer is normal at 0.41 today, indicating adequate anticoagulation  - Schedule follow-up phone call in two weeks to discuss lab results  - Schedule in-person follow-up in two months  - Consider repeat CT scan at the end of six months if clinically indicated

## 2023-12-20 NOTE — Assessment & Plan Note (Signed)
 Right breast lesion identified on CT scan. History of benign left breast lumpectomy in 2021. Delays in obtaining diagnostic imaging due to medical records transfer issues. No palpable mass detected. Discussed importance of timely diagnostic imaging to rule out malignancy. Patient plans to speak with a supervisor if delays continue. - Follow up with the breast center to expedite diagnostic imaging

## 2023-12-20 NOTE — Assessment & Plan Note (Signed)
 Mammogram and other workup for breast lesion as mentioned above  Due for colonoscopy in 2026.

## 2023-12-22 LAB — CARDIOLIPIN ANTIBODIES, IGG, IGM, IGA
Anticardiolipin IgA: <9 [APL'U]/mL (ref 0–11)
Anticardiolipin IgG: <9 [GPL'U]/mL (ref 0–14)
Anticardiolipin IgM: <9 [MPL'U]/mL (ref 0–12)

## 2023-12-22 LAB — BETA-2-GLYCOPROTEIN I ABS, IGG/M/A
Beta-2 Glyco I IgG: <9 GPI IgG units (ref 0–20)
Beta-2-Glycoprotein I IgA: 21 GPI IgA units (ref 0–25)
Beta-2-Glycoprotein I IgM: <9 GPI IgM units (ref 0–32)

## 2023-12-23 ENCOUNTER — Ambulatory Visit: Payer: No Typology Code available for payment source

## 2023-12-23 ENCOUNTER — Ambulatory Visit
Admission: RE | Admit: 2023-12-23 | Discharge: 2023-12-23 | Disposition: A | Discharge status: Home / Self Care | Payer: No Typology Code available for payment source | Source: Ambulatory Visit | Attending: Family Medicine | Admitting: Family Medicine

## 2023-12-23 DIAGNOSIS — N63 Unspecified lump in unspecified breast: Secondary | ICD-10-CM

## 2023-12-23 DIAGNOSIS — N6311 Unspecified lump in the right breast, upper outer quadrant: Secondary | ICD-10-CM

## 2023-12-23 DIAGNOSIS — D242 Benign neoplasm of left breast: Secondary | ICD-10-CM

## 2023-12-24 LAB — LUPUS ANTICOAGULANT PANEL
DRVVT: 71 s — ABNORMAL HIGH (ref 0.0–47.0)
PTT Lupus Anticoagulant: 50.8 s — ABNORMAL HIGH (ref 0.0–43.5)

## 2023-12-24 LAB — DRVVT CONFIRM: dRVVT Confirm: 1 {ratio} (ref 0.8–1.2)

## 2023-12-24 LAB — PTT-LA MIX: PTT-LA Mix: 44.1 s — ABNORMAL HIGH (ref 0.0–40.5)

## 2023-12-24 LAB — DRVVT MIX: dRVVT Mix: 52.5 s — ABNORMAL HIGH (ref 0.0–40.4)

## 2023-12-24 LAB — HEXAGONAL PHASE PHOSPHOLIPID: Hexagonal Phase Phospholipid: 11 s (ref 0–11)

## 2023-12-25 LAB — FACTOR 5 LEIDEN

## 2023-12-26 LAB — PROTHROMBIN GENE MUTATION

## 2023-12-27 ENCOUNTER — Other Ambulatory Visit: Payer: Self-pay | Admitting: Internal Medicine

## 2023-12-27 DIAGNOSIS — K219 Gastro-esophageal reflux disease without esophagitis: Secondary | ICD-10-CM

## 2024-01-03 ENCOUNTER — Inpatient Hospital Stay (HOSPITAL_BASED_OUTPATIENT_CLINIC_OR_DEPARTMENT_OTHER): Payer: No Typology Code available for payment source | Admitting: Oncology

## 2024-01-03 ENCOUNTER — Encounter: Payer: Self-pay | Admitting: Oncology

## 2024-01-03 DIAGNOSIS — I82431 Acute embolism and thrombosis of right popliteal vein: Secondary | ICD-10-CM

## 2024-01-03 DIAGNOSIS — N6310 Unspecified lump in the right breast, unspecified quadrant: Secondary | ICD-10-CM | POA: Diagnosis not present

## 2024-01-03 DIAGNOSIS — I2699 Other pulmonary embolism without acute cor pulmonale: Secondary | ICD-10-CM

## 2024-01-03 NOTE — Progress Notes (Signed)
[facility]  HEMATOLOGY-ONCOLOGY ELECTRONIC VISIT PROGRESS NOTE  PATIENT NAME: Alison [name]   MR#: [id number] DOB: [birth year]  DATE OF SERVICE: 01/03/2024  Patient Care Team: Willow Ora, MD as PCP - General (Family Medicine) Nahser, Deloris Ping, MD as PCP - Cardiology (Cardiology) Meryl Crutch, MD as Consulting Physician (Oncology)  I connected with the patient via telephone conference and verified that I am speaking with the correct person using two identifiers. The patient's location is at home and I am providing care from the [facility].  I discussed the limitations, risks, security and privacy concerns of performing an evaluation and management service by e-visits and the availability of in person appointments.  I also discussed with the patient that there may be a patient responsible charge related to this service. The patient expressed understanding and agreed to proceed.   ASSESSMENT & PLAN:   Alison [name] is a 63 y.o. lady with a past medical history of hypertension, hypothyroidism, dyslipidemia, was referred to our service for evaluation of possible hypercoagulable state, given her history of multiple pulmonary emboli diagnosed in December 2024.    Acute pulmonary embolism without acute cor pulmonale (HCC) -Currently on Eliquis 5 mg twice daily. No symptoms of pain or respiratory distress. Discussed importance of continuing anticoagulation therapy for at least six months to prevent recurrence and severe complications.   -Long road trips and dehydration may have contributed to clot formation. Emphasized adherence to medication, hydration, and taking breaks during long trips.  Long road trip and dehydration may have contributed to clot formation. Emphasized adherence to medication, hydration, and taking breaks during long trips. Continue wearing compression socks.   Given the degree of thromboembolism, it would be prudent to rule out underlying  hypercoagulable state.  Hence we pursued hypercoagulable workup on 12/20/2023 during her initial consultation with [place].  It was grossly unremarkable without evidence of factor V Leiden mutation, prothrombin gene mutation, anticardiolipin antibodies, beta-2 glycoprotein antibodies or lupus anticoagulant.    Protein C activity, protein S activity, Antithrombin III activity were deferred, since they can be falsely abnormal given recent thromboembolism.  We will obtain this on return visit.   Continue Eliquis 5 mg twice daily for six months   D-dimer was normal at 0.41 on 12/20/23, indicating adequate anticoagulation   Consider repeat CT scan at the end of six months if clinically indicated  RTC in June 2025.  Acute deep vein thrombosis (DVT) of right popliteal vein (HCC) DVT in the right lower leg confirmed via imaging. No symptoms such as swelling, redness, or pain. Prolonged immobility during a long drive (6 hours) may have contributed to clot formation.   Discussed importance of hydration and taking breaks during long trips to prevent recurrence.  -continue anticoagulation as above.   - Continue wearing compression socks - Monitor for any new symptoms of DVT  Mass of right breast Right breast lesion identified on CT scan in December 2024. History of benign left breast lumpectomy in 2021. Delays in obtaining diagnostic imaging due to medical records transfer issues. No palpable mass detected.   She eventually had diagnostic bilateral mammogram on 12/23/2023.  It showed benign, stable mass in the upper outer right breast, corresponding to abnormality noted on her recent chest CT.  This corresponds to a group of cysts noted on diagnostic imaging and ultrasound on 04/15/2020.  BI-RADS 2, benign finding.   I discussed the assessment and treatment plan with the patient. The patient was provided an  opportunity to ask questions and all were answered. The patient agreed with the plan and demonstrated an  understanding of the instructions. The patient was advised to call back or seek an in-person evaluation if the symptoms worsen or if the condition fails to improve as anticipated.    I spent 12 minutes over the phone with the patient reviewing test results, discuss management and coordination/planning of care.  Meryl Crutch, MD 01/03/2024 3:55 PM [facility] - A DEPT OF [facility] [address] [place] [address] Dept: [phone] Dept Fax: [phone]   INTERVAL HISTORY:  Please see above for problem oriented charting.  The purpose of today's discussion is to explain recent lab results and to formulate plan of care.  She reports feeling great with no current symptoms. She had a recent mammogram and blood work done, both of which came back normal. The patient is currently on Eliquis for the blood clots and reports no chest pain or trouble breathing. She also mentions a lack of symptoms in the leg where the clot was located.  SUMMARY OF HEMATOLOGY HISTORY:  She presented to our clinic after a diagnosis of pulmonary emboli and deep vein thrombosis (DVT) in her right lower leg, diagnosed in December 2024. The patient experienced severe pain in her side and difficulty breathing while on a trip to [facility] [place], which led to her hospitalization and diagnosis. She reports no current symptoms related to the DVT, including swelling, redness, or pain in the leg. She is currently on Eliquis for anticoagulation and has been compliant with her medication.   In addition to her DVT and pulmonary emboli, the patient also mentions a right breast lesion detected on a CT scan during her hospitalization. She has been trying to get a diagnostic mammogram scheduled but has faced delays due to issues with medical records transfer. She has a history of a benign lumpectomy in her left breast two years ago.   The patient also has a history  of knee replacements, with the most recent one done in March 2024. She reports no current issues with her knees. She also has a hiatal hernia, for which she takes Pepcid and Tums, and is due for a follow-up endoscopy in February.   Of note, she drove from [place] all the way to [facility] [place] just prior to the diagnosis of DVT and pulmonary emboli, which is about 5 and half hours of drive, without taking any breaks.  Long road trip and dehydration may have contributed to clot formation. Emphasized adherence to medication, hydration, and taking breaks during long trips. Continue wearing compression socks.   Given the degree of thromboembolism, it would be prudent to rule out underlying hypercoagulable state.  Hence we pursued hypercoagulable workup on 12/20/2023 during her initial consultation with [place].  It was grossly unremarkable without evidence of factor V Leiden mutation, prothrombin gene mutation, anticardiolipin antibodies, beta-2 glycoprotein antibodies or lupus anticoagulant.    Protein C activity, protein S activity, Antithrombin III activity were deferred, since they can be falsely abnormal given recent thromboembolism.   Continue Eliquis 5 mg twice daily for six months   D-dimer was normal at 0.41 on 12/20/23, indicating adequate anticoagulation   Consider repeat CT scan at the end of six months if clinically indicated   Mass of right breast Right breast lesion identified on CT scan in December 2024. History of benign left breast lumpectomy in 2021. Delays  in obtaining diagnostic imaging due to medical records transfer issues. No palpable mass detected.   She eventually had diagnostic bilateral mammogram on 12/23/2023.  It showed benign, stable mass in the upper outer right breast, corresponding to abnormality noted on her recent chest CT.  This corresponds to a group of cysts noted on diagnostic imaging and ultrasound on 04/15/2020.  BI-RADS 2, benign finding.  REVIEW OF SYSTEMS:     Review of Systems - Oncology  All other pertinent systems were reviewed with the patient and are negative.  I have reviewed the past medical history, past surgical history, social history and family history with the patient and they are unchanged from previous note.  ALLERGIES:  She is allergic to cherry, other, nsaids, oxycodone, soy allergy (do not select), and penicillins.  MEDICATIONS:  Current Outpatient Medications  Medication Sig Dispense Refill   acetaminophen (TYLENOL) 325 MG tablet Take 2 tablets (650 mg total) by mouth every 6 (six) hours as needed for mild pain (pain score 1-3) (or Fever >/= 101). 20 tablet 0   albuterol (VENTOLIN HFA) 108 (90 Base) MCG/ACT inhaler Inhale 2 puffs into the lungs every 6 (six) hours as needed. For asthma 18 g 2   ALPRAZolam (XANAX) 0.25 MG tablet TAKE 1 TABLET BY MOUTH AT BEDTIME AS NEEDED FOR ANXIETY OR SLEEP. 20 tablet 0   Blood Pressure Monitoring Soln KIT      calcium carbonate (TUMS - DOSED IN MG ELEMENTAL CALCIUM) 500 MG chewable tablet Chew 3 tablets by mouth 3 (three) times daily as needed for indigestion or heartburn.     ELIQUIS 5 MG TABS tablet Take 1 tablet (5 mg total) by mouth 2 (two) times daily. 60 tablet 4   escitalopram (LEXAPRO) 10 MG tablet TAKE 1 TABLET BY MOUTH EVERY DAY 90 tablet 3   famotidine (PEPCID) 20 MG tablet Take 1 tablet (20 mg total) by mouth at bedtime. 90 tablet 3   levothyroxine (SYNTHROID) 100 MCG tablet Take 1 tablet (100 mcg total) by mouth daily. 90 tablet 3   ondansetron (ZOFRAN) 4 MG tablet Take 1 tablet (4 mg total) by mouth every 6 (six) hours as needed for nausea. (Patient not taking: Reported on 12/20/2023) 20 tablet 0   pantoprazole (PROTONIX) 40 MG tablet Take 1 tablet (40 mg total) by mouth 2 (two) times daily. 180 tablet 1   rOPINIRole (REQUIP) 3 MG tablet Take 1 tablet (3 mg total) by mouth at bedtime. 90 tablet 3   rosuvastatin (CRESTOR) 40 MG tablet Take 1 tablet (40 mg total) by mouth at  bedtime. 90 tablet 3   No current facility-administered medications for this visit.    PHYSICAL EXAMINATION: ECOG PERFORMANCE STATUS: 0 - Asymptomatic  LABORATORY DATA:   I have reviewed the data as listed.  Recent Results (from the past 2160 hours)  CBC     Status: None   Collection Time: 11/18/23  7:41 PM  Result Value Ref Range   WBC 10.4 4.0 - 10.5 K/uL   RBC 4.76 3.87 - 5.11 MIL/uL   Hemoglobin 14.1 12.0 - 15.0 g/dL   HCT 86.5 78.4 - 69.6 %   MCV 91.2 80.0 - 100.0 fL   MCH 29.6 26.0 - 34.0 pg   MCHC 32.5 30.0 - 36.0 g/dL   RDW 29.5 28.4 - 13.2 %   Platelets 201 150 - 400 K/uL   nRBC 0.0 0.0 - 0.2 %    Comment: Performed at [facility], [address] [place], [place] [address]  Comprehensive metabolic panel     Status: Abnormal   Collection Time: 11/18/23  7:41 PM  Result Value Ref Range   Sodium 138 135 - 145 mmol/L   Potassium 4.0 3.5 - 5.1 mmol/L   Chloride 105 98 - 111 mmol/L   CO2 24 22 - 32 mmol/L   Glucose, Bld 87 70 - 99 mg/dL    Comment: Glucose reference range applies only to samples taken after fasting for at least 8 hours.   BUN 11 8 - 23 mg/dL   Creatinine, Ser 6.57 0.44 - 1.00 mg/dL   Calcium 9.9 8.9 - 84.6 mg/dL   Total Protein 7.6 6.5 - 8.1 g/dL   Albumin 4.0 3.5 - 5.0 g/dL   AST 14 (L) 15 - 41 U/L   ALT 12 0 - 44 U/L   Alkaline Phosphatase 78 38 - 126 U/L   Total Bilirubin 0.9 <1.2 mg/dL   GFR, Estimated >96 >29 mL/min    Comment: (NOTE) Calculated using the CKD-EPI Creatinine Equation (2021)    Anion gap 9 5 - 15    Comment: Performed at [facility], [address] [place], [place] [address]  D-dimer, quantitative     Status: Abnormal   Collection Time: 11/18/23  7:41 PM  Result Value Ref Range   D-Dimer, Quant 5.86 (H) 0.00 - 0.50 ug/mL-FEU    Comment: (NOTE) At the manufacturer cut-off value of 0.5 g/mL FEU, this assay has a negative predictive value of 95-100%.This assay is intended for  use in conjunction with a clinical pretest probability (PTP) assessment model to exclude pulmonary embolism (PE) and deep venous thrombosis (DVT) in outpatients suspected of PE or DVT. Results should be correlated with clinical presentation. Performed at [facility], [address] [place], [place] [address]   Troponin I (High Sensitivity)     Status: None   Collection Time: 11/18/23  9:27 PM  Result Value Ref Range   Troponin I (High Sensitivity) 3 <18 ng/L    Comment: (NOTE) Elevated high sensitivity troponin I (hsTnI) values and significant  changes across serial measurements may suggest ACS but many other  chronic and acute conditions are known to elevate hsTnI results.  Refer to the "Links" section for chest pain algorithms and additional  guidance. Performed at [facility], [address] [place], [place] [address]   Troponin I (High Sensitivity)     Status: None   Collection Time: 11/18/23 11:45 PM  Result Value Ref Range   Troponin I (High Sensitivity) 3 <18 ng/L    Comment: (NOTE) Elevated high sensitivity troponin I (hsTnI) values and significant  changes across serial measurements may suggest ACS but many other  chronic and acute conditions are known to elevate hsTnI results.  Refer to the "Links" section for chest pain algorithms and additional  guidance. Performed at [facility], [address], [place], [place] [address]   HIV Antibody (routine testing w rflx)     Status: None   Collection Time: 11/19/23  5:45 AM  Result Value Ref Range   HIV Screen 4th Generation wRfx Non Reactive Non Reactive    Comment: Performed at [facility], [address] [place], [place] [address]  CBC     Status: None   Collection Time: 11/19/23  5:45 AM  Result Value Ref Range   WBC 9.8 4.0 - 10.5 K/uL   RBC 4.21 3.87 - 5.11 MIL/uL  Hemoglobin 12.6 12.0 - 15.0 g/dL   HCT 40.9 81.1 - 91.4 %   MCV 91.0 80.0 -  100.0 fL   MCH 29.9 26.0 - 34.0 pg   MCHC 32.9 30.0 - 36.0 g/dL   RDW 78.2 95.6 - 21.3 %   Platelets 195 150 - 400 K/uL   nRBC 0.0 0.0 - 0.2 %    Comment: Performed at [facility], [address], [place], [place] [address]  Basic metabolic panel     Status: None   Collection Time: 11/19/23  5:45 AM  Result Value Ref Range   Sodium 137 135 - 145 mmol/L   Potassium 4.1 3.5 - 5.1 mmol/L   Chloride 104 98 - 111 mmol/L   CO2 24 22 - 32 mmol/L   Glucose, Bld 93 70 - 99 mg/dL    Comment: Glucose reference range applies only to samples taken after fasting for at least 8 hours.   BUN 15 8 - 23 mg/dL   Creatinine, Ser 8.46 0.44 - 1.00 mg/dL   Calcium 9.2 8.9 - 96.2 mg/dL   GFR, Estimated >95 >28 mL/min    Comment: (NOTE) Calculated using the CKD-EPI Creatinine Equation (2021)    Anion gap 9 5 - 15    Comment: Performed at [facility], [address], [place], [place] [address]  TSH     Status: Abnormal   Collection Time: 11/19/23  5:45 AM  Result Value Ref Range   TSH 0.020 (L) 0.350 - 4.500 uIU/mL    Comment: Performed by a 3rd Generation assay with a functional sensitivity of <=0.01 uIU/mL. Performed at [facility], [address] [place], [place] [address]   ECHOCARDIOGRAM COMPLETE     Status: None   Collection Time: 11/19/23  2:29 PM  Result Value Ref Range   Weight 2,768 oz   Height 62 in   BP 93/60 mmHg   S' Lateral 2.50 cm   AR max vel 1.55 cm2   AV Area VTI 1.38 cm2   AV Mean grad 14.0 mmHg   AV Peak grad 22.9 mmHg   Ao pk vel 2.40 m/s   Area-P 1/2 3.99 cm2   AV Area mean vel 1.38 cm2   Est EF 60 - 65%   Urinalysis, Routine w reflex microscopic -Urine, Clean Catch     Status: Abnormal   Collection Time: 11/19/23  5:39 PM  Result Value Ref Range   Color, Urine YELLOW YELLOW   APPearance CLEAR CLEAR   Specific Gravity, Urine 1.032 (H) 1.005 - 1.030   pH 5.0 5.0 - 8.0   Glucose, UA NEGATIVE NEGATIVE mg/dL    Hgb urine dipstick NEGATIVE NEGATIVE   Bilirubin Urine NEGATIVE NEGATIVE   Ketones, ur NEGATIVE NEGATIVE mg/dL   Protein, ur NEGATIVE NEGATIVE mg/dL   Nitrite NEGATIVE NEGATIVE   Leukocytes,Ua NEGATIVE NEGATIVE    Comment: Performed at [facility], [address] [place], [place] [address]  Culture, blood (Routine X 2) w Reflex to ID Panel     Status: None   Collection Time: 11/20/23  9:22 AM   Specimen: BLOOD RIGHT ARM  Result Value Ref Range   Specimen Description      BLOOD RIGHT ARM Performed at [facility], [address] [place], [place] [address]    Special Requests      BOTTLES DRAWN AEROBIC AND ANAEROBIC Blood Culture results may not be optimal due to an inadequate volume  of blood received in culture bottles Performed at [facility], [address], [place], [place] [address]    Culture      NO GROWTH 5 DAYS Performed at [facility], [address], [place], [place] [address]    Report Status 11/25/2023 FINAL   CBC with Differential/Platelet     Status: None   Collection Time: 11/20/23  9:32 AM  Result Value Ref Range   WBC 6.9 4.0 - 10.5 K/uL   RBC 3.88 3.87 - 5.11 MIL/uL   Hemoglobin 12.0 12.0 - 15.0 g/dL   HCT 69.6 29.5 - 28.4 %   MCV 92.8 80.0 - 100.0 fL   MCH 30.9 26.0 - 34.0 pg   MCHC 33.3 30.0 - 36.0 g/dL   RDW 13.2 44.0 - 10.2 %   Platelets 193 150 - 400 K/uL   nRBC 0.0 0.0 - 0.2 %   Neutrophils Relative % 65 %   Neutro Abs 4.5 1.7 - 7.7 K/uL   Lymphocytes Relative 26 %   Lymphs Abs 1.8 0.7 - 4.0 K/uL   Monocytes Relative 6 %   Monocytes Absolute 0.4 0.1 - 1.0 K/uL   Eosinophils Relative 3 %   Eosinophils Absolute 0.2 0.0 - 0.5 K/uL   Basophils Relative 0 %   Basophils Absolute 0.0 0.0 - 0.1 K/uL   Immature Granulocytes 0 %   Abs Immature Granulocytes 0.02 0.00 - 0.07 K/uL    Comment: Performed at [facility], [address] [place], [place] [address]  Comprehensive  metabolic panel     Status: Abnormal   Collection Time: 11/20/23  9:32 AM  Result Value Ref Range   Sodium 135 135 - 145 mmol/L   Potassium 3.5 3.5 - 5.1 mmol/L   Chloride 106 98 - 111 mmol/L   CO2 22 22 - 32 mmol/L   Glucose, Bld 143 (H) 70 - 99 mg/dL    Comment: Glucose reference range applies only to samples taken after fasting for at least 8 hours.   BUN 13 8 - 23 mg/dL   Creatinine, Ser 6.44 0.44 - 1.00 mg/dL   Calcium 9.0 8.9 - 03.4 mg/dL   Total Protein 5.8 (L) 6.5 - 8.1 g/dL   Albumin 2.7 (L) 3.5 - 5.0 g/dL   AST 11 (L) 15 - 41 U/L   ALT 8 0 - 44 U/L   Alkaline Phosphatase 61 38 - 126 U/L   Total Bilirubin 0.5 <1.2 mg/dL   GFR, Estimated >74 >25 mL/min    Comment: (NOTE) Calculated using the CKD-EPI Creatinine Equation (2021)    Anion gap 7 5 - 15    Comment: Performed at [facility], [address] [place], [place] [address]  Magnesium     Status: None   Collection Time: 11/20/23  9:32 AM  Result Value Ref Range   Magnesium 2.0 1.7 - 2.4 mg/dL    Comment: Performed at [facility], [address], [place], [place] [address]  Phosphorus     Status: None   Collection Time: 11/20/23  9:32 AM  Result Value Ref Range   Phosphorus 2.6 2.5 - 4.6 mg/dL    Comment: Performed at [facility], [address] [place], [place] [address]  Culture, blood (Routine X 2) w Reflex to ID Panel     Status: None   Collection Time: 11/20/23  9:32 AM   Specimen: BLOOD LEFT HAND  Result Value Ref Range  Specimen Description      BLOOD LEFT HAND Performed at [facility], [address] [place], [place] [address]    Special Requests      BOTTLES DRAWN AEROBIC ONLY Blood Culture results may not be optimal due to an inadequate volume of blood received in culture bottles Performed at [facility], [address], [place], [place] [address]    Culture      NO GROWTH 5 DAYS Performed at [facility], [address], [place], [place] [address]    Report Status 11/25/2023 FINAL   Comprehensive metabolic panel     Status: Abnormal   Collection Time: 11/21/23  5:26 AM  Result Value Ref Range   Sodium 134 (L) 135 - 145 mmol/L   Potassium 3.5 3.5 - 5.1 mmol/L   Chloride 105 98 - 111 mmol/L   CO2 23 22 - 32 mmol/L   Glucose, Bld 77 70 - 99 mg/dL    Comment: Glucose reference range applies only to samples taken after fasting for at least 8 hours.   BUN 12 8 - 23 mg/dL   Creatinine, Ser 9.14 0.44 - 1.00 mg/dL   Calcium 8.8 (L) 8.9 - 10.3 mg/dL   Total Protein 5.5 (L) 6.5 - 8.1 g/dL   Albumin 2.6 (L) 3.5 - 5.0 g/dL   AST 11 (L) 15 - 41 U/L   ALT 10 0 - 44 U/L   Alkaline Phosphatase 61 38 - 126 U/L   Total Bilirubin 0.5 <1.2 mg/dL   GFR, Estimated >78 >29 mL/min    Comment: (NOTE) Calculated using the CKD-EPI Creatinine Equation (2021)    Anion gap 6 5 - 15    Comment: Performed at [facility], [address] [place], [place] [address]  CBC with Differential/Platelet     Status: Abnormal   Collection Time: 11/21/23  5:26 AM  Result Value Ref Range   WBC 6.9 4.0 - 10.5 K/uL   RBC 3.79 (L) 3.87 - 5.11 MIL/uL   Hemoglobin 11.5 (L) 12.0 - 15.0 g/dL   HCT 08.6 (L) 57.8 - 46.9 %   MCV 90.8 80.0 - 100.0 fL   MCH 30.3 26.0 - 34.0 pg   MCHC 33.4 30.0 - 36.0 g/dL   RDW 62.9 52.8 - 41.3 %   Platelets 198 150 - 400 K/uL   nRBC 0.0 0.0 - 0.2 %   Neutrophils Relative % 50 %   Neutro Abs 3.5 1.7 - 7.7 K/uL   Lymphocytes Relative 37 %   Lymphs Abs 2.5 0.7 - 4.0 K/uL   Monocytes Relative 7 %   Monocytes Absolute 0.5 0.1 - 1.0 K/uL   Eosinophils Relative 5 %   Eosinophils Absolute 0.3 0.0 - 0.5 K/uL   Basophils Relative 1 %   Basophils Absolute 0.0 0.0 - 0.1 K/uL   Immature Granulocytes 0 %   Abs Immature Granulocytes 0.02 0.00 - 0.07 K/uL    Comment: Performed at [facility], [address] [place], [place] [address]  Phosphorus      Status: None   Collection Time: 11/21/23  5:26 AM  Result Value Ref Range   Phosphorus 3.1 2.5 - 4.6 mg/dL    Comment: Performed at [facility], [address] [place], [place] [address]  Magnesium     Status: None   Collection Time: 11/21/23  5:26 AM  Result Value Ref Range   Magnesium 2.1 1.7 - 2.4  mg/dL    Comment: Performed at [facility], [address], [place], [place] [address]  Vitamin B12     Status: None   Collection Time: 11/21/23  9:23 AM  Result Value Ref Range   Vitamin B-12 614 180 - 914 pg/mL    Comment: (NOTE) This assay is not validated for testing neonatal or myeloproliferative syndrome specimens for Vitamin B12 levels. Performed at [facility], [address], [place], [place] [address]   Folate     Status: Abnormal   Collection Time: 11/21/23  9:23 AM  Result Value Ref Range   Folate 4.3 (L) >5.9 ng/mL    Comment: Performed at [facility], [address], [place], [place] [address]  Iron and TIBC     Status: Abnormal   Collection Time: 11/21/23  9:23 AM  Result Value Ref Range   Iron 31 28 - 170 ug/dL   TIBC 914 (L) 782 - 956 ug/dL   Saturation Ratios 15 10.4 - 31.8 %   UIBC 180 ug/dL    Comment: Performed at [facility], [address], [place], [place] [address]  Ferritin     Status: None   Collection Time: 11/21/23  9:23 AM  Result Value Ref Range   Ferritin 190 11 - 307 ng/mL    Comment: Performed at [facility], [address], [place], [place] [address]  Reticulocytes     Status: Abnormal   Collection Time: 11/21/23  9:23 AM  Result Value Ref Range   Retic Ct Pct 0.7 0.4 - 3.1 %   RBC. 3.68 (L) 3.87 - 5.11 MIL/uL   Retic Count, Absolute 25.4 19.0 - 186.0 K/uL   Immature Retic Fract 5.9 2.3 - 15.9 %    Comment: Performed at [facility], [address], [place], [place] [address]  Comprehensive metabolic  panel     Status: None   Collection Time: 11/28/23 10:40 AM  Result Value Ref Range   Sodium 138 135 - 145 mEq/L   Potassium 3.8 3.5 - 5.1 mEq/L   Chloride 106 96 - 112 mEq/L   CO2 25 19 - 32 mEq/L   Glucose, Bld 89 70 - 99 mg/dL   BUN 15 6 - 23 mg/dL   Creatinine, Ser 5.28 0.40 - 1.20 mg/dL   Total Bilirubin 0.3 0.2 - 1.2 mg/dL   Alkaline Phosphatase 82 39 - 117 U/L   AST 16 0 - 37 U/L   ALT 15 0 - 35 U/L   Total Protein 6.5 6.0 - 8.3 g/dL   Albumin 3.7 3.5 - 5.2 g/dL   GFR 41.32 >44.01 mL/min    Comment: Calculated using the CKD-EPI Creatinine Equation (2021)   Calcium 9.9 8.4 - 10.5 mg/dL  CBC with Differential/Platelet     Status: None   Collection Time: 11/28/23 10:40 AM  Result Value Ref Range   WBC 5.4 4.0 - 10.5 K/uL   RBC 4.64 3.87 - 5.11 Mil/uL   Hemoglobin 14.0 12.0 - 15.0 g/dL   HCT 02.7 25.3 - 66.4 %   MCV 89.7 78.0 - 100.0 fl   MCHC 33.6 30.0 - 36.0 g/dL   RDW 40.3 47.4 - 25.9 %   Platelets 338.0 150.0 - 400.0 K/uL   Neutrophils Relative % 63.8 43.0 - 77.0 %   Lymphocytes Relative 26.0 12.0 - 46.0 %   Monocytes Relative 5.9 3.0 - 12.0 %   Eosinophils Relative 3.6 0.0 -  5.0 %   Basophils Relative 0.7 0.0 - 3.0 %   Neutro Abs 3.5 1.4 - 7.7 K/uL   Lymphs Abs 1.4 0.7 - 4.0 K/uL   Monocytes Absolute 0.3 0.1 - 1.0 K/uL   Eosinophils Absolute 0.2 0.0 - 0.7 K/uL   Basophils Absolute 0.0 0.0 - 0.1 K/uL  Lupus anticoagulant panel     Status: Abnormal   Collection Time: 12/20/23 10:02 AM  Result Value Ref Range   PTT Lupus Anticoagulant 50.8 (H) 0.0 - 43.5 sec   DRVVT 71.0 (H) 0.0 - 47.0 sec   Lupus Anticoag Interp Comment:     Comment: (NOTE) No lupus anticoagulant was detected. Mixing studies suggest the presence of an inhibitor. It should be noted that mixing studies performed on samples with minimally extended aPTT results can be equivocal.  Normal plasma can overcome weak inhibitors, also resulting in a correction of the mixing study.  The pattern  observed could be caused by a specific factor inhibitor, dabigatran (a direct thrombin inhibitor anticoagulant), or a direct Xa inhibitor anticoagulant therapy such as rivaroxaban, apixaban or edoxaban. As antibody titers may fluctuate with time, repeat testing may be indicated and ideally should be performed in the absence of anticoagulant therapy. Performed At: [facility], [facility], [facility] [address] [place], [place] [id number] Jolene Schimke MD WU:[id number]   D-dimer, quantitative     Status: None   Collection Time: 12/20/23 10:02 AM  Result Value Ref Range   D-Dimer, Quant 0.41 0.00 - 0.50 ug/mL-FEU    Comment: (NOTE) At the manufacturer cut-off value of 0.5 g/mL FEU, this assay has a negative predictive value of 95-100%.This assay is intended for use in conjunction with a clinical pretest probability (PTP) assessment model to exclude pulmonary embolism (PE) and deep venous thrombosis (DVT) in outpatients suspected of PE or DVT. Results should be correlated with clinical presentation. Performed at [facility], [address], [place], [place] [address]   Beta-2-glycoprotein i abs, IgG/M/A     Status: None   Collection Time: 12/20/23 10:02 AM  Result Value Ref Range   Beta-2 Glyco I IgG <9 0 - 20 GPI IgG units    Comment: (NOTE) The reference interval reflects a 3SD or 99th percentile interval, which is thought to represent a potentially clinically significant result in accordance with the International Consensus Statement on the classification criteria for definitive antiphospholipid syndrome (APS). J Thromb Haem 2006;4:295-306.    Beta-2-Glycoprotein I IgM <9 0 - 32 GPI IgM units    Comment: (NOTE) The reference interval reflects a 3SD or 99th percentile interval, which is thought to represent a potentially clinically significant result in accordance with the International Consensus Statement on the classification criteria for definitive  antiphospholipid syndrome (APS). J Thromb Haem 2006;4:295-306. Performed At: [facility] [address] [place], [place] [id number] Jolene Schimke MD NG:[id number]    Beta-2-Glycoprotein I IgA 21 0 - 25 GPI IgA units    Comment: (NOTE) The reference interval reflects a 3SD or 99th percentile interval, which is thought to represent a potentially clinically significant result in accordance with the International Consensus Statement on the classification criteria for definitive antiphospholipid syndrome (APS). J Thromb Haem 2006;4:295-306.   Cardiolipin antibodies, IgG, IgM, IgA     Status: None   Collection Time: 12/20/23 10:02 AM  Result Value Ref Range   Anticardiolipin IgG <9 0 - 14 GPL U/mL    Comment: (NOTE)  Negative:              <15                          Indeterminate:     15 - 20                          Low-Med Positive: >20 - 80                          High Positive:         >80    Anticardiolipin IgM <9 0 - 12 MPL U/mL    Comment: (NOTE)                          Negative:              <13                          Indeterminate:     13 - 20                          Low-Med Positive: >20 - 80                          High Positive:         >80    Anticardiolipin IgA <9 0 - 11 APL U/mL    Comment: (NOTE)                          Negative:              <12                          Indeterminate:     12 - 20                          Low-Med Positive: >20 - 80                          High Positive:         >80 Performed At: [facility] [address] [place], [place] [id number] Jolene Schimke MD NW:[id number]   Factor 5 leiden     Status: None   Collection Time: 12/20/23 10:02 AM  Result Value Ref Range   Recommendations-F5LEID: Comment     Comment: (NOTE) Result: c.1601G>A (p.Arg534Gln) - Not Detected This result is not associated with an increased risk for venous thromboembolism. See Additional Clinical Information  and Comments. Additional Clinical Information: Venous thromboembolism is a multifactorial disease influenced by genetic, environmental, and circumstantial risk factors. The c.1601G>A (p. Arg534Gln) variant in the F5 gene, commonly referred to as Factor V Leiden, is a genetic risk factor for venous thromboembolism. Heterozygous carriers of this variant have a 6- to 8- fold increased risk for venous thromboembolism. Individuals homozygous for this variant (ie, with a copy of the variant on each chromosome) have an approximately 80-fold increased risk for venous thromboembolism. Individuals who carry both a c.*97G>A variant in the F2 gene and Factor V Leiden have an approximately 20-fold increased risk for  venous thromboembolism. Risks are likely to be even higher in more complex genotype combinations in volving the F2 c.*97G>A variant and Factor V Leiden (PMID: 16109604). Additional risk factors include but are not limited to: deficiency of protein C, protein S, or antithrombin III, age, female sex, personal or family history of deep vein thromboembolism, smoking, surgery, prolonged immobilization, malignant neoplasm, tamoxifen treatment, raloxifene treatment, oral contraceptive use, hormone replacement therapy, and pregnancy. Management of thrombotic risk and thrombotic events should follow established guidelines and fit the clinical circumstance. This result cannot predict the occurrence or recurrence of a thrombotic event. Comment: Genetic counseling is recommended to discuss the potential clinical implications of positive results, as well as recommendations for testing family members. Genetic Coordinators are available for health care providers to discuss results at [phone]). Test Details: Variant Analyzed: c.1601G>A (p. Arg534Gln), referred to as Fact or V Leiden Methods/Limitations: DNA analysis of the F5 gene (NM_000130.5) was performed by PCR amplification  followed by restriction enzyme analysis. The diagnostic sensitivity is >99%. Results must be combined with clinical information for the most accurate interpretation. Molecular- based testing is highly accurate, but as in any laboratory test, diagnostic errors may occur. False positive or false negative results may occur for reasons that include genetic variants, blood transfusions, bone marrow transplantation, somatic or tissue-specific mosaicism, mislabeled samples, or erroneous representation of family relationships. This test was developed and its performance characteristics determined by [facility]. It has not been cleared or approved by the Food and Drug Administration. References: Dewitt Hoes [facility], Valentina Lucks [facility]; ACMG Professional Practice and Guidelines Committee. Addendum: [employer] consensus statement on fac tor V Leiden mutation testing. Genet Med. 2021 Mar 5. doi: 81.1914/N82956-213- 01108-x. PMID: 08657846. Cherrie Gauze. Factor V Leiden Thrombophilia. 1999 May 14 (Updated 2018 Jan 4). In: Bufford Buttner, Ardinger HH, Pagon RA, et al., editors. GeneReviews(R) (Internet). [address] ([place]): [place], [place]; 9629-5284. Available from: [email or web address] Nita Sickle, Blanca Friend, Alvie Heidelberg CS; A[employer]G Laboratory Quality Assurance Committee. Venous thromboembolism laboratory testing (factor V Leiden and factor II c.*97G>A), 2018 update: a technical standard of the [employer] of [employer] and [employer] (ACMG). Genet Med. 2018 Dec;20(12):1489-1498. doi: 10.1038/[phone]. Epub 2018 Oct 5. PMID: 13244010.    Reviewed By: Comment     Comment: (NOTE) Technical Component performed at [employer] [facility] Professional Component performed by: [facility] [employer] Lowella Dell, Ph.D., [facility] Director, Molecular Genetics [address] [place]  [address] Performed At: [facility] [address] [place], [place] [id number] Maurine Simmering MDPhD QV:[id number]   Prothrombin gene mutation     Status: None   Collection Time: 12/20/23 10:02 AM  Result Value Ref Range   Recommendations-PTGENE: Comment     Comment: (NOTE) Result: c.*97G>A - Not Detected This result is not associated with an increased risk for venous thromboembolism. See Additional Clinical Information and Comments. Additional Clinical Information: Venous thromboembolism is a multifactorial disease influenced by genetic, environmental, and circumstantial risk factors. The c.*97G>A variant in the F2 gene is a genetic risk factor for venous thromboembolism. Heterozygous carriers have a 2- to 4-fold increased risk for venous thromboembolism. Homozygotes for the c.*97G>A variant are rare. The annual risk of VTE in homozygotes has been reported to be 1.1%/year. Individuals who carry both a c.*97G>A variant in the F2 gene and a c.1601G>A (p. Arg534Gln) variant in the F5 gene (commonly referred to as Factor V Leiden) have an  approximately 20- fold increased risk for venous thromboembolism. Risks are likely to be even higher in more complex genotype combinations involving the F2 c.*97G>A variant and Factor V Leiden (PMID:  29528413). Additional risk factors include but are not limited to: deficiency of protein C, protein S, or antithrombin III, age, female sex, personal or family history of deep vein thromboembolism, smoking, surgery, prolonged immobilization, malignant neoplasm, tamoxifen treatment, raloxifene treatment, oral contraceptive use, hormone replacement therapy, and pregnancy. Management of thrombotic risk and thrombotic events should follow established guidelines and fit the clinical circumstance. This result cannot predict the occurrence or recurrence of a thrombotic event. Comments: Genetic counseling is recommended to discuss the potential clinical implications  of positive results, as well as recommendations for testing family members. Genetic Coordinators are available for health care providers to discuss results at [phone]). Test Details: Variant analyzed: c.*97G>A, previously referred to as G20210A Methods/Limitations: DNA analysis of the F2 gene (NM_000 506.5) was performed by PCR amplification followed by restriction enzyme analysis. The diagnostic sensitivity is >99%. Results must be combined with clinical information for the most accurate interpretation. Molecular-based testing is highly accurate, but as in any laboratory test, diagnostic errors may occur. False positive or false negative results may occur for reasons that include genetic variants, blood transfusions, bone marrow transplantation, somatic or tissue-specific mosaicism, mislabeled samples, or erroneous representation of family relationships. This test was developed and its performance characteristics determined by [facility]. It has not been cleared or approved by the Food and Drug Administration. References: Dewitt Hoes [facility], Valentina Lucks [facility]; ACMG Professional Practice and Guidelines Committee. Addendum: [employer] consensus statement on factor V Leiden mutation testing. Genet Med. 2021 Mar 5. doi: 10.2725/D664 [phone]-x. PMID: 40347425. Cherrie Gauze. Prothrombin Thrombophilia. 2006 Jul 25 [Updated 2021 Feb 4]. In: Bufford Buttner, Ardinger HH, Pagon RA, et al., editors. GeneReviews(R) [Internet]. [address] ([place]): [place], [place]; 9563-8756. Available from: [email or web address].com/ Nita Sickle, Blanca Friend, Alvie Heidelberg CS; ACMG Laboratory Quality Assurance Committee. Venous thromboembolism laboratory testing (factor V Leiden and factor II c.*97G>A), 2018 update: a technical standard of the [employer] and [employer] (ACMG). Genet Med.  2018 Dec;20(12):1489-1498. doi: 10.1038/s41436-([phone]. Epub 2018 Oct 5. PMID: 43329518.    Reviewed by: Comment     Comment: (NOTE) Technical Component performed at [employer] [facility] Professional Component performed by: [facility] [employer], Ph.D., [facility] Director, Molecular Genetics [phone] [facility] [address] [place] [address] Performed At: [facility] [address] [place], [place] [id number] Maurine Simmering MDPhD TD:[id number]   Comprehensive metabolic panel     Status: Abnormal   Collection Time: 12/20/23 10:02 AM  Result Value Ref Range   Sodium 138 135 - 145 mmol/L   Potassium 3.4 (L) 3.5 - 5.1 mmol/L   Chloride 104 98 - 111 mmol/L   CO2 28 22 - 32 mmol/L   Glucose, Bld 72 70 - 99 mg/dL    Comment: Glucose reference range applies only to samples taken after fasting for at least 8 hours.   BUN 7 (L) 8 - 23 mg/dL   Creatinine, Ser 6.23 0.44 - 1.00 mg/dL   Calcium 9.8 8.9 - 76.2 mg/dL   Total Protein 6.7 6.5 - 8.1 g/dL   Albumin 3.8 3.5 - 5.0 g/dL   AST 15 15 - 41 U/L   ALT 9 0 - 44 U/L   Alkaline Phosphatase 92 38 - 126  U/L   Total Bilirubin 0.4 0.0 - 1.2 mg/dL   GFR, Estimated >16 >10 mL/min    Comment: (NOTE) Calculated using the CKD-EPI Creatinine Equation (2021)    Anion gap 6 5 - 15    Comment: Performed at [facility], [address], [place], [place] [address]  CBC with Differential/Platelet     Status: None   Collection Time: 12/20/23 10:02 AM  Result Value Ref Range   WBC 5.6 4.0 - 10.5 K/uL   RBC 4.75 3.87 - 5.11 MIL/uL   Hemoglobin 14.2 12.0 - 15.0 g/dL   HCT 40.9 81.1 - 91.4 %   MCV 88.2 80.0 - 100.0 fL   MCH 29.9 26.0 - 34.0 pg   MCHC 33.9 30.0 - 36.0 g/dL   RDW 78.2 95.6 - 21.3 %   Platelets 185 150 - 400 K/uL   nRBC 0.0 0.0 - 0.2 %   Neutrophils Relative % 59 %   Neutro Abs 3.3 1.7 - 7.7 K/uL   Lymphocytes Relative 29 %   Lymphs Abs 1.6 0.7 - 4.0 K/uL   Monocytes Relative 7 %    Monocytes Absolute 0.4 0.1 - 1.0 K/uL   Eosinophils Relative 4 %   Eosinophils Absolute 0.2 0.0 - 0.5 K/uL   Basophils Relative 1 %   Basophils Absolute 0.0 0.0 - 0.1 K/uL   Immature Granulocytes 0 %   Abs Immature Granulocytes 0.02 0.00 - 0.07 K/uL    Comment: Performed at [facility], [address], [place], [place] [address]  PTT-LA Mix     Status: Abnormal   Collection Time: 12/20/23 10:02 AM  Result Value Ref Range   PTT-LA Mix 44.1 (H) 0.0 - 40.5 sec    Comment: (NOTE) Performed At: [facility] [address] [place], [place] [id number] Jolene Schimke MD WU:[id number]   Hexagonal Phase Phospholipid     Status: None   Collection Time: 12/20/23 10:02 AM  Result Value Ref Range   Hexagonal Phase Phospholipid 11 0 - 11 sec    Comment: (NOTE) Performed At: [facility] [address] [place], [place] [id number] Jolene Schimke MD KV:[id number]   dRVVT Mix     Status: Abnormal   Collection Time: 12/20/23 10:02 AM  Result Value Ref Range   dRVVT Mix 52.5 (H) 0.0 - 40.4 sec    Comment: (NOTE) Performed At: [facility] [address] [place], [place] [id number] Jolene Schimke MD AC:[id number]   dRVVT Confirm     Status: None   Collection Time: 12/20/23 10:02 AM  Result Value Ref Range   dRVVT Confirm 1.0 0.8 - 1.2 ratio    Comment: (NOTE) Performed At: [facility] [address] [place], [place] [id number] Jolene Schimke MD DU:[id number]      RADIOGRAPHIC STUDIES:  I have personally reviewed the radiological images as listed and agree with the findings in the report.  MM 3D DIAGNOSTIC MAMMOGRAM BILATERAL BREAST Result Date: 12/23/2023 CLINICAL DATA:  Patient had a CT angiogram of the chest at an outside institution on 11/17/2023, which demonstrated an incidental mass in the upper outer right breast. Current exam is for further assessment. Patient was assessed for a mass in this location with diagnostic  imaging and ultrasound in May 2021, found to have a group of cysts. She also has had a small mass in the left breast previously biopsied revealing a papilloma and subsequently surgically removed. No current breast complaints. EXAM: DIGITAL DIAGNOSTIC BILATERAL MAMMOGRAM WITH  TOMOSYNTHESIS AND CAD TECHNIQUE: Bilateral digital diagnostic mammography and breast tomosynthesis was performed. The images were evaluated with computer-aided detection. COMPARISON:  Previous exam(s). ACR Breast Density Category b: There are scattered areas of fibroglandular density. FINDINGS: Mass in the upper outer right breast is stable since 04/15/2020. There are no new or suspicious breast masses, no areas of nonsurgical architectural distortion, no significant areas of asymmetry and no suspicious calcifications. IMPRESSION: 1. No evidence of breast malignancy. 2. Benign, stable mass in the upper outer right breast corresponds to the abnormality noted on the recent chest CT. This corresponds to a group of cysts noted on diagnostic imaging and ultrasound on 04/15/2020. RECOMMENDATION: Screening mammogram in one year.(Code:[id number]) I have discussed the findings and recommendations with the patient. If applicable, a reminder letter will be sent to the patient regarding the next appointment. BI-RADS CATEGORY  2: Benign. Electronically Signed   By: Amie Portland M.D.   On: 12/23/2023 10:42     Orders Placed This Encounter  Procedures   CBC with Differential/Platelet    Standing Status:   Future    Expected Date:   05/21/2024    Expiration Date:   01/02/2025   Protein C activity    Standing Status:   Future    Expected Date:   05/21/2024    Expiration Date:   01/02/2025   PROTEIN S PANEL    Standing Status:   Future    Expected Date:   05/21/2024    Expiration Date:   01/02/2025   D-dimer, quantitative    Standing Status:   Future    Expected Date:   05/21/2024    Expiration Date:   01/02/2025   Antithrombin III antigen     Standing Status:   Future    Expected Date:   05/21/2024    Expiration Date:   01/02/2025     Future Appointments  Date Time Provider Department Center  01/07/2024 11:30 AM Willow Ora, MD [facility]  02/14/2024 10:45 AM [facility] None  02/14/2024 11:20 AM Bobbyjoe Pabst, Archie Patten, MD [facility] None  07/07/2024  9:30 AM Willow Ora, MD [facility]    This document was completed utilizing speech recognition software. Grammatical errors, random word insertions, pronoun errors, and incomplete sentences are an occasional consequence of this system due to software limitations, ambient noise, and hardware issues. Any formal questions or concerns about the content, text or information contained within the body of this dictation should be directly addressed to the provider for clarification.

## 2024-01-03 NOTE — Assessment & Plan Note (Signed)
DVT in the right lower leg confirmed via imaging. No symptoms such as swelling, redness, or pain. Prolonged immobility during a long drive (6 hours) may have contributed to clot formation.   Discussed importance of hydration and taking breaks during long trips to prevent recurrence.  -continue anticoagulation as above.   - Continue wearing compression socks - Monitor for any new symptoms of DVT

## 2024-01-03 NOTE — Assessment & Plan Note (Signed)
-  Currently on Eliquis 5 mg twice daily. No symptoms of pain or respiratory distress. Discussed importance of continuing anticoagulation therapy for at least six months to prevent recurrence and severe complications.   -Long road trips and dehydration may have contributed to clot formation. Emphasized adherence to medication, hydration, and taking breaks during long trips.  Long road trip and dehydration may have contributed to clot formation. Emphasized adherence to medication, hydration, and taking breaks during long trips. Continue wearing compression socks.   Given the degree of thromboembolism, it would be prudent to rule out underlying hypercoagulable state.  Hence we pursued hypercoagulable workup on 12/20/2023 during her initial consultation with [place].  It was grossly unremarkable without evidence of factor V Leiden mutation, prothrombin gene mutation, anticardiolipin antibodies, beta-2 glycoprotein antibodies or lupus anticoagulant.    Protein C activity, protein S activity, Antithrombin III activity were deferred, since they can be falsely abnormal given recent thromboembolism.  We will obtain this on return visit.   Continue Eliquis 5 mg twice daily for six months   D-dimer was normal at 0.41 on 12/20/23, indicating adequate anticoagulation   Consider repeat CT scan at the end of six months if clinically indicated  RTC in June 2025.

## 2024-01-03 NOTE — Assessment & Plan Note (Signed)
Right breast lesion identified on CT scan in December 2024. History of benign left breast lumpectomy in 2021. Delays in obtaining diagnostic imaging due to medical records transfer issues. No palpable mass detected.   She eventually had diagnostic bilateral mammogram on 12/23/2023.  It showed benign, stable mass in the upper outer right breast, corresponding to abnormality noted on her recent chest CT.  This corresponds to a group of cysts noted on diagnostic imaging and ultrasound on 04/15/2020.  BI-RADS 2, benign finding.

## 2024-01-04 ENCOUNTER — Telehealth: Payer: Self-pay | Admitting: Oncology

## 2024-01-04 NOTE — Telephone Encounter (Signed)
Alison [name]

## 2024-01-07 ENCOUNTER — Ambulatory Visit: Payer: No Typology Code available for payment source | Admitting: Family Medicine

## 2024-01-07 ENCOUNTER — Encounter: Payer: Self-pay | Admitting: Family Medicine

## 2024-01-07 VITALS — BP 114/77 | HR 77 | Temp 97.9°F | Ht 62.0 in | Wt 174.4 lb

## 2024-01-07 DIAGNOSIS — I82431 Acute embolism and thrombosis of right popliteal vein: Secondary | ICD-10-CM

## 2024-01-07 DIAGNOSIS — E890 Postprocedural hypothyroidism: Secondary | ICD-10-CM | POA: Diagnosis not present

## 2024-01-07 DIAGNOSIS — E782 Mixed hyperlipidemia: Secondary | ICD-10-CM | POA: Diagnosis not present

## 2024-01-07 DIAGNOSIS — I2699 Other pulmonary embolism without acute cor pulmonale: Secondary | ICD-10-CM | POA: Diagnosis not present

## 2024-01-07 DIAGNOSIS — I10 Essential (primary) hypertension: Secondary | ICD-10-CM

## 2024-01-07 DIAGNOSIS — F5101 Primary insomnia: Secondary | ICD-10-CM

## 2024-01-07 DIAGNOSIS — L299 Pruritus, unspecified: Secondary | ICD-10-CM

## 2024-01-07 LAB — TSH: TSH: 11.48 u[IU]/mL — ABNORMAL HIGH (ref 0.35–5.50)

## 2024-01-07 LAB — LIPID PANEL
Cholesterol: 209 mg/dL — ABNORMAL HIGH (ref 0–200)
HDL: 68.7 mg/dL (ref >39.00)
LDL Cholesterol: 124 mg/dL — ABNORMAL HIGH (ref 0–99)
NonHDL: 140.32
Total CHOL/HDL Ratio: 3
Triglycerides: 82 mg/dL (ref 0.0–149.0)
VLDL: 16.4 mg/dL (ref 0.0–40.0)

## 2024-01-07 MED ORDER — TRAZODONE HCL 50 MG PO TABS: 25.0000 mg | ORAL_TABLET | Freq: Every evening | ORAL | 3 refills | Status: AC | PRN

## 2024-01-07 NOTE — Progress Notes (Signed)
Subjective  CC:  Chief Complaint  Patient presents with   Hypertension   embolism   Hypothyroidism    HPI: Alison [name] is a 63 y.o. female who presents to the office today to address the problems listed above in the chief complaint. PE DVT provoked.  Reviewed hematology notes.  Remains on Eliquis.  No adverse effects.  Hypercoagulable workup negative today.  Will complete 6 months and then reassess. Hypertension: No longer on medications.  Has resolved with weight loss. Due for recheck of thyroid now on 100 mcg levothyroxine, decreased dose and feeling well. Hyperlipidemia: Very elevated LDL when last checked, but this was in the setting of uncontrolled hypothyroidism.  Will recheck nonfasting levels today. Complains of insomnia that she treats with Xanax.  She has used this in the past for anxiety but likes it for sleep.  Has used trazodone in the past but feels groggy in the morning.  Was on 100 mg nightly.  Likely some stress related etiologies Complains of nightly itching, or at least 3 times weekly ongoing for the last 6 to 7 months.  Only happens when she has quiet or downtime at night.  No rashes.  She scratches.  Has used gabapentin which was helpful.  Does not like Benadryl and Zyrtec was not helpful.  Assessment  1. Other acute pulmonary embolism without acute cor pulmonale (HCC)   2. Acute deep vein thrombosis (DVT) of right popliteal vein (HCC)   3. Essential hypertension   4. Postablative hypothyroidism   5. Mixed hyperlipidemia   6. Primary insomnia   7. Pruritus      Plan  PE/DVT: Continue Eliquis for 6 months total and then reassess.  Stable Hypertension: Resolved Recheck TSH on levothyroxine 100 mcg daily. Hyperlipidemia: Recheck levels and may need to adjust statin.  Remains on Crestor 40 Insomnia: Had long discussion.  Trial of trazodone 25 mg nightly.  Work on anxiety stress reduction.  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 Pruritus: Likely  related to anxiety as well.  Follow up: 6 months for complete physical Visit date not found  Orders Placed This Encounter  Procedures   TSH   Lipid panel   No orders of the defined types were placed in this encounter.     I reviewed the patients updated PMH, FH, and SocHx.    Patient Active Problem List   Diagnosis Date Noted   Acute deep vein thrombosis (DVT) of right popliteal vein (HCC) 11/28/2023    Priority: High   Acute pulmonary embolism without acute cor pulmonale (HCC) 11/19/2023    Priority: High   Hiatal hernia 06/24/2023    Priority: High   Class 2 severe obesity with body mass index (BMI) of 35 to 39.9 with serious comorbidity (HCC) 03/02/2020    Priority: High   Postablative hypothyroidism 02/03/2020    Priority: High   Systolic murmur 09/29/2019    Priority: High   GAD (generalized anxiety disorder) 06/24/2018    Priority: High   Mixed hyperlipidemia 06/24/2018    Priority: High   Essential hypertension 05/31/2018    Priority: High   Family history of premature CAD 12/29/2013    Priority: High   History of TIA (transient ischemic attack) 12/10/1980    Priority: High   Gastroesophageal reflux disease 10/11/2020    Priority: Medium    S/P gastric sleeve procedure 10/11/2020    Priority: Medium    Mild intermittent asthma without complication 06/24/2018  Priority: Medium    Primary osteoarthritis of both knees 09/11/2016    Priority: Medium    Gastric banding status 10/07/2015    Priority: Medium    Restless legs syndrome (RLS) 02/14/2015    Priority: Medium    Insomnia 12/29/2013    Priority: Medium    Status post right knee replacement 06/24/2023    Priority: Low   Status post left knee replacement 01/04/2023    Priority: Low   Mass of right breast 12/20/2023   Aortic valve calcification 12/20/2023   Healthcare maintenance 12/20/2023   Hypoalbuminemia due to protein-calorie malnutrition (HCC) 11/28/2023   Subcutaneous nodule of breast  11/19/2023   No outpatient medications have been marked as taking for the 01/07/24 encounter (Office Visit) with Willow Ora, MD.    Allergies: Patient is allergic to cherry, other, nsaids, oxycodone, soy allergy (do not select), and penicillins. Family History: Patient family history includes Anesthesia problems in her sister; Breast cancer (age of onset: 59) in her maternal aunt; Cancer in her maternal aunt; Colon polyps in her mother; Glaucoma in her mother; Heart disease in her father; Hyperthyroidism in her mother; Migraines in her sister; Myasthenia gravis in her paternal grandfather; Ovarian cancer in her maternal grandmother; Stroke in her father; Uterine cancer in her maternal grandmother. Social History:  Patient  reports that she has never smoked. She has never used smokeless tobacco. She reports that she does not currently use alcohol. She reports that she does not use drugs.  Review of Systems: Constitutional: Negative for fever malaise or anorexia Cardiovascular: negative for chest pain Respiratory: negative for SOB or persistent cough Gastrointestinal: negative for abdominal pain  Objective  Vitals: BP 114/77   Pulse 77   Temp 97.9 F (36.6 C)   Ht 5\' 2"  (1.575 m)   Wt 174 lb 6.4 oz (79.1 kg)   SpO2 97%   BMI 31.90 kg/m  General: no acute distress , A&Ox3  Commons side effects, risks, benefits, and alternatives for medications and treatment plan prescribed today were discussed, and the patient expressed understanding of the given instructions. Patient is instructed to call or message via MyChart if he/she has any questions or concerns regarding our treatment plan. No barriers to understanding were identified. We discussed Red Flag symptoms and signs in detail. Patient expressed understanding regarding what to do in case of urgent or emergency type symptoms.  Medication list was reconciled, printed and provided to the patient in AVS. Patient instructions and summary  information was reviewed with the patient as documented in the AVS. This note was prepared with assistance of Dragon voice recognition software. Occasional wrong-word or sound-a-like substitutions may have occurred due to the inherent limitations of voice recognition software

## 2024-01-12 ENCOUNTER — Other Ambulatory Visit: Payer: Self-pay | Admitting: Medical Genetics

## 2024-01-13 ENCOUNTER — Other Ambulatory Visit: Payer: Self-pay

## 2024-01-13 ENCOUNTER — Encounter: Payer: Self-pay | Admitting: Family Medicine

## 2024-01-13 DIAGNOSIS — R7989 Other specified abnormal findings of blood chemistry: Secondary | ICD-10-CM

## 2024-01-13 MED ORDER — LEVOTHYROXINE SODIUM 112 MCG PO TABS: 112.0000 ug | ORAL_TABLET | Freq: Every day | ORAL | 3 refills | Status: DC

## 2024-01-13 NOTE — Progress Notes (Signed)
Please call patient: Thyroid levels are too low on the levothyroxine daily and too high on the daily so will need to adjust again: so will adjust up to daily and recheck again in 8 weeks with lab visit; please order meds (levothyroxine daily, #90, 3rf and stop the dose) and order future TSH and notify pt about dose change and to schedule lab visit.  thanks

## 2024-01-22 ENCOUNTER — Encounter: Payer: Self-pay | Admitting: Internal Medicine

## 2024-02-14 ENCOUNTER — Ambulatory Visit: Payer: No Typology Code available for payment source | Admitting: Oncology

## 2024-02-14 ENCOUNTER — Other Ambulatory Visit: Payer: No Typology Code available for payment source

## 2024-03-02 ENCOUNTER — Other Ambulatory Visit: Payer: Self-pay | Admitting: Gastroenterology

## 2024-03-04 ENCOUNTER — Telehealth: Payer: Self-pay

## 2024-03-04 NOTE — Telephone Encounter (Signed)
 See alternate telephone encounter for Eliquis hold details - response pending. Will cancel [facility] appt and move to hospital based setting.

## 2024-03-04 NOTE — Telephone Encounter (Signed)
 Left message for pt to call back.  Dr Leonides Schanz has availability on 04/30/2024 at [facility]

## 2024-03-04 NOTE — Telephone Encounter (Signed)
 John, Please review this patient's most recent ECHO.  It is reported that the patient's EF is 60-65% and also states mild to moderate AVS. Please advise if this patient is cleared for [facility] procedure. Thank you Bre

## 2024-03-04 NOTE — Telephone Encounter (Signed)
  KEYANAH KOZICKI Jun 14, 1961 [id number]  March 26th, 2025   Dear Meryl Crutch, MD:  We have scheduled the above named patient for an endoscopic procedure. Our records show that she is on anticoagulation therapy.  Please advise as to whether the patient may come off their therapy of Eliquis 2 days prior to their procedure, appointment date TBD.  Please route your response to P [facility] POD B TRIAGE or fax response to [phone].  Sincerely,   [facility]

## 2024-03-04 NOTE — Telephone Encounter (Signed)
 Patty, (out of office until 03/05/2024), covering RN,  This patient has been scheduled for a recall EGD on 04/01/2024 with Dr. Leonides Schanz and her PV is scheduled in rm 52 on 03/17/2024.   Will you please obtain an ELIQUIS hold for this patient. Please/thank you Bre

## 2024-03-05 ENCOUNTER — Telehealth: Payer: Self-pay

## 2024-03-05 ENCOUNTER — Other Ambulatory Visit: Payer: Self-pay

## 2024-03-05 DIAGNOSIS — K317 Polyp of stomach and duodenum: Secondary | ICD-10-CM

## 2024-03-05 DIAGNOSIS — K2980 Duodenitis without bleeding: Secondary | ICD-10-CM

## 2024-03-05 NOTE — Telephone Encounter (Signed)
1st attempt to reach pt regarding surgical clearance and the need for a tele visit.  Left a message for pt to call back and ask for the preop team. 

## 2024-03-05 NOTE — Telephone Encounter (Signed)
 Pt made aware of the change of the location.  Pt was ordered and scheduled for her EGD to be done Alison [name] on 04/30/2024 at 11:00 with Dr. Leonides Schanz. Pt made aware.  Ambulatory referral to GI placed in Epic. Prep instructions were sent to pt via my chart. Pt made aware.  Cardiac clearance sent via Epic.  Pt verbalized understanding with all questions answered.

## 2024-03-05 NOTE — Telephone Encounter (Signed)
 [facility] Pre-operative Risk Assessment     Request for surgical clearance:     Endoscopy Procedure  What type of surgery is being performed?     Upper Endoscopy  When is this surgery scheduled?     04/30/2024   What type of clearance is required ?   Medical Clearance   Are there any medications that need to be held prior to surgery and how long?   Practice name and name of physician performing surgery?      [facility]/ Dr. Leonides Schanz  What is your office phone and fax number?      Phone- [phone]  Fax- [phone]  Anesthesia type (None, local, MAC, general) ?       MAC   Please route your response to Emeline Darling RN

## 2024-03-05 NOTE — Telephone Encounter (Signed)
   Name: Alison [name]  DOB: [birth year]  MRN: [id number]  Primary Cardiologist: Kristeen Miss, MD  Chart reviewed as part of pre-operative protocol coverage. Because of Fotini Lemus Vanorder's past medical history and time since last visit, she will require a follow-up telephone visit in order to better assess preoperative cardiovascular risk.  Pre-op covering staff: - Please schedule appointment and call patient to inform them. If patient already had an upcoming appointment within acceptable timeframe, please add "pre-op clearance" to the appointment notes so provider is aware. - Please contact requesting surgeon's office via preferred method (i.e, phone, fax) to inform them of need for appointment prior to surgery.   No medications indicated as needing held.  Sharlene Dory, PA-C  03/05/2024, 9:54 AM

## 2024-03-06 ENCOUNTER — Telehealth: Payer: Self-pay | Admitting: *Deleted

## 2024-03-06 NOTE — Telephone Encounter (Signed)
 S/w pt is scheduled for tele pre op on 04/14/24.  Will send to requested surgeons office and will remove from pre op pool.    Patient Consent for Virtual Visit         Alison [name] has provided verbal consent on 03/06/2024 for a virtual visit (video or telephone).   CONSENT FOR VIRTUAL VISIT FOR:  Alison [name]  By participating in this virtual visit I agree to the following:  I hereby voluntarily request, consent and authorize [facility] and its employed or contracted physicians, physician assistants, nurse practitioners or other licensed health care professionals (the Practitioner), to provide me with telemedicine health care services (the "Services") as deemed necessary by the treating Practitioner. I acknowledge and consent to receive the Services by the Practitioner via telemedicine. I understand that the telemedicine visit will involve communicating with the Practitioner through live audiovisual communication technology and the disclosure of certain medical information by electronic transmission. I acknowledge that I have been given the opportunity to request an in-person assessment or other available alternative prior to the telemedicine visit and am voluntarily participating in the telemedicine visit.  I understand that I have the right to withhold or withdraw my consent to the use of telemedicine in the course of my care at any time, without affecting my right to future care or treatment, and that the Practitioner or I may terminate the telemedicine visit at any time. I understand that I have the right to inspect all information obtained and/or recorded in the course of the telemedicine visit and may receive copies of available information for a reasonable fee.  I understand that some of the potential risks of receiving the Services via telemedicine include:  Delay or interruption in medical evaluation due to technological equipment failure or disruption; Information transmitted may  not be sufficient (e.g. poor resolution of images) to allow for appropriate medical decision making by the Practitioner; and/or  In rare instances, security protocols could fail, causing a breach of personal health information.  Furthermore, I acknowledge that it is my responsibility to provide information about my medical history, conditions and care that is complete and accurate to the best of my ability. I acknowledge that Practitioner's advice, recommendations, and/or decision may be based on factors not within their control, such as incomplete or inaccurate data provided by me or distortions of diagnostic images or specimens that may result from electronic transmissions. I understand that the practice of medicine is not an exact science and that Practitioner makes no warranties or guarantees regarding treatment outcomes. I acknowledge that a copy of this consent can be made available to me via my patient portal [facility] MyChart), or I can request a printed copy by calling the office of [facility].    I understand that my insurance will be billed for this visit.   I have read or had this consent read to me. I understand the contents of this consent, which adequately explains the benefits and risks of the Services being provided via telemedicine.  I have been provided ample opportunity to ask questions regarding this consent and the Services and have had my questions answered to my satisfaction. I give my informed consent for the services to be provided through the use of telemedicine in my medical care

## 2024-03-06 NOTE — Telephone Encounter (Signed)
 Pt returning call

## 2024-03-11 NOTE — Telephone Encounter (Signed)
 Awaiting ELIQUIS hold information for PV appt

## 2024-03-17 ENCOUNTER — Telehealth: Payer: Self-pay | Admitting: *Deleted

## 2024-03-17 ENCOUNTER — Encounter: Payer: Self-pay | Admitting: Oncology

## 2024-03-17 ENCOUNTER — Ambulatory Visit (AMBULATORY_SURGERY_CENTER): Payer: No Typology Code available for payment source | Admitting: *Deleted

## 2024-03-17 ENCOUNTER — Telehealth: Payer: Self-pay

## 2024-03-17 VITALS — Ht 62.0 in | Wt 174.0 lb

## 2024-03-17 DIAGNOSIS — D131 Benign neoplasm of stomach: Secondary | ICD-10-CM

## 2024-03-17 NOTE — Progress Notes (Addendum)
 Pt's name and DOB verified at the beginning of the pre-visit wit 2 identifiers  Pt denies any difficulty with ambulating,sitting, laying down or rolling side to side  Pt has no issues with ambulation   Pt has no issues moving head neck or swallowing  No egg or soy allergy known to patient   No issues known to pt with past sedation with any surgeries or procedures  Pt denies having issues being intubated  No FH of Malignant Hyperthermia  Pt is not on diet pills or shots  Pt is not on home 02   Pt is not on blood thinners   Pt denies issues with constipation   Pt is not on dialysis  Pt denise any abnormal heart rhythms   Pt denies any upcoming cardiac testing  Patient's chart reviewed by Cathlyn Parsons CNRA prior to pre-visit and patient appropriate for the [facility].  Pre-visit completed and red dot placed by patient's name on their procedure day (on provider's schedule).    Visit by phone  Pt states weight is 174 lb  IInstructions reviewed. Pt given [facility] main # and MD on call # prior to instructions.  Pt states understanding of instructions. Instructed to review again prior to procedure. Pt states they will.   Pt states she will call the oncologist who handles her blood thinner and request how long they want her to hold it. RN to notify Dr Derek Mound RN to make him  aware that pt's med is handled by Onc MD and to contact the office to facilitate getting the order on the hold for med.

## 2024-03-17 NOTE — Telephone Encounter (Signed)
 [facility]  Pre-operative Risk Assessment     Request for surgical clearance:     Endoscopy Procedure  What type of surgery is being performed?     Upper Endoscopy  When is this surgery scheduled?     04/30/2024  What type of clearance is required ?   Pharmacy  Are there any medications that need to be held prior to surgery and how long? Eliquis 2 days  Practice name and name of physician performing surgery?      [facility]/ Dr. Leonides Schanz  What is your office phone and fax number?      Phone- [phone]  Fax- [phone]  Anesthesia type (None, local, MAC, general) ?       MAC   Please route your response to Emeline Darling RN

## 2024-03-17 NOTE — Telephone Encounter (Signed)
 Clearance request for Eliquis sent to Dr. Arlana Pouch.

## 2024-03-17 NOTE — Telephone Encounter (Signed)
 Left message for pt to call back

## 2024-03-17 NOTE — Telephone Encounter (Signed)
 Pt made aware of the Eliquis Hold for 2 days prior to the procedure.  Pt verbalized understanding with all questions answered.

## 2024-03-17 NOTE — Telephone Encounter (Signed)
 Patient is returning a call to [place]. Patient is requesting a call back. Please advise.

## 2024-03-25 NOTE — Telephone Encounter (Signed)
 Chart reviewed and noted:  Arlo Berber, MD to Alison Simons, RN     03/05/24  2:10 PM Alison [name] had DVT and pulmonary embolism in December 2024.  Since it has been 3 months since her diagnosis, it is okay for her to take a break from anticoagulation for 2 days prior to endoscopic procedure.  She needs to resume anticoagulation the day after the procedure.  Tentative date of completion for her anticoagulation is end of June 2025.  Please let me know if you have any additional questions or concerns.  Thank you, Arlo Berber, MD

## 2024-03-28 ENCOUNTER — Other Ambulatory Visit: Payer: Self-pay | Admitting: Family Medicine

## 2024-03-31 NOTE — Telephone Encounter (Signed)
 ELIQUIS  hold information on 03/17/2024 telephone encounted - please see notation for info

## 2024-04-01 ENCOUNTER — Encounter: Payer: No Typology Code available for payment source | Admitting: Internal Medicine

## 2024-04-14 ENCOUNTER — Ambulatory Visit: Attending: Cardiology | Admitting: Nurse Practitioner

## 2024-04-14 ENCOUNTER — Encounter: Payer: Self-pay | Admitting: Nurse Practitioner

## 2024-04-14 DIAGNOSIS — Z01810 Encounter for preprocedural cardiovascular examination: Secondary | ICD-10-CM | POA: Diagnosis not present

## 2024-04-14 NOTE — Progress Notes (Signed)
 Virtual Visit via Telephone Note   Because of Alison [name] co-morbid illnesses, she is at least at moderate risk for complications without adequate follow up.  This format is felt to be most appropriate for this patient at this time.  Due to technical limitations with video connection Web designer), today's appointment will be conducted as an audio only telehealth visit, and Alison [name] verbally agreed to proceed in this manner.   All issues noted in this document were discussed and addressed.  No physical exam could be performed with this format.  Evaluation Performed:  Preoperative cardiovascular risk assessment _____________   Date:  04/14/2024   Patient ID:  Alison [name], DOB 12-Jan-1961, MRN [id number] Patient Location:  Home Provider location:   Office  Primary Care Provider:  Luevenia Saha, MD Primary Cardiologist:  Ahmad Alert, MD  Chief Complaint / Patient Profile   63 y.o. y/o female with a h/o HTN, HLD, obesity s/p gastric banding, bicuspid aortic valve with moderate calcification, history of DVT/PE on Eliquis , with no finding of bleeding disorder by hem/onc on echo 07/2023 who is pending endoscopy with Dr. [name] on 04/30/24 and presents today for telephonic preoperative cardiovascular risk assessment.  History of Present Illness    Alison [name] is a 63 y.o. female who presents via audio/video conferencing for a telehealth visit today.  Pt was last seen in cardiology clinic on 08/15/2023 by Dr. Alroy Aspen.  At that time Alison [name] was doing well.  The patient is now pending procedure as outlined above. Since her last visit, she denies chest pain, shortness of breath, lower extremity edema, fatigue, palpitations, melena, hematuria, hemoptysis, diaphoresis, weakness, presyncope, syncope, orthopnea, and PND. She is very active with regular exercise, house work and yard work and is not having any concerning cardiac symptoms.    Past Medical History    Past Medical History:   Diagnosis Date   Anxiety    Arthritis    LEFT KNEE   Asthma    Cataract    Cervical myelopathy (HCC) 08/28/2018   GERD (gastroesophageal reflux disease)    Heart murmur    Hiatal hernia    Hx-TIA (transient ischemic attack) 1982   Hyperlipidemia    Hypertension    not on meds for 2 mos at preop on 02/13/23   Hypothyroidism    Obesity    Pneumonia    Polycystic ovarian syndrome    TIA (transient ischemic attack)    Birth Control induced per pt 1982   Past Surgical History:  Procedure Laterality Date   ABDOMINAL HYSTERECTOMY  2009   Partial   ANTERIOR CERVICAL DECOMPRESSION/DISCECTOMY FUSION 4 LEVELS N/A 08/28/2018   Procedure: ANTERIOR CERVICAL DECOMPRESSION FUSION, CERVICAL 4-5, CERVICAL 5-6, CERVICAL 6-7 WITH INSTRUMENTATION AND ALLOGRAFT;  Surgeon: Virl Grimes, MD;  Location: [facility] OR;  Service: Orthopedics;  Laterality: N/A;   BREAST LUMPECTOMY Left 06/27/2020   Procedure: LEFT BREAST LUMPECTOMY;  Surgeon: Oza Blumenthal, MD;  Location: [facility] OR;  Service: General;  Laterality: Left;  LMA   DIAGNOSTIC LAPAROSCOPY     FEMUR FRACTURE SURGERY  2008   Left   FRACTURE SURGERY     I & D EXTREMITY Left 06/01/2018   Procedure: IRRIGATION AND DEBRIDEMENT LEFT CALF;  Surgeon: Arnie Lao, MD;  Location: [facility] OR;  Service: Orthopedics;  Laterality: Left;   KNEE CARTILAGE SURGERY  2005   LAPAROSCOPIC GASTRIC BAND REMOVAL WITH LAPAROSCOPIC GASTRIC SLEEVE RESECTION N/A 05/03/2020   Procedure: LAPAROSCOPIC GASTRIC BAND  REMOVAL WITH LAPAROSCOPIC GASTRIC SLEEVE RESECTION; UPPER ENDO AND ERAS PATHWAY;  Surgeon: Kinsinger, Alphonso Aschoff, MD;  Location: [facility] ORS;  Service: General;  Laterality: N/A;   LAPAROSCOPIC GASTRIC BANDING  11/12/2011   Procedure: LAPAROSCOPIC GASTRIC BANDING;  Surgeon: Gorman Laughter, DO;  Location: [facility] ORS;  Service: General;  Laterality: N/A;  nathanson liver retractor/ endostitch available 2-0 surgidac refills/ layton needle drivers available/ Allergan Doctor to  proctor   TONSILLECTOMY  2007   TOTAL KNEE ARTHROPLASTY Left 10/31/2022   Procedure: TOTAL KNEE ARTHROPLASTY;  Surgeon: Murleen Arms, MD;  Location: [facility] ORS;  Service: Orthopedics;  Laterality: Left;   TOTAL KNEE ARTHROPLASTY Right 02/25/2023   Procedure: TOTAL KNEE ARTHROPLASTY;  Surgeon: Murleen Arms, MD;  Location: [facility] ORS;  Service: Orthopedics;  Laterality: Right;    Allergies  Allergies  Allergen Reactions   Aspirin Anaphylaxis   Cherry Hives and Shortness Of Breath   Other Hives and Itching    Walnuts and pecans - cause itching and hives in mouth   Nsaids Other (See Comments)    Per patient, Drs have told her not to take nsaids because she was a bad reaction to aspirin   Oxycodone  Itching    Severe itching   Soy Allergy (Obsolete) Hives    " hives in mouth and wheezing"    Penicillins Itching    Has patient had a PCN reaction causing immediate rash, facial/tongue/throat swelling, SOB or lightheadedness with hypotension: No  Has patient had a PCN reaction causing severe rash involving mucus membranes or skin necrosis: No  Has patient had a PCN reaction that required hospitalization: No  Has patient had a PCN reaction occurring within the last 10 years: No  If all of the above answers are "NO", then may proceed with Cephalosporin use.    Home Medications    Prior to Admission medications   Medication Sig Start Date End Date Taking? Authorizing Provider  acetaminophen  (TYLENOL ) 325 MG tablet Take 2 tablets (650 mg total) by mouth every 6 (six) hours as needed for mild pain (pain score 1-3) (or Fever >/= 101). 11/21/23   Aura Leeds Latif, DO  albuterol  (VENTOLIN  HFA) 108 [phone] Base) MCG/ACT inhaler Inhale 2 puffs into the lungs every 6 (six) hours as needed. For asthma 06/22/22   Luevenia Saha, MD  ALPRAZolam  (XANAX ) 0.25 MG tablet TAKE 1 TABLET BY MOUTH AT BEDTIME AS NEEDED FOR ANXIETY OR SLEEP. 12/19/23   Luevenia Saha, MD  Blood Pressure Monitoring Soln  KIT  04/16/23   [provider]  calcium  carbonate (TUMS - DOSED IN MG ELEMENTAL CALCIUM ) 500 MG chewable tablet Chew 3 tablets by mouth 3 (three) times daily as needed for indigestion or heartburn.    [provider]  ELIQUIS  5 MG TABS tablet Take 1 tablet (5 mg total) by mouth 2 (two) times daily. 12/20/23   Pasam, Gale Jude, MD  escitalopram  (LEXAPRO ) 10 MG tablet TAKE 1 TABLET BY MOUTH EVERY DAY 09/25/23   Luevenia Saha, MD  famotidine  (PEPCID ) 20 MG tablet TAKE 1 TABLET BY MOUTH EVERYDAY AT BEDTIME 03/02/24   Zehr, Jessica D, PA-C  hydrochlorothiazide  (MICROZIDE ) 12.5 MG capsule TAKE 1 CAPSULE BY MOUTH EVERY DAY 03/29/24   Webb, Padonda B, FNP  levothyroxine  (SYNTHROID ) 112 MCG tablet Take 1 tablet (112 mcg total) by mouth daily. 01/13/24   Luevenia Saha, MD  Multiple Vitamin (MULTIVITAMIN) capsule Take 1 capsule by mouth daily.    [provider]  pantoprazole  (PROTONIX ) 40 MG tablet Take 1 tablet (40 mg total) by mouth 2 (two) times daily. 12/27/23   Daina Drum, MD  rOPINIRole  (REQUIP ) 3 MG tablet Take 1 tablet (3 mg total) by mouth at bedtime. 06/24/23   Luevenia Saha, MD  rosuvastatin  (CRESTOR ) 40 MG tablet Take 1 tablet (40 mg total) by mouth at bedtime. 07/26/23   Luevenia Saha, MD  traZODone  (DESYREL ) 50 MG tablet Take 0.5-1 tablets (25-50 mg total) by mouth at bedtime as needed for sleep. 01/07/24   Luevenia Saha, MD    Physical Exam    Vital Signs:  Alison [name] does not have vital signs available for review today.  Given telephonic nature of communication, physical exam is limited. AAOx3. NAD. Normal affect.  Speech and respirations are unlabored.  Accessory Clinical Findings    None  Assessment & Plan    1.  Preoperative Cardiovascular Risk Assessment: According to the Revised Cardiac Risk Index (RCRI), her Perioperative Risk of Major Cardiac Event is (%): 0.4. Her Functional Capacity in METs is: 8.97 according to the Duke Activity Status Index  (DASI). The patient is doing well from a cardiac perspective. Therefore, based on ACC/AHA guidelines, the patient would be at acceptable risk for the planned procedure without further cardiovascular testing.   The patient was advised that if she develops new symptoms prior to surgery to contact our office to arrange for a follow-up visit, and she verbalized understanding.  Eliquis  hold addressed by oncology due to history of DVT/PE.  A copy of this note will be routed to requesting surgeon.  Time:   Today, I have spent 10 minutes with the patient with telehealth technology discussing medical history, symptoms, and management plan.     Gerldine Koch, NP-C  04/14/2024, 10:02 AM [address], [address] [place], [place] [address] Office [phone] Fax [phone]

## 2024-04-16 ENCOUNTER — Encounter: Payer: Self-pay | Admitting: Internal Medicine

## 2024-04-23 ENCOUNTER — Telehealth: Payer: Self-pay | Admitting: Gastroenterology

## 2024-04-23 NOTE — Telephone Encounter (Signed)
 Procedure:Endoscopy Procedure date: 04/30/24 Procedure location: [facility] Arrival Time: 9:15 am Spoke with the patient Y/N: Yes Any prep concerns? No  Has the patient obtained the prep from the pharmacy ? No prep needed Do you have a care partner and transportation: Yes Any additional concerns? No

## 2024-04-30 ENCOUNTER — Other Ambulatory Visit: Payer: Self-pay | Admitting: Internal Medicine

## 2024-04-30 ENCOUNTER — Ambulatory Visit (HOSPITAL_COMMUNITY)
Admission: RE | Admit: 2024-04-30 | Discharge: 2024-04-30 | Disposition: A | Discharge status: Home / Self Care | Attending: Internal Medicine | Admitting: Internal Medicine

## 2024-04-30 ENCOUNTER — Ambulatory Visit (HOSPITAL_COMMUNITY): Admitting: Anesthesiology

## 2024-04-30 ENCOUNTER — Other Ambulatory Visit: Payer: Self-pay

## 2024-04-30 ENCOUNTER — Encounter (HOSPITAL_COMMUNITY): Payer: Self-pay | Admitting: Internal Medicine

## 2024-04-30 ENCOUNTER — Encounter (HOSPITAL_COMMUNITY)
Admission: RE | Disposition: A | Discharge status: Home / Self Care | Payer: Self-pay | Source: Home / Self Care | Attending: Internal Medicine

## 2024-04-30 DIAGNOSIS — I10 Essential (primary) hypertension: Secondary | ICD-10-CM | POA: Diagnosis not present

## 2024-04-30 DIAGNOSIS — K317 Polyp of stomach and duodenum: Secondary | ICD-10-CM | POA: Insufficient documentation

## 2024-04-30 DIAGNOSIS — K449 Diaphragmatic hernia without obstruction or gangrene: Secondary | ICD-10-CM | POA: Insufficient documentation

## 2024-04-30 DIAGNOSIS — Z79899 Other long term (current) drug therapy: Secondary | ICD-10-CM | POA: Insufficient documentation

## 2024-04-30 DIAGNOSIS — M1990 Unspecified osteoarthritis, unspecified site: Secondary | ICD-10-CM | POA: Diagnosis not present

## 2024-04-30 DIAGNOSIS — K2101 Gastro-esophageal reflux disease with esophagitis, with bleeding: Secondary | ICD-10-CM | POA: Diagnosis not present

## 2024-04-30 DIAGNOSIS — Z09 Encounter for follow-up examination after completed treatment for conditions other than malignant neoplasm: Secondary | ICD-10-CM | POA: Diagnosis present

## 2024-04-30 DIAGNOSIS — Z9884 Bariatric surgery status: Secondary | ICD-10-CM | POA: Insufficient documentation

## 2024-04-30 DIAGNOSIS — E039 Hypothyroidism, unspecified: Secondary | ICD-10-CM | POA: Diagnosis not present

## 2024-04-30 DIAGNOSIS — J45909 Unspecified asthma, uncomplicated: Secondary | ICD-10-CM | POA: Insufficient documentation

## 2024-04-30 DIAGNOSIS — Z7951 Long term (current) use of inhaled steroids: Secondary | ICD-10-CM | POA: Diagnosis not present

## 2024-04-30 DIAGNOSIS — F419 Anxiety disorder, unspecified: Secondary | ICD-10-CM | POA: Insufficient documentation

## 2024-04-30 SURGERY — EGD (ESOPHAGOGASTRODUODENOSCOPY)
Anesthesia: Monitor Anesthesia Care

## 2024-04-30 MED ORDER — PROPOFOL 10 MG/ML IV BOLUS
INTRAVENOUS | Status: DC | PRN
  Administered 2024-04-30: 70 mg via INTRAVENOUS

## 2024-04-30 MED ORDER — PROPOFOL 500 MG/50ML IV EMUL
INTRAVENOUS | Status: DC | PRN
  Administered 2024-04-30: 50 ug/kg/min via INTRAVENOUS

## 2024-04-30 MED ORDER — SODIUM CHLORIDE 0.9 % IV SOLN: INTRAVENOUS | Status: DC

## 2024-04-30 MED ORDER — SUCRALFATE 1 GM/10ML PO SUSP: 1.0000 g | Freq: Four times a day (QID) | ORAL | 0 refills | Status: DC

## 2024-04-30 MED ORDER — LIDOCAINE 2% (20 MG/ML) 5 ML SYRINGE
INTRAMUSCULAR | Status: DC | PRN
  Administered 2024-04-30: 100 mg via INTRAVENOUS

## 2024-04-30 NOTE — Anesthesia Postprocedure Evaluation (Signed)
 Anesthesia Post Note  Patient: Alison [name]  Procedure(s) Performed: EGD (ESOPHAGOGASTRODUODENOSCOPY)     Patient location during evaluation: PACU Anesthesia Type: MAC Level of consciousness: awake and alert Pain management: pain level controlled Vital Signs Assessment: post-procedure vital signs reviewed and stable Respiratory status: spontaneous breathing, nonlabored ventilation, respiratory function stable and patient connected to nasal cannula oxygen Cardiovascular status: stable and blood pressure returned to baseline Postop Assessment: no apparent nausea or vomiting Anesthetic complications: no   No notable events documented.  Last Vitals:  Vitals:   04/30/24 1120 04/30/24 1130  BP: 99/63 126/74  Pulse: 63 63  Resp: 15 (!) 22  Temp:    SpO2: 100% 99%    Last Pain:  Vitals:   04/30/24 1130  TempSrc:   PainSc: 0-No pain                 Theotis Flake P Sky Borboa

## 2024-04-30 NOTE — Op Note (Signed)
 [facility] Patient Name: Alison [name] Procedure Date : 04/30/2024 MRN: [id number] Attending MD: Alison [name] , , [id number] Date of Birth: 09/09/1961 CSN: [id number] Age: 63 Admit Type: Outpatient Procedure:                Upper GI endoscopy Indications:              Follow-up of gastric polyps Providers:                Jerrilyn Moras "Anastacio Balm" Johnsie Nani, RN, Arlin Benes, Technician, Sheria Dills, CRNA Referring MD:             Rudene Corti. Jonelle Neri MD Medicines:                Monitored Anesthesia Care Complications:            No immediate complications. Estimated Blood Loss:     Estimated blood loss was minimal. Procedure:                Pre-Anesthesia Assessment:                           - Prior to the procedure, a History and Physical                            was performed, and patient medications and                            allergies were reviewed. The patient's tolerance of                            previous anesthesia was also reviewed. The risks                            and benefits of the procedure and the sedation                            options and risks were discussed with the patient.                            All questions were answered, and informed consent                            was obtained. Prior Anticoagulants: The patient has                            taken Eliquis  (apixaban ), last dose was 2 days                            prior to procedure. ASA Grade Assessment: III - A                            patient with severe systemic disease. After  reviewing the risks and benefits, the patient was                            deemed in satisfactory condition to undergo the                            procedure.                           After obtaining informed consent, the endoscope was                            passed under direct vision. Throughout the                             procedure, the patient's blood pressure, pulse, and                            oxygen saturations were monitored continuously. The                            GIF-H190 ([id number]) Olympus endoscope was introduced                            through the mouth, and advanced to the second part                            of duodenum. The upper GI endoscopy was                            accomplished without difficulty. The patient                            tolerated the procedure well. Scope In: Scope Out: Findings:      LA Grade B (one or more mucosal breaks greater than 5 mm, not extending       between the tops of two mucosal folds) esophagitis with bleeding was       found in the distal esophagus.      A large hiatal hernia was present.      Evidence of a sleeve gastrectomy was found in the gastric body. This was       characterized by healthy appearing mucosa.      One 6 mm sessile polyp with no bleeding and no stigmata of recent       bleeding was found in the gastric body. The polyp was removed with a       cold snare. Resection and retrieval were complete.      The examined duodenum was normal. Impression:               - LA Grade B reflux esophagitis with bleeding.                           - Large hiatal hernia.                           -  A sleeve gastrectomy was found, characterized by                            healthy appearing mucosa.                           - One gastric polyp. Resected and retrieved.                           - Normal examined duodenum. Recommendation:           - Discharge patient to home (with escort).                           - Await pathology results.                           - Please continue your pantoprazole  40 mg BID and                            famotidine  as prescribed.                           - Use sucralfate suspension 1 gram PO QID for 4                            weeks.                           - Return to GI clinic in 2-3 months.                            - The findings and recommendations were discussed                            with the patient. Procedure Code(s):        --- Professional ---                           [phone], Esophagogastroduodenoscopy, flexible,                            transoral; with removal of tumor(s), polyp(s), or                            other lesion(s) by snare technique Diagnosis Code(s):        --- Professional ---                           K21.01, Gastro-esophageal reflux disease with                            esophagitis, with bleeding                           K44.9, Diaphragmatic hernia without obstruction or  gangrene                           Z98.84, Bariatric surgery status                           K31.7, Polyp of stomach and duodenum CPT copyright 2022 American Medical Association. All rights reserved. The codes documented in this report are preliminary and upon coder review may  be revised to meet current compliance requirements. Dr Alison [name] "Anastacio Balm" [name],  04/30/2024 11:02:46 AM Number of Addenda: 0

## 2024-04-30 NOTE — Anesthesia Preprocedure Evaluation (Addendum)
 Anesthesia Evaluation  Patient identified by MRN, date of birth, ID band Patient awake    Reviewed: Allergy & Precautions, NPO status , Patient's Chart, lab work & pertinent test results  Airway Mallampati: II  TM Distance: >3 FB Neck ROM: Full    Dental no notable dental hx.    Pulmonary asthma    Pulmonary exam normal        Cardiovascular hypertension, Pt. on medications  Rhythm:Regular Rate:Normal     Neuro/Psych   Anxiety     TIA   GI/Hepatic Neg liver ROS, hiatal hernia,GERD  Medicated,,  Endo/Other  Hypothyroidism    Renal/GU negative Renal ROS  negative genitourinary   Musculoskeletal  (+) Arthritis , Osteoarthritis,    Abdominal Normal abdominal exam  (+)   Peds  Hematology Lab Results      Component                Value               Date                      WBC                      5.6                 12/20/2023                HGB                      14.2                12/20/2023                HCT                      41.9                12/20/2023                MCV                      88.2                12/20/2023                PLT                      185                 12/20/2023             Lab Results      Component                Value               Date                      NA                       138                 12/20/2023                K  3.4 (L)             12/20/2023                CO2                      28                  12/20/2023                GLUCOSE                  72                  12/20/2023                BUN                      7 (L)               12/20/2023                CREATININE               0.82                12/20/2023                CALCIUM                   9.8                 12/20/2023                GFR                      93.81               11/28/2023                GFRNONAA                 >60                 12/20/2023               Anesthesia Other Findings   Reproductive/Obstetrics                             Anesthesia Physical Anesthesia Plan  ASA: 2  Anesthesia Plan: MAC   Post-op Pain Management:    Induction: Intravenous  PONV Risk Score and Plan: 2 and Treatment may vary due to age or medical condition  Airway Management Planned: Simple Face Mask and Nasal Cannula  Additional Equipment: None  Intra-op Plan:   Post-operative Plan:   Informed Consent: I have reviewed the patients History and Physical, chart, labs and discussed the procedure including the risks, benefits and alternatives for the proposed anesthesia with the patient or authorized representative who has indicated his/her understanding and acceptance.     Dental advisory given  Plan Discussed with: CRNA  Anesthesia Plan Comments:        Anesthesia Quick Evaluation

## 2024-04-30 NOTE — Discharge Instructions (Signed)

## 2024-04-30 NOTE — Transfer of Care (Signed)
 Immediate Anesthesia Transfer of Care Note  Patient: Alison [name]  Procedure(s) Performed: EGD (ESOPHAGOGASTRODUODENOSCOPY)  Patient Location: PACU  Anesthesia Type:MAC  Level of Consciousness: sedated  Airway & Oxygen Therapy: Patient Spontanous Breathing  Post-op Assessment: Report given to RN  Post vital signs: Reviewed and stable  Last Vitals:  Vitals Value Taken Time  BP 120/64   Temp    Pulse 77 04/30/24 1101  Resp 18 04/30/24 1101  SpO2 100 % 04/30/24 1101  Vitals shown include unfiled device data.  Last Pain:  Vitals:   04/30/24 0948  TempSrc: Temporal  PainSc: 0-No pain         Complications: No notable events documented.

## 2024-04-30 NOTE — H&P (Signed)
 GASTROENTEROLOGY PROCEDURE H&P NOTE   Primary Care Physician: Luevenia Saha, MD    Reason for Procedure:   History of hyperplastic gastric polyp  Plan:    EGD  Patient is appropriate for endoscopic procedure(s) in the ambulatory (hospital) setting.  The nature of the procedure, as well as the risks, benefits, and alternatives were carefully and thoroughly reviewed with the patient. Ample time for discussion and questions allowed. The patient understood, was satisfied, and agreed to proceed.     HPI: Alison [name] is a 63 y.o. female who presents for EGD for history of hyperplastic gastric polyp.  Past Medical History:  Diagnosis Date   Anxiety    Arthritis    LEFT KNEE   Asthma    Cataract    Cervical myelopathy (HCC) 08/28/2018   GERD (gastroesophageal reflux disease)    Heart murmur    Hiatal hernia    Hx-TIA (transient ischemic attack) 1982   Hyperlipidemia    Hypertension    not on meds for 2 mos at preop on 02/13/23   Hypothyroidism    Obesity    Pneumonia    Polycystic ovarian syndrome    TIA (transient ischemic attack)    Birth Control induced per pt 1982    Past Surgical History:  Procedure Laterality Date   ABDOMINAL HYSTERECTOMY  2009   Partial   ANTERIOR CERVICAL DECOMPRESSION/DISCECTOMY FUSION 4 LEVELS N/A 08/28/2018   Procedure: ANTERIOR CERVICAL DECOMPRESSION FUSION, CERVICAL 4-5, CERVICAL 5-6, CERVICAL 6-7 WITH INSTRUMENTATION AND ALLOGRAFT;  Surgeon: Virl Grimes, MD;  Location: [facility] OR;  Service: Orthopedics;  Laterality: N/A;   BREAST LUMPECTOMY Left 06/27/2020   Procedure: LEFT BREAST LUMPECTOMY;  Surgeon: Oza Blumenthal, MD;  Location: [facility] OR;  Service: General;  Laterality: Left;  LMA   DIAGNOSTIC LAPAROSCOPY     FEMUR FRACTURE SURGERY  2008   Left   FRACTURE SURGERY     I & D EXTREMITY Left 06/01/2018   Procedure: IRRIGATION AND DEBRIDEMENT LEFT CALF;  Surgeon: Arnie Lao, MD;  Location: [facility] OR;  Service: Orthopedics;   Laterality: Left;   KNEE CARTILAGE SURGERY  2005   LAPAROSCOPIC GASTRIC BAND REMOVAL WITH LAPAROSCOPIC GASTRIC SLEEVE RESECTION N/A 05/03/2020   Procedure: LAPAROSCOPIC GASTRIC BAND REMOVAL WITH LAPAROSCOPIC GASTRIC SLEEVE RESECTION; UPPER ENDO AND ERAS PATHWAY;  Surgeon: Kinsinger, Alphonso Aschoff, MD;  Location: [facility] ORS;  Service: General;  Laterality: N/A;   LAPAROSCOPIC GASTRIC BANDING  11/12/2011   Procedure: LAPAROSCOPIC GASTRIC BANDING;  Surgeon: Gorman Laughter, DO;  Location: [facility];  Service: General;  Laterality: N/A;  nathanson liver retractor/ endostitch available 2-0 surgidac refills/ layton needle drivers available/ [facility]er[facility] Doctor to [facility]ctor   TONSILLECTOMY  2007   TOTAL KNEE ARTHROPLASTY Left 10/31/2022   Procedure: TOTAL KNEE ARTHROPLASTY;  Surgeon: Murleen Arms, MD;  Location: [facility];  Service: Orthopedics;  Laterality: Left;   TOTAL KNEE ARTHROPLASTY Right 02/25/2023   Procedure: TOTAL KNEE ARTHROPLASTY;  Surgeon: Murleen Arms, MD;  Location: [facility];  Service: Orthopedics;  Laterality: Right;    Prior to Admission medications   Medication Sig Start Date End Date Taking? Authorizing Provider  acetaminophen  (TYLENOL ) 325 MG tablet Take 2 tablets (650 mg total) by mouth every 6 (six) hours as needed for mild pain (pain score 1-3) (or Fever >/= 101). 11/21/23   Aura Leeds Latif, DO  albuterol  (VENTOLIN  HFA) 108 (90 Base) MCG/ACT inhaler Inhale 2 puffs into the lungs every 6 (six) hours as needed. For  asthma 06/22/22   Luevenia Saha, MD  ALPRAZolam  (XANAX ) 0.25 MG tablet TAKE 1 TABLET BY MOUTH AT BEDTIME AS NEEDED FOR ANXIETY OR SLEEP. 12/19/23   Luevenia Saha, MD  Blood Pressure Monitoring Soln KIT  04/16/23   [provider]  calcium  carbonate (TUMS - DOSED IN MG ELEMENTAL CALCIUM ) 500 MG chewable tablet Chew 3 tablets by mouth 3 (three) times daily as needed for indigestion or heartburn.    [provider]  ELIQUIS  5 MG TABS tablet Take 1 tablet (5  mg total) by mouth 2 (two) times daily. 12/20/23   Pasam, Gale Jude, MD  escitalopram  (LEXAPRO ) 10 MG tablet TAKE 1 TABLET BY MOUTH EVERY DAY 09/25/23   Luevenia Saha, MD  famotidine  (PEPCID ) 20 MG tablet TAKE 1 TABLET BY MOUTH EVERYDAY AT BEDTIME 03/02/24   Zehr, Jessica D, PA-C  hydrochlorothiazide  (MICROZIDE ) 12.5 MG capsule TAKE 1 CAPSULE BY MOUTH EVERY DAY 03/29/24   Webb, Padonda B, FNP  levothyroxine  (SYNTHROID ) 112 MCG tablet Take 1 tablet (112 mcg total) by mouth daily. 01/13/24   Luevenia Saha, MD  Multiple Vitamin (MULTIVITAMIN) capsule Take 1 capsule by mouth daily.    [provider]  pantoprazole  (PROTONIX ) 40 MG tablet Take 1 tablet (40 mg total) by mouth 2 (two) times daily. 12/27/23   Daina Drum, MD  rOPINIRole  (REQUIP ) 3 MG tablet Take 1 tablet (3 mg total) by mouth at bedtime. 06/24/23   Luevenia Saha, MD  rosuvastatin  (CRESTOR ) 40 MG tablet Take 1 tablet (40 mg total) by mouth at bedtime. 07/26/23   Luevenia Saha, MD  traZODone  (DESYREL ) 50 MG tablet Take 0.5-1 tablets (25-50 mg total) by mouth at bedtime as needed for sleep. 01/07/24   Luevenia Saha, MD    No current facility-administered medications for this encounter.    Allergies as of 03/05/2024 - Review Complete 01/07/2024  Allergen Reaction Noted   Cherry Hives and Shortness Of Breath 08/27/2018   Other Hives and Itching 08/27/2018   Nsaids Other (See Comments) 02/25/2023   Oxycodone  Itching 12/20/2023   Soy allergy (obsolete) Hives 08/27/2018   Penicillins Itching 12/20/2023    Family History  Problem Relation Age of Onset   Colon polyps Mother    Glaucoma Mother    Hyperthyroidism Mother    Heart disease Father    Stroke Father    Anesthesia problems Sister    Migraines Sister    Cancer Maternal Aunt        Breast Cancer   Breast cancer Maternal Aunt 37   Ovarian cancer Maternal Grandmother    Uterine cancer Maternal Grandmother    Myasthenia gravis Paternal Grandfather    Colon cancer  Neg Hx    Esophageal cancer Neg Hx    Rectal cancer Neg Hx    Stomach cancer Neg Hx     Social History   Socioeconomic History   Marital status: Single    Spouse name: Not on file   Number of children: Not on file   Years of education: Not on file   Highest education level: Not on file  Occupational History   Not on file  Tobacco Use   Smoking status: Never   Smokeless tobacco: Never  Vaping Use   Vaping status: Never Used  Substance and Sexual Activity   Alcohol  use: Not Currently    Comment: rare   Drug use: No   Sexual activity: Not Currently  Other Topics Concern   Not  on file  Social History Narrative   Not on file   Social Drivers of Health   Financial Resource Strain: Not on file  Food Insecurity: No Food Insecurity (11/19/2023)   Hunger Vital Sign    Worried About Running Out of Food in the Last Year: Never true    Ran Out of Food in the Last Year: Never true  Transportation Needs: No Transportation Needs (11/19/2023)   PRAPARE - Administrator, Civil Service (Medical): No    Lack of Transportation (Non-Medical): No  Physical Activity: Not on file  Stress: Not on file  Social Connections: Not on file  Intimate Partner Violence: Not At Risk (11/19/2023)   Humiliation, Afraid, Rape, and Kick questionnaire    Fear of Current or Ex-Partner: No    Emotionally Abused: No    Physically Abused: No    Sexually Abused: No    Physical Exam: Vital signs in last 24 hours: There were no vitals taken for this visit. GEN: NAD EYE: Sclerae anicteric ENT: MMM CV: Non-tachycardic Pulm: No increased work of breathing GI: Soft, NT/ND NEURO:  Alert & Oriented   Regino Caprio, MD [facility]  04/30/2024 9:44 AM

## 2024-05-01 ENCOUNTER — Ambulatory Visit: Payer: Self-pay | Admitting: Internal Medicine

## 2024-05-01 LAB — SURGICAL PATHOLOGY

## 2024-05-03 ENCOUNTER — Encounter (HOSPITAL_COMMUNITY): Payer: Self-pay | Admitting: Internal Medicine

## 2024-05-05 NOTE — Telephone Encounter (Signed)
 Mailed procedure results to patient

## 2024-05-22 ENCOUNTER — Inpatient Hospital Stay: Payer: No Typology Code available for payment source | Attending: Oncology

## 2024-05-22 ENCOUNTER — Inpatient Hospital Stay: Payer: No Typology Code available for payment source | Admitting: Oncology

## 2024-05-22 ENCOUNTER — Encounter: Payer: Self-pay | Admitting: Oncology

## 2024-05-22 VITALS — BP 129/68 | HR 63 | Temp 98.0°F | Resp 17 | Ht 62.0 in | Wt 180.3 lb

## 2024-05-22 DIAGNOSIS — Z91018 Allergy to other foods: Secondary | ICD-10-CM | POA: Insufficient documentation

## 2024-05-22 DIAGNOSIS — K449 Diaphragmatic hernia without obstruction or gangrene: Secondary | ICD-10-CM | POA: Insufficient documentation

## 2024-05-22 DIAGNOSIS — I2699 Other pulmonary embolism without acute cor pulmonale: Secondary | ICD-10-CM

## 2024-05-22 DIAGNOSIS — E860 Dehydration: Secondary | ICD-10-CM | POA: Diagnosis not present

## 2024-05-22 DIAGNOSIS — I10 Essential (primary) hypertension: Secondary | ICD-10-CM | POA: Insufficient documentation

## 2024-05-22 DIAGNOSIS — Z886 Allergy status to analgesic agent status: Secondary | ICD-10-CM | POA: Diagnosis not present

## 2024-05-22 DIAGNOSIS — N6311 Unspecified lump in the right breast, upper outer quadrant: Secondary | ICD-10-CM | POA: Insufficient documentation

## 2024-05-22 DIAGNOSIS — I350 Nonrheumatic aortic (valve) stenosis: Secondary | ICD-10-CM | POA: Insufficient documentation

## 2024-05-22 DIAGNOSIS — Z86718 Personal history of other venous thrombosis and embolism: Secondary | ICD-10-CM | POA: Diagnosis not present

## 2024-05-22 DIAGNOSIS — Z7901 Long term (current) use of anticoagulants: Secondary | ICD-10-CM | POA: Diagnosis not present

## 2024-05-22 DIAGNOSIS — Z885 Allergy status to narcotic agent status: Secondary | ICD-10-CM | POA: Diagnosis not present

## 2024-05-22 DIAGNOSIS — E039 Hypothyroidism, unspecified: Secondary | ICD-10-CM | POA: Insufficient documentation

## 2024-05-22 DIAGNOSIS — Z880 Allergy status to penicillin: Secondary | ICD-10-CM | POA: Diagnosis not present

## 2024-05-22 DIAGNOSIS — E785 Hyperlipidemia, unspecified: Secondary | ICD-10-CM | POA: Insufficient documentation

## 2024-05-22 DIAGNOSIS — Z79899 Other long term (current) drug therapy: Secondary | ICD-10-CM | POA: Diagnosis not present

## 2024-05-22 DIAGNOSIS — I82431 Acute embolism and thrombosis of right popliteal vein: Secondary | ICD-10-CM | POA: Diagnosis not present

## 2024-05-22 DIAGNOSIS — Z86711 Personal history of pulmonary embolism: Secondary | ICD-10-CM | POA: Diagnosis not present

## 2024-05-22 DIAGNOSIS — N6310 Unspecified lump in the right breast, unspecified quadrant: Secondary | ICD-10-CM | POA: Diagnosis not present

## 2024-05-22 LAB — CBC WITH DIFFERENTIAL/PLATELET
Abs Immature Granulocytes: 0.01 10*3/uL (ref 0.00–0.07)
Basophils Absolute: 0 10*3/uL (ref 0.0–0.1)
Basophils Relative: 0 %
Eosinophils Absolute: 0.2 10*3/uL (ref 0.0–0.5)
Eosinophils Relative: 3 %
HCT: 40.9 % (ref 36.0–46.0)
Hemoglobin: 14.1 g/dL (ref 12.0–15.0)
Immature Granulocytes: 0 %
Lymphocytes Relative: 34 %
Lymphs Abs: 1.9 10*3/uL (ref 0.7–4.0)
MCH: 30.5 pg (ref 26.0–34.0)
MCHC: 34.5 g/dL (ref 30.0–36.0)
MCV: 88.5 fL (ref 80.0–100.0)
Monocytes Absolute: 0.4 10*3/uL (ref 0.1–1.0)
Monocytes Relative: 7 %
Neutro Abs: 3 10*3/uL (ref 1.7–7.7)
Neutrophils Relative %: 56 %
Platelets: 203 10*3/uL (ref 150–400)
RBC: 4.62 MIL/uL (ref 3.87–5.11)
RDW: 13.1 % (ref 11.5–15.5)
WBC: 5.5 10*3/uL (ref 4.0–10.5)
nRBC: 0 % (ref 0.0–0.2)

## 2024-05-22 LAB — D-DIMER, QUANTITATIVE: D-Dimer, Quant: 0.45 ug{FEU}/mL (ref 0.00–0.50)

## 2024-05-22 NOTE — Progress Notes (Signed)
 [facility]  HEMATOLOGY CLINIC PROGRESS NOTE  PATIENT NAME: Alison [name]   MR#: [id number] DOB: 1961/10/20  Patient Care Team: Luevenia Saha, MD as PCP - General (Family Medicine) Nahser, Lela Purple, MD as PCP - Cardiology (Cardiology) Arlo Berber, MD as Consulting Physician (Oncology)  Date of visit: 05/22/2024   ASSESSMENT & PLAN:   Alison [name] is a 63 y.o. lady with a past medical history of hypertension, hypothyroidism, dyslipidemia, was referred to our service for evaluation of possible hypercoagulable state, given her history of multiple pulmonary emboli diagnosed in December 2024.   Grossly negative hypercoagulable workup.  Acute pulmonary embolism without acute cor pulmonale (HCC) -Currently on Eliquis  5 mg twice daily. No symptoms of pain or respiratory distress. Discussed importance of continuing anticoagulation therapy for at least six months to prevent recurrence and severe complications.   -Long road trips and dehydration may have contributed to clot formation. Emphasized adherence to medication, hydration, and taking breaks during long trips.   Given the degree of thromboembolism, it would be prudent to rule out underlying hypercoagulable state.  Hence we pursued hypercoagulable workup on 12/20/2023 during her initial consultation with us .  It was grossly unremarkable without evidence of factor V Leiden mutation, prothrombin gene mutation, anticardiolipin antibodies, beta-2  glycoprotein antibodies or lupus anticoagulant.    Protein C activity, protein S activity, Antithrombin III activity were deferred previously, since they can be falsely abnormal given recent thromboembolism.  We did check them today, 05/22/2024.  Will follow-up on the results.    D-dimer was normal at 0.41 on 12/20/23 and again today at 0.45, indicating adequate anticoagulation.  It was increased at 5.86 in December 2024.   Will follow-up on results of remaining hypercoagulable workup.   If all of this is unremarkable, patient would prefer to come off of anticoagulation and remain on close surveillance.  We will update patient with results.  If tests are normal, discontinue Eliquis  after current supply is finished. - Monitor for symptoms of clot recurrence, including pain, redness, swelling, trouble breathing, chest pain, back pain, or stroke-like symptoms. - Schedule follow-up in four months to reassess and determine if discharge or annual follow-up is appropriate.  Acute deep vein thrombosis (DVT) of right popliteal vein (HCC) DVT in the right lower leg confirmed via imaging. No symptoms such as swelling, redness, or pain. Prolonged immobility during a long drive (6 hours) may have contributed to clot formation.   Discussed importance of hydration and taking breaks during long trips to prevent recurrence.  Recommendations regarding anticoagulation as above.   - Monitor for any new symptoms of DVT  Mass of right breast Right breast lesion identified on CT scan in December 2024. History of benign left breast lumpectomy in 2021. Delays in obtaining diagnostic imaging due to medical records transfer issues. No palpable mass detected.   She eventually had diagnostic bilateral mammogram on 12/23/2023.  It showed benign, stable mass in the upper outer right breast, corresponding to abnormality noted on her recent chest CT.  This corresponds to a group of cysts noted on diagnostic imaging and ultrasound on 04/15/2020.  BI-RADS 2, benign finding.   I spent a total of 25 minutes during this encounter with the patient including review of chart and various tests results, discussions about plan of care and coordination of care plan.  I reviewed lab results and outside records for this visit and discussed relevant results with the patient. Diagnosis, plan of care and treatment options were  also discussed in detail with the patient. Opportunity provided to ask questions and answers provided  to her apparent satisfaction. Provided instructions to call our clinic with any problems, questions or concerns prior to return visit. I recommended to continue follow-up with PCP and sub-specialists. She verbalized understanding and agreed with the plan. No barriers to learning was detected.  Arlo Berber, MD  05/22/2024 2:59 PM  [facility] - A DEPT OF [facility] [address] [place] [address] Dept: [phone] Dept Fax: [phone]   CHIEF COMPLAINT/ REASON FOR VISIT:  Follow-up for history of PE and DVT in December 2024, likely provoked from long road travel versus COVID-vaccine.  Grossly negative hypercoagulable workup.  INTERVAL HISTORY:  Discussed the use of AI scribe software for clinical note transcription with the patient, who gave verbal consent to proceed.  History of Present Illness Alison [name] is a 63 year old female with deep vein thrombosis who presents for follow-up of her blood clotting status.  She has no current symptoms of shortness of breath or leg pain. She is confused about her previous diagnosis of DVT, noting that her legs appear normal and she has not experienced any pain. Her D-dimer levels have significantly decreased from 5.86 in December to 0.45, which is within the normal range. She recalls a road trip to [facility] as a potential trigger for the DVT but suspects the Moderna COVID vaccine may have contributed. She maintains an active lifestyle, walking her dog daily and cutting grass, and questions the likelihood of the road trip causing the clot.  She has been taking Eliquis  but is concerned about the prospect of lifelong use. She is allergic to aspirin, which causes anaphylaxis, and therefore cannot use it as an alternative. She wears compression socks for work, where she spends long hours at the computer, and uses a small bike under her desk to stay active.  She has a history of  hiatal hernia, which has worsened, and a benign polyp was found during her recent endoscopy. Her last colonoscopy was in November 2016, and she acknowledges it will be due next year.  She has aortic stenosis, which was evaluated since her last visit. She reports no current issues or symptoms related to this condition.    SUMMARY OF HEMATOLOGIC HISTORY:  She presented to our clinic after a diagnosis of pulmonary emboli and deep vein thrombosis (DVT) in her right lower leg, diagnosed in December 2024. The patient experienced severe pain in her side and difficulty breathing while on a trip to [facility], [place], which led to her hospitalization and diagnosis. She reports no current symptoms related to the DVT, including swelling, redness, or pain in the leg. She is currently on Eliquis  for anticoagulation and has been compliant with her medication.   In addition to her DVT and pulmonary emboli, the patient also mentions a right breast lesion detected on a CT scan during her hospitalization. She has been trying to get a diagnostic mammogram scheduled but has faced delays due to issues with medical records transfer. She has a history of a benign lumpectomy in her left breast two years ago.   The patient also has a history of knee replacements, with the most recent one done in March 2024. She reports no current issues with her knees. She also has a hiatal hernia, for which she takes Pepcid  and Tums, and is due for a follow-up endoscopy in February.  Of note, she drove from [place] all the way to [facility], [place] just prior to the diagnosis of DVT and pulmonary emboli, which is about 5 and half hours of drive, without taking any breaks.   Long road trip and dehydration may have contributed to clot formation. Emphasized adherence to medication, hydration, and taking breaks during long trips. Continue wearing compression socks.   Given the degree of thromboembolism, it would be prudent to rule out underlying  hypercoagulable state.  Hence we pursued hypercoagulable workup on 12/20/2023 during her initial consultation with us .  It was grossly unremarkable without evidence of factor V Leiden mutation, prothrombin gene mutation, anticardiolipin antibodies, beta-2  glycoprotein antibodies or lupus anticoagulant.     Protein C activity, protein S activity, Antithrombin III activity were deferred, since they can be falsely abnormal given recent thromboembolism.   Continue Eliquis  5 mg twice daily for six months   D-dimer was normal at 0.41 on 12/20/23 and on 0.45 on 05/22/24, indicating adequate anticoagulation.  We did check protein C activity, protein S activity, Antithrombin III activity on 05/22/2024.  If within normal limits, patient would like to go off of anticoagulation with close monitoring.     Mass of right breast Right breast lesion identified on CT scan in December 2024. History of benign left breast lumpectomy in 2021. Delays in obtaining diagnostic imaging due to medical records transfer issues. No palpable mass detected.    She eventually had diagnostic bilateral mammogram on 12/23/2023.  It showed benign, stable mass in the upper outer right breast, corresponding to abnormality noted on her recent chest CT.  This corresponds to a group of cysts noted on diagnostic imaging and ultrasound on 04/15/2020.  BI-RADS 2, benign finding.  I have reviewed the past medical history, past surgical history, social history and family history with the patient and they are unchanged from previous note.  ALLERGIES: She is allergic to aspirin, cherry, other, nsaids, oxycodone , soy allergy (obsolete), and penicillins.  MEDICATIONS:  Current Outpatient Medications  Medication Sig Dispense Refill   acetaminophen  (TYLENOL ) 325 MG tablet Take 2 tablets (650 mg total) by mouth every 6 (six) hours as needed for mild pain (pain score 1-3) (or Fever >/= 101). 20 tablet 0   albuterol  (VENTOLIN  HFA) 108 (90 Base) MCG/ACT  inhaler Inhale 2 puffs into the lungs every 6 (six) hours as needed. For asthma 18 g 2   ALPRAZolam  (XANAX ) 0.25 MG tablet TAKE 1 TABLET BY MOUTH AT BEDTIME AS NEEDED FOR ANXIETY OR SLEEP. 20 tablet 0   Blood Pressure Monitoring Soln KIT      calcium  carbonate (TUMS - DOSED IN MG ELEMENTAL CALCIUM ) 500 MG chewable tablet Chew 3 tablets by mouth 3 (three) times daily as needed for indigestion or heartburn.     ELIQUIS  5 MG TABS tablet Take 1 tablet (5 mg total) by mouth 2 (two) times daily. 60 tablet 4   escitalopram  (LEXAPRO ) 10 MG tablet TAKE 1 TABLET BY MOUTH EVERY DAY 90 tablet 3   famotidine  (PEPCID ) 20 MG tablet TAKE 1 TABLET BY MOUTH EVERYDAY AT BEDTIME 90 tablet 0   hydrochlorothiazide  (MICROZIDE ) 12.5 MG capsule TAKE 1 CAPSULE BY MOUTH EVERY DAY 90 capsule 3   levothyroxine  (SYNTHROID ) 112 MCG tablet Take 1 tablet (112 mcg total) by mouth daily. 90 tablet 3   Multiple Vitamin (MULTIVITAMIN) capsule Take 1 capsule by mouth daily.     pantoprazole  (PROTONIX ) 40 MG tablet Take 1 tablet (40 mg total) by mouth 2 (two)  times daily. 180 tablet 1   rOPINIRole  (REQUIP ) 3 MG tablet Take 1 tablet (3 mg total) by mouth at bedtime. 90 tablet 3   rosuvastatin  (CRESTOR ) 40 MG tablet Take 1 tablet (40 mg total) by mouth at bedtime. 90 tablet 3   sucralfate  (CARAFATE ) 1 g tablet Take 1 tablet (1 g total) by mouth 4 (four) times daily -  with meals and at bedtime. 120 tablet 2   traZODone  (DESYREL ) 50 MG tablet Take 0.5-1 tablets (25-50 mg total) by mouth at bedtime as needed for sleep. 90 tablet 3   No current facility-administered medications for this visit.     REVIEW OF SYSTEMS:    Review of Systems - Oncology  All other pertinent systems were reviewed with the patient and are negative.  PHYSICAL EXAMINATION:   Onc Performance Status - 05/22/24 1100       ECOG Perf Status   ECOG Perf Status Fully active, able to carry on all pre-disease performance without restriction      KPS SCALE   KPS  % SCORE Normal, no compliants, no evidence of disease          Vitals:   05/22/24 1128  BP: 129/68  Pulse: 63  Resp: 17  Temp: 98 F (36.7 C)  SpO2: 100%   Filed Weights   05/22/24 1128  Weight: 180 lb 4.8 oz (81.8 kg)    Physical Exam Constitutional:      General: She is not in acute distress.    Appearance: Normal appearance.  HENT:     Head: Normocephalic and atraumatic.   Cardiovascular:     Rate and Rhythm: Normal rate.     Heart sounds: Murmur heard.  Pulmonary:     Effort: Pulmonary effort is normal. No respiratory distress.     Breath sounds: Normal breath sounds.  Abdominal:     General: There is no distension.   Neurological:     General: No focal deficit present.     Mental Status: She is alert and oriented to person, place, and time.   Psychiatric:        Mood and Affect: Mood normal.        Behavior: Behavior normal.     LABORATORY DATA:   I have reviewed the data as listed.  Results for orders placed or performed in visit on 05/22/24  D-dimer, quantitative  Result Value Ref Range   D-Dimer, Quant 0.45 0.00 - 0.50 ug/mL-FEU  CBC with Differential/Platelet  Result Value Ref Range   WBC 5.5 4.0 - 10.5 K/uL   RBC 4.62 3.87 - 5.11 MIL/uL   Hemoglobin 14.1 12.0 - 15.0 g/dL   HCT 40.9 81.1 - 91.4 %   MCV 88.5 80.0 - 100.0 fL   MCH 30.5 26.0 - 34.0 pg   MCHC 34.5 30.0 - 36.0 g/dL   RDW 78.2 95.6 - 21.3 %   Platelets 203 150 - 400 K/uL   nRBC 0.0 0.0 - 0.2 %   Neutrophils Relative % 56 %   Neutro Abs 3.0 1.7 - 7.7 K/uL   Lymphocytes Relative 34 %   Lymphs Abs 1.9 0.7 - 4.0 K/uL   Monocytes Relative 7 %   Monocytes Absolute 0.4 0.1 - 1.0 K/uL   Eosinophils Relative 3 %   Eosinophils Absolute 0.2 0.0 - 0.5 K/uL   Basophils Relative 0 %   Basophils Absolute 0.0 0.0 - 0.1 K/uL   Immature Granulocytes 0 %   Abs Immature Granulocytes 0.01  0.00 - 0.07 K/uL     RADIOGRAPHIC STUDIES:  No recent pertinent imaging studies available to  review.  No orders of the defined types were placed in this encounter.    Future Appointments  Date Time Provider Department Center  07/07/2024  9:30 AM Luevenia Saha, MD [facility]  08/28/2024 10:30 AM Swinyer, Leilani Punter, NP [facility]  10/09/2024 10:00 AM [facility] None  10/09/2024 10:30 AM Ellyanna Holton, Gale Jude, MD [facility] None     This document was completed utilizing speech recognition software. Grammatical errors, random word insertions, pronoun errors, and incomplete sentences are an occasional consequence of this system due to software limitations, ambient noise, and hardware issues. Any formal questions or concerns about the content, text or information contained within the body of this dictation should be directly addressed to the provider for clarification.

## 2024-05-22 NOTE — Assessment & Plan Note (Addendum)
-  Currently on Eliquis  5 mg twice daily. No symptoms of pain or respiratory distress. Discussed importance of continuing anticoagulation therapy for at least six months to prevent recurrence and severe complications.   -Long road trips and dehydration may have contributed to clot formation. Emphasized adherence to medication, hydration, and taking breaks during long trips.   Given the degree of thromboembolism, it would be prudent to rule out underlying hypercoagulable state.  Hence we pursued hypercoagulable workup on 12/20/2023 during her initial consultation with us .  It was grossly unremarkable without evidence of factor V Leiden mutation, prothrombin gene mutation, anticardiolipin antibodies, beta-2  glycoprotein antibodies or lupus anticoagulant.    Protein C activity, protein S activity, Antithrombin III activity were deferred previously, since they can be falsely abnormal given recent thromboembolism.  We did check them today, 05/22/2024.  Will follow-up on the results.    D-dimer was normal at 0.41 on 12/20/23 and again today at 0.45, indicating adequate anticoagulation.  It was increased at 5.86 in December 2024.   Will follow-up on results of remaining hypercoagulable workup.  If all of this is unremarkable, patient would prefer to come off of anticoagulation and remain on close surveillance.  We will update patient with results.  If tests are normal, discontinue Eliquis  after current supply is finished. - Monitor for symptoms of clot recurrence, including pain, redness, swelling, trouble breathing, chest pain, back pain, or stroke-like symptoms. - Schedule follow-up in four months to reassess and determine if discharge or annual follow-up is appropriate.

## 2024-05-22 NOTE — Assessment & Plan Note (Addendum)
 DVT in the right lower leg confirmed via imaging. No symptoms such as swelling, redness, or pain. Prolonged immobility during a long drive (6 hours) may have contributed to clot formation.   Discussed importance of hydration and taking breaks during long trips to prevent recurrence.  Recommendations regarding anticoagulation as above.   - Monitor for any new symptoms of DVT

## 2024-05-22 NOTE — Assessment & Plan Note (Signed)
 Right breast lesion identified on CT scan in December 2024. History of benign left breast lumpectomy in 2021. Delays in obtaining diagnostic imaging due to medical records transfer issues. No palpable mass detected.   She eventually had diagnostic bilateral mammogram on 12/23/2023.  It showed benign, stable mass in the upper outer right breast, corresponding to abnormality noted on her recent chest CT.  This corresponds to a group of cysts noted on diagnostic imaging and ultrasound on 04/15/2020.  BI-RADS 2, benign finding.

## 2024-05-23 LAB — PROTEIN S PANEL
Protein S Activity: 70 % (ref 63–140)
Protein S Ag, Free: 96 % (ref 61–136)
Protein S Ag, Total: 81 % (ref 60–150)

## 2024-05-23 LAB — ANTITHROMBIN III ANTIGEN: AT III AG PPP IMM-ACNC: 81 % (ref 72–124)

## 2024-05-23 LAB — PROTEIN C ACTIVITY: Protein C Activity: 114 % (ref 73–180)

## 2024-05-28 ENCOUNTER — Other Ambulatory Visit: Payer: Self-pay | Admitting: Gastroenterology

## 2024-05-29 ENCOUNTER — Other Ambulatory Visit: Payer: Self-pay | Admitting: Family Medicine

## 2024-06-18 ENCOUNTER — Other Ambulatory Visit: Payer: Self-pay | Admitting: Internal Medicine

## 2024-06-20 ENCOUNTER — Other Ambulatory Visit: Payer: Self-pay | Admitting: Internal Medicine

## 2024-06-20 DIAGNOSIS — K219 Gastro-esophageal reflux disease without esophagitis: Secondary | ICD-10-CM

## 2024-06-25 ENCOUNTER — Telehealth: Payer: Self-pay | Admitting: Oncology

## 2024-06-25 NOTE — Telephone Encounter (Signed)
 Rescheule appointment per provider pal.  Called left VM with changes made to the upcoming appointment.

## 2024-06-30 ENCOUNTER — Encounter: Payer: No Typology Code available for payment source | Admitting: Family Medicine

## 2024-07-07 ENCOUNTER — Ambulatory Visit (INDEPENDENT_AMBULATORY_CARE_PROVIDER_SITE_OTHER): Payer: No Typology Code available for payment source | Admitting: Family Medicine

## 2024-07-07 ENCOUNTER — Encounter: Payer: Self-pay | Admitting: Family Medicine

## 2024-07-07 VITALS — BP 127/71 | HR 67 | Temp 97.7°F | Ht 62.0 in | Wt 184.0 lb

## 2024-07-07 DIAGNOSIS — E782 Mixed hyperlipidemia: Secondary | ICD-10-CM

## 2024-07-07 DIAGNOSIS — Z8673 Personal history of transient ischemic attack (TIA), and cerebral infarction without residual deficits: Secondary | ICD-10-CM

## 2024-07-07 DIAGNOSIS — Z903 Acquired absence of stomach [part of]: Secondary | ICD-10-CM

## 2024-07-07 DIAGNOSIS — G2581 Restless legs syndrome: Secondary | ICD-10-CM

## 2024-07-07 DIAGNOSIS — I10 Essential (primary) hypertension: Secondary | ICD-10-CM

## 2024-07-07 DIAGNOSIS — F411 Generalized anxiety disorder: Secondary | ICD-10-CM | POA: Diagnosis not present

## 2024-07-07 DIAGNOSIS — Z23 Encounter for immunization: Secondary | ICD-10-CM | POA: Diagnosis not present

## 2024-07-07 DIAGNOSIS — I2699 Other pulmonary embolism without acute cor pulmonale: Secondary | ICD-10-CM | POA: Diagnosis not present

## 2024-07-07 DIAGNOSIS — J4520 Mild intermittent asthma, uncomplicated: Secondary | ICD-10-CM

## 2024-07-07 DIAGNOSIS — Z0000 Encounter for general adult medical examination without abnormal findings: Secondary | ICD-10-CM

## 2024-07-07 DIAGNOSIS — Z0001 Encounter for general adult medical examination with abnormal findings: Secondary | ICD-10-CM

## 2024-07-07 DIAGNOSIS — K219 Gastro-esophageal reflux disease without esophagitis: Secondary | ICD-10-CM

## 2024-07-07 DIAGNOSIS — I82431 Acute embolism and thrombosis of right popliteal vein: Secondary | ICD-10-CM

## 2024-07-07 DIAGNOSIS — E890 Postprocedural hypothyroidism: Secondary | ICD-10-CM

## 2024-07-07 LAB — CBC WITH DIFFERENTIAL/PLATELET
Basophils Absolute: 0.1 K/uL (ref 0.0–0.1)
Basophils Relative: 0.9 % (ref 0.0–3.0)
Eosinophils Absolute: 0.2 K/uL (ref 0.0–0.7)
Eosinophils Relative: 2.8 % (ref 0.0–5.0)
HCT: 45.5 % (ref 36.0–46.0)
Hemoglobin: 15.1 g/dL — ABNORMAL HIGH (ref 12.0–15.0)
Lymphocytes Relative: 28.6 % (ref 12.0–46.0)
Lymphs Abs: 2.1 K/uL (ref 0.7–4.0)
MCHC: 33.3 g/dL (ref 30.0–36.0)
MCV: 91.5 fl (ref 78.0–100.0)
Monocytes Absolute: 0.4 K/uL (ref 0.1–1.0)
Monocytes Relative: 5.4 % (ref 3.0–12.0)
Neutro Abs: 4.6 K/uL (ref 1.4–7.7)
Neutrophils Relative %: 62.3 % (ref 43.0–77.0)
Platelets: 219 K/uL (ref 150.0–400.0)
RBC: 4.98 Mil/uL (ref 3.87–5.11)
RDW: 14.4 % (ref 11.5–15.5)
WBC: 7.3 K/uL (ref 4.0–10.5)

## 2024-07-07 LAB — COMPREHENSIVE METABOLIC PANEL WITH GFR
ALT: 15 U/L (ref 0–35)
AST: 18 U/L (ref 0–37)
Albumin: 4.3 g/dL (ref 3.5–5.2)
Alkaline Phosphatase: 98 U/L (ref 39–117)
BUN: 12 mg/dL (ref 6–23)
CO2: 27 meq/L (ref 19–32)
Calcium: 10 mg/dL (ref 8.4–10.5)
Chloride: 100 meq/L (ref 96–112)
Creatinine, Ser: 0.82 mg/dL (ref 0.40–1.20)
GFR: 76.72 mL/min (ref >60.00)
Glucose, Bld: 78 mg/dL (ref 70–99)
Potassium: 3.5 meq/L (ref 3.5–5.1)
Sodium: 137 meq/L (ref 135–145)
Total Bilirubin: 0.6 mg/dL (ref 0.2–1.2)
Total Protein: 7.2 g/dL (ref 6.0–8.3)

## 2024-07-07 LAB — LIPID PANEL
Cholesterol: 286 mg/dL — ABNORMAL HIGH (ref 0–200)
HDL: 78.6 mg/dL (ref >39.00)
LDL Cholesterol: 185 mg/dL — ABNORMAL HIGH (ref 0–99)
NonHDL: 207.35
Total CHOL/HDL Ratio: 4
Triglycerides: 112 mg/dL (ref 0.0–149.0)
VLDL: 22.4 mg/dL (ref 0.0–40.0)

## 2024-07-07 LAB — VITAMIN B12: Vitamin B-12: 418 pg/mL (ref 211–911)

## 2024-07-07 LAB — IRON,TIBC AND FERRITIN PANEL
%SAT: 29 % (ref 16–45)
Ferritin: 26 ng/mL (ref 16–288)
Iron: 109 ug/dL (ref 45–160)
TIBC: 370 ug/dL (ref 250–450)

## 2024-07-07 LAB — TSH: TSH: 8.65 u[IU]/mL — ABNORMAL HIGH (ref 0.35–5.50)

## 2024-07-07 LAB — VITAMIN D 25 HYDROXY (VIT D DEFICIENCY, FRACTURES): VITD: 27.71 ng/mL — ABNORMAL LOW (ref 30.00–100.00)

## 2024-07-07 NOTE — Patient Instructions (Signed)
 Please return in 12 months for your annual complete physical; please come fasting.   I will release your lab results to you on your MyChart account with further instructions. You may see the results before I do, but when I review them I will send you a message with my report or have my assistant call you if things need to be discussed. Please reply to my message with any questions. Thank you!   If you have any questions or concerns, please don't hesitate to send me a message via MyChart or call the office at [phone]. Thank you for visiting with us  today! It's our pleasure caring for you.   Glad you are doing so well.

## 2024-07-07 NOTE — Progress Notes (Unsigned)
 Subjective  Chief Complaint  Patient presents with   Annual Exam    Pt here for Annual exam and is currently fasting    Hypertension    HPI: Alison [name] is a 63 y.o. female who presents to [facility] [place] today for a Female Wellness Visit. She also has the concerns and/or needs as listed above in the chief complaint. These will be addressed in addition to the Health Maintenance Visit.   Wellness Visit: annual visit with health maintenance review and exam  HM: screens are current. Reports she is doing great. No new concerns. Eligible for prevnar 20 due to asthma. Chronic disease f/u and/or acute problem visit: (deemed necessary to be done in addition to the wellness visit): Chronic medical problems are very well-controlled.  She has completed a 43-month course of Eliquis  for pulmonary embolism that was provoked.  No shortness of breath or lower extremity edema.  She continues on her antihypertensives, statin, levothyroxine , chronic PPI, ropinirole  for her chronic problems including hypertension, hyperlipidemia, hypothyroidism and GERD and RLS.  All problems are well-controlled except GERD.  She is planning to have Niesen fundoplication due to chronic GERD symptoms that are active.  No nausea or hematemesis. She is doing well with mood and anxiety. Levothyroxine  dose was changed in January due to an elevated TSH.  Clinically she feels well.  She has no symptoms of high or low thyroid .  She is overdue for recheck.  She takes 112 mcg daily. She denies any recent asthma exacerbations.  Rarely uses albuterol .  Assessment  1. Encounter for well adult exam with abnormal findings   2. Other acute pulmonary embolism without acute cor pulmonale (HCC)   3. Essential hypertension   4. GAD (generalized anxiety disorder)   5. Mixed hyperlipidemia   6. History of TIA (transient ischemic attack)   7. Postablative hypothyroidism   8. Gastroesophageal reflux disease without  esophagitis   9. Mild intermittent asthma without complication   10. Restless legs syndrome (RLS)   11. S/P gastric sleeve procedure   12. Acute deep vein thrombosis (DVT) of right popliteal vein (HCC)   13. Need for pneumococcal 20-valent conjugate vaccination      Plan  Female Wellness Visit: Age appropriate Health Maintenance and Prevention measures were discussed with patient. Included topics are cancer screening recommendations, ways to keep healthy (see AVS) including dietary and exercise recommendations, regular eye and dental care, use of seat belts, and avoidance of moderate alcohol  use and tobacco use.  BMI: discussed patient's BMI and encouraged positive lifestyle modifications to help get to or maintain a target BMI. HM needs and immunizations were addressed and ordered. See below for orders. See HM and immunization section for updates.  Prevnar 20 given today Routine labs and screening tests ordered including cmp, cbc and lipids where appropriate. Discussed recommendations regarding Vit D and calcium  supplementation (see AVS)  Chronic disease management visit and/or acute problem visit: Reviewed each chronic medical problems as listed above separately.  All are clinically well-controlled.  Will recheck lab work to ensure her lipids are at goal on statin, renal function electrolytes are stable on antihypertensives, she is at risk for iron deficiency vitamin B12 and vitamin D  deficiency due to her gastric sleeve bypass.  Will check these levels today.  Continue ropinirole  for restless leg.  Chronic PPI use.  Check B12 levels for this as well.  Check TSH and adjust levothyroxine  dose if needed.  Continue statin, goal LDL less  than 70 given history of TIA.  She had a provoked DVT and PE.  No further anticoagulation warranted.  Negative coagulopathy workup.  Mild intermittent asthma is well-controlled.  Continue mood medications.  Continue healthy diet and weight loss Follow up: 12 months  for complete physical Orders Placed This Encounter  Procedures   Pneumococcal conjugate vaccine 20-valent (Prevnar 20)   TSH   VITAMIN D  25 Hydroxy (Vit-D Deficiency, Fractures)   CBC with Differential/Platelet   Comprehensive metabolic panel with GFR   Lipid panel   Vitamin B12   Iron, TIBC and Ferritin Panel   No orders of the defined types were placed in this encounter.     Body mass index is 33.65 kg/m. Wt Readings from Last 3 Encounters:  07/07/24 184 lb (83.5 kg)  05/22/24 180 lb 4.8 oz (81.8 kg)  04/30/24 177 lb (80.3 kg)     Patient Active Problem List   Diagnosis Date Noted   Acute deep vein thrombosis (DVT) of right popliteal vein (HCC) 11/28/2023    Priority: High    Provoked, 6 hour non stop car ride, 11/2023; negative anticoag workup. Treated for 6 months with eloquis    Acute pulmonary embolism without acute cor pulmonale (HCC) 11/19/2023    Priority: High    Eloquis x 6 months; long car ride and dehydration: hypercoagulable work up done 12/2023, It was grossly unremarkable without evidence of factor V Leiden mutation, prothrombin gene mutation, anticardiolipin antibodies, beta-2  glycoprotein antibodies or lupus anticoagulant.     Protein C activity, protein S activity, Antithrombin III  activity were deferred, since they can be falsely abnormal given recent thromboembolism.  We will obtain this on return visit.    Hiatal hernia 06/24/2023    Priority: High    Large, in chest by EGD 2023, Dr. Federico; pt declines surgical repair; symptomatic    Class 2 severe obesity with body mass index (BMI) of 35 to 39.9 with serious comorbidity (HCC) 03/02/2020    Priority: High   Postablative hypothyroidism 02/03/2020    Priority: High    Uncontrolled 06/2023 w/ TSH 44! On 100mcg daily; increased to 125mcg. 11/2024 adjusted again; jan 2025, TSH 11 on daily; adjusted to 112mcg daily    Systolic murmur 09/29/2019    Priority: High    Normal echo 2012;  Louder:  repeat in 2024 Bicuspid aortic valve with calcifications, mild AS    GAD (generalized anxiety disorder) 06/24/2018    Priority: High    Overview:  Did well on paxil  but stopped due to weight gain; wellbutrin - failed after 6 months. stabilized on lexapro .    Mixed hyperlipidemia 06/24/2018    Priority: High   Essential hypertension 05/31/2018    Priority: High    Hypokalemic on hydrochlorothiazide  12.5 daily.  Added potassium supplement July 2024 Nl Echo 02/2011    Family history of premature CAD 12/29/2013    Priority: High    Overview:  Father, 35 yo    History of TIA (transient ischemic attack) 12/10/1980    Priority: High    When on OCPs    Gastroesophageal reflux disease 10/11/2020    Priority: Medium    S/P gastric sleeve procedure 10/11/2020    Priority: Medium    Mild intermittent asthma without complication 06/24/2018    Priority: Medium     Overview:  Negative sleep study, 04/2011    Primary osteoarthritis of both knees 09/11/2016    Priority: Medium     Overview:  Dr. Jane  Gastric banding status 10/07/2015    Priority: Medium     Removed 2021; converted to gastric sleeve    Restless legs syndrome (RLS) 02/14/2015    Priority: Medium    Insomnia 12/29/2013    Priority: Medium     Overview:  Failed lunesta and ambien  and trazadone; Uses xanax  rarely    Status post right knee replacement 06/24/2023    Priority: Low   Status post left knee replacement 01/04/2023    Priority: Low   Gastric polyp 03/05/2024   Mass of right breast 12/20/2023   Aortic valve calcification 12/20/2023   Healthcare maintenance 12/20/2023   Hypoalbuminemia due to protein-calorie malnutrition (HCC) 11/28/2023   Subcutaneous nodule of breast 11/19/2023   Health Maintenance  Topic Date Due   Pneumococcal Vaccine 31-52 Years old (2 of 2 - PCV) 12/10/2013   COVID-19 Vaccine (7 - 2024-25 season) 07/23/2024 (Originally 08/11/2023)   INFLUENZA VACCINE  07/10/2024   MAMMOGRAM   12/22/2024   Cervical Cancer Screening (HPV/Pap Cotest)  10/11/2025   Colonoscopy  10/24/2025   DTaP/Tdap/Td (3 - Td or Tdap) 06/22/2032   Hepatitis C Screening  Completed   HIV Screening  Completed   Zoster Vaccines- Shingrix   Completed   Hepatitis B Vaccines  Aged Out   HPV VACCINES  Aged Out   Meningococcal B Vaccine  Aged Out   Immunization History  Administered Date(s) Administered   Influenza, Quadrivalent, Recombinant, Inj, Pf 09/17/2019   Influenza, Seasonal, Injecte, Preservative Fre 08/20/2014, 09/12/2015, 11/28/2023   Influenza,inj,Quad PF,6+ Mos 09/11/2016, 09/11/2016, 09/25/2017, 09/25/2017, 09/24/2018, 09/18/2019, 10/11/2020, 01/19/2022   Moderna Covid-19 Vaccine Bivalent Booster 22yrs & up 10/04/2022   Moderna Sars-Covid-2 Vaccination 02/09/2020, 03/08/2020, 11/15/2020, 07/28/2021, 12/15/2021   Pneumococcal Polysaccharide-23 12/10/2012   Tdap 12/11/2011, 06/22/2022   Zoster Recombinant(Shingrix ) 01/19/2022, 06/22/2022   We updated and reviewed the patient's past history in detail and it is documented below. Allergies: Patient is allergic to aspirin, [name], other, nsaids, oxycodone , soy allergy (obsolete), and penicillins. Past Medical History Patient  has a past medical history of Anxiety, Arthritis, Asthma, Cataract, Cervical myelopathy (HCC) (08/28/2018), GERD (gastroesophageal reflux disease), Heart murmur, Hiatal hernia, TIA (transient ischemic attack) (1982), Hyperlipidemia, Hypertension, Hypothyroidism, Obesity, Pneumonia, Polycystic ovarian syndrome, and TIA (transient ischemic attack). Past Surgical History Patient  has a past surgical history that includes Knee cartilage surgery (2005); Abdominal hysterectomy (2009); Tonsillectomy (2007); Femur fracture surgery (2008); Laparoscopic gastric banding (11/12/2011); I & D extremity (Left, 06/01/2018); Anterior cervical decompression/discectomy fusion 4 level (N/A, 08/28/2018); Laparoscopic gastric band removal with  laparoscopic gastric sleeve resection (N/A, 05/03/2020); Fracture surgery; Diagnostic laparoscopy; Breast lumpectomy (Left, 06/27/2020); Total knee arthroplasty (Left, 10/31/2022); Total knee arthroplasty (Right, 02/25/2023); and Esophagogastroduodenoscopy (N/A, 04/30/2024). Family History: Patient family history includes Anesthesia problems in her sister; Breast cancer (age of onset: 92) in her maternal aunt; Cancer in her maternal aunt; Colon polyps in her mother; Glaucoma in her mother; Heart disease in her father; Hyperthyroidism in her mother; Migraines in her sister; Myasthenia gravis in her paternal grandfather; Ovarian cancer in her maternal grandmother; Stroke in her father; Uterine cancer in her maternal grandmother. Social History:  Patient  reports that she has never smoked. She has never used smokeless tobacco. She reports that she does not currently use alcohol . She reports that she does not use drugs.  Review of Systems: Constitutional: negative for fever or malaise Ophthalmic: negative for photophobia, double vision or loss of vision Cardiovascular: negative for chest pain, dyspnea on exertion, or new LE swelling Respiratory:  negative for SOB or persistent cough Gastrointestinal: negative for abdominal pain, change in bowel habits or melena Genitourinary: negative for dysuria or gross hematuria, no abnormal uterine bleeding or disharge Musculoskeletal: negative for new gait disturbance or muscular weakness Integumentary: negative for new or persistent rashes, no breast lumps Neurological: negative for TIA or stroke symptoms Psychiatric: negative for SI or delusions Allergic/Immunologic: negative for hives  Patient Care Team    Relationship Specialty Notifications Start End  Jodie Lavern CROME, MD PCP - General Family Medicine  06/24/18   Nahser, Aleene PARAS, MD (Inactive) PCP - Cardiology Cardiology  08/15/23   Autumn Millman, MD Consulting Physician Oncology  12/23/23     Objective   Vitals: BP 127/71   Pulse 67   Temp 97.7 F (36.5 C)   Ht 5' 2 (1.575 m)   Wt 184 lb (83.5 kg)   SpO2 99%   BMI 33.65 kg/m  General:  Well developed, well nourished, no acute distress  Psych:  Alert and orientedx3,normal mood and affect HEENT:  Normocephalic, atraumatic, non-icteric sclera,  supple neck without adenopathy, mass or thyromegaly Cardiovascular:  Normal S1, S2, RRR without gallop, rub or murmur Respiratory:  Good breath sounds bilaterally, CTAB with normal respiratory effort Gastrointestinal: normal bowel sounds, soft, non-tender, no noted masses. No HSM MSK: extremities without edema, joints without erythema or swelling Neurologic:    Mental status is normal.  Gross motor and sensory exams are normal.  No tremor  Commons side effects, risks, benefits, and alternatives for medications and treatment plan prescribed today were discussed, and the patient expressed understanding of the given instructions. Patient is instructed to call or message via MyChart if he/she has any questions or concerns regarding our treatment plan. No barriers to understanding were identified. We discussed Red Flag symptoms and signs in detail. Patient expressed understanding regarding what to do in case of urgent or emergency type symptoms.  Medication list was reconciled, printed and provided to the patient in AVS. Patient instructions and summary information was reviewed with the patient as documented in the AVS. This note was prepared with assistance of Dragon voice recognition software. Occasional wrong-word or sound-a-like substitutions may have occurred due to the inherent limitations of voice recognition software

## 2024-07-10 ENCOUNTER — Encounter (HOSPITAL_BASED_OUTPATIENT_CLINIC_OR_DEPARTMENT_OTHER): Payer: Self-pay | Admitting: Nurse Practitioner

## 2024-07-22 ENCOUNTER — Ambulatory Visit: Payer: Self-pay | Admitting: Family Medicine

## 2024-07-22 MED ORDER — LEVOTHYROXINE SODIUM 125 MCG PO TABS: 125.0000 ug | ORAL_TABLET | Freq: Every day | ORAL | 0 refills | Status: DC

## 2024-07-22 NOTE — Progress Notes (Signed)
 See mychart note

## 2024-07-31 ENCOUNTER — Encounter: Payer: Self-pay | Admitting: Family Medicine

## 2024-08-19 ENCOUNTER — Other Ambulatory Visit: Payer: Self-pay | Admitting: Internal Medicine

## 2024-08-23 ENCOUNTER — Other Ambulatory Visit: Payer: Self-pay | Admitting: Family Medicine

## 2024-08-28 ENCOUNTER — Ambulatory Visit (HOSPITAL_BASED_OUTPATIENT_CLINIC_OR_DEPARTMENT_OTHER): Admitting: Nurse Practitioner

## 2024-09-15 ENCOUNTER — Encounter (HOSPITAL_BASED_OUTPATIENT_CLINIC_OR_DEPARTMENT_OTHER): Payer: Self-pay | Admitting: Family

## 2024-09-15 ENCOUNTER — Ambulatory Visit (HOSPITAL_BASED_OUTPATIENT_CLINIC_OR_DEPARTMENT_OTHER): Admitting: Family

## 2024-09-15 VITALS — BP 122/78 | HR 74 | Ht 62.0 in | Wt 181.0 lb

## 2024-09-15 DIAGNOSIS — E7849 Other hyperlipidemia: Secondary | ICD-10-CM

## 2024-09-15 DIAGNOSIS — Z8249 Family history of ischemic heart disease and other diseases of the circulatory system: Secondary | ICD-10-CM

## 2024-09-15 DIAGNOSIS — E890 Postprocedural hypothyroidism: Secondary | ICD-10-CM

## 2024-09-15 DIAGNOSIS — I350 Nonrheumatic aortic (valve) stenosis: Secondary | ICD-10-CM | POA: Diagnosis not present

## 2024-09-15 DIAGNOSIS — I10 Essential (primary) hypertension: Secondary | ICD-10-CM | POA: Diagnosis not present

## 2024-09-15 DIAGNOSIS — I6523 Occlusion and stenosis of bilateral carotid arteries: Secondary | ICD-10-CM | POA: Diagnosis not present

## 2024-09-15 DIAGNOSIS — I700 Atherosclerosis of aorta: Secondary | ICD-10-CM

## 2024-09-15 MED ORDER — METOPROLOL TARTRATE 100 MG PO TABS: 100.0000 mg | ORAL_TABLET | Freq: Once | ORAL | 0 refills | Status: DC

## 2024-09-15 NOTE — Progress Notes (Unsigned)
  Cardiology Office Note   Date:  09/15/2024  ID:  CHAUNA [name], DOB 09-21-1961, MRN [id number] PCP: Alison Lavern CROME, MD  [facility] Providers Cardiologist:  Aleene Passe, MD (Inactive) { Click to update primary MD,subspecialty MD or APP then REFRESH:1}    History of Present Illness Alison [name] is a 63 y.o. female with hx of HTN, obesity s/p gastric banding, hypothyroidism, murmur (bicuspid AV), provoked DVT/PE,  familial hyperlipidemia, aortic atherosclerosis.  Carotid 2019 bilateral 1-39% stenosis. Echo 07/2023 with bicuspid AV. Echo 11/2023 normal LVEF 60-65%, gr1dd, mild to moderate aortic stenosis with mean gradient 14 mmHg. AV reported as tricuspid.   ?taking rosuvastatin   Denies complaints   Was not taking crestor  regularly around time of labs  Family h/o HLD, heart disease - father with CABGx4 at 72, died oc CVA - father's side   Off eliquis  x several months   Home BPs 125/70  Walks dog, half-husky/half shepherd  Mows grass Paints furniture     Labs: thyroid  panel, lipid panel, LPa Test: echo, carotid  ROS: ***  Studies Reviewed      *** Risk Assessment/Calculations           Physical Exam VS:  BP 122/78   Pulse 74   Ht 5' 2 (1.575 m)   Wt 181 lb (82.1 kg)   SpO2 99%   BMI 33.11 kg/m        Wt Readings from Last 3 Encounters:  09/15/24 181 lb (82.1 kg)  07/07/24 184 lb (83.5 kg)  05/22/24 180 lb 4.8 oz (81.8 kg)    GEN: Well nourished, well developed in no acute distress NECK: No JVD; No carotid bruits CARDIAC: ***RRR, no murmurs, rubs, gallops RESPIRATORY:  Clear to auscultation without rales, wheezing or rhonchi  ABDOMEN: Soft, non-tender, non-distended EXTREMITIES:  No edema; No deformity   ASSESSMENT AND PLAN  HTN -   Familial hyperlipidemia -   Aortic atherosclerosis -   Aortic stenosis / Bicuspid aortic valve -  Bilateral carotid stenosis -  Hx of DVT/PE - *** Hematology team has since stopped Eliquis .         Dispo: ***  Signed, Reche GORMAN Finder, NP

## 2024-09-15 NOTE — Patient Instructions (Signed)
 Medication Instructions:   Your physician recommends that you continue on your current medications as directed. Please refer to the Current Medication list given to you today.  *If you need a refill on your cardiac medications before your next appointment, please call your pharmacy*  Lab Work:  SOMETIME THIS WEEK OR NEXT WEEK HERE AT [facility] ON 3RD FLOOR [address]--LIPIDS, LIPOPROTEIN A, THYROID  PANEL, AND CMET--PLEASE COME FASTING TO THIS LAB APPOINTMENT  If you have labs (blood work) drawn today and your tests are completely normal, you will receive your results only by: MyChart Message (if you have MyChart) OR A paper copy in the mail If you have any lab test that is abnormal or we need to change your treatment, we will call you to review the results.   Testing/Procedures:  Your physician has requested that you have an echocardiogram. Echocardiography is a painless test that uses sound waves to create images of your heart. It provides your doctor with information about the size and shape of your heart and how well your heart's chambers and valves are working. This procedure takes approximately one hour. There are no restrictions for this procedure.  PLEASE SCHEDULE ECHO TO BE DONE IN DECEMBER 2025  Please do NOT wear cologne, perfume, aftershave, or lotions (deodorant is allowed). Please arrive 15 minutes prior to your appointment time.  Please note: We ask at that you not bring children with you during ultrasound (echo/ vascular) testing. Due to room size and safety concerns, children are not allowed in the ultrasound rooms during exams. Our front office staff cannot provide observation of children in our lobby area while testing is being conducted. An adult accompanying a patient to their appointment will only be allowed in the ultrasound room at the discretion of the ultrasound technician under special circumstances. We apologize for any inconvenience.    Your physician has requested  that you have a carotid duplex. This test is an ultrasound of the carotid arteries in your neck. It looks at blood flow through these arteries that supply the brain with blood. Allow one hour for this exam. There are no restrictions or special instructions.  SCHEDULE CAROTIDS TO BE DONE IN DECEMBER 2025    CARDIAC CT---SEE INSTRUCTIONS BELOW TO REFER TO, WHEN PREPARING  FOR THIS TEST--YOU WILL GET A CALL BACK FROM OUR CT SCHEDULER ONCE PRE-CERT COMPLETE THROUGH YOUR INSURANCE COMPANY   Follow-Up: At [facility], you and your health needs are our priority.  As part of our continuing mission to provide you with exceptional heart care, our providers are all part of one team.  This team includes your primary Cardiologist (physician) and Advanced Practice Providers or APPs (Physician Assistants and Nurse Practitioners) who all work together to provide you with the care you need, when you need it.  Your next appointment:   1 year(s)  Provider:   Reche Finder, NP      Other Instructions    Your cardiac CT will be scheduled at one of the below locations:    [facility] [address]  [place], [place] [address] [phone]    If scheduled at the Heart and Vascular Tower at [facility] street, please enter the parking lot using the [address] entrance and use the FREE valet service at the patient drop-off area. Enter the building and check-in with registration on the main floor.   Please follow these instructions carefully (unless otherwise directed):  An IV will be required for  this test and Nitroglycerin will be given.  Hold all erectile dysfunction medications at least 3 days (72 hrs) prior to test. (Ie viagra, cialis, sildenafil, tadalafil, etc)   On the Night Before the Test: Be sure to Drink plenty of water . Do not consume any caffeinated/decaffeinated beverages or chocolate 12 hours prior to your test. Do not take any  antihistamines 12 hours prior to your test.    On the Day of the Test: Drink plenty of water  until 1 hour prior to the test. Do not eat any food 1 hour prior to test. You may take your regular medications prior to the test.  Take metoprolol 100 MG (Lopressor) BY MOUTH two hours prior to test. If you take Hydrochlorothiazide   please HOLD on the morning of the test. Patients who wear a continuous glucose monitor MUST remove the device prior to scanning. FEMALES- please wear underwire-free bra if available, avoid dresses & tight clothing       After the Test: Drink plenty of water . After receiving IV contrast, you may experience a mild flushed feeling. This is normal. On occasion, you may experience a mild rash up to 24 hours after the test. This is not dangerous. If this occurs, you can take Benadryl  25 mg, Zyrtec, Claritin , or Allegra and increase your fluid intake. (Patients taking Tikosyn should avoid Benadryl , and may take Zyrtec, Claritin , or Allegra) If you experience trouble breathing, this can be serious. If it is severe call 911 IMMEDIATELY. If it is mild, please call our office.  We will call to schedule your test 2-4 weeks out understanding that some insurance companies will need an authorization prior to the service being performed.   For more information and frequently asked questions, please visit our website : [email or web address]  For non-scheduling related questions, please contact the cardiac imaging nurse navigator should you have any questions/concerns: Cardiac Imaging Nurse Navigators Direct Office Dial: [phone]   For scheduling needs, including cancellations and rescheduling, please call [place], [phone].

## 2024-09-23 ENCOUNTER — Encounter (HOSPITAL_COMMUNITY): Payer: Self-pay

## 2024-09-24 ENCOUNTER — Telehealth (HOSPITAL_BASED_OUTPATIENT_CLINIC_OR_DEPARTMENT_OTHER): Payer: Self-pay | Admitting: *Deleted

## 2024-09-24 DIAGNOSIS — Z8249 Family history of ischemic heart disease and other diseases of the circulatory system: Secondary | ICD-10-CM

## 2024-09-24 DIAGNOSIS — I700 Atherosclerosis of aorta: Secondary | ICD-10-CM

## 2024-09-24 DIAGNOSIS — I350 Nonrheumatic aortic (valve) stenosis: Secondary | ICD-10-CM

## 2024-09-24 NOTE — Addendum Note (Signed)
 Addended by: GLADIS PORTER HERO on: 09/24/2024 05:19 PM   Modules accepted: Orders

## 2024-09-24 NOTE — Telephone Encounter (Signed)
 Was able to make contact with the pt.   Pt aware of that her insurance denied coverage for her CTA that was scheduled for tomorrow.  Pt is aware this appt was cancelled.   Pt is interested in getting the Calcium  Score done.  Will place the order in the system and have our scheduling team reach out to her to arrange this appt.   Pt verbalized understanding and agrees with this plan.

## 2024-09-24 NOTE — Telephone Encounter (Signed)
 Tried calling the pt again to endorse cancelled CTA for tomorrow due to non coverage from her insurance carrier.   Pt did not answer, and left her a message to call the office back.

## 2024-09-24 NOTE — Telephone Encounter (Signed)
 Secure received from our pre-cert team about this pts scheduled Cardiac CT for tomorrow.   Secure chat below is copied indicating this information to Reche Finder, NP and myself.  Good morning, patients insurance denied the scheduled CTA for tomorrow after reviewing all clinicals. They are offering a peer to peer if you would like to complete one or we can submit an appeal instead.    Porter - can we offer her a calcium  score ($99 out of pocket) instead please? T!Y    Did try reaching out to the pt to further discuss and endorse to her that we will order for her to get a $99 Calcium  Score instead, but pt did not answer.  Did leave her a detailed message to call the office back.

## 2024-09-25 ENCOUNTER — Ambulatory Visit (HOSPITAL_COMMUNITY)

## 2024-09-27 LAB — THYROID PANEL WITH TSH
Free Thyroxine Index: 4.1 (ref 1.2–4.9)
T3 Uptake Ratio: 32 % (ref 24–39)
T4, Total: 12.7 ug/dL — ABNORMAL HIGH (ref 4.5–12.0)
TSH: 0.011 u[IU]/mL — ABNORMAL LOW (ref 0.450–4.500)

## 2024-09-27 LAB — COMPREHENSIVE METABOLIC PANEL WITH GFR
ALT: 18 IU/L (ref 0–32)
AST: 20 IU/L (ref 0–40)
Albumin: 4 g/dL (ref 3.9–4.9)
Alkaline Phosphatase: 96 IU/L (ref 49–135)
BUN/Creatinine Ratio: 15 (ref 12–28)
BUN: 11 mg/dL (ref 8–27)
Bilirubin Total: 0.4 mg/dL (ref 0.0–1.2)
CO2: 25 mmol/L (ref 20–29)
Calcium: 9.6 mg/dL (ref 8.7–10.3)
Chloride: 105 mmol/L (ref 96–106)
Creatinine, Ser: 0.75 mg/dL (ref 0.57–1.00)
Globulin, Total: 2.2 g/dL (ref 1.5–4.5)
Glucose: 75 mg/dL (ref 70–99)
Potassium: 3.9 mmol/L (ref 3.5–5.2)
Sodium: 141 mmol/L (ref 134–144)
Total Protein: 6.2 g/dL (ref 6.0–8.5)
eGFR: 90 mL/min/1.73 (ref >59)

## 2024-09-27 LAB — LIPID PANEL
Chol/HDL Ratio: 2.7 ratio (ref 0.0–4.4)
Cholesterol, Total: 163 mg/dL (ref 100–199)
HDL: 61 mg/dL (ref >39)
LDL Chol Calc (NIH): 89 mg/dL (ref 0–99)
Triglycerides: 69 mg/dL (ref 0–149)
VLDL Cholesterol Cal: 13 mg/dL (ref 5–40)

## 2024-09-27 LAB — LIPOPROTEIN A (LPA): Lipoprotein (a): 329.5 nmol/L — ABNORMAL HIGH (ref <75.0)

## 2024-09-28 ENCOUNTER — Encounter: Payer: Self-pay | Admitting: Family Medicine

## 2024-09-28 ENCOUNTER — Other Ambulatory Visit (HOSPITAL_COMMUNITY): Payer: Self-pay

## 2024-09-28 ENCOUNTER — Telehealth: Payer: Self-pay | Admitting: Pharmacy Technician

## 2024-09-28 ENCOUNTER — Encounter (HOSPITAL_BASED_OUTPATIENT_CLINIC_OR_DEPARTMENT_OTHER): Payer: Self-pay

## 2024-09-28 ENCOUNTER — Ambulatory Visit (HOSPITAL_BASED_OUTPATIENT_CLINIC_OR_DEPARTMENT_OTHER): Payer: Self-pay | Admitting: Family

## 2024-09-28 DIAGNOSIS — I700 Atherosclerosis of aorta: Secondary | ICD-10-CM

## 2024-09-28 DIAGNOSIS — Z79899 Other long term (current) drug therapy: Secondary | ICD-10-CM

## 2024-09-28 DIAGNOSIS — E782 Mixed hyperlipidemia: Secondary | ICD-10-CM

## 2024-09-28 DIAGNOSIS — Z8249 Family history of ischemic heart disease and other diseases of the circulatory system: Secondary | ICD-10-CM

## 2024-09-28 DIAGNOSIS — E7849 Other hyperlipidemia: Secondary | ICD-10-CM

## 2024-09-28 MED ORDER — REPATHA SURECLICK 140 MG/ML ~~LOC~~ SOAJ: 140.0000 mg | SUBCUTANEOUS | 6 refills | Status: AC

## 2024-09-28 NOTE — Telephone Encounter (Signed)
 Pharmacy Patient Advocate Encounter   Received notification from Physician's Office that prior authorization for REpatha is required/requested.   Insurance verification completed.   The patient is insured through [facility].  Per test claim: no PA is needed:

## 2024-09-28 NOTE — Telephone Encounter (Signed)
 09/25/2024 lvm to cb and sched / AS

## 2024-09-29 NOTE — Telephone Encounter (Signed)
 RE: CALCIUM  SCORE PER CAITLIN WALKER, NP Received: Today Dyana Alan Gladis Porter CHRISTELLA, LPN Patient scheduled 10/23/2024

## 2024-10-05 ENCOUNTER — Other Ambulatory Visit: Payer: Self-pay | Admitting: Medical Genetics

## 2024-10-05 DIAGNOSIS — Z006 Encounter for examination for normal comparison and control in clinical research program: Secondary | ICD-10-CM

## 2024-10-08 ENCOUNTER — Inpatient Hospital Stay

## 2024-10-08 ENCOUNTER — Inpatient Hospital Stay: Admitting: Oncology

## 2024-10-09 ENCOUNTER — Ambulatory Visit: Admitting: Oncology

## 2024-10-09 ENCOUNTER — Ambulatory Visit: Admitting: Family Medicine

## 2024-10-09 ENCOUNTER — Other Ambulatory Visit

## 2024-10-09 ENCOUNTER — Encounter: Payer: Self-pay | Admitting: Family Medicine

## 2024-10-09 VITALS — BP 120/76 | HR 69 | Temp 97.7°F | Ht 62.0 in | Wt 181.0 lb

## 2024-10-09 DIAGNOSIS — E890 Postprocedural hypothyroidism: Secondary | ICD-10-CM

## 2024-10-09 DIAGNOSIS — Z23 Encounter for immunization: Secondary | ICD-10-CM | POA: Diagnosis not present

## 2024-10-09 DIAGNOSIS — E7849 Other hyperlipidemia: Secondary | ICD-10-CM | POA: Diagnosis not present

## 2024-10-09 DIAGNOSIS — I10 Essential (primary) hypertension: Secondary | ICD-10-CM | POA: Diagnosis not present

## 2024-10-09 MED ORDER — LEVOTHYROXINE SODIUM 112 MCG PO TABS: 112.0000 ug | ORAL_TABLET | ORAL | 3 refills | Status: AC

## 2024-10-09 MED ORDER — LEVOTHYROXINE SODIUM 125 MCG PO TABS: 125.0000 ug | ORAL_TABLET | ORAL | Status: DC

## 2024-10-09 NOTE — Progress Notes (Addendum)
 Subjective  CC:  Chief Complaint  Patient presents with   Hypothyroidism    HPI: Alison [name] is a 63 y.o. female who presents to the office today to address the problems listed above in the chief complaint. Discussed the use of AI scribe software for clinical note transcription with the patient, who gave verbal consent to proceed.  History of Present Illness Alison [name] is a 63 year old female with thyroid  dysfunction and hyperlipidemia who presents for medication management.  Thyroid  dysfunction - Currently taking levothyroxine  125 mcg daily for the past 8 to 12 weeks - Previously on levothyroxine  100 mcg, which was ineffective - Experiences intermittent pruritus with the 125 mcg dose - No sleep disturbances, fatigue, tremors, or palpitations - Overall well-being is reported as good  Hyperlipidemia and lipoprotein abnormalities - Currently taking rosuvastatin  40 mg daily - Total cholesterol has decreased with therapy - LDL cholesterol remains above target per clinician recommendation, though she does not believe it is elevated - Initiated on Repatha due to elevated lipoprotein A and family history of cardiovascular disease - Lipoprotein A level is 349  Cardiac testing - Insurance denied coverage for a specific echocardiogram test - Paid out-of-pocket for an alternative cardiac test   Assessment  1. Need for influenza vaccination      Plan  Assessment and Plan Assessment & Plan Hypothyroidism Current levothyroxine  dose of 125 mcg is too high, causing random itching. Previous dose of 112 mcg was too low, leading to symptoms. Current symptoms include random itching, but no sleep disturbances, energy issues, shakiness, or palpitations. Requires variable dosing due to fluctuating needs. - Adjust levothyroxine  to 112 mcg on Monday, Wednesday, and Friday. - Maintain levothyroxine  at 125 mcg on Tuesday, Thursday, and Sunday. - Will re-evaluate thyroid  function in 6-8 weeks  with lab tests.  Hyperlipidemia Currently on rosuvastatin  40 mg, which has reduced total cholesterol. Lipoprotein A level is elevated at 349. Repatha was added due to concerns about plaque and family history. Insurance issues with echocardiogram, but alternative testing planned. - Continue rosuvastatin  40 mg (will clarify with cards). - Continue Repatha as prescribed. - Proceed with alternative testing for plaque assessment.    Follow up: prn Orders Placed This Encounter  Procedures   Flu vaccine trivalent PF, 6mos and older(Flulaval,Afluria,Fluarix,Fluzone)   No orders of the defined types were placed in this encounter.    I reviewed the patients updated PMH, FH, and SocHx.  Patient Active Problem List   Diagnosis Date Noted   Acute deep vein thrombosis (DVT) of right popliteal vein (HCC) 11/28/2023    Priority: High   Acute pulmonary embolism without acute cor pulmonale (HCC) 11/19/2023    Priority: High   Hiatal hernia 06/24/2023    Priority: High   Class 2 severe obesity with body mass index (BMI) of 35 to 39.9 with serious comorbidity 03/02/2020    Priority: High   Postablative hypothyroidism 02/03/2020    Priority: High   Systolic murmur 09/29/2019    Priority: High   GAD (generalized anxiety disorder) 06/24/2018    Priority: High   Mixed hyperlipidemia 06/24/2018    Priority: High   Essential hypertension 05/31/2018    Priority: High   Family history of premature CAD 12/29/2013    Priority: High   History of TIA (transient ischemic attack) 12/10/1980    Priority: High   Gastroesophageal reflux disease 10/11/2020    Priority: Medium    S/P gastric sleeve procedure 10/11/2020    Priority:  Medium    Mild intermittent asthma without complication 06/24/2018    Priority: Medium    Primary osteoarthritis of both knees 09/11/2016    Priority: Medium    Gastric banding status 10/07/2015    Priority: Medium    Restless legs syndrome (RLS) 02/14/2015    Priority:  Medium    Insomnia 12/29/2013    Priority: Medium    Status post right knee replacement 06/24/2023    Priority: Low   Status post left knee replacement 01/04/2023    Priority: Low   Gastric polyp 03/05/2024   Mass of right breast 12/20/2023   Aortic valve calcification 12/20/2023   Healthcare maintenance 12/20/2023   Hypoalbuminemia due to protein-calorie malnutrition 11/28/2023   Subcutaneous nodule of breast 11/19/2023   Current Meds  Medication Sig   albuterol  (VENTOLIN  HFA) 108 (90 Base) MCG/ACT inhaler Inhale 2 puffs into the lungs every 6 (six) hours as needed. For asthma   ALPRAZolam  (XANAX ) 0.25 MG tablet TAKE 1 TABLET BY MOUTH AT BEDTIME AS NEEDED FOR ANXIETY OR SLEEP.   Blood Pressure Monitoring Soln KIT    escitalopram  (LEXAPRO ) 10 MG tablet TAKE 1 TABLET BY MOUTH EVERY DAY   Evolocumab (REPATHA SURECLICK) 140 MG/ML SOAJ Inject 140 mg into the skin every 14 (fourteen) days.   famotidine  (PEPCID ) 20 MG tablet Take 1 tablet (20 mg total) by mouth at bedtime.   hydrochlorothiazide  (MICROZIDE ) 12.5 MG capsule TAKE 1 CAPSULE BY MOUTH EVERY DAY   levothyroxine  (SYNTHROID ) 125 MCG tablet Take 1 tablet (125 mcg total) by mouth daily.   Multiple Vitamin (MULTIVITAMIN) capsule Take 1 capsule by mouth daily.   pantoprazole  (PROTONIX ) 40 MG tablet TAKE 1 TABLET BY MOUTH TWICE A DAY   rOPINIRole  (REQUIP ) 3 MG tablet TAKE 1 TABLET BY MOUTH AT BEDTIME.   rosuvastatin  (CRESTOR ) 40 MG tablet TAKE 1 TABLET BY MOUTH EVERYDAY AT BEDTIME   sucralfate  (CARAFATE ) 1 GM/10ML suspension TAKE 10 MLS (1 G TOTAL) BY MOUTH 4 (FOUR) TIMES DAILY FOR 28 DAYS.   traZODone  (DESYREL ) 50 MG tablet Take 0.5-1 tablets (25-50 mg total) by mouth at bedtime as needed for sleep.   Allergies: Patient is allergic to aspirin, cherry, other, nsaids, oxycodone , soy allergy (obsolete), and penicillins. Family History: Patient family history includes Anesthesia problems in her sister; Breast cancer (age of onset: 64) in  her maternal aunt; Cancer in her maternal aunt; Colon polyps in her mother; Glaucoma in her mother; Heart disease in her father; Hyperthyroidism in her mother; Migraines in her sister; Myasthenia gravis in her paternal grandfather; Ovarian cancer in her maternal grandmother; Stroke in her father; Uterine cancer in her maternal grandmother. Social History:  Patient  reports that she has never smoked. She has never used smokeless tobacco. She reports that she does not currently use alcohol . She reports that she does not use drugs.  Review of Systems: Constitutional: Negative for fever malaise or anorexia Cardiovascular: negative for chest pain Respiratory: negative for SOB or persistent cough Gastrointestinal: negative for abdominal pain  Objective  Vitals: BP 120/76   Pulse 69   Temp 97.7 F (36.5 C)   Ht 5' 2 (1.575 m)   Wt 181 lb (82.1 kg)   SpO2 99%   BMI 33.11 kg/m  General: no acute distress , A&Ox3 HEENT: PEERL, conjunctiva normal, neck is supple Cardiovascular:  RRR 3/6 systolic murmur Respiratory:  Good breath sounds bilaterally, CTAB with normal respiratory effort Skin:  Warm, no rashes No tremor, no lid lag Commons side  effects, risks, benefits, and alternatives for medications and treatment plan prescribed today were discussed, and the patient expressed understanding of the given instructions. Patient is instructed to call or message via MyChart if he/she has any questions or concerns regarding our treatment plan. No barriers to understanding were identified. We discussed Red Flag symptoms and signs in detail. Patient expressed understanding regarding what to do in case of urgent or emergency type symptoms.  Medication list was reconciled, printed and provided to the patient in AVS. Patient instructions and summary information was reviewed with the patient as documented in the AVS. This note was prepared with assistance of Dragon voice recognition software. Occasional  wrong-word or sound-a-like substitutions may have occurred due to the inherent limitations of voice recognition software

## 2024-10-09 NOTE — Patient Instructions (Signed)
 Please return in 6-8 weeks for lab visit for TSH recheck.   If you have any questions or concerns, please don't hesitate to send me a message via MyChart or call the office at [phone]. Thank you for visiting with us  today! It's our pleasure caring for you.    VISIT SUMMARY: You came in today for medication management related to your thyroid  dysfunction and hyperlipidemia. We discussed your current medications and made some adjustments to better manage your conditions.  YOUR PLAN: -HYPOTHYROIDISM: Hypothyroidism is a condition where your thyroid  gland does not produce enough thyroid  hormone. We are adjusting your levothyroxine  dose to 112 mcg on Monday, Wednesday, and Friday, and 125 mcg on Tuesday, Thursday, and Sunday to help manage your symptoms. We will re-evaluate your thyroid  function in 6-8 weeks with lab tests.  -HYPERLIPIDEMIA: Hyperlipidemia is a condition where there are high levels of fats (lipids) in your blood. You will continue taking rosuvastatin  40 mg daily and Repatha as prescribed to manage your cholesterol levels. We will also proceed with alternative testing to assess for plaque buildup in your arteries.  INSTRUCTIONS: We will re-evaluate your thyroid  function in 6-8 weeks with lab tests. Continue taking your medications as prescribed and proceed with the alternative cardiac testing as planned.                      Contains text generated by Abridge.                                 Contains text generated by Abridge.

## 2024-10-22 ENCOUNTER — Other Ambulatory Visit: Payer: Self-pay | Admitting: Oncology

## 2024-10-22 DIAGNOSIS — I82431 Acute embolism and thrombosis of right popliteal vein: Secondary | ICD-10-CM

## 2024-10-23 ENCOUNTER — Encounter: Payer: Self-pay | Admitting: Oncology

## 2024-10-23 ENCOUNTER — Inpatient Hospital Stay: Admitting: Oncology

## 2024-10-23 ENCOUNTER — Ambulatory Visit (HOSPITAL_BASED_OUTPATIENT_CLINIC_OR_DEPARTMENT_OTHER)
Admission: RE | Admit: 2024-10-23 | Discharge: 2024-10-23 | Disposition: A | Discharge status: Home / Self Care | Payer: Self-pay | Source: Ambulatory Visit | Attending: Family | Admitting: Family

## 2024-10-23 ENCOUNTER — Inpatient Hospital Stay: Attending: Oncology

## 2024-10-23 VITALS — BP 126/82 | HR 71 | Temp 98.0°F | Resp 16 | Ht 62.0 in | Wt 179.0 lb

## 2024-10-23 DIAGNOSIS — N6310 Unspecified lump in the right breast, unspecified quadrant: Secondary | ICD-10-CM | POA: Diagnosis not present

## 2024-10-23 DIAGNOSIS — Z79899 Other long term (current) drug therapy: Secondary | ICD-10-CM | POA: Diagnosis not present

## 2024-10-23 DIAGNOSIS — E860 Dehydration: Secondary | ICD-10-CM | POA: Diagnosis not present

## 2024-10-23 DIAGNOSIS — Z86718 Personal history of other venous thrombosis and embolism: Secondary | ICD-10-CM

## 2024-10-23 DIAGNOSIS — Z86711 Personal history of pulmonary embolism: Secondary | ICD-10-CM | POA: Insufficient documentation

## 2024-10-23 DIAGNOSIS — E039 Hypothyroidism, unspecified: Secondary | ICD-10-CM | POA: Insufficient documentation

## 2024-10-23 DIAGNOSIS — E785 Hyperlipidemia, unspecified: Secondary | ICD-10-CM | POA: Insufficient documentation

## 2024-10-23 DIAGNOSIS — Z8249 Family history of ischemic heart disease and other diseases of the circulatory system: Secondary | ICD-10-CM | POA: Insufficient documentation

## 2024-10-23 DIAGNOSIS — Z96659 Presence of unspecified artificial knee joint: Secondary | ICD-10-CM | POA: Diagnosis not present

## 2024-10-23 DIAGNOSIS — Z91018 Allergy to other foods: Secondary | ICD-10-CM | POA: Diagnosis not present

## 2024-10-23 DIAGNOSIS — Z79890 Hormone replacement therapy (postmenopausal): Secondary | ICD-10-CM | POA: Insufficient documentation

## 2024-10-23 DIAGNOSIS — I82431 Acute embolism and thrombosis of right popliteal vein: Secondary | ICD-10-CM | POA: Diagnosis not present

## 2024-10-23 DIAGNOSIS — Z885 Allergy status to narcotic agent status: Secondary | ICD-10-CM | POA: Diagnosis not present

## 2024-10-23 DIAGNOSIS — Z7901 Long term (current) use of anticoagulants: Secondary | ICD-10-CM | POA: Diagnosis not present

## 2024-10-23 DIAGNOSIS — N6311 Unspecified lump in the right breast, upper outer quadrant: Secondary | ICD-10-CM | POA: Insufficient documentation

## 2024-10-23 DIAGNOSIS — Z880 Allergy status to penicillin: Secondary | ICD-10-CM | POA: Diagnosis not present

## 2024-10-23 DIAGNOSIS — I350 Nonrheumatic aortic (valve) stenosis: Secondary | ICD-10-CM | POA: Insufficient documentation

## 2024-10-23 DIAGNOSIS — Z886 Allergy status to analgesic agent status: Secondary | ICD-10-CM | POA: Insufficient documentation

## 2024-10-23 DIAGNOSIS — I10 Essential (primary) hypertension: Secondary | ICD-10-CM | POA: Diagnosis not present

## 2024-10-23 DIAGNOSIS — I700 Atherosclerosis of aorta: Secondary | ICD-10-CM | POA: Insufficient documentation

## 2024-10-23 LAB — CBC WITH DIFFERENTIAL (CANCER CENTER ONLY)
Abs Immature Granulocytes: 0.01 K/uL (ref 0.00–0.07)
Basophils Absolute: 0 K/uL (ref 0.0–0.1)
Basophils Relative: 1 %
Eosinophils Absolute: 0.2 K/uL (ref 0.0–0.5)
Eosinophils Relative: 3 %
HCT: 41.5 % (ref 36.0–46.0)
Hemoglobin: 14.6 g/dL (ref 12.0–15.0)
Immature Granulocytes: 0 %
Lymphocytes Relative: 32 %
Lymphs Abs: 1.9 K/uL (ref 0.7–4.0)
MCH: 30.9 pg (ref 26.0–34.0)
MCHC: 35.2 g/dL (ref 30.0–36.0)
MCV: 87.9 fL (ref 80.0–100.0)
Monocytes Absolute: 0.4 K/uL (ref 0.1–1.0)
Monocytes Relative: 6 %
Neutro Abs: 3.6 K/uL (ref 1.7–7.7)
Neutrophils Relative %: 58 %
Platelet Count: 194 K/uL (ref 150–400)
RBC: 4.72 MIL/uL (ref 3.87–5.11)
RDW: 12.9 % (ref 11.5–15.5)
WBC Count: 6.1 K/uL (ref 4.0–10.5)
nRBC: 0 % (ref 0.0–0.2)

## 2024-10-23 LAB — D-DIMER, QUANTITATIVE: D-Dimer, Quant: 0.95 ug{FEU}/mL — ABNORMAL HIGH (ref 0.00–0.50)

## 2024-10-23 NOTE — Progress Notes (Signed)
 [facility]  HEMATOLOGY CLINIC PROGRESS NOTE  PATIENT NAME: Alison [name]   MR#: [id number] DOB: 1961/01/24  Patient Care Team: Jodie Lavern CROME, MD as PCP - General (Family Medicine) Nahser, Aleene PARAS, MD (Inactive) as PCP - Cardiology (Cardiology) Autumn Millman, MD as Consulting Physician (Oncology) Vannie Reche RAMAN, NP as Nurse Practitioner (Cardiology)  Date of visit: 10/23/2024   ASSESSMENT & PLAN:   ANAB Alison [name] is a 63 y.o. lady with a past medical history of hypertension, hypothyroidism, dyslipidemia, was referred to our service for evaluation of possible hypercoagulable state, given her history of multiple pulmonary emboli diagnosed in December 2024.   Grossly negative hypercoagulable workup.  History of pulmonary embolism She was previously on Eliquis  5 mg twice daily. No symptoms of pain or respiratory distress. Discussed importance of continuing anticoagulation therapy for at least six months to prevent recurrence and severe complications.   -Long road trips and dehydration may have contributed to clot formation.  She also received COVID-19 vaccine approximately 6 months prior to DVT diagnosis.  Patient attributes the thrombosis to that.    Emphasized adherence to medication, hydration, and taking breaks during long trips.   Given the degree of thromboembolism, it would be prudent to rule out underlying hypercoagulable state.  Hence we pursued hypercoagulable workup on 12/20/2023 during her initial consultation with us .  It was grossly unremarkable without evidence of factor V Leiden mutation, prothrombin gene mutation, anticardiolipin antibodies, beta-2  glycoprotein antibodies or lupus anticoagulant.    Protein C activity, protein S activity, Antithrombin III  activity were deferred previously, since they can be falsely abnormal given recent thromboembolism.  We did check them on 05/22/2024 and were all within normal limits.  Following completion of 6 months of  anticoagulation, patient decided to come off of Eliquis  in June 2025, since thrombophilia workup was negative.    D-dimer was 5.86 in December 2024.  It was previously normal at 0.45, slightly increased at 0.95 today.   Clinically he has been doing fairly well without any signs or symptoms suggestive of recurrence of thromboembolism.  Will continue to monitor her closely.  I will see her again in approximately 2 months with repeat labs including D-dimer.  If persistently elevated D-dimer is noted, we will pursue imaging studies to look for any thromboembolism.  - Monitor for symptoms of clot recurrence, including pain, redness, swelling, trouble breathing, chest pain, back pain, or stroke-like symptoms.  History of deep venous thrombosis (DVT) of distal vein of right lower extremity DVT in the right lower leg confirmed via imaging. No symptoms such as swelling, redness, or pain. Prolonged immobility during a long drive (6 hours) may have contributed to clot formation.   Discussed importance of hydration and taking breaks during long trips to prevent recurrence.  Recommendations regarding anticoagulation as above.   - Monitor for any new symptoms of DVT  Mass of right breast Right breast lesion identified on CT scan in December 2024. History of benign left breast lumpectomy in 2021. Delays in obtaining diagnostic imaging due to medical records transfer issues. No palpable mass detected.   She eventually had diagnostic bilateral mammogram on 12/23/2023.  It showed benign, stable mass in the upper outer right breast, corresponding to abnormality noted on her recent chest CT.  This corresponds to a group of cysts noted on diagnostic imaging and ultrasound on 04/15/2020.  BI-RADS 2, benign finding.   I spent a total of 32 minutes during this encounter with the patient including  review of chart and various tests results, discussions about plan of care and coordination of care plan.  I reviewed lab  results and outside records for this visit and discussed relevant results with the patient. Diagnosis, plan of care and treatment options were also discussed in detail with the patient. Opportunity provided to ask questions and answers provided to her apparent satisfaction. Provided instructions to call our clinic with any problems, questions or concerns prior to return visit. I recommended to continue follow-up with PCP and sub-specialists. She verbalized understanding and agreed with the plan. No barriers to learning was detected.  [name], MD  10/23/2024 8:55 AM  [facility] - A DEPT OF [facility] [address] [place] [address] Dept: [phone] Dept Fax: [phone]   CHIEF COMPLAINT/ REASON FOR VISIT:  Follow-up for history of PE and DVT in December 2024, likely provoked from long road travel versus COVID-vaccine.  Grossly negative hypercoagulable workup.  INTERVAL HISTORY:  Discussed the use of AI scribe software for clinical note transcription with the patient, who gave verbal consent to proceed.  History of Present Illness [name] is a 63 year old female who presents for follow-up after stopping Eliquis .  She stopped taking Eliquis  as discussed in her last visit in June. She has not experienced any symptoms of deep vein thrombosis (DVT) such as swelling, warmth, redness, or pain in her legs. She mentions that she had no symptoms of DVT when the clot was initially discovered.  Her recent blood work included blood counts and D-dimer levels. The D-dimer level was 0.45, significantly lower than the initial level of 5.8 when the clot was first found.  No chest pain, trouble breathing, or leg swelling. She remains physically active, using a bike at her desk, and stays hydrated.  She is allergic to aspirin and does not take it. She recalls the timing of her COVID vaccine was over a year ago and wonders  if it could have been related to her clotting event, although she acknowledges the uncertainty of this connection.    SUMMARY OF HEMATOLOGIC HISTORY:  She presented to our clinic after a diagnosis of pulmonary emboli and deep vein thrombosis (DVT) in her right lower leg, diagnosed in December 2024. The patient experienced severe pain in her side and difficulty breathing while on a trip to [place], [place], which led to her hospitalization and diagnosis. She reports no current symptoms related to the DVT, including swelling, redness, or pain in the leg. She is currently on Eliquis  for anticoagulation and has been compliant with her medication.   In addition to her DVT and pulmonary emboli, the patient also mentions a right breast lesion detected on a CT scan during her hospitalization. She has been trying to get a diagnostic mammogram scheduled but has faced delays due to issues with medical records transfer. She has a history of a benign lumpectomy in her left breast two years ago.   The patient also has a history of knee replacements, with the most recent one done in March 2024. She reports no current issues with her knees. She also has a hiatal hernia, for which she takes Pepcid  and Tums, and is due for a follow-up endoscopy in February.   Of note, she drove from [place] all the way to [facility] [place] just prior to the diagnosis of DVT and pulmonary emboli, which is about 5 and half hours of drive,  without taking any breaks.   Long road trip and dehydration may have contributed to clot formation. She did have COVID-19 vaccine approximately 6 months prior to the DVT diagnosis.  Emphasized adherence to medication, hydration, and taking breaks during long trips. Continue wearing compression socks.   Given the degree of thromboembolism, it would be prudent to rule out underlying hypercoagulable state.  Hence we pursued hypercoagulable workup on 12/20/2023 during her initial consultation with us .  It  was grossly unremarkable without evidence of factor V Leiden mutation, prothrombin gene mutation, anticardiolipin antibodies, beta-2  glycoprotein antibodies or lupus anticoagulant.     Protein C activity, protein S activity, Antithrombin III  activity were deferred, since they can be falsely abnormal given recent thromboembolism.   Continue Eliquis  5 mg twice daily for six months   D-dimer was normal at 0.41 on 12/20/23 and on 0.45 on 05/22/24, indicating adequate anticoagulation.  We did check protein C activity, protein S activity, Antithrombin III  activity on 05/22/2024.  They were all within normal limits.  After completion of 6 months of anticoagulation, patient decided to come off of anticoagulation since thrombophilia workup was negative.  Eliquis  was discontinued in June 2025.  She is allergic to Aspirin and hence not on it.     Mass of right breast Right breast lesion identified on CT scan in December 2024. History of benign left breast lumpectomy in 2021. Delays in obtaining diagnostic imaging due to medical records transfer issues. No palpable mass detected.    She eventually had diagnostic bilateral mammogram on 12/23/2023.  It showed benign, stable mass in the upper outer right breast, corresponding to abnormality noted on her recent chest CT.  This corresponds to a group of cysts noted on diagnostic imaging and ultrasound on 04/15/2020.  BI-RADS 2, benign finding.  I have reviewed the past medical history, past surgical history, social history and family history with the patient and they are unchanged from previous note.  ALLERGIES: She is allergic to aspirin, cherry, other, nsaids, oxycodone , soy allergy (obsolete), and penicillins.  MEDICATIONS:  Current Outpatient Medications  Medication Sig Dispense Refill   albuterol  (VENTOLIN  HFA) 108 (90 Base) MCG/ACT inhaler Inhale 2 puffs into the lungs every 6 (six) hours as needed. For asthma 18 g 2   ALPRAZolam  (XANAX ) 0.25 MG tablet TAKE  1 TABLET BY MOUTH AT BEDTIME AS NEEDED FOR ANXIETY OR SLEEP. 20 tablet 0   Blood Pressure Monitoring Soln KIT      escitalopram  (LEXAPRO ) 10 MG tablet TAKE 1 TABLET BY MOUTH EVERY DAY 90 tablet 3   Evolocumab  (REPATHA  SURECLICK) 140 MG/ML SOAJ Inject 140 mg into the skin every 14 (fourteen) days. 2 mL 6   famotidine  (PEPCID ) 20 MG tablet Take 1 tablet (20 mg total) by mouth at bedtime. 90 tablet 3   hydrochlorothiazide  (MICROZIDE ) 12.5 MG capsule TAKE 1 CAPSULE BY MOUTH EVERY DAY 90 capsule 3   levothyroxine  (SYNTHROID ) 112 MCG tablet Take 1 tablet (112 mcg total) by mouth every Monday, Wednesday, and Friday. 36 tablet 3   levothyroxine  (SYNTHROID ) 125 MCG tablet Take 1 tablet (125 mcg total) by mouth every Tuesday, Thursday, Saturday, and Sunday.     Multiple Vitamin (MULTIVITAMIN) capsule Take 1 capsule by mouth daily.     pantoprazole  (PROTONIX ) 40 MG tablet TAKE 1 TABLET BY MOUTH TWICE A DAY 180 tablet 1   rOPINIRole  (REQUIP ) 3 MG tablet TAKE 1 TABLET BY MOUTH AT BEDTIME. 90 tablet 2   rosuvastatin  (CRESTOR ) 40 MG tablet TAKE 1  TABLET BY MOUTH EVERYDAY AT BEDTIME 90 tablet 3   sucralfate  (CARAFATE ) 1 GM/10ML suspension TAKE 10 MLS (1 G TOTAL) BY MOUTH 4 (FOUR) TIMES DAILY FOR 28 DAYS. 3360 mL 1   traZODone  (DESYREL ) 50 MG tablet Take 0.5-1 tablets (25-50 mg total) by mouth at bedtime as needed for sleep. 90 tablet 3   No current facility-administered medications for this visit.     REVIEW OF SYSTEMS:    Review of Systems - Oncology  All other pertinent systems were reviewed with the patient and are negative.  PHYSICAL EXAMINATION:   Onc Performance Status - 10/23/24 0800       ECOG Perf Status   ECOG Perf Status Fully active, able to carry on all pre-disease performance without restriction      KPS SCALE   KPS % SCORE Normal, no compliants, no evidence of disease          Vitals:   10/23/24 0836  BP: 126/82  Pulse: 71  Resp: 16  Temp: 98 F (36.7 C)  SpO2: 99%    Filed Weights   10/23/24 0836  Weight: 179 lb (81.2 kg)    Physical Exam Constitutional:      General: She is not in acute distress.    Appearance: Normal appearance.  HENT:     Head: Normocephalic and atraumatic.  Cardiovascular:     Rate and Rhythm: Normal rate.     Heart sounds: Murmur heard.  Pulmonary:     Effort: Pulmonary effort is normal. No respiratory distress.     Breath sounds: Normal breath sounds.  Abdominal:     General: There is no distension.  Neurological:     General: No focal deficit present.     Mental Status: She is alert and oriented to person, place, and time.  Psychiatric:        Mood and Affect: Mood normal.        Behavior: Behavior normal.     LABORATORY DATA:   I have reviewed the data as listed.  Results for orders placed or performed in visit on 10/23/24  CBC with Differential (Cancer Center Only)  Result Value Ref Range   WBC Count 6.1 4.0 - 10.5 K/uL   RBC 4.72 3.87 - 5.11 MIL/uL   Hemoglobin 14.6 12.0 - 15.0 g/dL   HCT 58.4 63.9 - 53.9 %   MCV 87.9 80.0 - 100.0 fL   MCH 30.9 26.0 - 34.0 pg   MCHC 35.2 30.0 - 36.0 g/dL   RDW 87.0 88.4 - 84.4 %   Platelet Count 194 150 - 400 K/uL   nRBC 0.0 0.0 - 0.2 %   Neutrophils Relative % 58 %   Neutro Abs 3.6 1.7 - 7.7 K/uL   Lymphocytes Relative 32 %   Lymphs Abs 1.9 0.7 - 4.0 K/uL   Monocytes Relative 6 %   Monocytes Absolute 0.4 0.1 - 1.0 K/uL   Eosinophils Relative 3 %   Eosinophils Absolute 0.2 0.0 - 0.5 K/uL   Basophils Relative 1 %   Basophils Absolute 0.0 0.0 - 0.1 K/uL   Immature Granulocytes 0 %   Abs Immature Granulocytes 0.01 0.00 - 0.07 K/uL  D-dimer, quantitative  Result Value Ref Range   D-Dimer, Quant 0.95 (H) 0.00 - 0.50 ug/mL-FEU     RADIOGRAPHIC STUDIES:  No recent pertinent imaging studies available to review.  Orders Placed This Encounter  Procedures   CBC with Differential (Cancer Center Only)    Standing Status:  Future    Expiration Date:    10/23/2025   D-dimer, quantitative    Standing Status:   Future    Expiration Date:   10/23/2025     Future Appointments  Date Time Provider Department Center  11/20/2024 10:00 AM [facility] [address]  11/20/2024 11:00 AM [facility] [address]  01/15/2025  9:30 AM Jodie Lavern CROME, MD [facility]  07/16/2025  9:30 AM Jodie Lavern CROME, MD [facility]  10/26/2025  9:15 AM [facility] PHLEBOTOMIST [facility] None  10/26/2025  9:45 AM Mouhamadou Gittleman, Chinita, MD [facility] None     This document was completed utilizing speech recognition software. Grammatical errors, random word insertions, pronoun errors, and incomplete sentences are an occasional consequence of this system due to software limitations, ambient noise, and hardware issues. Any formal questions or concerns about the content, text or information contained within the body of this dictation should be directly addressed to the provider for clarification.

## 2024-10-27 ENCOUNTER — Other Ambulatory Visit: Payer: Self-pay | Admitting: Family Medicine

## 2024-10-27 ENCOUNTER — Ambulatory Visit (HOSPITAL_BASED_OUTPATIENT_CLINIC_OR_DEPARTMENT_OTHER): Payer: Self-pay | Admitting: Family

## 2024-10-27 DIAGNOSIS — I77810 Thoracic aortic ectasia: Secondary | ICD-10-CM

## 2024-10-27 NOTE — Telephone Encounter (Signed)
 Left the pt a message to call the office back to endorse results and plan per Reche Finder, NP.   Will go ahead and place the order for CT ANGIO CHEST AORTA to be done in 6 months in the system and send a message to our scheduling dept to reach out to the pt to arrange this test for that time.    Will mychart the pt and make her aware of this plan.  Will also place a future bmet to have done prior to CT Angio Aorta, per CT protocol.

## 2024-10-27 NOTE — Telephone Encounter (Signed)
-----   Message from Reche GORMAN Finder sent at 10/27/2024  9:44 AM EST ----- Calcium  score of 192 which is more than expected for age. The reassuring thing is she is not experiencing any chest pain, pressure, tightness at our last office visit. Mildly dilated ascending aorta.  Recommend CT Aorta in 6 months for monitoring.   Continue Repatha, Rosuvastatin  to prevent progression of coronary artery disease.   Recommend aiming for 150 minutes of moderate intensity activity per week and following a heart healthy diet.   ----- Message ----- From: Interface, Rad Results In Sent: 10/27/2024   8:38 AM EST To: Reche GORMAN Finder, NP

## 2024-10-28 ENCOUNTER — Other Ambulatory Visit: Payer: Self-pay | Admitting: Oncology

## 2024-10-28 DIAGNOSIS — I2699 Other pulmonary embolism without acute cor pulmonale: Secondary | ICD-10-CM

## 2024-10-29 ENCOUNTER — Encounter (HOSPITAL_BASED_OUTPATIENT_CLINIC_OR_DEPARTMENT_OTHER): Payer: Self-pay

## 2024-11-02 ENCOUNTER — Encounter: Payer: Self-pay | Admitting: Oncology

## 2024-11-02 NOTE — Assessment & Plan Note (Signed)
 Right breast lesion identified on CT scan in December 2024. History of benign left breast lumpectomy in 2021. Delays in obtaining diagnostic imaging due to medical records transfer issues. No palpable mass detected.   She eventually had diagnostic bilateral mammogram on 12/23/2023.  It showed benign, stable mass in the upper outer right breast, corresponding to abnormality noted on her recent chest CT.  This corresponds to a group of cysts noted on diagnostic imaging and ultrasound on 04/15/2020.  BI-RADS 2, benign finding.

## 2024-11-02 NOTE — Assessment & Plan Note (Signed)
 DVT in the right lower leg confirmed via imaging. No symptoms such as swelling, redness, or pain. Prolonged immobility during a long drive (6 hours) may have contributed to clot formation.   Discussed importance of hydration and taking breaks during long trips to prevent recurrence.  Recommendations regarding anticoagulation as above.   - Monitor for any new symptoms of DVT

## 2024-11-02 NOTE — Assessment & Plan Note (Signed)
 She was previously on Eliquis  5 mg twice daily. No symptoms of pain or respiratory distress. Discussed importance of continuing anticoagulation therapy for at least six months to prevent recurrence and severe complications.   -Long road trips and dehydration may have contributed to clot formation.  She also received COVID-19 vaccine approximately 6 months prior to DVT diagnosis.  Patient attributes the thrombosis to that.    Emphasized adherence to medication, hydration, and taking breaks during long trips.   Given the degree of thromboembolism, it would be prudent to rule out underlying hypercoagulable state.  Hence we pursued hypercoagulable workup on 12/20/2023 during her initial consultation with us .  It was grossly unremarkable without evidence of factor V Leiden mutation, prothrombin gene mutation, anticardiolipin antibodies, beta-2  glycoprotein antibodies or lupus anticoagulant.    Protein C activity, protein S activity, Antithrombin III  activity were deferred previously, since they can be falsely abnormal given recent thromboembolism.  We did check them on 05/22/2024 and were all within normal limits.  Following completion of 6 months of anticoagulation, patient decided to come off of Eliquis  in June 2025, since thrombophilia workup was negative.    D-dimer was 5.86 in December 2024.  It was previously normal at 0.45, slightly increased at 0.95 today.   Clinically he has been doing fairly well without any signs or symptoms suggestive of recurrence of thromboembolism.  Will continue to monitor her closely.  I will see her again in approximately 2 months with repeat labs including D-dimer.  If persistently elevated D-dimer is noted, we will pursue imaging studies to look for any thromboembolism.  - Monitor for symptoms of clot recurrence, including pain, redness, swelling, trouble breathing, chest pain, back pain, or stroke-like symptoms.

## 2024-11-03 ENCOUNTER — Telehealth: Payer: Self-pay | Admitting: Oncology

## 2024-11-03 NOTE — Telephone Encounter (Signed)
 Left detailed message on PT voicemail about upcoming appt.

## 2024-11-20 ENCOUNTER — Other Ambulatory Visit (INDEPENDENT_AMBULATORY_CARE_PROVIDER_SITE_OTHER)

## 2024-11-20 ENCOUNTER — Ambulatory Visit (INDEPENDENT_AMBULATORY_CARE_PROVIDER_SITE_OTHER)

## 2024-11-20 DIAGNOSIS — I6523 Occlusion and stenosis of bilateral carotid arteries: Secondary | ICD-10-CM

## 2024-11-20 DIAGNOSIS — I10 Essential (primary) hypertension: Secondary | ICD-10-CM | POA: Diagnosis not present

## 2024-11-20 DIAGNOSIS — E890 Postprocedural hypothyroidism: Secondary | ICD-10-CM

## 2024-11-20 DIAGNOSIS — I350 Nonrheumatic aortic (valve) stenosis: Secondary | ICD-10-CM | POA: Diagnosis not present

## 2024-11-20 DIAGNOSIS — E7849 Other hyperlipidemia: Secondary | ICD-10-CM | POA: Diagnosis not present

## 2024-11-20 LAB — ECHOCARDIOGRAM COMPLETE
AR max vel: 1.06 cm2
AV Area VTI: 0.99 cm2
AV Area mean vel: 0.99 cm2
AV Mean grad: 14 mmHg
AV Peak grad: 26.1 mmHg
Ao pk vel: 2.56 m/s
Area-P 1/2: 3.5 cm2
S' Lateral: 2.31 cm

## 2024-11-22 ENCOUNTER — Encounter (HOSPITAL_BASED_OUTPATIENT_CLINIC_OR_DEPARTMENT_OTHER): Payer: Self-pay

## 2024-11-29 LAB — GENECONNECT MOLECULAR SCREEN: Genetic Analysis Overall Interpretation: NEGATIVE

## 2024-12-13 ENCOUNTER — Other Ambulatory Visit: Payer: Self-pay | Admitting: Internal Medicine

## 2024-12-13 DIAGNOSIS — K219 Gastro-esophageal reflux disease without esophagitis: Secondary | ICD-10-CM

## 2024-12-14 ENCOUNTER — Encounter (HOSPITAL_COMMUNITY): Payer: Self-pay | Admitting: *Deleted

## 2024-12-15 ENCOUNTER — Encounter (HOSPITAL_BASED_OUTPATIENT_CLINIC_OR_DEPARTMENT_OTHER): Payer: Self-pay

## 2024-12-15 DIAGNOSIS — Z8249 Family history of ischemic heart disease and other diseases of the circulatory system: Secondary | ICD-10-CM

## 2024-12-15 DIAGNOSIS — E782 Mixed hyperlipidemia: Secondary | ICD-10-CM

## 2024-12-15 DIAGNOSIS — I700 Atherosclerosis of aorta: Secondary | ICD-10-CM

## 2024-12-15 DIAGNOSIS — E7849 Other hyperlipidemia: Secondary | ICD-10-CM

## 2024-12-15 DIAGNOSIS — Z79899 Other long term (current) drug therapy: Secondary | ICD-10-CM

## 2024-12-15 LAB — BASIC METABOLIC PANEL WITH GFR
BUN/Creatinine Ratio: 11 — ABNORMAL LOW (ref 12–28)
BUN: 10 mg/dL (ref 8–27)
CO2: 21 mmol/L (ref 20–29)
Calcium: 9.9 mg/dL (ref 8.7–10.3)
Chloride: 104 mmol/L (ref 96–106)
Creatinine, Ser: 0.88 mg/dL (ref 0.57–1.00)
Glucose: 76 mg/dL (ref 70–99)
Potassium: 3.7 mmol/L (ref 3.5–5.2)
Sodium: 141 mmol/L (ref 134–144)
eGFR: 74 mL/min/1.73

## 2024-12-15 NOTE — Telephone Encounter (Signed)
 Call placed to the pt.   [facility] drew the incorrect lab specimen on the pt yesterday.  They drew a future BMET that was to be done in 6 months prior to pts CT ANGIO CHEST AORTA (per CT protocol) vs lipids and Lipo A.   Pt was to have lipids and Lipoprotein A done yesterday, for she has had at least 3 Repatha  injections.  Lab drew the bmet instead.   Explained this all to the pt.  Pt states she can come back to the lab on Friday to have her lipids and LipoA drawn.  She is aware to come fasting.   Did place new orders again for lipids and LipoA, placed future with approximation date for this week, and released accordingly.  Pt is aware I will make my RN Supervisor aware of this encounter, so that she can touch base with [facility], to help in reimbursing the pt back for incorrect lab drawn.   Pt verbalized understanding and agrees with this plan.  Pt was gracious for all the assistance provided.

## 2024-12-21 ENCOUNTER — Inpatient Hospital Stay

## 2024-12-21 ENCOUNTER — Inpatient Hospital Stay: Admitting: Oncology

## 2024-12-21 ENCOUNTER — Telehealth: Payer: Self-pay

## 2024-12-21 NOTE — Telephone Encounter (Signed)
 Followed up with patient due to missed Hematology appointment with Dr. Autumn at [facility] this afternoon. Patient apologized and said she thought it was tomorrow.

## 2024-12-23 ENCOUNTER — Ambulatory Visit (HOSPITAL_BASED_OUTPATIENT_CLINIC_OR_DEPARTMENT_OTHER): Payer: Self-pay | Admitting: Family

## 2024-12-23 DIAGNOSIS — I700 Atherosclerosis of aorta: Secondary | ICD-10-CM

## 2024-12-23 DIAGNOSIS — E782 Mixed hyperlipidemia: Secondary | ICD-10-CM

## 2024-12-23 DIAGNOSIS — Z79899 Other long term (current) drug therapy: Secondary | ICD-10-CM

## 2024-12-23 DIAGNOSIS — I350 Nonrheumatic aortic (valve) stenosis: Secondary | ICD-10-CM

## 2024-12-23 DIAGNOSIS — E7849 Other hyperlipidemia: Secondary | ICD-10-CM

## 2024-12-23 DIAGNOSIS — Z8249 Family history of ischemic heart disease and other diseases of the circulatory system: Secondary | ICD-10-CM

## 2024-12-23 LAB — LIPID PANEL
Chol/HDL Ratio: 2.6 ratio (ref 0.0–4.4)
Cholesterol, Total: 182 mg/dL (ref 100–199)
HDL: 70 mg/dL
LDL Chol Calc (NIH): 98 mg/dL (ref 0–99)
Triglycerides: 78 mg/dL (ref 0–149)
VLDL Cholesterol Cal: 14 mg/dL (ref 5–40)

## 2024-12-23 LAB — LIPOPROTEIN A (LPA): Lipoprotein (a): 266.7 nmol/L — ABNORMAL HIGH

## 2024-12-23 NOTE — Telephone Encounter (Signed)
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  LDL (bad cholesterol)  not yet at goal. Please ensure taking Rosuvastatin  40mg  daily and Repatha  140mg  q14 days.   If not taking, resume and repeat FLP in 2 months If she is taking, recommend referral to Dr. Mona in Lipid Clinic

## 2024-12-23 NOTE — Telephone Encounter (Signed)
 The patient has been notified of the result and verbalized understanding.  All questions (if any) were answered.  Pt confirmed she is taking both Repatha  and crestor  40 mg daily as prescribed with no skipped doses.   Pt is interested in being referred to see Dr. Mona in lipid clinic.   Pt aware I will place the referral in the system and have his scheduler reach out to her by phone to schedule this appt.  Advised the pt to continue her current regimen as prescribed.  Pt verbalized understanding and agrees with this plan.

## 2025-01-15 ENCOUNTER — Encounter: Payer: Self-pay | Admitting: Family Medicine

## 2025-01-15 ENCOUNTER — Ambulatory Visit: Admitting: Family Medicine

## 2025-01-15 VITALS — BP 128/82 | HR 67 | Temp 98.1°F | Ht 62.0 in | Wt 179.6 lb

## 2025-01-15 DIAGNOSIS — E890 Postprocedural hypothyroidism: Secondary | ICD-10-CM

## 2025-01-15 DIAGNOSIS — E7849 Other hyperlipidemia: Secondary | ICD-10-CM

## 2025-01-15 LAB — T4, FREE: Free T4: 0.8 ng/dL (ref 0.60–1.60)

## 2025-01-15 LAB — TSH: TSH: 0.11 u[IU]/mL — ABNORMAL LOW (ref 0.35–5.50)

## 2025-01-15 LAB — T3, FREE: T3, Free: 3 pg/mL (ref 2.3–4.2)

## 2025-01-15 NOTE — Progress Notes (Signed)
 "  Subjective  CC:  Chief Complaint  Patient presents with   Hypothyroidism    HPI: Alison [name] is a 64 y.o. female who presents to the office today to address the problems listed above in the chief complaint. Discussed the use of AI scribe software for clinical note transcription with the patient, who gave verbal consent to proceed.  History of Present Illness A 64 year old female who presents for a thyroid  check-up.  Thyroid  function monitoring - Presenting for routine thyroid  check-up - Currently on an alternate dosing regimen for thyroid  medication - lowered dose slightly due to total T4 just above normal and low TSH - on 112 mwf, 125 t,th,sa,su - No palpitations - Feels well overall  Lab Results  Component Value Date   TSH 0.011 (L) 09/25/2024  Free T4=12.7  Hyperlipidemia management - Taking Repatha  for hyperlipidemia - History of elevated cholesterol   Assessment  1. Postablative hypothyroidism   2. Familial hyperlipidemia      Plan  Assessment and Plan Assessment & Plan Postablative hypothyroidism Well-managed with alternate dosing. Free hormone levels are close to optimal. No symptoms such as palpitations reported. - Checked thyroid  hormone levels - Continue current alternate dosing regimen  HLD improved on crestor  and repath    Follow up: July 2026 for cpe Orders Placed This Encounter  Procedures   T3, free   T4, free   TSH   No orders of the defined types were placed in this encounter.    I reviewed the patients updated PMH, FH, and SocHx.  Patient Active Problem List   Diagnosis Date Noted   History of deep venous thrombosis (DVT) of distal vein of right lower extremity 11/28/2023    Priority: High   History of pulmonary embolism 11/19/2023    Priority: High   Hiatal hernia 06/24/2023    Priority: High   Class 2 severe obesity with body mass index (BMI) of 35 to 39.9 with serious comorbidity 03/02/2020    Priority: High   Postablative  hypothyroidism 02/03/2020    Priority: High   Systolic murmur 09/29/2019    Priority: High   GAD (generalized anxiety disorder) 06/24/2018    Priority: High   Familial hyperlipidemia 06/24/2018    Priority: High   Essential hypertension 05/31/2018    Priority: High   Family history of premature CAD 12/29/2013    Priority: High   History of TIA (transient ischemic attack) 12/10/1980    Priority: High   Gastroesophageal reflux disease 10/11/2020    Priority: Medium    S/P gastric sleeve procedure 10/11/2020    Priority: Medium    Mild intermittent asthma without complication 06/24/2018    Priority: Medium    Primary osteoarthritis of both knees 09/11/2016    Priority: Medium    Gastric banding status 10/07/2015    Priority: Medium    Restless legs syndrome (RLS) 02/14/2015    Priority: Medium    Insomnia 12/29/2013    Priority: Medium    Status post right knee replacement 06/24/2023    Priority: Low   Status post left knee replacement 01/04/2023    Priority: Low   Gastric polyp 03/05/2024   Mass of right breast 12/20/2023   Aortic valve calcification 12/20/2023   Healthcare maintenance 12/20/2023   Hypoalbuminemia due to protein-calorie malnutrition 11/28/2023   Subcutaneous nodule of breast 11/19/2023   Active Medications[1] Allergies: Patient is allergic to aspirin, cherry, other, nsaids, oxycodone , soy allergy (obsolete), and penicillins. Family History: Patient family  history includes Anesthesia problems in her sister; Breast cancer (age of onset: 69) in her maternal aunt; Cancer in her maternal aunt; Colon polyps in her mother; Glaucoma in her mother; Heart disease in her father; Hyperthyroidism in her mother; Migraines in her sister; Myasthenia gravis in her paternal grandfather; Ovarian cancer in her maternal grandmother; Stroke in her father; Uterine cancer in her maternal grandmother. Social History:  Patient  reports that she has never smoked. She has never used  smokeless tobacco. She reports that she does not currently use alcohol . She reports that she does not use drugs.  Review of Systems: Constitutional: Negative for fever malaise or anorexia Cardiovascular: negative for chest pain Respiratory: negative for SOB or persistent cough Gastrointestinal: negative for abdominal pain  Objective  Vitals: BP 128/82   Pulse 67   Temp 98.1 F (36.7 C)   Ht 5' 2 (1.575 m)   Wt 179 lb 9.6 oz (81.5 kg) Comment: Pt reported  SpO2 96%   BMI 32.85 kg/m  General: no acute distress , A&Ox3 HEENT: PEERL, conjunctiva normal, neck is supple  Commons side effects, risks, benefits, and alternatives for medications and treatment plan prescribed today were discussed, and the patient expressed understanding of the given instructions. Patient is instructed to call or message via MyChart if he/she has any questions or concerns regarding our treatment plan. No barriers to understanding were identified. We discussed Red Flag symptoms and signs in detail. Patient expressed understanding regarding what to do in case of urgent or emergency type symptoms.  Medication list was reconciled, printed and provided to the patient in AVS. Patient instructions and summary information was reviewed with the patient as documented in the AVS. This note was prepared with assistance of Dragon voice recognition software. Occasional wrong-word or sound-a-like substitutions may have occurred due to the inherent limitations of voice recognition software    [1]  Current Meds  Medication Sig   albuterol  (VENTOLIN  HFA) 108 (90 Base) MCG/ACT inhaler Inhale 2 puffs into the lungs every 6 (six) hours as needed. For asthma   ALPRAZolam  (XANAX ) 0.25 MG tablet TAKE 1 TABLET BY MOUTH AT BEDTIME AS NEEDED FOR ANXIETY OR SLEEP.   Blood Pressure Monitoring Soln KIT    escitalopram  (LEXAPRO ) 10 MG tablet TAKE 1 TABLET BY MOUTH EVERY DAY   Evolocumab  (REPATHA  SURECLICK) 140 MG/ML SOAJ Inject 140 mg into  the skin every 14 (fourteen) days.   famotidine  (PEPCID ) 20 MG tablet Take 1 tablet (20 mg total) by mouth at bedtime.   hydrochlorothiazide  (MICROZIDE ) 12.5 MG capsule TAKE 1 CAPSULE BY MOUTH EVERY DAY   levothyroxine  (SYNTHROID ) 112 MCG tablet Take 1 tablet (112 mcg total) by mouth every Monday, Wednesday, and Friday.   levothyroxine  (SYNTHROID ) 125 MCG tablet Take 1 tablet (125 mcg total) by mouth every Tuesday, Thursday, Saturday, and Sunday.   Multiple Vitamin (MULTIVITAMIN) capsule Take 1 capsule by mouth daily.   pantoprazole  (PROTONIX ) 40 MG tablet TAKE 1 TABLET BY MOUTH TWICE A DAY   rOPINIRole  (REQUIP ) 3 MG tablet TAKE 1 TABLET BY MOUTH AT BEDTIME.   rosuvastatin  (CRESTOR ) 40 MG tablet TAKE 1 TABLET BY MOUTH EVERYDAY AT BEDTIME   sucralfate  (CARAFATE ) 1 GM/10ML suspension TAKE 10 MLS (1 G TOTAL) BY MOUTH 4 (FOUR) TIMES DAILY FOR 28 DAYS.   traZODone  (DESYREL ) 50 MG tablet Take 0.5-1 tablets (25-50 mg total) by mouth at bedtime as needed for sleep.   "

## 2025-01-15 NOTE — Patient Instructions (Addendum)
Please return in July for your annual complete physical; please come fasting.   I will release your lab results to you on your MyChart account with further instructions. You may see the results before I do, but when I review them I will send you a message with my report or have my assistant call you if things need to be discussed. Please reply to my message with any questions. Thank you!   If you have any questions or concerns, please don't hesitate to send me a message via MyChart or call the office at [phone]. Thank you for visiting with [place] today! It's our pleasure caring for you.

## 2025-07-09 ENCOUNTER — Encounter: Admitting: Family Medicine

## 2025-07-16 ENCOUNTER — Encounter: Admitting: Family Medicine

## 2025-10-26 ENCOUNTER — Inpatient Hospital Stay

## 2025-10-26 ENCOUNTER — Inpatient Hospital Stay: Admitting: Oncology
